# Patient Record
Sex: Female | Born: 1955 | Race: Black or African American | Hispanic: No | Marital: Married | Smoking: Never smoker
Health system: Southern US, Community
[De-identification: ages and names within clinical notes are randomized; demographics above are authoritative.]

## PROBLEM LIST (undated history)

## (undated) DIAGNOSIS — E079 Disorder of thyroid, unspecified: Secondary | ICD-10-CM

## (undated) DIAGNOSIS — E785 Hyperlipidemia, unspecified: Secondary | ICD-10-CM

## (undated) DIAGNOSIS — M329 Systemic lupus erythematosus, unspecified: Secondary | ICD-10-CM

## (undated) DIAGNOSIS — I10 Essential (primary) hypertension: Secondary | ICD-10-CM

## (undated) DIAGNOSIS — I4891 Unspecified atrial fibrillation: Secondary | ICD-10-CM

## (undated) DIAGNOSIS — I272 Pulmonary hypertension, unspecified: Secondary | ICD-10-CM

## (undated) DIAGNOSIS — G4730 Sleep apnea, unspecified: Secondary | ICD-10-CM

## (undated) DIAGNOSIS — IMO0002 Reserved for concepts with insufficient information to code with codable children: Secondary | ICD-10-CM

## (undated) DIAGNOSIS — N289 Disorder of kidney and ureter, unspecified: Secondary | ICD-10-CM

## (undated) HISTORY — PX: OTHER SURGICAL HISTORY: SHX169

## (undated) HISTORY — PX: CARDIAC CATHETERIZATION: SHX172

## (undated) HISTORY — PX: ABDOMINAL HYSTERECTOMY: SHX81

## (undated) HISTORY — DX: Hyperlipidemia, unspecified: E78.5

## (undated) HISTORY — DX: Pulmonary hypertension, unspecified: I27.20

---

## 2000-07-04 ENCOUNTER — Encounter: Payer: Self-pay | Admitting: Family Medicine

## 2000-07-04 ENCOUNTER — Ambulatory Visit (HOSPITAL_COMMUNITY): Admission: RE | Admit: 2000-07-04 | Discharge: 2000-07-04 | Payer: Self-pay | Admitting: Family Medicine

## 2002-08-10 ENCOUNTER — Ambulatory Visit (HOSPITAL_COMMUNITY): Admission: RE | Admit: 2002-08-10 | Discharge: 2002-08-10 | Payer: Self-pay | Admitting: Family Medicine

## 2002-08-10 ENCOUNTER — Encounter: Payer: Self-pay | Admitting: Family Medicine

## 2006-04-23 DIAGNOSIS — G4730 Sleep apnea, unspecified: Secondary | ICD-10-CM

## 2006-04-23 HISTORY — DX: Sleep apnea, unspecified: G47.30

## 2006-09-02 ENCOUNTER — Ambulatory Visit: Payer: Self-pay | Admitting: Family Medicine

## 2006-09-05 ENCOUNTER — Encounter: Admission: RE | Admit: 2006-09-05 | Discharge: 2006-09-05 | Payer: Self-pay | Admitting: Family Medicine

## 2006-09-10 ENCOUNTER — Ambulatory Visit: Payer: Self-pay | Admitting: Family Medicine

## 2006-09-29 ENCOUNTER — Inpatient Hospital Stay (HOSPITAL_COMMUNITY): Admission: EM | Admit: 2006-09-29 | Discharge: 2006-10-03 | Payer: Self-pay | Admitting: Emergency Medicine

## 2006-10-01 ENCOUNTER — Ambulatory Visit: Payer: Self-pay | Admitting: Vascular Surgery

## 2006-10-01 ENCOUNTER — Encounter (INDEPENDENT_AMBULATORY_CARE_PROVIDER_SITE_OTHER): Payer: Self-pay | Admitting: Internal Medicine

## 2006-10-02 HISTORY — PX: CARDIAC CATHETERIZATION: SHX172

## 2006-10-11 ENCOUNTER — Emergency Department (HOSPITAL_COMMUNITY): Admission: EM | Admit: 2006-10-11 | Discharge: 2006-10-11 | Payer: Self-pay | Admitting: Emergency Medicine

## 2007-01-09 ENCOUNTER — Encounter: Admission: RE | Admit: 2007-01-09 | Discharge: 2007-01-09 | Payer: Self-pay | Admitting: Internal Medicine

## 2007-01-11 ENCOUNTER — Encounter: Admission: RE | Admit: 2007-01-11 | Discharge: 2007-01-11 | Payer: Self-pay | Admitting: Internal Medicine

## 2007-01-24 ENCOUNTER — Encounter: Admission: RE | Admit: 2007-01-24 | Discharge: 2007-01-24 | Payer: Self-pay | Admitting: Internal Medicine

## 2007-02-24 ENCOUNTER — Emergency Department (HOSPITAL_COMMUNITY): Admission: EM | Admit: 2007-02-24 | Discharge: 2007-02-24 | Payer: Self-pay | Admitting: Emergency Medicine

## 2007-04-03 ENCOUNTER — Encounter (HOSPITAL_COMMUNITY): Admission: RE | Admit: 2007-04-03 | Discharge: 2007-04-23 | Payer: Self-pay | Admitting: Cardiovascular Disease

## 2007-04-24 ENCOUNTER — Encounter (HOSPITAL_COMMUNITY): Admission: RE | Admit: 2007-04-24 | Discharge: 2007-07-23 | Payer: Self-pay | Admitting: Cardiovascular Disease

## 2007-06-02 ENCOUNTER — Observation Stay (HOSPITAL_COMMUNITY): Admission: EM | Admit: 2007-06-02 | Discharge: 2007-06-04 | Payer: Self-pay | Admitting: Emergency Medicine

## 2007-06-03 ENCOUNTER — Encounter (INDEPENDENT_AMBULATORY_CARE_PROVIDER_SITE_OTHER): Payer: Self-pay | Admitting: *Deleted

## 2007-11-05 ENCOUNTER — Emergency Department (HOSPITAL_COMMUNITY): Admission: EM | Admit: 2007-11-05 | Discharge: 2007-11-06 | Payer: Self-pay | Admitting: Emergency Medicine

## 2008-02-26 ENCOUNTER — Encounter (INDEPENDENT_AMBULATORY_CARE_PROVIDER_SITE_OTHER): Payer: Self-pay | Admitting: Obstetrics & Gynecology

## 2008-02-26 ENCOUNTER — Ambulatory Visit (HOSPITAL_COMMUNITY): Admission: RE | Admit: 2008-02-26 | Discharge: 2008-02-27 | Payer: Self-pay | Admitting: Obstetrics & Gynecology

## 2008-03-13 ENCOUNTER — Inpatient Hospital Stay (HOSPITAL_COMMUNITY): Admission: AD | Admit: 2008-03-13 | Discharge: 2008-03-13 | Payer: Self-pay | Admitting: Obstetrics and Gynecology

## 2008-03-17 ENCOUNTER — Inpatient Hospital Stay (HOSPITAL_COMMUNITY): Admission: AD | Admit: 2008-03-17 | Discharge: 2008-03-17 | Payer: Self-pay | Admitting: Obstetrics and Gynecology

## 2008-05-17 ENCOUNTER — Emergency Department (HOSPITAL_COMMUNITY): Admission: EM | Admit: 2008-05-17 | Discharge: 2008-05-17 | Payer: Self-pay | Admitting: Emergency Medicine

## 2008-10-08 ENCOUNTER — Ambulatory Visit: Payer: Self-pay | Admitting: Diagnostic Radiology

## 2008-10-08 ENCOUNTER — Emergency Department (HOSPITAL_BASED_OUTPATIENT_CLINIC_OR_DEPARTMENT_OTHER): Admission: EM | Admit: 2008-10-08 | Discharge: 2008-10-09 | Payer: Self-pay | Admitting: Emergency Medicine

## 2009-09-12 ENCOUNTER — Encounter: Payer: Self-pay | Admitting: Emergency Medicine

## 2009-09-12 ENCOUNTER — Ambulatory Visit: Payer: Self-pay | Admitting: Radiology

## 2009-09-12 ENCOUNTER — Inpatient Hospital Stay (HOSPITAL_COMMUNITY): Admission: AD | Admit: 2009-09-12 | Discharge: 2009-09-15 | Payer: Self-pay | Admitting: Cardiovascular Disease

## 2009-09-13 ENCOUNTER — Encounter (INDEPENDENT_AMBULATORY_CARE_PROVIDER_SITE_OTHER): Payer: Self-pay | Admitting: Cardiovascular Disease

## 2009-09-13 ENCOUNTER — Ambulatory Visit: Payer: Self-pay | Admitting: Vascular Surgery

## 2010-07-10 LAB — URINALYSIS, ROUTINE W REFLEX MICROSCOPIC
Glucose, UA: NEGATIVE mg/dL
Hgb urine dipstick: NEGATIVE
Nitrite: NEGATIVE
Urobilinogen, UA: 0.2 mg/dL (ref 0.0–1.0)

## 2010-07-10 LAB — BASIC METABOLIC PANEL
BUN: 13 mg/dL (ref 6–23)
BUN: 17 mg/dL (ref 6–23)
CO2: 33 mEq/L — ABNORMAL HIGH (ref 19–32)
Calcium: 8.8 mg/dL (ref 8.4–10.5)
Chloride: 110 mEq/L (ref 96–112)
Chloride: 104 mEq/L (ref 96–112)
Creatinine, Ser: 1.2 mg/dL (ref 0.4–1.2)
Creatinine, Ser: 1.3 mg/dL — ABNORMAL HIGH (ref 0.4–1.2)
GFR calc Af Amer: 57 mL/min — ABNORMAL LOW (ref >60)
GFR calc non Af Amer: 52 mL/min — ABNORMAL LOW (ref >60)
Glucose, Bld: 96 mg/dL (ref 70–99)
Potassium: 4.5 mEq/L (ref 3.5–5.1)
Potassium: 4.8 mEq/L (ref 3.5–5.1)
Sodium: 140 mEq/L (ref 135–145)
Sodium: 143 mEq/L (ref 135–145)

## 2010-07-10 LAB — POCT I-STAT 3, VENOUS BLOOD GAS (G3P V)
Bicarbonate: 23.4 mEq/L (ref 20.0–24.0)
pCO2, Ven: 41.5 mmHg — ABNORMAL LOW (ref 45.0–50.0)
pH, Ven: 7.359 — ABNORMAL HIGH (ref 7.250–7.300)
pO2, Ven: 34 mmHg (ref 30.0–45.0)

## 2010-07-10 LAB — LIPID PANEL
HDL: 39 mg/dL — ABNORMAL LOW (ref >39)
LDL Cholesterol: 56 mg/dL (ref 0–99)
Total CHOL/HDL Ratio: 2.9 RATIO
Triglycerides: 86 mg/dL (ref <150)
VLDL: 17 mg/dL (ref 0–40)

## 2010-07-10 LAB — CBC
Hemoglobin: 9.8 g/dL — ABNORMAL LOW (ref 12.0–15.0)
MCHC: 34.4 g/dL (ref 30.0–36.0)
MCV: 94.5 fL (ref 78.0–100.0)
MCV: 93.1 fL (ref 78.0–100.0)
Platelets: 263 10*3/uL (ref 150–400)
Platelets: 265 10*3/uL (ref 150–400)
Platelets: 312 10*3/uL (ref 150–400)
RBC: 3.03 MIL/uL — ABNORMAL LOW (ref 3.87–5.11)
RDW: 13.3 % (ref 11.5–15.5)
RDW: 13.4 % (ref 11.5–15.5)
WBC: 3.7 10*3/uL — ABNORMAL LOW (ref 4.0–10.5)
WBC: 3.9 10*3/uL — ABNORMAL LOW (ref 4.0–10.5)
WBC: 4 10*3/uL (ref 4.0–10.5)
WBC: 4 10*3/uL (ref 4.0–10.5)

## 2010-07-10 LAB — COMPREHENSIVE METABOLIC PANEL
ALT: 17 U/L (ref 0–35)
AST: 16 U/L (ref 0–37)
Albumin: 3.3 g/dL — ABNORMAL LOW (ref 3.5–5.2)
Alkaline Phosphatase: 46 U/L (ref 39–117)
GFR calc Af Amer: 56 mL/min — ABNORMAL LOW (ref >60)
Potassium: 3.5 mEq/L (ref 3.5–5.1)
Sodium: 141 mEq/L (ref 135–145)
Total Protein: 6.2 g/dL (ref 6.0–8.3)

## 2010-07-10 LAB — PROTIME-INR
INR: 1.21 (ref 0.00–1.49)
INR: 1.61 — ABNORMAL HIGH (ref 0.00–1.49)
INR: 1.82 — ABNORMAL HIGH (ref 0.00–1.49)
INR: 1.6 — ABNORMAL HIGH (ref 0.00–1.49)
Prothrombin Time: 19 seconds — ABNORMAL HIGH (ref 11.6–15.2)
Prothrombin Time: 20.9 seconds — ABNORMAL HIGH (ref 11.6–15.2)
Prothrombin Time: 18.9 seconds — ABNORMAL HIGH (ref 11.6–15.2)

## 2010-07-10 LAB — HEMOGLOBIN A1C: Mean Plasma Glucose: 97 mg/dL (ref <117)

## 2010-07-10 LAB — HEPARIN LEVEL (UNFRACTIONATED)
Heparin Unfractionated: 0.18 IU/mL — ABNORMAL LOW (ref 0.30–0.70)
Heparin Unfractionated: 0.37 IU/mL (ref 0.30–0.70)
Heparin Unfractionated: 0.5 IU/mL (ref 0.30–0.70)

## 2010-07-10 LAB — CARDIAC PANEL(CRET KIN+CKTOT+MB+TROPI)
CK, MB: 0.4 ng/mL (ref 0.3–4.0)
Total CK: 105 U/L (ref 7–177)
Total CK: 93 U/L (ref 7–177)
Troponin I: 0.01 ng/mL (ref 0.00–0.06)

## 2010-07-10 LAB — URINE MICROSCOPIC-ADD ON

## 2010-07-10 LAB — C-REACTIVE PROTEIN: CRP: 0.3 mg/dL — ABNORMAL LOW (ref <0.6)

## 2010-07-10 LAB — POCT I-STAT 3, ART BLOOD GAS (G3+)
Acid-base deficit: 1 mmol/L (ref 0.0–2.0)
pH, Arterial: 7.39 (ref 7.350–7.400)

## 2010-07-10 LAB — DIFFERENTIAL
Basophils Relative: 1 % (ref 0–1)
Eosinophils Absolute: 0.1 10*3/uL (ref 0.0–0.7)
Neutrophils Relative %: 53 % (ref 43–77)

## 2010-07-10 LAB — ANA: Anti Nuclear Antibody(ANA): NEGATIVE

## 2010-07-10 LAB — SEDIMENTATION RATE: Sed Rate: 19 mm/hr (ref 0–22)

## 2010-07-10 LAB — CREATININE, URINE, RANDOM: Creatinine, Urine: 193.1 mg/dL

## 2010-07-10 LAB — D-DIMER, QUANTITATIVE: D-Dimer, Quant: 0.37 ug/mL-FEU (ref 0.00–0.48)

## 2010-07-10 LAB — TSH: TSH: 1.769 u[IU]/mL (ref 0.350–4.500)

## 2010-07-31 LAB — PROTIME-INR
INR: 2.7 — ABNORMAL HIGH (ref 0.00–1.49)
Prothrombin Time: 30.3 s — ABNORMAL HIGH (ref 11.6–15.2)

## 2010-07-31 LAB — DIFFERENTIAL
Basophils Absolute: 0 K/uL (ref 0.0–0.1)
Basophils Relative: 1 % (ref 0–1)
Eosinophils Absolute: 0.1 10*3/uL (ref 0.0–0.7)
Eosinophils Relative: 1 % (ref 0–5)
Lymphocytes Relative: 44 % (ref 12–46)
Lymphs Abs: 1.8 10*3/uL (ref 0.7–4.0)
Monocytes Absolute: 0.4 10*3/uL (ref 0.1–1.0)
Monocytes Relative: 9 % (ref 3–12)
Neutro Abs: 1.8 K/uL (ref 1.7–7.7)
Neutrophils Relative %: 45 % (ref 43–77)

## 2010-07-31 LAB — URINALYSIS, ROUTINE W REFLEX MICROSCOPIC
Bilirubin Urine: NEGATIVE
Glucose, UA: NEGATIVE mg/dL
Hgb urine dipstick: NEGATIVE
Ketones, ur: NEGATIVE mg/dL
Nitrite: NEGATIVE
Protein, ur: NEGATIVE mg/dL
Specific Gravity, Urine: 1.021 (ref 1.005–1.030)
Urobilinogen, UA: 0.2 mg/dL (ref 0.0–1.0)
pH: 5.5 (ref 5.0–8.0)

## 2010-07-31 LAB — COMPREHENSIVE METABOLIC PANEL WITH GFR
ALT: 20 U/L (ref 0–35)
Albumin: 4.3 g/dL (ref 3.5–5.2)
Alkaline Phosphatase: 90 U/L (ref 39–117)
Calcium: 9.1 mg/dL (ref 8.4–10.5)
GFR calc Af Amer: 57 mL/min — ABNORMAL LOW (ref >60)
Glucose, Bld: 94 mg/dL (ref 70–99)
Potassium: 3.5 meq/L (ref 3.5–5.1)
Sodium: 141 meq/L (ref 135–145)
Total Protein: 7.9 g/dL (ref 6.0–8.3)

## 2010-07-31 LAB — CBC
HCT: 34.1 % — ABNORMAL LOW (ref 36.0–46.0)
Hemoglobin: 12.1 g/dL (ref 12.0–15.0)
MCHC: 35.3 g/dL (ref 30.0–36.0)
MCV: 90.9 fL (ref 78.0–100.0)
Platelets: 348 K/uL (ref 150–400)
RBC: 3.75 MIL/uL — ABNORMAL LOW (ref 3.87–5.11)
RDW: 13.1 % (ref 11.5–15.5)
WBC: 4.1 10*3/uL (ref 4.0–10.5)

## 2010-07-31 LAB — POCT B-TYPE NATRIURETIC PEPTIDE (BNP): B Natriuretic Peptide, POC: 7.6 pg/mL (ref 0–100)

## 2010-07-31 LAB — COMPREHENSIVE METABOLIC PANEL
AST: 27 U/L (ref 0–37)
BUN: 23 mg/dL (ref 6–23)
CO2: 32 mEq/L (ref 19–32)
Chloride: 98 mEq/L (ref 96–112)
Creatinine, Ser: 1.2 mg/dL (ref 0.4–1.2)
GFR calc non Af Amer: 47 mL/min — ABNORMAL LOW (ref >60)
Total Bilirubin: 0.4 mg/dL (ref 0.3–1.2)

## 2010-07-31 LAB — POCT CARDIAC MARKERS
CKMB, poc: 1.1 ng/mL (ref 1.0–8.0)
Myoglobin, poc: 40.7 ng/mL (ref 12–200)
Troponin i, poc: <0.05 ng/mL (ref 0.00–0.09)

## 2010-07-31 LAB — APTT: aPTT: 53 seconds — ABNORMAL HIGH (ref 24–37)

## 2010-08-07 LAB — POCT I-STAT, CHEM 8
Chloride: 103 mEq/L (ref 96–112)
HCT: 37 % (ref 36.0–46.0)
Hemoglobin: 12.6 g/dL (ref 12.0–15.0)
Potassium: 3.6 mEq/L (ref 3.5–5.1)
Sodium: 141 mEq/L (ref 135–145)

## 2010-09-05 NOTE — H&P (Signed)
NAME:  Caitlyn Fuentes, Caitlyn Fuentes NO.:  [id number]   MEDICAL RECORD NO.:  [id number]          PATIENT TYPE:  AMB   LOCATION:  [place]                           FACILITY:  [facility]   PHYSICIAN:  Maisie Fus, M.D.   DATE OF BIRTH:  Apr 21, 1956   DATE OF ADMISSION:  02/26/2008  DATE OF DISCHARGE:                              HISTORY & PHYSICAL   ADMITTING DIAGNOSES:  1. Dysfunctional uterine bleeding.  2. Chronic pelvic pain.  3. Ultrasound findings of uterine leiomyoma and adenomyosis.   The patient is a 55 year old black married female, gravida 4, para 4,  two by cesarean, two vaginally, who presented to my office in May 2009,  complaining of pelvic pain.  She also further complained of irregular  painful urination.  Additional evaluation in the office included a  pelvic ultrasound which showed a uterine leiomyoma and showed cystic  areas within the myometrium consistent with adenomyosis.  There was a  simple appearing right ovarian cyst noted at that time also.  By clinic  exam, there was no palpable enlargement of the uterus.  There was mild  left adnexal tenderness.  There was good vaginal support with a first  degree rectocele, but good anterior support.  There were no palpable  adnexal masses or rectal masses palpable and the vagina and cervix were  normal, as well as were the external genitalia.  Based on her symptoms  and the options of therapy which have been discussed are endometrial  ablation, not considered ideal for her condition, intrauterine device  and laparoscopically assisted hysterectomy.  She has chosen the latter,  wishing to have definitive intervention.  She is admitted at this time  for laparoscopically assisted vaginal hysterectomy and bilateral  salpingo-oophorectomy.   REVIEW OF SYSTEMS:  Her current review of systems is essentially  unremarkable.  There are no significant cardiopulmonary symptoms or  complaints.  She does admit to occasional  palpitations.  There are mild  GU symptoms of occasional stress urinary incontinence and urge  incontinence.  No other notable complaints.   PAST MEDICAL HISTORY:  The patient is known to have had atrial  fibrillation.  She is followed for this problem by Dr. Ellouise Newer.  Consultation with Dr. Claiborne Billings approximately 2 weeks prior to admission  revealed that in September 2009, and in February 2009, when she was seen  in the office, she was at that time in normal sinus rhythm.  Her last  echocardiogram was in July 2009, and she was found to have 50-55% left  ventricular ejection fraction.  By his consultation, he felt it was safe  to discontinue her Coumadin, which was discontinued approximately 10  days prior to this admission.  In addition, she was to discontinue her  aspirin.  Her other known medical condition is hypertension.  Her  cardiac and antihypertensive meds include aldactone/Diovan 80 mg a day,  Coreg 40 mg a day, torsemide 20 mg a day, metoclopramide 25 mg as needed  for rapid heart rate.  Additional medications include Ambien 10 mg at  bedtime as needed, Vesicare 10 mg a day for irritable bladder  and  meclizine 25 mg p.r.n.  She also has alprazolam or Xanax 0.5 mg which  she takes on a p.r.n. basis.  She is not a cigarette smoker.  NO KNOWN  ALLERGIES TO MEDICATIONS.  She is a one-to-two drinks per day alcohol  user.   FAMILY HISTORY:  Noncontributory.   PHYSICAL EXAMINATION:  HEENT:  Grossly within normal limits.  VITAL SIGNS:  Blood pressure in the office was 112/78.  NECK:  Thyroid gland is not palpably enlarged to my examination.  CHEST:  Clear to auscultation throughout.  HEART:  At the time of my exam was normal sinus rhythm, no audible  murmurs, rubs or gallops.  BREASTS:  Considered normal and remarkable for augmentation mammoplasty.  ABDOMEN:  Soft, moderately obese without tenderness, without appreciable  organomegaly or palpable masses.  EXTREMITIES:  Without  clubbing, cyanosis or edema.  PELVIC:  Findings are described above.   ASSESSMENT:  1. Uterine leiomyoma.  2. 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  3. Chronic pelvic pain.  4. Dysfunctional uterine bleeding.   PLAN:  Laparoscopically assisted vaginal hysterectomy, bilateral  salpingo-oophorectomy.  The patient has reviewed an ACOG brochure on the  procedure, including potential risks.  This has been discussed with her  and all questions answered.  Those risks include risk of infection, risk  of intraoperative or postoperative hemorrhage, risk of injury to other  pelvic or abdominal organs.  She also understands that on occasion the  procedure must be converted to an abdominal procedure.  She is now  admitted to proceed with surgery.      Maisie Fus, M.D.  Electronically Signed     WRN/MEDQ  D:  02/25/2008  T:  02/25/2008  Job:  [id number]

## 2010-09-05 NOTE — Discharge Summary (Signed)
NAME:  Caitlyn Fuentes, Caitlyn Fuentes           ACCOUNT NO.:  [id number]   MEDICAL RECORD NO.:  [id number]          PATIENT TYPE:  INP   LOCATION:  3028                         FACILITY:  [facility]   PHYSICIAN:  Pramod P. Leonie Man, MD    DATE OF BIRTH:  Jan 13, 1956   DATE OF ADMISSION:  06/02/2007  DATE OF DISCHARGE:  06/04/2007                               DISCHARGE SUMMARY   DIAGNOSES AT TIME OF DISCHARGE:  1. Transient left-sided weakness and aphasia, now resolved in the      setting of negative MRI.  Etiology unknown, though likely stress/      conversion reaction.  2. History of paroxysmal atrial fibrillation on Coumadin prior to      admission.  3. Hypertension.  4. Congestive heart failure.  5. Fibromyalgia.  6. Nonischemic cardiomyopathy, unchanged since July of 2008.  7. Obstructive sleep apnea with CPAP at h.s.   MEDICINES AT TIME OF DISCHARGE:  1. Aldactone 25 mg a day.  2. Coreg 25 mg b.i.d.  3. Diovan 80 mg q.h.s.  4. Torsemide 20 mg q.a.m.  5. Coumadin 10.5 mg q.p.m.  6. Vesicare 10 mg q.a.m.  7. Ambien 10 mg p.r.n. q.h.s.  8. Meclizine 25 mg t.i.d. p.r.n.  9. Reglan 10 mg q.4h. p.r.n.  10.Lanoxin 0.25 mg a day.  11.Aspirin 81 mg a day.   STUDIES PERFORMED:  1. CT of brain on admission shows no acute abnormality.  2. MRI of the brain shows no acute stroke.  3. MRA of the brain shows no acute abnormality, no stenosis.  4. EKG shows normal sinus rhythm.  5. Carotid Doppler shows proximal ICA with 40-60% stenosis per      velocity but no plaque to support morphology.  No significant      stenosis.  6. Transcranial Doppler performed; results pending.  7. 2-D echocardiogram shows EF of 35-45% with no source of embolus;      unchanged since July of 2008.  8. EEG with no seizure activity.   LABORATORY STUDIES:  CBC with hemoglobin 10.7, hematocrit 31.3;  otherwise normal.  Chemistry with creatinine 1.2.  Coags with INR of 3.0  on admission, 3.4 at discharge.  Cholesterol 170,  triglycerides 212, HDL  24, LDL 104.  Homocystine 19.8.  Cardiac enzymes with CK-MB less than 1;  otherwise normal.  Alcohol level less than 5.  Chemistry not performed  other than the creatinine.   HISTORY OF PRESENT ILLNESS:  Caitlyn Fuentes is a 55 year old  right-handed African-American female with history of multiple medical  problems including fibromyalgia who presents with acute onset of  dysphagia and dysarthria at 1:30 p.m. the day of admission, followed by  general weakness.  The patient states she felt more heavy on her left  than her right.  Her symptoms gradually improved then resolved, except  for some slight left-sided numbness.  The patient also complained of  lightheadedness.  She was within the TPA window, but secondary to  symptom resolution she was not a candidate.  She was admitted to the  hospital for further stroke evaluation.   HOSPITAL COURSE:  MRI was  negative for acute stroke.  The patient does  have a history of paroxysmal atrial fibrillation and was on Coumadin  prior to admission.  She had an INR of 3.1.  Her episodes of aphasia and  dysarthria occurred while at work during a meeting. but were transient  and lasted only a few minutes.  The story was not consistent with a  stroke or TIA-sounding symptoms.  Of note, the patient has a family  history of Huntington's and constantly referred her symptoms back to  that disease process.  Complete stroke work up was done including EEG  that ruled out seizures.  Aspirin was added for stroke prevention.  In  hospital, the patient had right bundle branch block and a run of  bigeminy.  The cardiologist was asked to evaluate her, as she has had  cardiac abnormalities in the past.  [facility] saw her and  felt her 2-D echocardiogram was unchanged since July of 2008.  She had  complete normal sinus rhythm and no other abnormalities.  She was safe  to be discharged home from their standpoint and followed  up as an  outpatient.  They did recommend to decrease her Diovan secondary to  slightly low blood pressure, and that was done.  The patient has no PT,  OT needs and was safe for discharge home.  She may return to work on  Monday.  She has no neuro follow up needed.   CONDITION ON DISCHARGE:  The patient alert and oriented x3.  No diffuse  weakness, no aphasia, no dysarthria.  Gait steady.   DISCHARGE/PLAN:  1. Discharged home with family.  2. Follow up with primary care physician within 1 month.  3. Follow up with outpatient cardiology per their recommendation.  4. Encourage stress relaxation techniques.  5. No neurologic follow up needed.      Burnetta Sabin, N.P.    ______________________________  Kathie Rhodes. Leonie Man, MD    SB/MEDQ  D:  06/04/2007  T:  06/05/2007  Job:  [id number]   cc:   Barbette Merino, M.D.  Shelva Majestic, M.D.

## 2010-09-05 NOTE — Discharge Summary (Signed)
NAMEMarland Fuentes  [name], [name] NO.:  [id number]   MEDICAL RECORD NO.:  [id number]          PATIENT TYPE:  INP   LOCATION:  [id number]                         FACILITY:  [facility]   PHYSICIAN:  Sharlet Salina, M.D.   DATE OF BIRTH:  [birth year]   DATE OF ADMISSION:  09/29/2006  DATE OF DISCHARGE:  10/03/2006                               DISCHARGE SUMMARY   DISCHARGE DIAGNOSIS:  1. Near syncope.  2. New onset atrial fibrillation.  3. Hypertension.  4. Fibromyalgia.  5. Goiter.   DISCHARGE MEDICATIONS:  1. Coumadin 5 mg daily.  2. Cymbalta 60 mg daily.  3. Lisinopril 5 mg daily.  4. Coreg 3.125 mg twice daily.   PROCEDURES:  1. Cardiac catheterization that showed normal coronaries with a      decreased ejection fraction.  2. A  2-D echocardiogram that showed an ejection fraction of 35-45%.  3. Carotid Dopplers negative for ICA stenosis.   CONSULTANTS:  Cardiology. [facility], Dr. Claiborne Billings.  See HPI.   FOLLOWUP APPOINTMENTS:  The patient to follow up on Monday for INR check  and to follow up with Dr. Gwenlyn Found.  Office will call her with an  appointment.   HISTORY OF PRESENT ILLNESS:  The patient is a 55 year old female.  Was  at a bowling alley and after bowling tonight had a  near syncopal  episode.  According to the patient, she was bowling and was very hot and  her clothes soaking wet from sweating.  Went to the bathroom and felt  faint.  She states that she walked back to the outside and spoke to her  husband and according to her husband she had an  episode where she  almost passed out. See H&P for details.   Past medical history, family history, social history, meds, allergies,  review of systems per admission H&P.   PHYSICAL EXAMINATION ON DISCHARGE:  VITAL SIGNS:  Temperature 97.7,  pulse 75, respirations 20, blood pressure 126/88, pulse ox 97 and on  room air.  HEENT:  Head is normocephalic, atraumatic.  Pupils reactive to light  without  erythema.  CARDIOVASCULAR:  Regular rhythm.  LUNGS:  Clear bilaterally.  ABDOMEN:  Positive bowel sounds without edema.   HOSPITAL COURSE:  1. Near syncopal episode:  The patient was admitted to the hospital      and was noted to be in atrial fibrillation.  She was monitored on      the monitor.  Cardiology was consulted.  She had cardiac      catheterization that showed clean coronaries, but decrease in LV      function, most likely secondary to hypertension.  The patient was      discharged home on heart failure therapy with ACE inhibitor, beta      blocker and Coumadin for her atrial fibrillation.  2. Hypertension:  As above, the patient was treated with Coreg and      lisinopril for blood pressure.  3. Fibromyalgia:  She was continued on Cymbalta.   DISCHARGE LABORATORY DATA:  Sodium 138, potassium 3.7, chloride 106, CO2  25, glucose 92, BUN  10, creatinine 0.86.  PT 14.8, INR 1.1, WBC 2.9,  hemoglobin 11.8, platelets 360,000.  Magnesium 2.1, TSH 1.713.  Cardiac  enzymes negative.  CK 159. MB 1.9, relative index 1.2, troponin-I  0.03.  D-dimer 0.59.   CT angiogram of the chest showed no evidence of pulmonary emboli,  suboptimal opacification of the pulmonary arteries. Normal  thoracic  aorta.  No acute pulmonary findings.      Sharlet Salina, M.D.  Electronically Signed     N[place]/MEDQ  D:  10/03/2006  T:  10/04/2006  Job:  [id number]   cc:   Quay Burow, M.D.  Barbette Merino, M.D.

## 2010-09-05 NOTE — Procedures (Signed)
EEG NUMBER:  [id number]   CLINICAL HISTORY:  This is a 55 year old lady with episodes of syncope  with left-sided weakness.   MEDICATION:  Listed as aspirin, Coumadin, Aldactone, Coreg, Avapro,  Demadex, Enablex, Ambien, Reglan and Tylenol.   TECHNICAL DESCRIPTION:  This is a routine 17-channel EEG recorded with  the patient awake and asleep using standard 10/20 electrode placement.   Background awake rhythm consists of 9- to 10-Hz alpha which is of  moderate amplitude, synchronous, reactive to eye-opening and closure.  No paroxysmal epileptiform activity, spikes or sharp waves are seen.  Stages of light sleep are only seen and show no significant  abnormalities.  Hyperventilation and photic stimulation are both  unremarkable.  Length of the recording is 23.4 minutes.  Technical  component is average.  EKG tracing reveals regular sinus rhythm.   IMPRESSION:  This EEG performed during awake and light sleep states is  within normal limits.  No definite epileptiform features were noted.           ______________________________  Kathie Rhodes. Leonie Man, MD     [id number]  D:  06/03/2007 18:48:50  T:  06/05/2007 09:55:21  Job #:  [id number]

## 2010-09-05 NOTE — H&P (Signed)
NAMEMarland Kitchen  JAZZLEEN, SCHINK NO.:  [id number]   MEDICAL RECORD NO.:  [id number]          PATIENT TYPE:  INP   LOCATION:  3028                         FACILITY:  [facility]   PHYSICIAN:  Shaune Pascal. Champey, M.D.DATE OF BIRTH:  [birth year]   DATE OF ADMISSION:  06/02/2007  DATE OF DISCHARGE:                              HISTORY & PHYSICAL   REASON FOR ADMISSION:  Stroke.   REQUESTING PHYSICIANS:  Dr. Zenia Resides   HISTORY OF PRESENT ILLNESS:  Ms. Alamin is a 55 year old African-  American female with multiple medical problems who presents with acute  onset of aphagia and dysarthria starting around 1:30 p.m. today; this  was followed by general weakness, and patient felt more heavy on her  left side than her right.  Her symptoms gradually improved and resolved,  but the patient still has some slight left-sided numbness.  She denies  any headache, vision changes,  swallowing problems, chewing problems, or  loss of consciousness.  Her speech is back to normal.  Patient also  complains of  lightheadedness today as well.   PAST MEDICAL HISTORY:  Positive for hypertension, CHF, atrial  fibrillation, fibromyalgia.   CURRENT MEDICATIONS:  Coumadin, aldactone, carvedilol, Diovan, digoxin,  Vesicare, meclizine, Ambien, and Reglan.   ALLERGIES:  No known drug allergies.   FAMILY HISTORY:  Positive for heart disease, diabetes, hypertension.   SOCIAL HISTORY:  The patient lives with her husband.  Denis any tobacco  or alcohol or drug use.   REVIEW OF SYSTEMS:  Positive as per HPI.  Negative as per HPI  and  greater than 6 other organ systems.   PHYSICAL EXAMINATION:  VITAL SIGNS:  Blood pressure 134/56.  Pulse 66.  Respirations 26.  O2 sats 100%.  HEENT:  Normocephalic, atraumatic.  Extraocular movements intact.  Pupils equal round and react to light.  NECK:  Supple.  HEART:  Regular.  LUNGS:  Clear.  ABDOMEN:  Soft, nontender.  EXTREMITIES:  No edema, good pulses.  NEUROLOGIC:   The patient is awake, alert, and oriented.  Language is  fluent. The patient follows commands appropriately.  Cranial nerves II  through XII are grossly intact.  Motor examination shows 4+ to 5/5  strength throughout except for patient has slight left lower extremity  weakness at 4 to 4+/5 strength.  Patient has a little slight drift in  the left lower extremity.  SENSORY:  The patient has decreased sensation  to pinprick in the left upper extremity and face when compared to the  right.  Reflexes are trace to 1+ throughout and symmetric.  CEREBELLAR FUNCTION:  No ataxia.  Gait was not assessed secondary to  safety.   LABORATORY DATA:  PT is 32.2, INR 3.0, PTT 46.6.   CT of the head showed no acute abnormalities.   ASSESSMENT/PLAN:  The patient is a 55 year old African-American female  with transient aphasia, which has resolved; left-sided weakness/numbness  which greatly improved.  Her NIH stroke scale is 2.  The patient is not  a candidate for IV t-PA.  Her symptoms are greatly improved.  We will  admit the patient to  stroke  MD service.  We will complete a stroke  workup.  We will continue her on Coumadin with goal of INR of 2-3.  We  will add a baby aspirin per day to her Coumadin, as her INR is  therapeutic.  We will get PT and OT consults and continue her on her  other home medications.      Shaune Pascal. Estella Husk, M.D.  Electronically Signed     DRC/MEDQ  D:  06/02/2007  T:  06/03/2007  Job:  [id number]

## 2010-09-05 NOTE — Op Note (Signed)
NAME:  Caitlyn Fuentes, Caitlyn Fuentes NO.:  [id number]   MEDICAL RECORD NO.:  [id number]          PATIENT TYPE:  OIB   LOCATION:  9313                          FACILITY:  [facility]   PHYSICIAN:  Maisie Fus, M.D.   DATE OF BIRTH:  1955-07-27   DATE OF PROCEDURE:  02/26/2008  DATE OF DISCHARGE:                               OPERATIVE REPORT   PREOPERATIVE DIAGNOSES:  Uterine leiomyomata, probable uterine  adenomyosis, clinical symptoms of chronic pelvic pain and dysfunctional  uterine bleeding.   POSTOPERATIVE DIAGNOSES:  Uterine leiomyomata, probable uterine  adenomyosis, clinical symptoms of chronic pelvic pain and dysfunctional  uterine bleeding.   OPERATIVE PROCEDURE:  Laparoscopic-assisted vaginal hysterectomy,  bilateral salpingo-oophorectomy.   SURGEON:  Maisie Fus, M.D.   ASSISTANT:  Erik Obey. Grewal, M.D.   ESTIMATED INTRAOPERATIVE BLOOD LOSS:  150 mL.   ANESTHESIA:  General endotracheal.   INTRAOPERATIVE COMPLICATIONS:  None.   Details of present illness recorded the admission note.  The patient was  admitted on the morning of surgery.   DESCRIPTION OF PROCEDURE:  She was given a gram of Ancef IV.  She was  placed in PAS compression hose.  She was brought to the operating room  and there placed under adequate general endotracheal anesthesia, placed  in dorsal lithotomy position using the Allen stirrup system.  Betadine  prep using scrub followed by a solution was done of the abdomen,  perineum, mons and vagina.  Bladder was evacuated with sterile  technique.  A Hulka tenaculum was attached to the cervix under direct  visualization.  Sterile drapes were applied.  Two small incisions were  made in the abdomen, one at the umbilicus and one just above the  symphysis in the midline.  The upper incision was used for placement of  an 11-mm bladed disposable trocar which was done without injury while  elevating the anterior abdominal wall manually.  Direct  inspection did  reveal adequate placement.  Pneumoperitoneum was allowed to accumulate  using carbon dioxide gas.  A 5 mm trocar was placed through the lower  incision.  A blunt probe was used to carefully explore the pelvis and  the abdomen.  There were no apparent abnormalities in the upper abdomen  including a normal-appearing liver and appendix.  The only abnormality  noted in the pelvis was the enlarged uterus.  There were segments of  fallopian tubes missing consistent with previous tubal ligation.  Using  a spring-loaded grasping forceps, the right adnexa was elevated.  The  Enseal device was then used to progressively develop the  infundibulopelvic ligament, the round ligament, the upper broad ligament  to a level down to the uterine artery.  The left side was treated  identically.  Attention was then turned vaginally.  Posterior weighted  vaginal retractor was placed.  Hulka tenaculum was replaced with a  Jacobs tenaculum.  Deaver retractors were used for exposure anterior and  laterally.  Colpotomy incision was made while tenting the mucosa  posterior to the cervix.  The cervix was then circumscribed with a  scalpel to release the mucosa.  [facility] posterior  weighted retractor  was placed.  The LigaSure system was then used to seal and divide the  uterosacral pedicles and bladder pillars.  Bladder was carefully  advanced off the cervix.  Cardinal ligament pedicles were taken, sealed  and divided with LigaSure.  Anterior peritoneum was entered.  Vessel  pedicles were then taken sealed and divided with LigaSure and pedicle  just above the vessels on either side was then secured, sealed and  divided with LigaSure.  This allowed delivery of the uterus through the  vaginal introitus with tubes and ovaries attached.  Angles of vagina  were then anchored to uterosacrals with mattress suture of 0 Monocryl.  Cuff was closed vertically with figure-of-eights of 0 Monocryl.  Foley   catheter was placed.  Reinspection was then carried out abdominally  through the laparoscope using the Ne[place]hat irrigation system.  Hemostasis  in the pelvis was complete.  All pack, needle and instrument counts were  correct.  Gas was allowed escape from the abdomen.  The irrigating  solution was aspirated.  The instruments removed.  The skin incisions  were closed with interrupted subcuticular sutures of 3-0 Dexon.  Steri-  Strips were also applied to the lower incision.  The incisions were  injected with 0.25% plain Marcaine for postoperative analgesia.  The  patient was awakened, taken to recovery in good condition.      Maisie Fus, M.D.  Electronically Signed     WRN/MEDQ  D:  02/26/2008  T:  02/26/2008  Job:  [id number]

## 2010-09-05 NOTE — H&P (Signed)
NAME:  Caitlyn Fuentes, Caitlyn Fuentes NO.:  [id number]   MEDICAL RECORD NO.:  [id number]          PATIENT TYPE:  EMS   LOCATION:  MAJO                         FACILITY:  [facility]   PHYSICIAN:  Leana Gamer, MDDATE OF BIRTH:  Dec 24, 1955   DATE OF ADMISSION:  09/29/2006  DATE OF DISCHARGE:                              HISTORY & PHYSICAL   CHIEF COMPLAINT:  Near syncope.   HISTORY OF PRESENT ILLNESS:  This is a 55 year old lady who won at the  bowling alley and after bowling tonight had a near syncopal episode.  According to the patient, she was bowling and was very hot with her  clothes soaking wet from sweating, went to the bathroom and felt faint.  She states that she walked back to the outside counter area and spoke to  her husband and according to her husband she had an episode where she  almost passed out, but was speaking throughout the entire episode.  The  patient does not recall some parts of that, but does recall having a  cold compress being placed on her forehead and that she was able to  regain her orientation.  Her husband states that it was rather hot in  the bowling alley tonight and that there was another patron who had an  episode of fainting.  Congruent to that, the patient states that over  about a year and a half she has had episodes of fast heart rates  intermittently.  She was brought to the emergency room and during her  workup in the emergency room was found to have paroxysmal atrial  fibrillation with controlled heart rate.  Please note that the patient  also has recently been started on Cymbalta for her fibromyalgia.   PAST MEDICAL HISTORY:  Significant for:  1. Fibromyalgia.  2. Goiter.   FAMILY HISTORY:  Significant for:  1. Hypertension.  2. Diabetes.   PAST SURGICAL HISTORY:  None.   SOCIAL HISTORY:  The patient smokes about 6 to 8 cigarettes a day for  about 12 years.  She uses alcohol only occasionally and socially and she  denies any  drug use.   CURRENT MEDICATIONS:  Include Cymbalta 60 mg daily.   ALLERGIES:  NO KNOWN DRUG ALLERGIES.   PRIMARY CARE PHYSICIAN:  Jill Alexanders, M.D.   REVIEW OF SYSTEMS:  Fourteen systems were reviewed, all systems are  negative except as noted in the HPI.  Please note that the patient has  chronic myalgia due to her fibromyalgia.   LABORATORY DATA:  In the emergency room the patient had the following  studies done.  A hemogram shows a white blood cell count of 8.4,  hemoglobin was 13.7, hematocrit of 40.9, platelet count 400.  Sodium was  142, potassium 3.7, chloride of 108, bicarb 25, BUN 9, creatinine 1.08.  She has a troponin of less than 0.05.  A D-dimer mildly elevated at  0.59.   A chest CT angiogram was negative for a pulmonary embolus.   PHYSICAL EXAMINATION:  VITAL SIGNS:  The patient has a blood pressure of  122/73, heart rate of 84, respiratory rate is  16, O2 sat of 98% on room  air.  HEENT:  The patient is normocephalic and atraumatic.  Pupils equal,  round, reactive to light and accommodation.  Extraocular movements are  intact.  Tympanic membranes are translucent bilaterally with good  landmarks.  Oropharynx is moist, no exudate, erythema, or lesions are  noted.  NECK:  Trachea is midline.  The patient has a mild fullness of the  thyroid gland.  There are no distinct masses.  No jugular venous  distention.  No carotid bruits.  RESPIRATORY EXAMINATION:  The patient has a normal respiratory effort.  Equal excursion bilaterally.  No wheezes, rales, or rhonchi noted.  CARDIOVASCULAR:  She has a normal S1, S2.  No murmurs, rubs, or gallop  noted.  PMI is nondisplaced.  No heaves or thrills on palpation.  ABDOMEN:  Obese.  She has got normoactive bowel sounds.  Abdomen is  soft, nontender, nondistended.  No masses, no hepatosplenomegaly noted.  LYMPH NODE SURVEY:  The patient has no cervical, axillary, or inguinal  lymphadenopathy noted.  However, please note that the  patient does have  some subcutaneous nodules diffusely over the body.  NEUROLOGICAL:  The patient has no focal neurological deficits.  DTRs are  2+ bilaterally upper and lower extremities.  Sensation is intact to  light touch.  Pin prick and proprioception.  Strength is 5/5  bilaterally.  Upper and lower extremities.  PSYCHIATRIC:  She is alert and oriented x3.  She has normal cognition  and insight.  Good remote and recent recall.   ASSESSMENT AND PLAN:  This is a patient who came with a near syncopal  episode.  I feel that the patient's near syncopal episode would be  largely in part due to heat exhaustion associated with the ambient  temperatures as well as core temperature secondary to patient having  been bowling.  However, I do think it is fortuitous for the patient that  she is being evaluated, because it appears she has paroxysmal atrial  fibrillation which is untreated and less contributory to this episode.  In light of her near syncopal episode and her paroxysmal atrial  fibrillation, the patient is being brought on as an observation basis  and risk factors will be assessed for this patient in terms of her  relationship to risk of stroke.  The patient will have a carotid duplex  done, a 2D echo performed, and she will be started on Coumadin.  At this  point her heart rate is controlled.  I am unsure that there is any  necessity to start her on a rate controlled medication.  The patient  does report that she has had several readings of blood pressures greater  than 140/90 and is concerned that she may have hypertension.  Her  syncopal blood pressure here was 122/73 and a repeat blood pressure was  138/81.  We will continue to monitor her blood pressure and institute  therapy as necessary based upon findings.  In light of her near syncopal  episode, I will also stop her Cymbalta as the patient is also reporting that she is having night time terrors, which may be associated with  the  Cymbalta and Cymbalta is known to have syncopes and adverse effects  associated with it.  Last medical problem is tobacco use disorder.  The  patient has been counseled against further tobacco use and is a pre-  contemplated state at this time.      Leana Gamer, MD  Electronically Signed     MAM/MEDQ  D:  09/29/2006  T:  09/29/2006  Job:  [id number]

## 2010-09-05 NOTE — Cardiovascular Report (Signed)
NAMEMarland Kitchen  BAKER, YAUCH NO.:  [id number]   MEDICAL RECORD NO.:  [id number]          PATIENT TYPE:  INP   LOCATION:  [id number]                         FACILITY:  [facility]   PHYSICIAN:  Octavia Heir, MD  DATE OF BIRTH:  09-10-1955   DATE OF PROCEDURE:  10/02/2006  DATE OF DISCHARGE:                            CARDIAC CATHETERIZATION   PROCEDURE:  1. Left heart catheterization.  2. Coronary artery.  3. Left ventriculogram.   CARDIOLOGIST:  Octavia Heir, MD   COMPLICATIONS:  None.   INDICATIONS:  Ms. Hauschild is a 55 year old female, patient of  [facility] and Dr. Ellouise Newer with a history of diabetes and hypertension  who was admitted on September 29, 2006 with an episode of presyncope where she  became diaphoretic and lightheaded.  She was subsequently found to have  paroxysmal atrial fibrillation in the ER.  She is now brought for  cardiac catheterization to rule out CAD prior to initiation of long-term  anticoagulation.  Of note, she is noted to have an abnormal EKG with LVH  with probable strain though ischemia cannot be excluded.   DESCRIPTION OF OPERATION:  After giving informed consent the patient was  brought to the cardiac cath lab and right groin was shaved, prepped and  draped in the usual sterile fashion.  After anesthesia and monitoring  was established, using modified Seldinger technique a #6 French anterior  sheath into the right femoral artery.  A 6-French diagnostic catheter  was used to perform diagnostic angiography.   FINDINGS:  1. Left main is a large vessel with no significant disease.  2. The LAD is a large vessel coursing back to one diagonal branch.      The LAD has no significant disease.  3. First diagonal is a medium-to-large size vessel which has no      significant disease.  4. The left coronary artery has a large ramus intermedius which      bifurcates in the diagonal with no significant disease.  5. The left circumflex is a large  vessel coursing to the AV groove      and gives one obtuse marginal branch.  6. The AV circumflex has no significant disease.  7. The first OM is a medium-sized vessel which bifurcates in the mid      segment with no significant disease.  8. The right coronary artery is a large dominant and gives rise to the      PDA and posterolateral branch.  There is no significant disease in      the RCA, PDA or posterolateral branch.  9. Left ventriculogram reveals mildly depressed EF of 40% with global      hypokinesis.   HEMODYNAMIC RESULTS:  Arterial pressure 100/56, LV pressure was 102/1  and LVP of 4.   CONCLUSIONS:  1. No significant CAD.  2. Mild LV systolic function.      Octavia Heir, MD  Electronically Signed     RHM/MEDQ  D:  10/02/2006  T:  10/03/2006  Job:  [id number]   cc:   [facility]  Shelva Majestic, M.D.

## 2010-09-08 NOTE — Discharge Summary (Signed)
NAME:  Caitlyn Fuentes, Caitlyn Fuentes NO.:  [id number]   MEDICAL RECORD NO.:  [id number]          PATIENT TYPE:  OIB   LOCATION:  [id number]                          FACILITY:  [facility]   PHYSICIAN:  Maisie Fus, M.D.   DATE OF BIRTH:  1956/02/13   DATE OF ADMISSION:  02/26/2008  DATE OF DISCHARGE:  02/27/2008                               DISCHARGE SUMMARY   DISCHARGE DIAGNOSES:  1. Uterine leiomyomata.  2. Uterine adenomyosis.  3. Benign hemorrhagic corpus luteal cysts of ovaries.  4. Clinical symptoms of dysfunctional uterine bleeding and chronic      pelvic pain.   OPERATIVE PROCEDURE:  Laparoscopic assisted vaginal hysterectomy with  bilateral salpingo-oophorectomy.   INTRAOPERATIVE AND POSTOPERATIVE COMPLICATIONS:  None.   DISPOSITION:  The patient was in satisfactory and improved condition at  the time of her discharge.  Of note, was a rash on her face possibly  consistent with lupus for which she had not been previously treated.  Arrangements have been made for her diagnostic evaluation of this  condition following discharge.  From a GYN standpoint, she is to have  aggressive increasing physical activity, but no vaginal entry.  She is  to call for fever, heavy bleeding, or severe pain.  She is to return to  the office in 2 weeks for postoperative followup.  She has narcotic pain  medications as part of her routine meds which is to continue at home as  needed as well as resuming all of her regular medications taken  preoperatively, and was restarted on her Coumadin.  She was given  Percocet 5/325 to be taken as needed for postoperative pain and Zofran 4  mg oral dissolving tablets to be taken as needed for nausea.   Details of present illness, past history, family history, review systems  and physical exam recorded in the admission note.   PHYSICAL FINDINGS:  On admission was remarkable for the skin rash noted  above, also for the pelvic findings with enlargement of the  uterus and  ultrasound findings were consistent with adenomyosis and leiomyomata.  She was treated perioperatively with an IV antibiotic and with PAS hose,  above-described laparoscopic assisted surgery was completed without any  intraoperative complications.  Postoperatively, her condition was  satisfactory and she was ambulating without assistance, tolerating a  regular diet, having adequate bowel and bladder function on the first  postoperative day.  She had been afebrile throughout at the time of her  admission.  She was discharged home with disposition as noted above.      Maisie Fus, M.D.  Electronically Signed     WRN/MEDQ  D:  04/09/2008  T:  04/10/2008  Job:  [id number]

## 2011-01-12 LAB — CBC
Hemoglobin: 10.7 — ABNORMAL LOW
RBC: 3.38 — ABNORMAL LOW
WBC: 5.8

## 2011-01-12 LAB — I-STAT 8, (EC8 V) (CONVERTED LAB)
Acid-Base Excess: 1
HCT: 34 — ABNORMAL LOW
Operator id: 151321
Potassium: 3.7
Sodium: 138
TCO2: 28
pH, Ven: 7.399 — ABNORMAL HIGH

## 2011-01-12 LAB — DIFFERENTIAL
Basophils Relative: 0
Lymphocytes Relative: 26
Lymphs Abs: 1.5
Monocytes Absolute: 0.6
Monocytes Relative: 10
Neutro Abs: 3.6
Neutrophils Relative %: 62

## 2011-01-12 LAB — APTT: aPTT: 47 — ABNORMAL HIGH

## 2011-01-12 LAB — POCT CARDIAC MARKERS
Operator id: 151321
Troponin i, poc: <0.05

## 2011-01-12 LAB — LIPID PANEL
HDL: 24 — ABNORMAL LOW
VLDL: 42 — ABNORMAL HIGH

## 2011-01-12 LAB — PROTIME-INR
INR: 3 — ABNORMAL HIGH
INR: 3.4 — ABNORMAL HIGH
Prothrombin Time: 32.9 — ABNORMAL HIGH

## 2011-01-12 LAB — HOMOCYSTEINE: Homocysteine: 19.8 — ABNORMAL HIGH

## 2011-01-12 LAB — ETHANOL: Alcohol, Ethyl (B): <5

## 2011-01-12 LAB — POCT I-STAT CREATININE: Creatinine, Ser: 1.2

## 2011-01-19 LAB — URINALYSIS, ROUTINE W REFLEX MICROSCOPIC
Bilirubin Urine: NEGATIVE
Glucose, UA: NEGATIVE
Hgb urine dipstick: NEGATIVE
Ketones, ur: NEGATIVE
Nitrite: NEGATIVE
Specific Gravity, Urine: 1.012
pH: 5

## 2011-01-19 LAB — CBC
HCT: 34.1 — ABNORMAL LOW
Hemoglobin: 11.6 — ABNORMAL LOW
MCHC: 34.1
MCV: 90.4
RBC: 3.77 — ABNORMAL LOW
RDW: 13.9

## 2011-01-19 LAB — POCT CARDIAC MARKERS
CKMB, poc: 1
Myoglobin, poc: 115
Myoglobin, poc: 99.9
Operator id: 133351

## 2011-01-19 LAB — DIFFERENTIAL
Basophils Absolute: 0
Basophils Relative: 1
Eosinophils Absolute: 0.1
Eosinophils Relative: 2
Monocytes Absolute: 0.3
Monocytes Relative: 7

## 2011-01-19 LAB — POCT I-STAT, CHEM 8
BUN: 29 — ABNORMAL HIGH
Creatinine, Ser: 1.5 — ABNORMAL HIGH
Glucose, Bld: 118 — ABNORMAL HIGH
Hemoglobin: 12.2
TCO2: 28

## 2011-01-23 LAB — PROTIME-INR
Prothrombin Time: 14.2
Prothrombin Time: 32.6 — ABNORMAL HIGH

## 2011-01-23 LAB — COMPREHENSIVE METABOLIC PANEL
BUN: 12
CO2: 31
Calcium: 9.1
Creatinine, Ser: 0.99
GFR calc non Af Amer: 59 — ABNORMAL LOW
Glucose, Bld: 103 — ABNORMAL HIGH
Total Bilirubin: 0.8

## 2011-01-23 LAB — CBC
HCT: 25.9 — ABNORMAL LOW
Hemoglobin: 9 — ABNORMAL LOW
Hemoglobin: 9.5 — ABNORMAL LOW
MCHC: 33.4
MCHC: 33.7
MCHC: 34
MCHC: 34.8
MCV: 92.4
MCV: 93.3
Platelets: 375
Platelets: 499 — ABNORMAL HIGH
RBC: 2.8 — ABNORMAL LOW
RBC: 3.08 — ABNORMAL LOW
RDW: 13.5
RDW: 14.5

## 2011-01-23 LAB — BASIC METABOLIC PANEL
CO2: 28
Calcium: 8.7
Glucose, Bld: 107 — ABNORMAL HIGH
Sodium: 139

## 2011-01-23 LAB — TYPE AND SCREEN
ABO/RH(D): AB POS
Antibody Screen: NEGATIVE

## 2011-01-30 LAB — DIFFERENTIAL
Eosinophils Relative: 6 — ABNORMAL HIGH
Lymphocytes Relative: 57 — ABNORMAL HIGH
Lymphs Abs: 2.2
Monocytes Relative: 11
Neutro Abs: 1 — ABNORMAL LOW

## 2011-01-30 LAB — POCT CARDIAC MARKERS
CKMB, poc: <1 — ABNORMAL LOW
CKMB, poc: <1 — ABNORMAL LOW
Myoglobin, poc: 47.8
Myoglobin, poc: 52
Operator id: 284251
Operator id: 284251
Troponin i, poc: <0.05

## 2011-01-30 LAB — CBC
HCT: 31.7 — ABNORMAL LOW
Hemoglobin: 11 — ABNORMAL LOW
WBC: 3.8 — ABNORMAL LOW

## 2011-01-30 LAB — I-STAT 8, (EC8 V) (CONVERTED LAB)
BUN: 16
Bicarbonate: 25.7 — ABNORMAL HIGH
Glucose, Bld: 102 — ABNORMAL HIGH
Hemoglobin: 11.9 — ABNORMAL LOW
Operator id: 284251
Sodium: 138
TCO2: 27
pCO2, Ven: 40.3 — ABNORMAL LOW

## 2011-01-30 LAB — POCT I-STAT CREATININE: Creatinine, Ser: 1

## 2011-01-30 LAB — PROTIME-INR: Prothrombin Time: 24.7 — ABNORMAL HIGH

## 2011-02-07 LAB — POCT CARDIAC MARKERS
Operator id: 288831
Troponin i, poc: <0.05

## 2011-02-07 LAB — POCT I-STAT CREATININE
Creatinine, Ser: 1
Operator id: 288831

## 2011-02-07 LAB — I-STAT 8, (EC8 V) (CONVERTED LAB)
BUN: 21
Chloride: 108
Glucose, Bld: 85
Potassium: 5.3 — ABNORMAL HIGH
pCO2, Ven: 35.3 — ABNORMAL LOW
pH, Ven: 7.417 — ABNORMAL HIGH

## 2011-02-08 LAB — POCT CARDIAC MARKERS
CKMB, poc: 1.1
CKMB, poc: 1.2
Myoglobin, poc: 119
Myoglobin, poc: 97.6
Operator id: 189501
Operator id: 192351
Troponin i, poc: <0.05
Troponin i, poc: <0.05

## 2011-02-08 LAB — CBC
HCT: 35.2 — ABNORMAL LOW
HCT: 35.2 — ABNORMAL LOW
HCT: 37.3
HCT: 40.9
Hemoglobin: 11.8 — ABNORMAL LOW
Hemoglobin: 12.6
Hemoglobin: 12.7
Hemoglobin: 13.7
MCHC: 33.6
MCHC: 33.6
MCHC: 34.1
MCHC: 34.1
MCV: 92.6
MCV: 93.6
MCV: 94
MCV: 94.3
MCV: 94.8
Platelets: 359
Platelets: 374
Platelets: 400
RBC: 3.76 — ABNORMAL LOW
RBC: 4.31
RDW: 12.6
RDW: 12.8
RDW: 12.9
RDW: 12.9
RDW: 13.2
WBC: 8.4

## 2011-02-08 LAB — BASIC METABOLIC PANEL
BUN: 5 — ABNORMAL LOW
CO2: 25
CO2: 26
Calcium: 8.5
Calcium: 9.3
Chloride: 105
Chloride: 106
Chloride: 106
Creatinine, Ser: 0.76
GFR calc Af Amer: >60
Glucose, Bld: 92
Glucose, Bld: 93
Glucose, Bld: 94
Potassium: 3.4 — ABNORMAL LOW
Sodium: 138
Sodium: 139

## 2011-02-08 LAB — TSH: TSH: 1.713

## 2011-02-08 LAB — POCT I-STAT CREATININE
Creatinine, Ser: 1
Operator id: 192351

## 2011-02-08 LAB — LIPID PANEL
Cholesterol: 185
HDL: 41
LDL Cholesterol: 134 — ABNORMAL HIGH
Total CHOL/HDL Ratio: 4.5
Triglycerides: 52
VLDL: 10

## 2011-02-08 LAB — DIFFERENTIAL
Basophils Absolute: 0
Basophils Relative: 0
Eosinophils Absolute: 0
Eosinophils Relative: 0
Lymphocytes Relative: 14
Lymphs Abs: 1.2
Monocytes Absolute: 0.5
Monocytes Relative: 6
Neutro Abs: 6.7
Neutrophils Relative %: 79 — ABNORMAL HIGH

## 2011-02-08 LAB — CARDIAC PANEL(CRET KIN+CKTOT+MB+TROPI)
Relative Index: 1
Troponin I: 0.03

## 2011-02-08 LAB — I-STAT 8, (EC8 V) (CONVERTED LAB)
Acid-base deficit: 1
BUN: 9
Bicarbonate: 25.4 — ABNORMAL HIGH
Chloride: 108
Glucose, Bld: 106 — ABNORMAL HIGH
HCT: 43
Hemoglobin: 14.6
Operator id: 192351
Potassium: 3.7
Sodium: 142
TCO2: 27
pCO2, Ven: 45.8
pH, Ven: 7.352 — ABNORMAL HIGH

## 2011-02-08 LAB — CK TOTAL AND CKMB (NOT AT ARMC)
CK, MB: 1.9
Relative Index: 1.2
Total CK: 159

## 2011-02-08 LAB — TROPONIN I: Troponin I: 0.03

## 2011-02-08 LAB — PROTIME-INR
INR: 1.1
INR: 1.1
Prothrombin Time: 13.9
Prothrombin Time: 14.1

## 2011-02-08 LAB — APTT: aPTT: 30

## 2011-02-08 LAB — D-DIMER, QUANTITATIVE: D-Dimer, Quant: 0.59 — ABNORMAL HIGH

## 2011-03-14 ENCOUNTER — Other Ambulatory Visit: Payer: Self-pay | Admitting: Gastroenterology

## 2011-05-10 DIAGNOSIS — I272 Pulmonary hypertension, unspecified: Secondary | ICD-10-CM

## 2011-05-10 HISTORY — DX: Pulmonary hypertension, unspecified: I27.20

## 2011-05-29 ENCOUNTER — Other Ambulatory Visit: Payer: Self-pay | Admitting: Internal Medicine

## 2011-05-31 ENCOUNTER — Ambulatory Visit
Admission: RE | Admit: 2011-05-31 | Discharge: 2011-05-31 | Disposition: A | Discharge status: Home / Self Care | Payer: Managed Care, Other (non HMO) | Source: Ambulatory Visit | Attending: Internal Medicine | Admitting: Internal Medicine

## 2011-06-15 ENCOUNTER — Other Ambulatory Visit: Payer: Self-pay | Admitting: Internal Medicine

## 2011-06-15 DIAGNOSIS — D1771 Benign lipomatous neoplasm of kidney: Secondary | ICD-10-CM

## 2011-06-21 ENCOUNTER — Other Ambulatory Visit: Payer: Managed Care, Other (non HMO)

## 2011-06-27 ENCOUNTER — Ambulatory Visit
Admission: RE | Admit: 2011-06-27 | Discharge: 2011-06-27 | Disposition: A | Discharge status: Home / Self Care | Payer: Managed Care, Other (non HMO) | Source: Ambulatory Visit | Attending: Internal Medicine | Admitting: Internal Medicine

## 2011-06-27 DIAGNOSIS — D1771 Benign lipomatous neoplasm of kidney: Secondary | ICD-10-CM

## 2011-08-16 ENCOUNTER — Emergency Department (HOSPITAL_COMMUNITY)
Admission: EM | Admit: 2011-08-16 | Discharge: 2011-08-16 | Disposition: A | Discharge status: Home Health | Payer: Managed Care, Other (non HMO) | Attending: Emergency Medicine | Admitting: Emergency Medicine

## 2011-08-16 ENCOUNTER — Emergency Department (HOSPITAL_COMMUNITY): Payer: Managed Care, Other (non HMO)

## 2011-08-16 ENCOUNTER — Encounter (HOSPITAL_COMMUNITY): Payer: Self-pay | Admitting: Emergency Medicine

## 2011-08-16 DIAGNOSIS — I480 Paroxysmal atrial fibrillation: Secondary | ICD-10-CM | POA: Diagnosis not present

## 2011-08-16 DIAGNOSIS — R110 Nausea: Secondary | ICD-10-CM | POA: Insufficient documentation

## 2011-08-16 DIAGNOSIS — M329 Systemic lupus erythematosus, unspecified: Secondary | ICD-10-CM | POA: Insufficient documentation

## 2011-08-16 DIAGNOSIS — I10 Essential (primary) hypertension: Secondary | ICD-10-CM | POA: Insufficient documentation

## 2011-08-16 DIAGNOSIS — Z79899 Other long term (current) drug therapy: Secondary | ICD-10-CM | POA: Insufficient documentation

## 2011-08-16 DIAGNOSIS — G4733 Obstructive sleep apnea (adult) (pediatric): Secondary | ICD-10-CM | POA: Diagnosis present

## 2011-08-16 DIAGNOSIS — E079 Disorder of thyroid, unspecified: Secondary | ICD-10-CM | POA: Insufficient documentation

## 2011-08-16 DIAGNOSIS — Z7901 Long term (current) use of anticoagulants: Secondary | ICD-10-CM | POA: Insufficient documentation

## 2011-08-16 DIAGNOSIS — R0789 Other chest pain: Secondary | ICD-10-CM | POA: Insufficient documentation

## 2011-08-16 DIAGNOSIS — M7989 Other specified soft tissue disorders: Secondary | ICD-10-CM | POA: Insufficient documentation

## 2011-08-16 DIAGNOSIS — R600 Localized edema: Secondary | ICD-10-CM | POA: Diagnosis present

## 2011-08-16 DIAGNOSIS — R209 Unspecified disturbances of skin sensation: Secondary | ICD-10-CM | POA: Insufficient documentation

## 2011-08-16 DIAGNOSIS — R42 Dizziness and giddiness: Secondary | ICD-10-CM | POA: Insufficient documentation

## 2011-08-16 DIAGNOSIS — R0602 Shortness of breath: Secondary | ICD-10-CM | POA: Insufficient documentation

## 2011-08-16 DIAGNOSIS — I4891 Unspecified atrial fibrillation: Secondary | ICD-10-CM | POA: Insufficient documentation

## 2011-08-16 DIAGNOSIS — R609 Edema, unspecified: Secondary | ICD-10-CM | POA: Insufficient documentation

## 2011-08-16 HISTORY — DX: Reserved for concepts with insufficient information to code with codable children: IMO0002

## 2011-08-16 HISTORY — DX: Sleep apnea, unspecified: G47.30

## 2011-08-16 HISTORY — DX: Disorder of kidney and ureter, unspecified: N28.9

## 2011-08-16 HISTORY — DX: Essential (primary) hypertension: I10

## 2011-08-16 HISTORY — DX: Systemic lupus erythematosus, unspecified: M32.9

## 2011-08-16 HISTORY — DX: Unspecified atrial fibrillation: I48.91

## 2011-08-16 HISTORY — DX: Disorder of thyroid, unspecified: E07.9

## 2011-08-16 LAB — BASIC METABOLIC PANEL
BUN: 19 mg/dL (ref 6–23)
CO2: 30 mEq/L (ref 19–32)
Calcium: 9.8 mg/dL (ref 8.4–10.5)
Chloride: 102 mEq/L (ref 96–112)
Creatinine, Ser: 1.29 mg/dL — ABNORMAL HIGH (ref 0.50–1.10)
GFR calc Af Amer: 53 mL/min — ABNORMAL LOW (ref >90)
GFR calc non Af Amer: 46 mL/min — ABNORMAL LOW (ref >90)
Glucose, Bld: 98 mg/dL (ref 70–99)
Potassium: 3.7 mEq/L (ref 3.5–5.1)
Sodium: 143 mEq/L (ref 135–145)

## 2011-08-16 LAB — CBC
HCT: 33.9 % — ABNORMAL LOW (ref 36.0–46.0)
Hemoglobin: 11.6 g/dL — ABNORMAL LOW (ref 12.0–15.0)
MCH: 30.6 pg (ref 26.0–34.0)
MCHC: 34.2 g/dL (ref 30.0–36.0)
MCV: 89.4 fL (ref 78.0–100.0)
Platelets: 305 10*3/uL (ref 150–400)
RBC: 3.79 MIL/uL — ABNORMAL LOW (ref 3.87–5.11)
RDW: 13.4 % (ref 11.5–15.5)
WBC: 4.1 10*3/uL (ref 4.0–10.5)

## 2011-08-16 LAB — PROTIME-INR: INR: 3.23 — ABNORMAL HIGH (ref 0.00–1.49)

## 2011-08-16 LAB — CARDIAC PANEL(CRET KIN+CKTOT+MB+TROPI)
CK, MB: 1.5 ng/mL (ref 0.3–4.0)
Total CK: 118 U/L (ref 7–177)
Troponin I: <0.3 ng/mL (ref <0.30)

## 2011-08-16 LAB — POCT I-STAT TROPONIN I: Troponin i, poc: 0 ng/mL (ref 0.00–0.08)

## 2011-08-16 LAB — PRO B NATRIURETIC PEPTIDE: Pro B Natriuretic peptide (BNP): 289.4 pg/mL — ABNORMAL HIGH (ref 0–125)

## 2011-08-16 MED ORDER — MORPHINE SULFATE 4 MG/ML IJ SOLN
4.0000 mg | Freq: Once | INTRAMUSCULAR | Status: DC
  Filled 2011-08-16: qty 1

## 2011-08-16 MED ORDER — NITROGLYCERIN 0.4 MG SL SUBL
SUBLINGUAL_TABLET | SUBLINGUAL | Status: AC
  Administered 2011-08-16: 16:00:00
  Filled 2011-08-16: qty 25

## 2011-08-16 MED ORDER — FUROSEMIDE 10 MG/ML IJ SOLN
40.0000 mg | Freq: Once | INTRAMUSCULAR | Status: AC
  Administered 2011-08-16: 40 mg via INTRAVENOUS
  Filled 2011-08-16: qty 4

## 2011-08-16 MED ORDER — ASPIRIN 325 MG PO TABS
325.0000 mg | ORAL_TABLET | ORAL | Status: AC
  Administered 2011-08-16: 325 mg via ORAL
  Filled 2011-08-16: qty 1

## 2011-08-16 NOTE — ED Provider Notes (Signed)
History     CSN: [id number]  Arrival date & time 08/16/11  1259   First MD Initiated Contact with Patient 08/16/11 1445      Chief Complaint  Patient presents with  . Chest Pain    (Consider location/radiation/quality/duration/timing/severity/associated sxs/prior treatment) HPI  Past Medical History  Diagnosis Date  . A-fib   . Cardiomyopathy   . Sleep apnea   . Cancer   . Thyroid disease   . Renal disorder   . Lupus   . Hypertension     History reviewed. No pertinent past surgical history.  No family history on file.  History  Substance Use Topics  . Smoking status: Never Smoker   . Smokeless tobacco: Not on file  . Alcohol Use: Yes    OB History    Grav Para Term Preterm Abortions TAB SAB Ect Mult Living                  Review of Systems  Allergies  Diovan and Lisinopril  Home Medications   Current Outpatient Rx  Name Route Sig Dispense Refill  . ATORVASTATIN CALCIUM 20 MG PO TABS Oral Take 20 mg by mouth daily.    Marland Kitchen CARVEDILOL PHOSPHATE ER 80 MG PO CP24 Oral Take 80 mg by mouth daily.    . CELECOXIB 200 MG PO CAPS Oral Take 200 mg by mouth 2 (two) times daily.    Marland Kitchen CICLOPIROX 8 % EX SOLN Topical Apply 1 application topically at bedtime. Apply over nail and surrounding skin. Apply daily over previous coat. After seven (7) days, may remove with alcohol and continue cycle.  To toenails    . DILTIAZEM HCL 120 MG PO TABS Oral Take 120 mg by mouth at bedtime.    Marland Kitchen ESTRADIOL 0.075 MG/24HR TD PTTW Transdermal Place 1 patch onto the skin 2 (two) times a week. Sunday and Thursday    . FERROUS SULFATE 325 (65 FE) MG PO TABS Oral Take 325 mg by mouth daily with breakfast.    . ADULT MULTIVITAMIN W/MINERALS CH Oral Take 1 tablet by mouth daily.    Marland Kitchen PARICALCITOL 1 MCG PO CAPS Oral Take 1 mcg by mouth daily.    Marland Kitchen POTASSIUM CHLORIDE 20 MEQ/15ML (10%) PO LIQD Oral Take 20 mEq by mouth daily.    . TORSEMIDE 20 MG PO TABS Oral Take 20 mg by mouth 2 (two) times  daily. 2 tablets in the morning 1 tablet at night    . WARFARIN SODIUM 5 MG PO TABS Oral Take 5-7.5 mg by mouth daily. 5mg -Monday and Friday 7.5mg - all other days    . ZOLPIDEM TARTRATE 10 MG PO TABS Oral Take 10 mg by mouth at bedtime.      BP 119/61  Pulse 77  Temp(Src) 98.6 F (37 C) (Oral)  Resp 16  Wt 240 lb (108.863 kg)  SpO2 99%  Physical Exam  ED Course  Procedures (including critical care time)  Labs Reviewed  CBC - Abnormal; Notable for the following:    RBC 3.79 (*)    Hemoglobin 11.6 (*)    HCT 33.9 (*)    All other components within normal limits  BASIC METABOLIC PANEL - Abnormal; Notable for the following:    Creatinine, Ser 1.29 (*)    GFR calc non Af Amer 46 (*)    GFR calc Af Amer 53 (*)    All other components within normal limits  PRO B NATRIURETIC PEPTIDE - Abnormal; Notable for the following:  Pro B Natriuretic peptide (BNP) 289.4 (*)    All other components within normal limits  PROTIME-INR - Abnormal; Notable for the following:    Prothrombin Time 33.5 (*)    INR 3.23 (*)    All other components within normal limits  POCT I-STAT TROPONIN I  CARDIAC PANEL(CRET KIN+CKTOT+MB+TROPI)   Dg Chest 2 View  08/16/2011  *RADIOLOGY REPORT*  Clinical Data: Chest pain and shortness of breath  CHEST - 2 VIEW  Comparison: 09/12/2009  Findings: The heart size and mediastinal contours are within normal limits.  Both lungs are clear.  The visualized skeletal structures are unremarkable.  IMPRESSION: Negative exam.  Original Report Authenticated By: Angelita Ingles, M.D.     Atypical Chest pain    MDM  Care of patient taken over from H. VanWingam, PA-C - please see her note for HPI, ROS, and PE - second set of cardiac markers in negative and in accordance with plan per [facility] we will discharge her home with close follow up with them.       Idalia Needle [place], [place] 08/16/11 2115

## 2011-08-16 NOTE — Discharge Instructions (Signed)
Chest Pain (Nonspecific) Chest pain has many causes. Your pain could be caused by something serious, such as a heart attack or a blood clot in the lungs. It could also be caused by something less serious, such as a chest bruise or a virus. Follow up with your doctor. More lab tests or other studies may be needed to find the cause of your pain. Most of the time, nonspecific chest pain will improve within 2 to 3 days of rest and mild pain medicine. HOME CARE  For chest bruises, you may put ice on the sore area for 15 to 20 minutes, 3 to 4 times a day. Do this only if it makes you or your child feel better.   Put ice in a plastic bag.   Place a towel between the skin and the bag.   Rest for the next 2 to 3 days.   Go back to work if the pain improves.   See your doctor if the pain lasts longer than 1 to 2 weeks.   Only take medicine as told by your doctor.   Quit smoking if you smoke.  GET HELP RIGHT AWAY IF:   There is more pain or pain that spreads to the arm, neck, jaw, back, or belly (abdomen).   You or your child has shortness of breath.   You or your child coughs more than usual or coughs up blood.   You or your child has very bad back or belly pain, feels sick to his or her stomach (nauseous), or throws up (vomits).   You or your child has very bad weakness.   You or your child passes out (faints).   You or your child has a temperature by mouth above 102 F (38.9 C), not controlled by medicine.  Any of these problems may be serious and may be an emergency. Do not wait to see if the problems will go away. Get medical help right away. Call your local emergency services 911 in [place] Do not drive yourself to the hospital. MAKE SURE YOU:   Understand these instructions.   Will watch this condition.   Will get help right away if you or your child is not doing well or gets worse.  Document Released: 09/26/2007 Document Revised: 03/29/2011 Document Reviewed:  09/26/2007 [facility] Patient Information 2012 [place].Chest Pain (Nonspecific) It is often hard to give a specific diagnosis for the cause of chest pain. There is always a chance that your pain could be related to something serious, such as a heart attack or a blood clot in the lungs. You need to follow up with your caregiver for further evaluation. CAUSES   Heartburn.   Pneumonia or bronchitis.   Anxiety or stress.   Inflammation around your heart (pericarditis) or lung (pleuritis or pleurisy).   A blood clot in the lung.   A collapsed lung (pneumothorax). It can develop suddenly on its own (spontaneous pneumothorax) or from injury (trauma) to the chest.   Shingles infection (herpes zoster virus).  The chest wall is composed of bones, muscles, and cartilage. Any of these can be the source of the pain.  The bones can be bruised by injury.   The muscles or cartilage can be strained by coughing or overwork.   The cartilage can be affected by inflammation and become sore (costochondritis).  DIAGNOSIS  Lab tests or other studies, such as X-rays, electrocardiography, stress testing, or cardiac imaging, may be needed to find the cause of your pain.  TREATMENT   Treatment depends on what may be causing your chest pain. Treatment may include:   Acid blockers for heartburn.   Anti-inflammatory medicine.   Pain medicine for inflammatory conditions.   Antibiotics if an infection is present.   You may be advised to change lifestyle habits. This includes stopping smoking and avoiding alcohol, caffeine, and chocolate.   You may be advised to keep your head raised (elevated) when sleeping. This reduces the chance of acid going backward from your stomach into your esophagus.   Most of the time, nonspecific chest pain will improve within 2 to 3 days with rest and mild pain medicine.  HOME CARE INSTRUCTIONS   If antibiotics were prescribed, take your antibiotics as directed. Finish  them even if you start to feel better.   For the next few days, avoid physical activities that bring on chest pain. Continue physical activities as directed.   Do not smoke.   Avoid drinking alcohol.   Only take over-the-counter or prescription medicine for pain, discomfort, or fever as directed by your caregiver.   Follow your caregiver's suggestions for further testing if your chest pain does not go away.   Keep any follow-up appointments you made. If you do not go to an appointment, you could develop lasting (chronic) problems with pain. If there is any problem keeping an appointment, you must call to reschedule.  SEEK MEDICAL CARE IF:   You think you are having problems from the medicine you are taking. Read your medicine instructions carefully.   Your chest pain does not go away, even after treatment.   You develop a rash with blisters on your chest.  SEEK IMMEDIATE MEDICAL CARE IF:   You have increased chest pain or pain that spreads to your arm, neck, jaw, back, or abdomen.   You develop shortness of breath, an increasing cough, or you are coughing up blood.   You have severe back or abdominal pain, feel nauseous, or vomit.   You develop severe weakness, fainting, or chills.   You have a fever.  THIS IS AN EMERGENCY. Do not wait to see if the pain will go away. Get medical help at once. Call your local emergency services (911 in [place] Do not drive yourself to the hospital. MAKE SURE YOU:   Understand these instructions.   Will watch your condition.   Will get help right away if you are not doing well or get worse.  Document Released: 01/17/2005 Document Revised: 03/29/2011 Document Reviewed: 11/13/2007 [facility] Patient Information 2012 [place].

## 2011-08-16 NOTE — ED Notes (Signed)
Placed pt on monitor continuous pulse ox and blood pressure cuff; family at bed side

## 2011-08-16 NOTE — H&P (Signed)
Caitlyn Fuentes is an 56 y.o. female.   Chief Complaint:  Chest pain HPI:   Patient is a 56 year old morbidly obese American female with history of nonischemic cardiomyopathy, hypertension, fibromyalgia. She underwent cardiac catheterization in April of 2008 she was found to have  no coronary artery disease, however her ejection fraction was 35%. Her most recent echocardiogram was 05/10/2011 which showed her ejection fraction improved to 45%. Her most recent nuclear stress test was January 2010 this showed a perfusion defect in the anterior myocardium region which was consistent with breast attenuation the rest of the myocardium demonstrated normal myocardial perfusion with no evidence of ischemia.  Her ejection fraction at that time was 56%.  Her history also includes paroxysmal atrial fibrillation for which she takes Coumadin. She also has a history of obstructive sleep apnea and wears a CPAP every day.  The patient presents today with "tightness" in the chest she states it is worse with exertion she also describes it as a "heaviness".  Has been coming and going for approximately one month however today has been very constant.  She has associated shortness of breath, nausea and right arm  numbness/tingling. She also complains of being tired and that she's gained 20 pounds in the last 3 months and has lower extremity edema.  She states she cannot sleep flat in the bed to do increased shortness of breath and must sleep elevated and she occasionally wakes up feeling like she smothering. She did sleep study approximately 2 weeks ago for which she still needs to follow-up.  She has problems with constipation however her last bowel movement was yesterday. 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  .  Past Medical History  Diagnosis Date  . A-fib   . Cardiomyopathy   . Sleep apnea   . Cancer   . Thyroid disease   . Renal disorder   . Lupus   . Hypertension    Medications Celebrex 200  mg twice daily however states it is as needed, Coreg 80 mg, torsemide 40 mg, Coumadin per INR, Cardizem CD 120 mg at bedtime, atorvastatin 20 mg, ferrous sulfate, Zemplar, CPAP  History reviewed. No pertinent past surgical history.  No family history on file. Social History:  reports that she has never smoked. She does not have any smokeless tobacco history on file. She reports that she drinks alcohol. She reports that she does not use illicit drugs.  Allergies:  Allergies  Allergen Reactions  . Diovan (Valsartan) Other (See Comments)    Messes up kidneys  . Lisinopril Swelling    Facial swelling     (Not in a hospital admission)  Results for orders placed during the hospital encounter of 08/16/11 (from the past 48 hour(s))  PRO B NATRIURETIC PEPTIDE     Status: Abnormal   Collection Time   08/16/11  1:20 PM      Component Value Range Comment   Pro B Natriuretic peptide (BNP) 289.4 (*) 0 - 125 (pg/mL)   CBC     Status: Abnormal   Collection Time   08/16/11  1:21 PM      Component Value Range Comment   WBC 4.1  4.0 - 10.5 (K/uL)    RBC 3.79 (*) 3.87 - 5.11 (MIL/uL)    Hemoglobin 11.6 (*) 12.0 - 15.0 (g/dL)    HCT 33.9 (*) 36.0 - 46.0 (%)    MCV 89.4  78.0 - 100.0 (fL)    MCH 30.6  26.0 - 34.0 (pg)  MCHC 34.2  30.0 - 36.0 (g/dL)    RDW 13.4  11.5 - 15.5 (%)    Platelets 305  150 - 400 (K/uL)   BASIC METABOLIC PANEL     Status: Abnormal   Collection Time   08/16/11  1:21 PM      Component Value Range Comment   Sodium 143  135 - 145 (mEq/L)    Potassium 3.7  3.5 - 5.1 (mEq/L)    Chloride 102  96 - 112 (mEq/L)    CO2 30  19 - 32 (mEq/L)    Glucose, Bld 98  70 - 99 (mg/dL)    BUN 19  6 - 23 (mg/dL)    Creatinine, Ser 1.29 (*) 0.50 - 1.10 (mg/dL)    Calcium 9.8  8.4 - 10.5 (mg/dL)    GFR calc non Af Amer 46 (*) >90 (mL/min)    GFR calc Af Amer 53 (*) >90 (mL/min)   PROTIME-INR     Status: Abnormal   Collection Time   08/16/11  2:51 PM      Component Value Range Comment     Prothrombin Time 33.5 (*) 11.6 - 15.2 (seconds)    INR 3.23 (*) 0.00 - 1.49    POCT I-STAT TROPONIN I     Status: Normal   Collection Time   08/16/11  2:57 PM      Component Value Range Comment   Troponin i, poc 0.00  0.00 - 0.08 (ng/mL)    Comment 3             Dg Chest 2 View  08/16/2011  *RADIOLOGY REPORT*  Clinical Data: Chest pain and shortness of breath  CHEST - 2 VIEW  Comparison: 09/12/2009  Findings: The heart size and mediastinal contours are within normal limits.  Both lungs are clear.  The visualized skeletal structures are unremarkable.  IMPRESSION: Negative exam.  Original Report Authenticated By: Angelita Ingles, M.D.    Review of Systems  Constitutional: Positive for diaphoresis. Negative for fever and weight loss.  HENT: Negative for congestion and sore throat.   Eyes: Positive for double vision.  Respiratory: Positive for cough and shortness of breath. Negative for wheezing.   Cardiovascular: Positive for chest pain, palpitations, orthopnea, leg swelling and PND.  Gastrointestinal: Positive for nausea, abdominal pain (For the last 3 months) and constipation (her last bowel movement was yesterday). Negative for vomiting.  Genitourinary: Negative for dysuria and hematuria.  Neurological: Negative for dizziness.       Paresthesia in the right forearm and hand    Blood pressure 126/38, pulse 59, temperature 97.6 F (36.4 C), resp. rate 20, weight 108.863 kg (240 lb), SpO2 95.00%. Physical Exam  Constitutional: She is oriented to person, place, and time. No distress.       Morbidly obese  HENT:  Head: Normocephalic and atraumatic.  Eyes: EOM are normal. Pupils are equal, round, and reactive to light. No scleral icterus.  Neck: Normal range of motion. Neck supple. No JVD present.  Cardiovascular: Normal rate and regular rhythm.   No murmur heard. Pulses:      Radial pulses are 2+ on the right side, and 2+ on the left side.       Dorsalis pedis pulses are 2+ on the  right side, and 2+ on the left side.       No carotid bruits  GI: Soft. Bowel sounds are normal. There is tenderness (epigastric region).  Musculoskeletal: She exhibits edema (1+ lower extremity  edema).  Lymphadenopathy:    She has no cervical adenopathy.  Neurological: She is alert and oriented to person, place, and time. She exhibits normal muscle tone.  Skin: Skin is warm and dry.       Erythema on left forearm is a birthmark.  Psychiatric: She has a normal mood and affect.     Assessment/Plan Patient Active Hospital Problem List: Chest pain, atypical (08/16/2011) Morbid obesity (08/16/2011) Bilateral lower extremity edema (08/16/2011) Obstructive sleep apnea on CPAP (08/16/2011) Paroxysmal atrial fibrillation (08/16/2011)  Plan:  Patient is a 56 year old African American obese female Presenting with intermittent chest pain for one month which became constant this morning.   History of nonischemic cardiomyopathy and hypertension, normal coronaries by cardiac cath in 2008 nonischemic nuclear stress test in 2010.  Ejection fraction shows improvement from 30-35% to 40-45% by a recent 2-D echocardiogram in January 2013. She has nonspecific T wave changes in inferior leads and lateral precordial leads but is in normal sinus rhythm at a rate of 60 beats per minute. Initial troponin are negative she is a mildly positive BNP at 289. INR is mildly supratherapeutic at 3.23.  Chest pain is not currently resolved and may have improved slightly after one nitroglycerin. However it is exacerbated with palpitation and chest wall.  Will give one dose IV Lasix 40mg .  Recheck Cardiac enzymes at 1700hrs. If negative, DC home with PRN Nitro.  Will order outpatient Lexiscan.  Increase home torsemide to 40mg  BID.  BMET on Monday.     Caitlyn Fuentes W 08/16/2011, 4:17 PM

## 2011-08-16 NOTE — ED Provider Notes (Signed)
History     CSN: [id number]  Arrival date & time 08/16/11  1259   None     Chief Complaint  Patient presents with  . Chest Pain    (Consider location/radiation/quality/duration/timing/severity/associated sxs/prior treatment) HPI Comments: Patient comes in today with a chief complaint of chest tightness, lightheadedness, nausea, and tingling of her right arm.  She reports that she has had similar symptoms intermittently over the past month.  Her symptoms typically resolve after 30-60 minutes.  She reports that today her symptoms began approximately 7 hours ago and have not resolved.  She also reports that she has had some shortness of breath with exertion, which is becoming progressively worse over the past month.  Her PMH is significant for afib, nonischemic cardiomyopathy, CHF, HTN, and renal disease.  She reports that she had an Echocardiogram done a few months ago that showed an EF of 41%.  Review of the chart shows an EF of 60-65% on 09/13/09.  Most recent Echocardiogram was not in the system.  Review of the chart shows that she had cardiac catheterization on 09/12/09, which showed nonischemic cardiomyopathy with LEF of 40%.  Her coronary arteries were normal at that time.   She is also complaining of swelling of her lower extremities around her ankles bilaterally.  She is currently on Torsemide 20mg  bid.  She is also currently on Warfarin for afib.   Her cardiologist is Shelva Majestic with [facility].    The history is provided by the patient and the spouse.    Past Medical History  Diagnosis Date  . A-fib   . Cardiomyopathy   . Sleep apnea   . Cancer   . Thyroid disease   . Renal disorder   . Lupus   . Hypertension     History reviewed. No pertinent past surgical history.  No family history on file.  History  Substance Use Topics  . Smoking status: Never Smoker   . Smokeless tobacco: Not on file  . Alcohol Use: Yes    OB History    Grav Para Term Preterm Abortions TAB  SAB Ect Mult Living                  Review of Systems  Constitutional: Negative for fever and chills.  HENT: Negative for neck pain and neck stiffness.   Respiratory: Positive for chest tightness and shortness of breath. Negative for cough and wheezing.        SOB with exertion  Cardiovascular: Positive for chest pain and leg swelling. Negative for palpitations.  Gastrointestinal: Positive for nausea. Negative for vomiting and abdominal pain.  Musculoskeletal: Negative for gait problem.  Skin: Negative for rash.  Neurological: Positive for light-headedness and numbness. Negative for dizziness, syncope and weakness.    Allergies  Diovan and Lisinopril  Home Medications   Current Outpatient Rx  Name Route Sig Dispense Refill  . ATORVASTATIN CALCIUM 20 MG PO TABS Oral Take 20 mg by mouth daily.    Marland Kitchen CARVEDILOL PHOSPHATE ER 80 MG PO CP24 Oral Take 80 mg by mouth daily.    . CELECOXIB 200 MG PO CAPS Oral Take 200 mg by mouth 2 (two) times daily.    Marland Kitchen CICLOPIROX 8 % EX SOLN Topical Apply 1 application topically at bedtime. Apply over nail and surrounding skin. Apply daily over previous coat. After seven (7) days, may remove with alcohol and continue cycle.  To toenails    . DILTIAZEM HCL 120 MG PO TABS Oral  Take 120 mg by mouth at bedtime.    Marland Kitchen ESTRADIOL 0.075 MG/24HR TD PTTW Transdermal Place 1 patch onto the skin 2 (two) times a week. Sunday and Thursday    . FERROUS SULFATE 325 (65 FE) MG PO TABS Oral Take 325 mg by mouth daily with breakfast.    . ADULT MULTIVITAMIN W/MINERALS CH Oral Take 1 tablet by mouth daily.    Marland Kitchen PARICALCITOL 1 MCG PO CAPS Oral Take 1 mcg by mouth daily.    Marland Kitchen POTASSIUM CHLORIDE 20 MEQ/15ML (10%) PO LIQD Oral Take 20 mEq by mouth daily.    . TORSEMIDE 20 MG PO TABS Oral Take 20 mg by mouth 2 (two) times daily. 2 tablets in the morning 1 tablet at night    . WARFARIN SODIUM 5 MG PO TABS Oral Take 5-7.5 mg by mouth daily. 5mg -Monday and Friday 7.5mg - all  other days    . ZOLPIDEM TARTRATE 10 MG PO TABS Oral Take 10 mg by mouth at bedtime.      BP 126/38  Pulse 59  Temp 97.6 F (36.4 C)  Resp 20  Wt 240 lb (108.863 kg)  SpO2 95%  Physical Exam  Nursing note and vitals reviewed. Constitutional: She appears well-developed and well-nourished. No distress.  HENT:  Head: Normocephalic and atraumatic.  Mouth/Throat: Oropharynx is clear and moist.  Neck: Normal range of motion. Neck supple.  Cardiovascular: Normal rate, regular rhythm, normal heart sounds and intact distal pulses.   Pulses:      Dorsalis pedis pulses are 2+ on the right side, and 2+ on the left side.       1+ pitting edema of the ankles bilaterally  Pulmonary/Chest: Effort normal and breath sounds normal. No respiratory distress. She has no wheezes. She has no rales. She exhibits no tenderness.  Abdominal: Soft. Bowel sounds are normal. She exhibits no distension.  Musculoskeletal: Normal range of motion.       1+ pitting edema of the lower extremities bilaterally  Neurological: She is alert.  Skin: Skin is warm and dry. She is not diaphoretic.  Psychiatric: She has a normal mood and affect.    ED Course  Procedures (including critical care time)  Labs Reviewed  CBC - Abnormal; Notable for the following:    RBC 3.79 (*)    Hemoglobin 11.6 (*)    HCT 33.9 (*)    All other components within normal limits  BASIC METABOLIC PANEL - Abnormal; Notable for the following:    Creatinine, Ser 1.29 (*)    GFR calc non Af Amer 46 (*)    GFR calc Af Amer 53 (*)    All other components within normal limits  PRO B NATRIURETIC PEPTIDE - Abnormal; Notable for the following:    Pro B Natriuretic peptide (BNP) 289.4 (*)    All other components within normal limits   Dg Chest 2 View  08/16/2011  *RADIOLOGY REPORT*  Clinical Data: Chest pain and shortness of breath  CHEST - 2 VIEW  Comparison: 09/12/2009  Findings: The heart size and mediastinal contours are within normal limits.   Both lungs are clear.  The visualized skeletal structures are unremarkable.  IMPRESSION: Negative exam.  Original Report Authenticated By: Angelita Ingles, M.D.     No diagnosis found.   Date: 08/16/2011  Rate: 60  Rhythm: normal sinus rhythm  QRS Axis: normal  Intervals: normal  ST/T Wave abnormalities: nonspecific T wave changes  Conduction Disutrbances:none  Narrative Interpretation:  New t  wave flattening and slight depression in V5-V6.  T wave inversion in lead III, which is unchanged from old EKG.  Old EKG Reviewed: changes noted  Patient discussed with Dr. Wilson Singer.  3:28 PM Dr. Wilson Singer discussed with [facility].  They report that they will come see patient in the ED and evaluate.    4:30 PM Patient signed out to Ignacia Felling, PA-C.  Cardiologist recommendations pending. MDM  Patient with chest pain, nausea, right arm numbness, and SOB that began approximately 7 hours ago.  Similar symptoms intermittently over the past month.  PMH of CHF and nonischemic cardiomyopathy.  Small changes on EKG.  Initial troponin negative.  [facility] cardiology consulted to evaluate patient.  Ignacia Felling will follow up on their recommendations.        Sherlyn Lees [place], PA-C 08/16/11 1732

## 2011-08-16 NOTE — H&P (Addendum)
Pt. Seen and examined. Agree with the NP/PA-C note as written.  56 yo AA female patient of Dr. Claiborne Billings with lupus, fibromyalgia, NICM with EF recently at 45%. She reports increasing weight and swelling in her legs and belly despite increased dose torsemide. She comes in complaining of chest pressure which has been on and off for 2 months. Today it was somewhat worse, especially when lying down and associated with some shortness of breath. EKG shows some mild T-wave flattening. She received a sublingual nitroglycerin which did improve her symptoms somewhat. Initial cardiac enzymes are negative. She had a negative NST in 2010 and a "normal" cath in 2008.  She is on chronic coumadin for a-fib, however, she is currently in sinus. Physical exam is difficult to determine if she has excess fluid - I don't appreciate a fluid wave in the abdomen and there is a small amount of pitting edema around the ankles, but her midfoot, knees, and ankles are "puffy".  I don't feel that this is ischemic chest pressure, but more likely heart failure. Her BNP is mildly elevated in the 200's.  I recommend stopping IVF's and dosing 40 mg IV lasix now. I would get another set of cardiac markers and if negative she could be discharged. Would recommend an outpatient NST and follow-up with Dr. Claiborne Billings. Would d/c on torsemide 40 mg BID. Also, add low dose imdur 30 mg daily.  Pixie Casino, MD, [facility] Attending Cardiologist [facility]

## 2011-08-16 NOTE — ED Notes (Signed)
Report given to CDU , pt. Transferred to CDU 2,  Spoke with pt. About plan of care, they both verbalized understanding

## 2011-08-16 NOTE — ED Notes (Signed)
Pt c/o intermitent CP since last night, presents with central chest pressure and SOB, no resp dis, no other complaints, A/O X4, NAD

## 2011-08-21 NOTE — ED Provider Notes (Signed)
Medical screening examination/treatment/procedure(s) were performed by non-physician practitioner and as supervising physician I was immediately available for consultation/collaboration.  Virgel Manifold, MD 08/21/11 1249

## 2012-07-04 ENCOUNTER — Other Ambulatory Visit (HOSPITAL_COMMUNITY): Payer: Self-pay | Admitting: Cardiovascular Disease

## 2012-08-01 ENCOUNTER — Ambulatory Visit (HOSPITAL_COMMUNITY)
Admission: RE | Admit: 2012-08-01 | Discharge: 2012-08-01 | Disposition: A | Discharge status: Home / Self Care | Payer: Managed Care, Other (non HMO) | Source: Ambulatory Visit | Attending: Cardiovascular Disease | Admitting: Cardiovascular Disease

## 2012-08-01 DIAGNOSIS — I872 Venous insufficiency (chronic) (peripheral): Secondary | ICD-10-CM

## 2012-08-01 NOTE — Progress Notes (Signed)
Venous Duplex Lower Ext. Completed. [name]

## 2012-08-29 ENCOUNTER — Encounter: Payer: Self-pay | Admitting: Cardiovascular Disease

## 2012-09-24 ENCOUNTER — Telehealth: Payer: Self-pay | Admitting: *Deleted

## 2012-09-24 NOTE — Telephone Encounter (Signed)
Faxed supply orders back.

## 2012-09-25 ENCOUNTER — Telehealth: Payer: Self-pay | Admitting: Pharmacist Clinician (PhC)/ Clinical Pharmacy Specialist

## 2012-09-25 NOTE — Telephone Encounter (Signed)
Message copied by Rockne Menghini on Thu Sep 25, 2012  3:36 PM ------      Message from: Shelva Majestic A      Created: Thu Sep 18, 2012  3:46 PM       LE Reflux noted; support stockings. Arrange evaluation with Dr. Debara Pickett ------

## 2012-10-02 ENCOUNTER — Telehealth: Payer: Self-pay | Admitting: *Deleted

## 2012-10-02 NOTE — Telephone Encounter (Signed)
Per Trixie Dredge this has already been taken care of. She states that the patient already has stockings.

## 2012-10-21 ENCOUNTER — Other Ambulatory Visit: Payer: Self-pay | Admitting: *Deleted

## 2012-10-21 ENCOUNTER — Other Ambulatory Visit: Payer: Self-pay | Admitting: Pharmacist Clinician (PhC)/ Clinical Pharmacy Specialist

## 2012-10-21 MED ORDER — DILTIAZEM HCL 120 MG PO TABS: 120.0000 mg | ORAL_TABLET | Freq: Every day | ORAL | Status: DC

## 2012-10-21 MED ORDER — WARFARIN SODIUM 5 MG PO TABS: ORAL_TABLET | ORAL | Status: DC

## 2012-11-07 ENCOUNTER — Telehealth: Payer: Self-pay | Admitting: *Deleted

## 2012-11-07 NOTE — Telephone Encounter (Signed)
Faxed supply order back.

## 2012-11-10 ENCOUNTER — Other Ambulatory Visit: Payer: Self-pay | Admitting: Pharmacist Clinician (PhC)/ Clinical Pharmacy Specialist

## 2012-11-10 MED ORDER — WARFARIN SODIUM 5 MG PO TABS: ORAL_TABLET | ORAL | Status: DC

## 2012-11-25 ENCOUNTER — Encounter (HOSPITAL_BASED_OUTPATIENT_CLINIC_OR_DEPARTMENT_OTHER): Payer: Self-pay | Admitting: *Deleted

## 2012-11-25 ENCOUNTER — Emergency Department (HOSPITAL_BASED_OUTPATIENT_CLINIC_OR_DEPARTMENT_OTHER)
Admission: EM | Admit: 2012-11-25 | Discharge: 2012-11-25 | Disposition: A | Discharge status: Home / Self Care | Payer: Managed Care, Other (non HMO) | Attending: Emergency Medicine | Admitting: Emergency Medicine

## 2012-11-25 ENCOUNTER — Emergency Department (HOSPITAL_BASED_OUTPATIENT_CLINIC_OR_DEPARTMENT_OTHER): Payer: Managed Care, Other (non HMO)

## 2012-11-25 DIAGNOSIS — T189XXA Foreign body of alimentary tract, part unspecified, initial encounter: Secondary | ICD-10-CM | POA: Insufficient documentation

## 2012-11-25 DIAGNOSIS — T148XXA Other injury of unspecified body region, initial encounter: Secondary | ICD-10-CM

## 2012-11-25 DIAGNOSIS — X58XXXA Exposure to other specified factors, initial encounter: Secondary | ICD-10-CM | POA: Insufficient documentation

## 2012-11-25 DIAGNOSIS — Z8639 Personal history of other endocrine, nutritional and metabolic disease: Secondary | ICD-10-CM | POA: Insufficient documentation

## 2012-11-25 DIAGNOSIS — G4730 Sleep apnea, unspecified: Secondary | ICD-10-CM | POA: Insufficient documentation

## 2012-11-25 DIAGNOSIS — Z862 Personal history of diseases of the blood and blood-forming organs and certain disorders involving the immune mechanism: Secondary | ICD-10-CM | POA: Insufficient documentation

## 2012-11-25 DIAGNOSIS — Y929 Unspecified place or not applicable: Secondary | ICD-10-CM | POA: Insufficient documentation

## 2012-11-25 DIAGNOSIS — Z8679 Personal history of other diseases of the circulatory system: Secondary | ICD-10-CM | POA: Insufficient documentation

## 2012-11-25 DIAGNOSIS — I4891 Unspecified atrial fibrillation: Secondary | ICD-10-CM | POA: Insufficient documentation

## 2012-11-25 DIAGNOSIS — Z87448 Personal history of other diseases of urinary system: Secondary | ICD-10-CM | POA: Insufficient documentation

## 2012-11-25 DIAGNOSIS — IMO0002 Reserved for concepts with insufficient information to code with codable children: Secondary | ICD-10-CM | POA: Insufficient documentation

## 2012-11-25 DIAGNOSIS — I10 Essential (primary) hypertension: Secondary | ICD-10-CM | POA: Insufficient documentation

## 2012-11-25 DIAGNOSIS — Y939 Activity, unspecified: Secondary | ICD-10-CM | POA: Insufficient documentation

## 2012-11-25 DIAGNOSIS — Z8739 Personal history of other diseases of the musculoskeletal system and connective tissue: Secondary | ICD-10-CM | POA: Insufficient documentation

## 2012-11-25 DIAGNOSIS — Z79899 Other long term (current) drug therapy: Secondary | ICD-10-CM | POA: Insufficient documentation

## 2012-11-25 DIAGNOSIS — S1190XA Unspecified open wound of unspecified part of neck, initial encounter: Secondary | ICD-10-CM | POA: Insufficient documentation

## 2012-11-25 MED ORDER — ONDANSETRON 4 MG PO TBDP
4.0000 mg | ORAL_TABLET | Freq: Once | ORAL | Status: AC
  Administered 2012-11-25: 4 mg via ORAL
  Filled 2012-11-25: qty 1

## 2012-11-25 MED ORDER — BENZOCAINE (TOPICAL) 20 % EX AERO
INHALATION_SPRAY | Freq: Four times a day (QID) | CUTANEOUS | Status: DC | PRN
Start: 2012-11-25 — End: 2012-11-25
  Administered 2012-11-25: 01:00:00 via OROMUCOSAL
  Filled 2012-11-25 (×2): qty 57

## 2012-11-25 MED ORDER — CLINDAMYCIN HCL 300 MG PO CAPS: 300.0000 mg | ORAL_CAPSULE | Freq: Three times a day (TID) | ORAL | Status: DC

## 2012-11-25 MED ORDER — CLINDAMYCIN HCL 150 MG PO CAPS
300.0000 mg | ORAL_CAPSULE | Freq: Once | ORAL | Status: AC
  Administered 2012-11-25: 300 mg via ORAL
  Filled 2012-11-25: qty 2

## 2012-11-25 NOTE — ED Provider Notes (Signed)
CSN: [id number]     Arrival date & time 11/25/12  0038 History     First MD Initiated Contact with Patient 11/25/12 0106     Chief Complaint  Patient presents with  . Swallowed Foreign Body   HPI Caitlyn Fuentes is a 57 y.o. female presents with throat pain. Patient was eating fish, which she felt a fish bone poke her in the back of the throat.  She has the sensation that the bone is still stuck. She tried to eat some bread to the bone down but throughout the breath.  Patient is mildly nauseous now, no abdominal pain, no chest pain, no shortness of breath. Is had no fevers, no chills, no recent illness. Patient's pain is moderate to severe, and the back of her throat, nonradiating, she feels worse when she swallows.   Past Medical History  Diagnosis Date  . A-fib   . Cardiomyopathy   . Sleep apnea 2008    CPAP  . Thyroid disease   . Renal disorder   . Lupus   . Hypertension   . Pulmonary hypertension 05/10/2011    Echo, EF-40-45  . Chest pain 09/21/2011    Nuc,    Past Surgical History  Procedure Laterality Date  . Cardiac catheterization      Nonischemic, EF-40, continued medical therapy  . Cardiac catheterization  10/02/2006    No significant CAD   No family history on file. History  Substance Use Topics  . Smoking status: Never Smoker   . Smokeless tobacco: Not on file  . Alcohol Use: Yes   OB History   Grav Para Term Preterm Abortions TAB SAB Ect Mult Living                 Review of Systems At least 10pt or greater review of systems completed and are negative except where specified in the HPI.  Allergies  Diovan and Lisinopril  Home Medications   Current Outpatient Rx  Name  Route  Sig  Dispense  Refill  . atorvastatin (LIPITOR) 20 MG tablet   Oral   Take 20 mg by mouth daily.         . carvedilol (COREG CR) 80 MG 24 hr capsule   Oral   Take 80 mg by mouth daily.         . celecoxib (CELEBREX) 200 MG capsule   Oral   Take 200 mg by mouth 2  (two) times daily.         . ciclopirox (PENLAC) 8 % solution   Topical   Apply 1 application topically at bedtime. Apply over nail and surrounding skin. Apply daily over previous coat. After seven (7) days, may remove with alcohol and continue cycle.  To toenails         . diltiazem (CARDIZEM) 120 MG tablet   Oral   Take 1 tablet (120 mg total) by mouth at bedtime.   90 tablet   3   . estradiol (VIVELLE-DOT) 0.075 MG/24HR   Transdermal   Place 1 patch onto the skin 2 (two) times a week. Sunday and Thursday         . ferrous sulfate 325 (65 FE) MG tablet   Oral   Take 325 mg by mouth daily with breakfast.         . Multiple Vitamin (MULITIVITAMIN WITH MINERALS) TABS   Oral   Take 1 tablet by mouth daily.         . paricalcitol (  ZEMPLAR) 1 MCG capsule   Oral   Take 1 mcg by mouth daily.         . potassium chloride 20 MEQ/15ML (10%) solution   Oral   Take 20 mEq by mouth daily.         Marland Kitchen torsemide (DEMADEX) 20 MG tablet   Oral   Take 20 mg by mouth 2 (two) times daily. 2 tablets in the morning 1 tablet at night         . warfarin (COUMADIN) 5 MG tablet      Take 1-1.5 tablets by mouth daily as directed.   135 tablet   0   . zolpidem (AMBIEN) 10 MG tablet   Oral   Take 10 mg by mouth at bedtime.          BP 174/100  Pulse 94  Temp(Src) 98.6 F (37 C) (Oral)  Resp 18  Ht 5\' 4"  (1.626 m)  Wt 260 lb (117.935 kg)  BMI 44.61 kg/m2  SpO2 98% Physical Exam  HENT:  Mouth/Throat:      ED Course    Nursing notes reviewed.  Electronic medical record reviewed. VITAL SIGNS:   Filed Vitals:   11/25/12 0047  BP: 174/100  Pulse: 94  Temp: 98.6 F (37 C)  TempSrc: Oral  Resp: 18  Height: 5\' 4"  (1.626 m)  Weight: 260 lb (117.935 kg)  SpO2: 98%   CONSTITUTIONAL: Awake, oriented, appears non-toxic,  NAD, nor respiratory difficulty. HENT: Atraumatic, normocephalic, oral mucosa pink and moist, airway patent. No swelling or erythema.  Pinpoint puncture wound as depicted at proximal aspect of soft palate arch, anterior to tonsil location (s/p tonsillectomy.) Nares patent without drainage. External ears normal. EYES: Conjunctiva clear, EOMI, PERRLA NECK: Trachea midline, non-tender, supple CARDIOVASCULAR: Normal heart rate, Normal rhythm, No murmurs, rubs, gallops PULMONARY/CHEST: Clear to auscultation, no rhonchi, wheezes, or rales. Symmetrical breath sounds. Non-tender. ABDOMINAL: Non-distended, soft, non-tender - no rebound or guarding.  BS normal. NEUROLOGIC: Non-focal, moving all four extremities, no gross sensory or motor deficits. EXTREMITIES: No clubbing, cyanosis, or edema SKIN: Warm, Dry, No erythema, No rash  Procedures (including critical care time)  Labs Reviewed - No data to display Dg Neck Soft Tissue  11/25/2012   *RADIOLOGY REPORT*  Clinical Data: Possible foreign body.  NECK SOFT TISSUES - 1+ VIEW  Comparison: None.  Findings: No foreign body is identified.  The airway is patent. Epiglottis and aryepiglottic folds appear normal.  Degenerative disc disease C5-6 noted.  IMPRESSION: Negative for foreign body.  No acute abnormality.   Original Report Authenticated By: Orlean Patten, M.D.   1. Puncture wound   2. Swallowed foreign body, initial encounter     MDM  When I examined the patient, and placed a Q-tip swab over the area of the pinpoint puncture wound, she said this is where she is having pain. I do not see any foreign bodies at this point. There is no protruding bone, there is no surrounding erythema or swelling. X-ray of the neck is understandably negative. I do not think this was a substantial sized fish bone.  I do not see where the stone is retained, she's having no symptoms of esophageal perforation, or involvement of anything below the pharynx.  Do not think this patient has a foreign body in the lower, I do not think that this bone has caused a perforation anywhere lower in the GI tract either.   Because of the puncture, we'll start clindamycin.  Patient was  observed for a time in numerous department she started to feel much better, she's no longer spinning, she is not nauseous, she's had no more vomiting. She has minimal discomfort when she swallows. I did use Hurricaine spray on the patient's throat on secondary exam however the patient has not had a repeat dosing, out of expected her pain to get worse if the bone was retained.  I think the patient is safe for discharge, I have discussed this patient's case with Dr. Simeon Craft, she can followup as an outpatient with ENT for followup pharyngeal endoscopy.    I explained the diagnosis and have given explicit precautions to return to the ER including difficulty breathing, worsening pain, fever or any other new or worsening symptoms. The patient understands and accepts the medical plan as it's been dictated and I have answered their questions. Discharge instructions concerning home care and prescriptions have been given.  The patient is STABLE and is discharged to home in good condition.   Rhunette Croft, MD 11/25/12 [phone]

## 2012-11-25 NOTE — ED Notes (Signed)
Patient transported to X-ray 

## 2012-11-25 NOTE — ED Notes (Signed)
MD at bedside. 

## 2012-11-25 NOTE — ED Notes (Signed)
Pt. Reports eating fresh caught Brim Fish this night and a bone got lodged in the R side of her throat.  Pt. Spiting  And reports she tried to eat bread but gagged and threw up the bread.

## 2012-11-25 NOTE — ED Notes (Signed)
Pt declines hurricane spray at this time. Pt able to swallow pills and PO fluids with diff but c/o pain.

## 2012-12-04 ENCOUNTER — Other Ambulatory Visit: Payer: Self-pay | Admitting: Cardiovascular Disease

## 2012-12-04 NOTE — Telephone Encounter (Signed)
Rx was sent to pharmacy electronically. 

## 2013-02-03 ENCOUNTER — Other Ambulatory Visit: Payer: Self-pay | Admitting: Cardiovascular Disease

## 2013-02-03 NOTE — Telephone Encounter (Signed)
Rx was sent to pharmacy electronically. 

## 2013-03-16 ENCOUNTER — Other Ambulatory Visit: Payer: Self-pay | Admitting: Gynecology

## 2013-03-16 DIAGNOSIS — N6320 Unspecified lump in the left breast, unspecified quadrant: Secondary | ICD-10-CM

## 2013-04-01 ENCOUNTER — Other Ambulatory Visit: Payer: Managed Care, Other (non HMO)

## 2013-04-09 ENCOUNTER — Ambulatory Visit
Admission: RE | Admit: 2013-04-09 | Discharge: 2013-04-09 | Disposition: A | Discharge status: Home / Self Care | Payer: Private Health Insurance - Indemnity | Source: Ambulatory Visit | Attending: Gynecology | Admitting: Gynecology

## 2013-04-09 DIAGNOSIS — N6320 Unspecified lump in the left breast, unspecified quadrant: Secondary | ICD-10-CM

## 2013-05-28 ENCOUNTER — Encounter: Payer: Self-pay | Admitting: Podiatrist

## 2013-05-28 ENCOUNTER — Ambulatory Visit (INDEPENDENT_AMBULATORY_CARE_PROVIDER_SITE_OTHER): Payer: Managed Care, Other (non HMO) | Admitting: Podiatrist

## 2013-05-28 VITALS — BP 164/64 | HR 86 | Resp 12 | Ht 64.0 in | Wt 254.0 lb

## 2013-05-28 DIAGNOSIS — M109 Gout, unspecified: Secondary | ICD-10-CM

## 2013-05-28 DIAGNOSIS — L0390 Cellulitis, unspecified: Secondary | ICD-10-CM

## 2013-05-28 DIAGNOSIS — L0291 Cutaneous abscess, unspecified: Secondary | ICD-10-CM

## 2013-05-28 MED ORDER — CLINDAMYCIN HCL 300 MG PO CAPS: 300.0000 mg | ORAL_CAPSULE | Freq: Three times a day (TID) | ORAL | Status: DC

## 2013-05-28 MED ORDER — COLCHICINE 0.6 MG PO TABS: 0.6000 mg | ORAL_TABLET | Freq: Every day | ORAL | Status: DC

## 2013-05-28 NOTE — Progress Notes (Signed)
   Subjective:    Patient ID: Caitlyn Fuentes, female    DOB: 06-07-1955, 58 y.o.   MRN: [id number]  HPI '' LT FOOT GREAT TOE IS SWOLLEN AND PAINFUL FOR OVER A WEEK. TREATMENT TRIED CORTISONE.'' Patient relates to the left great toe suddenly became swollen and painful. She states it's) the nail and she denies any trauma or injury to the foot or toe. She saw her primary care physician who gave her a cortisone injection in her hip and related no relief in symptoms. She denies any nausea, vomiting, fevers, chills, night sweats or systemic signs of infection. She denies any open lesions to the foot. She had x-rays performed her primary care physician.    Review of Systems  Constitutional: Positive for fatigue.  Respiratory: Positive for cough.   Cardiovascular: Positive for leg swelling.  Musculoskeletal: Positive for back pain, gait problem, joint swelling and myalgias.  Hematological: Bruises/bleeds easily.  All other systems reviewed and are negative.       Objective:   Physical Exam GENERAL APPEARANCE: Alert, conversant. Appropriately groomed. No acute distress.  VASCULAR: Pedal pulses palpable at 2/4 DP and PT bilateral.  Capillary refill time is immediate to all digits,  Proximal to distal cooling it warm to warm.  Digital hair growth is present bilateral  NEUROLOGIC: sensation is intact epicritically and protectively to 5.07 monofilament at 5/5 sites bilateral.  Light touch is intact bilateral, vibratory sensation intact bilateral, achilles tendon reflex is intact bilateral.  MUSCULOSKELETAL: Swelling is noted on the dorsal aspect of the left hallux just proximal to the nailbed. Discomfort with pressure is noted. Discomfort with range of motion is also present. No calor or rubor is present.  DERMATOLOGIC: Swelling of the left great toe and left foot is noted. No other dermatological concerns are noted.  X-rays are reviewed off of the canopy system. Small area of cortical abnormality on  the dorsal aspect of the distal phalanx only seen a lateral film. Soft tissue swelling is also present on the lateral film as well. No obvious sign of fracture or dislocation is present.    Assessment & Plan:  Gout versus cellulitis versus small fracture left hallux  Plan: Recommended a lab test to rule out gout versus infective process. She have this done today or tomorrow. Started on clindamycin 300 mg 1 tab by mouth 3 times a day as well as colchicine 0.6 mg 1 tab by mouth daily. I will see her back in one week and hopefully will have some clarity as to her definitive diagnosis at that time. She's also given instructions on elevation and warm compresses. Of note I did speak with her nurse Stanton Kidney and discussed my treatment options. She agreed.

## 2013-05-28 NOTE — Patient Instructions (Signed)
Gout Gout is when your joints become red, sore, and swell (inflammed). This is caused by the buildup of uric acid crystals in the joints. Uric acid is a chemical that is normally in the blood. If the level of uric acid gets too high in the blood, these crystals form in your joints and tissues. Over time, these crystals can form into masses near the joints and tissues. These masses can destroy bone and cause the bone to look misshapen (deformed). HOME CARE   Do not take aspirin for pain.  Only take medicine as told by your doctor.  Rest the joint as much as you can. When in bed, keep sheets and blankets off painful areas.  Keep the sore joints raised (elevated).  Put warm or cold packs on painful joints. Use of warm or cold packs depends on which works best for you.  Use crutches if the painful joint is in your leg.  Drink enough fluids to keep your pee (urine) clear or pale yellow. Limit alcohol, sugary drinks, and drinks with fructose in them.  Follow your diet instructions. Pay careful attention to how much protein you eat. Include fruits, vegetables, whole grains, and fat-free or low-fat milk products in your daily diet. Talk to your doctor or dietician about the use of coffee, vitamin C, and cherries. These may help lower uric acid levels.  Keep a healthy body weight. GET HELP RIGHT AWAY IF:   You have watery poop (diarrhea), throw up (vomit), or have any side effects from medicines.  You do not feel better in 24 hours, or you are getting worse.  Your joint becomes suddenly more tender, and you have chills or a fever. MAKE SURE YOU:   Understand these instructions.  Will watch your condition.  Will get help right away if you are not doing well or get worse. Document Released: 01/17/2008 Document Revised: 08/04/2012 Document Reviewed: 07/18/2009 [facility] Patient Information 2014 [place].  Cellulitis Cellulitis is an infection of the skin and the tissue beneath it.  The infected area is usually red and tender. Cellulitis occurs most often in the arms and lower legs.  CAUSES  Cellulitis is caused by bacteria that enter the skin through cracks or cuts in the skin. The most common types of bacteria that cause cellulitis are Staphylococcus and Streptococcus. SYMPTOMS   Redness and warmth.  Swelling.  Tenderness or pain.  Fever. DIAGNOSIS  Your caregiver can usually determine what is wrong based on a physical exam. Blood tests may also be done. TREATMENT  Treatment usually involves taking an antibiotic medicine. HOME CARE INSTRUCTIONS   Take your antibiotics as directed. Finish them even if you start to feel better.  Keep the infected arm or leg elevated to reduce swelling.  Apply a warm cloth to the affected area up to 4 times per day to relieve pain.  Only take over-the-counter or prescription medicines for pain, discomfort, or fever as directed by your caregiver.  Keep all follow-up appointments as directed by your caregiver. SEEK MEDICAL CARE IF:   You notice red streaks coming from the infected area.  Your red area gets larger or turns dark in color.  Your bone or joint underneath the infected area becomes painful after the skin has healed.  Your infection returns in the same area or another area.  You notice a swollen bump in the infected area.  You develop new symptoms. SEEK IMMEDIATE MEDICAL CARE IF:   You have a fever.  You feel very  sleepy.  You develop vomiting or diarrhea.  You have a general ill feeling (malaise) with muscle aches and pains. MAKE SURE YOU:   Understand these instructions.  Will watch your condition.  Will get help right away if you are not doing well or get worse. Document Released: 01/17/2005 Document Revised: 10/09/2011 Document Reviewed: 06/25/2011 [facility] Patient Information 2014 [place].

## 2013-05-29 ENCOUNTER — Other Ambulatory Visit: Payer: Self-pay | Admitting: Pharmacist Clinician (PhC)/ Clinical Pharmacy Specialist

## 2013-05-29 LAB — CBC WITH DIFFERENTIAL/PLATELET
BASOS ABS: 0 10*3/uL (ref 0.0–0.1)
Basophils Relative: 1 % (ref 0–1)
Eosinophils Absolute: 0.1 10*3/uL (ref 0.0–0.7)
Eosinophils Relative: 3 % (ref 0–5)
HEMATOCRIT: 36.7 % (ref 36.0–46.0)
Hemoglobin: 12.3 g/dL (ref 12.0–15.0)
LYMPHS ABS: 1.5 10*3/uL (ref 0.7–4.0)
LYMPHS PCT: 37 % (ref 12–46)
MCH: 30.1 pg (ref 26.0–34.0)
MCHC: 33.5 g/dL (ref 30.0–36.0)
MCV: 89.7 fL (ref 78.0–100.0)
MONO ABS: 0.4 10*3/uL (ref 0.1–1.0)
Monocytes Relative: 11 % (ref 3–12)
NEUTROS ABS: 2 10*3/uL (ref 1.7–7.7)
Neutrophils Relative %: 48 % (ref 43–77)
Platelets: 425 10*3/uL — ABNORMAL HIGH (ref 150–400)
RBC: 4.09 MIL/uL (ref 3.87–5.11)
RDW: 13.9 % (ref 11.5–15.5)
WBC: 4.2 10*3/uL (ref 4.0–10.5)

## 2013-05-30 LAB — URIC ACID: URIC ACID, SERUM: 9.5 mg/dL — AB (ref 2.4–7.0)

## 2013-05-30 LAB — SEDIMENTATION RATE: Sed Rate: 34 mm/hr — ABNORMAL HIGH (ref 0–22)

## 2013-05-30 LAB — C-REACTIVE PROTEIN

## 2013-06-02 ENCOUNTER — Telehealth: Payer: Self-pay | Admitting: *Deleted

## 2013-06-02 NOTE — Telephone Encounter (Signed)
Message copied by Andres Ege on Tue Jun 02, 2013  4:45 PM ------      Message from: Bronson Ing      Created: Tue Jun 02, 2013 12:24 PM       Uric Acid level is high indicating Gout-  WBC's low making diagnosis less likely there is an infection present. Please notify patient of these lab results. ------

## 2013-06-02 NOTE — Progress Notes (Signed)
Thanks Dr. Valentina Lucks. I faxed the patient her results. I tried calling her to let her know about the gout but I had to leave her a message so I just told her to call me back. If I don't hear back from her by 3pm I'll try calling her again. Thanks Dr. Valentina Lucks

## 2013-06-02 NOTE — Telephone Encounter (Signed)
Left a message to for lab results.

## 2013-06-04 ENCOUNTER — Telehealth: Payer: Self-pay | Admitting: *Deleted

## 2013-06-04 NOTE — Telephone Encounter (Signed)
Informed pt that the uric acid was high and indicated gout, continue the Colchicine.  Pt agreed.

## 2013-06-05 ENCOUNTER — Encounter: Payer: Self-pay | Admitting: Podiatrist

## 2013-06-05 ENCOUNTER — Ambulatory Visit (INDEPENDENT_AMBULATORY_CARE_PROVIDER_SITE_OTHER): Payer: Managed Care, Other (non HMO) | Admitting: Podiatrist

## 2013-06-05 VITALS — BP 127/78 | HR 84 | Resp 18

## 2013-06-05 DIAGNOSIS — M109 Gout, unspecified: Secondary | ICD-10-CM

## 2013-06-05 NOTE — Patient Instructions (Signed)
Make an appointment with Dr. Ouida Sills to treat your gout long term

## 2013-06-05 NOTE — Progress Notes (Signed)
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 She states that the swelling subsided and she no longer has pain in the toe itself. She also states she can wiggle the toe up and down without discomfort. She denies any problems with the medication and states that she stopped taking the antibiotic as instructed.  Objective: Neurovascular status is unchanged from her last visit. The swelling and redness is significantly decreased on the left foot. The labs showed positive gout with elevated uric acid level of 9.5. The labs were sent to the patient so she may share them with her primary care physician.  Assessment: Gout  Plan: Gave the patient a gout diet. Recommended she followup with her rheumatologist Dr. Tobie Lords to lower the level of the uric acid as he has a gout based clinic and will be we'll follow her closely and monitor her other issues. Also discussed with her that her white blood cell count was low indicating little concern for infection. She will be seen back as needed for followup or if she has any problems or concerns in the future at this point she will see Dr. Ouida Sills and she would like to make her own appointment as she is already his patient.

## 2013-07-13 ENCOUNTER — Other Ambulatory Visit: Payer: Self-pay | Admitting: Pharmacist Clinician (PhC)/ Clinical Pharmacy Specialist

## 2013-07-20 ENCOUNTER — Ambulatory Visit (INDEPENDENT_AMBULATORY_CARE_PROVIDER_SITE_OTHER): Payer: Private Health Insurance - Indemnity | Admitting: Pharmacist Clinician (PhC)/ Clinical Pharmacy Specialist

## 2013-07-20 DIAGNOSIS — I4891 Unspecified atrial fibrillation: Secondary | ICD-10-CM

## 2013-07-20 DIAGNOSIS — I480 Paroxysmal atrial fibrillation: Secondary | ICD-10-CM

## 2013-07-20 DIAGNOSIS — Z7901 Long term (current) use of anticoagulants: Secondary | ICD-10-CM | POA: Insufficient documentation

## 2013-07-20 LAB — POCT INR: INR: 1.5

## 2013-07-31 ENCOUNTER — Ambulatory Visit: Payer: Managed Care, Other (non HMO) | Admitting: Podiatrist

## 2013-08-31 ENCOUNTER — Other Ambulatory Visit: Payer: Self-pay | Admitting: Internal Medicine

## 2013-08-31 ENCOUNTER — Ambulatory Visit
Admission: RE | Admit: 2013-08-31 | Discharge: 2013-08-31 | Disposition: A | Discharge status: Home / Self Care | Payer: Private Health Insurance - Indemnity | Source: Ambulatory Visit | Attending: Internal Medicine | Admitting: Internal Medicine

## 2013-08-31 DIAGNOSIS — R109 Unspecified abdominal pain: Secondary | ICD-10-CM

## 2013-09-07 ENCOUNTER — Ambulatory Visit (INDEPENDENT_AMBULATORY_CARE_PROVIDER_SITE_OTHER): Payer: Private Health Insurance - Indemnity | Admitting: Cardiovascular Disease

## 2013-09-25 ENCOUNTER — Ambulatory Visit (INDEPENDENT_AMBULATORY_CARE_PROVIDER_SITE_OTHER): Payer: Private Health Insurance - Indemnity | Admitting: Cardiovascular Disease

## 2013-09-25 ENCOUNTER — Encounter: Payer: Self-pay | Admitting: Cardiovascular Disease

## 2013-09-25 VITALS — BP 130/78 | HR 78 | Ht 64.0 in | Wt 263.2 lb

## 2013-09-25 DIAGNOSIS — R600 Localized edema: Secondary | ICD-10-CM

## 2013-09-25 DIAGNOSIS — Z9989 Dependence on other enabling machines and devices: Secondary | ICD-10-CM

## 2013-09-25 DIAGNOSIS — M1A9XX0 Chronic gout, unspecified, without tophus (tophi): Secondary | ICD-10-CM

## 2013-09-25 DIAGNOSIS — E785 Hyperlipidemia, unspecified: Secondary | ICD-10-CM

## 2013-09-25 DIAGNOSIS — I480 Paroxysmal atrial fibrillation: Secondary | ICD-10-CM

## 2013-09-25 DIAGNOSIS — I4891 Unspecified atrial fibrillation: Secondary | ICD-10-CM

## 2013-09-25 DIAGNOSIS — G4733 Obstructive sleep apnea (adult) (pediatric): Secondary | ICD-10-CM

## 2013-09-25 DIAGNOSIS — M1A00X0 Idiopathic chronic gout, unspecified site, without tophus (tophi): Secondary | ICD-10-CM

## 2013-09-25 DIAGNOSIS — Z7901 Long term (current) use of anticoagulants: Secondary | ICD-10-CM

## 2013-09-25 DIAGNOSIS — R609 Edema, unspecified: Secondary | ICD-10-CM

## 2013-09-25 MED ORDER — POTASSIUM CHLORIDE 20 MEQ/15ML (10%) PO LIQD: 20.0000 meq | Freq: Every day | ORAL | Status: DC

## 2013-09-25 NOTE — Patient Instructions (Addendum)
Your physician recommends that you schedule a follow-up appointment in: 6 months. No changes have been made today in your therapy.  A prescription will be sent to [facility] for you to get a new CPAP machine.

## 2013-09-28 ENCOUNTER — Telehealth: Payer: Self-pay | Admitting: *Deleted

## 2013-09-28 NOTE — Telephone Encounter (Signed)
[facility] to request a number to fax order for new CPAP machine. Was transferred to the location where the orders are processed and spoke with Abe People who informs me that he will be sending the necessary paperwork for the patient's new unit. States that they cannot start the process until they speak with the patient first. Will await the fax from [facility] and last week's  office note from the patient's  visit with Dr. Claiborne Billings.

## 2013-10-09 ENCOUNTER — Other Ambulatory Visit: Payer: Self-pay | Admitting: Pharmacist Clinician (PhC)/ Clinical Pharmacy Specialist

## 2013-10-09 ENCOUNTER — Other Ambulatory Visit: Payer: Self-pay | Admitting: *Deleted

## 2013-10-09 MED ORDER — DILTIAZEM HCL ER COATED BEADS 120 MG PO CP24: ORAL_CAPSULE | ORAL | Status: DC

## 2013-10-09 NOTE — Telephone Encounter (Signed)
Rx was sent to pharmacy electronically. 

## 2013-10-16 ENCOUNTER — Encounter: Payer: Self-pay | Admitting: Cardiovascular Disease

## 2013-10-16 DIAGNOSIS — E785 Hyperlipidemia, unspecified: Secondary | ICD-10-CM | POA: Insufficient documentation

## 2013-10-16 DIAGNOSIS — M109 Gout, unspecified: Secondary | ICD-10-CM | POA: Insufficient documentation

## 2013-10-16 NOTE — Progress Notes (Signed)
Patient ID: Caitlyn Fuentes, female   DOB: 1956-04-18, 58 y.o.   MRN: [id number]     HPI: Caitlyn Fuentes is a 58 y.o. female who presents to the office today for a 14 month follow up cardiology evaluation.  Caitlyn Fuentes has a history of previous nonischemic cardiopathy, when she initially presented with atrial fibrillation in 2008.  Cardiac catheterization did not show significant coronary obstructive disease and was found to have mild pulmonary hypertension.  She's been documented as having obstructive sleep apnea since 2008 and has been using CPAP.  Recently, she has had machine malfunction, and is in need for new machine.  Has issues with obesity, hypertension, mild renal insufficiency, as well as lower extremity edema.  There also is a history of lupus.  She has been felt to have venous reflux disease with deep venous insufficiency within the left common femoral vein and right and left greater saphenous veins have demonstrated valvular insufficiency, as well as the left short saphenous vein.  She did see Dr. Debara Pickett for evaluation.  Over the past year, she denies any episodes of chest pain.  He also has a history of gout.  Her last sleep study was in 2013.  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  She is continued have issues with obesity and denies any weight loss.  She does note occasional shortness of breath with activity.  Past Medical History  Diagnosis Date  . A-fib   . Cardiomyopathy   . Sleep apnea 2008    CPAP  . Thyroid disease   . Renal disorder   . Lupus   . Hypertension   . Pulmonary hypertension 05/10/2011    Echo, EF-40-45  . Chest pain 09/21/2011    Nuc,     Past Surgical History  Procedure Laterality Date  . Cardiac catheterization      Nonischemic, EF-40, continued medical therapy  . Cardiac catheterization  10/02/2006    No significant CAD    Allergies  Allergen Reactions  . Diovan [Valsartan] Other (See Comments)    Messes up kidneys  .  Lisinopril Swelling    Facial swelling    Current Outpatient Prescriptions  Medication Sig Dispense Refill  . allopurinol (ZYLOPRIM) 100 MG tablet Take 100 mg by mouth daily.      Marland Kitchen atorvastatin (LIPITOR) 20 MG tablet TAKE 1 TABLET BY MOUTH EVERY DAY  90 tablet  2  . carvedilol (COREG CR) 80 MG 24 hr capsule Take 80 mg by mouth daily.      . celecoxib (CELEBREX) 200 MG capsule Take 200 mg by mouth 2 (two) times daily.      . colchicine 0.6 MG tablet Take 1 tablet (0.6 mg total) by mouth daily.  20 tablet  0  . estradiol (VIVELLE-DOT) 0.075 MG/24HR Place 1 patch onto the skin 2 (two) times a week. Sunday and Thursday      . ferrous sulfate 325 (65 FE) MG tablet Take 325 mg by mouth daily with breakfast.      . Multiple Vitamin (MULITIVITAMIN WITH MINERALS) TABS Take 1 tablet by mouth daily.      . paricalcitol (ZEMPLAR) 1 MCG capsule Take 1 mcg by mouth daily.      . potassium chloride 20 MEQ/15ML (10%) solution Take 15 mLs (20 mEq total) by mouth daily.  500 mL  2  . torsemide (DEMADEX) 20 MG tablet Take 20 mg by mouth 2 (two) times daily. 2 tablets in the morning 1 tablet at  night      . zolpidem (AMBIEN) 10 MG tablet Take 10 mg by mouth at bedtime as needed.       . diltiazem (CARDIZEM CD) 120 MG 24 hr capsule TAKE 1 TABLET BY MOUTH EVERY DAY AT BEDTIME  90 capsule  3  . warfarin (COUMADIN) 5 MG tablet TAKE 1 TO 1 AND 1/2 TABLET BY MOUTH EVERY DAY AS DIRECTED  45 tablet  0   No current facility-administered medications for this visit.    History   Social History  . Marital Status: Married    Spouse Name: N/A    Number of Children: N/A  . Years of Education: N/A   Occupational History  . Not on file.   Social History Main Topics  . Smoking status: Never Smoker   . Smokeless tobacco: Not on file  . Alcohol Use: Yes  . Drug Use: No  . Sexual Activity: Not on file   Other Topics Concern  . Not on file   Social History Narrative  . No narrative on file   Socially, she is  married and has 4 children, one grandchild.  There is no tobacco or alcohol use.  She does not routinely exercise.  Family History  Problem Relation Age of Onset  . Heart disease Mother   . Heart disease Father     ROS General: Negative; No fevers, chills, or night sweats HEENT: Negative; No changes in vision or hearing, sinus congestion, difficulty swallowing Pulmonary: Negative; No cough, wheezing, shortness of breath, hemoptysis Cardiovascular: See HPI: No chest pain, presyncope, syncope, palpatations, intermoitent leg swelling with venous insufficiency GI: Negative; No nausea, vomiting, diarrhea, or abdominal pain GU: Negative; No dysuria, hematuria, or difficulty voiding Musculoskeletal: Negative; no myalgias, joint pain, or weakness Hematologic: Negative; no easy bruising, bleeding Endocrine: Negative; no heat/cold intolerance; no diabetes, Neuro: Negative; no changes in balance, headaches Skin:  Malar rash Psychiatric: Negative; No behavioral problems, depression Sleep:  Positive for sleep apnea;  No snoring, daytime sleepiness, hypersomnolence, bruxism, restless legs, hypnogognic hallucinations. Other comprehensive 14 point system review is negative   Physical Exam BP 130/78  Pulse 78  Ht 5\' 4"  (1.626 m)  Wt 263 lb 3.2 oz (119.387 kg)  BMI 45.16 kg/m2 General: Alert, oriented, no distress.  Morbid obesity Skin: normal turgor, no rashes, warm and dry HEENT: Normocephalic, atraumatic. Pupils equal round and reactive to light; sclera anicteric; extraocular muscles intact, No lid lag; Nose without nasal septal hypertrophy; Mouth/Parynx benign; Mallinpatti scale 3/4 Neck: Thick neck No JVD, no carotid bruits; normal carotid upstroke Lungs: clear to ausculatation and percussion bilaterally; no wheezing or rales, normal inspiratory and expiratory effort Chest wall: without tenderness to palpitation Heart: PMI not displaced, RRR, s1 s2 normal, 2/6systolic murmur, No diastolic  murmur, no rubs, gallops, thrills, or heaves Abdomen: Moderately severe central adiposity soft, nontender; no hepatosplenomehaly, BS+; abdominal aorta nontender and not dilated by palpation. Back: no CVA tenderness Pulses: 2+  Musculoskeletal: full range of motion, normal strength, no joint deformities Extremities: Pulses 2+, venous stasis changes;no clubbing cyanosis; Homan's sign negative  Neurologic: grossly nonfocal; Cranial nerves grossly wnl Psychologic: Normal mood and affect   ECG (independently read by me): Normal sinus rhythm at 78 beats per minute.  LVH by voltage criteria in aVL.  No significant ST changes. QTc interval 437 Caitlyn.  LABS:  BMET    Component Value Date/Time   NA 143 08/16/2011 1321   K 3.7 08/16/2011 1321   CL 102 08/16/2011  1321   CO2 30 08/16/2011 1321   GLUCOSE 98 08/16/2011 1321   BUN 19 08/16/2011 1321   CREATININE 1.29* 08/16/2011 1321   CALCIUM 9.8 08/16/2011 1321   GFRNONAA 46* 08/16/2011 1321   GFRAA 53* 08/16/2011 1321     Hepatic Function Panel     Component Value Date/Time   PROT 6.2 09/13/2009 0355   ALBUMIN 3.3* 09/13/2009 0355   AST 16 09/13/2009 0355   ALT 17 09/13/2009 0355   ALKPHOS 46 09/13/2009 0355   BILITOT 0.7 09/13/2009 0355     CBC    Component Value Date/Time   WBC 4.2 05/28/2013 1617   RBC 4.09 05/28/2013 1617   HGB 12.3 05/28/2013 1617   HCT 36.7 05/28/2013 1617   PLT 425* 05/28/2013 1617   MCV 89.7 05/28/2013 1617   MCH 30.1 05/28/2013 1617   MCHC 33.5 05/28/2013 1617   RDW 13.9 05/28/2013 1617   LYMPHSABS 1.5 05/28/2013 1617   MONOABS 0.4 05/28/2013 1617   EOSABS 0.1 05/28/2013 1617   BASOSABS 0.0 05/28/2013 1617     BNP    Component Value Date/Time   PROBNP 289.4* 08/16/2011 1320    Lipid Panel     Component Value Date/Time   CHOL  Value: 112        ATP III CLASSIFICATION:  <200     mg/dL   Desirable  200-239  mg/dL   Borderline High  >=240    mg/dL   High        09/13/2009 0355   TRIG 86 09/13/2009 0355   HDL 39* 09/13/2009 0355    CHOLHDL 2.9 09/13/2009 0355   VLDL 17 09/13/2009 0355   LDLCALC  Value: 56        Total Cholesterol/HDL:CHD Risk Coronary Heart Disease Risk Table                     Men   Women  1/2 Average Risk   3.4   3.3  Average Risk       5.0   4.4  2 X Average Risk   9.6   7.1  3 X Average Risk  23.4   11.0        Use the calculated Patient Ratio above and the CHD Risk Table to determine the patient's CHD Risk.        ATP III CLASSIFICATION (LDL):  <100     mg/dL   Optimal  100-129  mg/dL   Near or Above                    Optimal  130-159  mg/dL   Borderline  160-189  mg/dL   High  >190     mg/dL   Very High 09/13/2009 0355     RADIOLOGY: No results found.    ASSESSMENT AND PLAN: Caitlyn. Fuentes has a history of previous non-ischemic heart myopathy with an ejection fraction of approximately 35% initially.  Subsequent echo Doppler study in 2013 showed her ejection fraction at 40-45%.  She has been maintaining normal sinus rhythm and has not had recurrent atrial fibrillation.  She does have venous insufficiency and has support stockings.  She has a history of hypertension.  Her blood pressure today is well controlled on carvedilol, CR 80 mg in addition to her Cardizem CD 120 mg and torsemide.  She is on Coumadin anticoagulation.  She does have obstructive sleep apnea and has had recent machine malfunction of the machine  only working intermittently.  She needs a new CPAP machine.  We will write a prescription for this to her M.D. company, which is [facility].  We did discuss the importance of weight loss with her morbid obesity, and sodium restriction.  Also, history of gout or uric at 4 months ago, was 9.5, and her sedimentation rate was 34.  I will see her in 6 months for reevaluation or sooner if problems arise     Troy Sine, MD, [facility]  10/16/2013 2:21 PM

## 2013-10-29 ENCOUNTER — Telehealth: Payer: Self-pay | Admitting: *Deleted

## 2013-10-29 NOTE — Telephone Encounter (Signed)
Returned CPAP supply order to [facility].

## 2013-11-23 ENCOUNTER — Other Ambulatory Visit: Payer: Self-pay | Admitting: *Deleted

## 2013-11-23 ENCOUNTER — Telehealth: Payer: Self-pay | Admitting: Pharmacist Clinician (PhC)/ Clinical Pharmacy Specialist

## 2013-11-23 MED ORDER — WARFARIN SODIUM 5 MG PO TABS: ORAL_TABLET | ORAL | Status: DC

## 2013-11-23 MED ORDER — CARVEDILOL PHOSPHATE ER 80 MG PO CP24: 80.0000 mg | ORAL_CAPSULE | Freq: Every day | ORAL | Status: DC

## 2013-11-23 NOTE — Telephone Encounter (Signed)
Pt called, LMOM, wants refill of warfarin, having trouble getting from pharmacy.    Returned call, cell # not taking messages, home number answered by female.  Advised that we call back in am.    Will call tomorrow, refill warfarin x 30 days, pt due for INR.

## 2013-11-24 NOTE — Telephone Encounter (Signed)
LMOM for pt, past due for INR, informed her will not refill rx without INR check.

## 2013-11-25 ENCOUNTER — Telehealth: Payer: Self-pay | Admitting: *Deleted

## 2013-11-25 NOTE — Telephone Encounter (Signed)
Returned CPAP supply order to [facility]. 

## 2013-12-04 ENCOUNTER — Telehealth: Payer: Self-pay | Admitting: *Deleted

## 2013-12-04 NOTE — Telephone Encounter (Signed)
Returned CPAP supply order to [facility].

## 2013-12-18 ENCOUNTER — Telehealth: Payer: Self-pay | Admitting: Pharmacist Clinician (PhC)/ Clinical Pharmacy Specialist

## 2013-12-18 NOTE — Telephone Encounter (Signed)
Pt LMOM, returing previous call to schedule INR.  Stated that she was seen by Dr. Claiborne Billings in July, had INR done then, she should be good until next year.    Returned call, LMOM, explaining that INR checks should be monthly.  Asked her to call back and schedule one ASAP.

## 2013-12-25 ENCOUNTER — Other Ambulatory Visit: Payer: Self-pay | Admitting: Pharmacist Clinician (PhC)/ Clinical Pharmacy Specialist

## 2013-12-25 ENCOUNTER — Ambulatory Visit (INDEPENDENT_AMBULATORY_CARE_PROVIDER_SITE_OTHER): Payer: Private Health Insurance - Indemnity | Admitting: Pharmacist Clinician (PhC)/ Clinical Pharmacy Specialist

## 2013-12-25 DIAGNOSIS — I480 Paroxysmal atrial fibrillation: Secondary | ICD-10-CM

## 2013-12-25 DIAGNOSIS — I4891 Unspecified atrial fibrillation: Secondary | ICD-10-CM

## 2013-12-25 DIAGNOSIS — Z7901 Long term (current) use of anticoagulants: Secondary | ICD-10-CM

## 2013-12-25 LAB — POCT INR: INR: 5.9

## 2013-12-25 MED ORDER — WARFARIN SODIUM 5 MG PO TABS: ORAL_TABLET | ORAL | Status: DC

## 2014-01-04 ENCOUNTER — Ambulatory Visit (INDEPENDENT_AMBULATORY_CARE_PROVIDER_SITE_OTHER): Payer: Private Health Insurance - Indemnity | Admitting: Pharmacist Clinician (PhC)/ Clinical Pharmacy Specialist

## 2014-01-04 DIAGNOSIS — Z7901 Long term (current) use of anticoagulants: Secondary | ICD-10-CM

## 2014-01-04 DIAGNOSIS — I4891 Unspecified atrial fibrillation: Secondary | ICD-10-CM

## 2014-01-04 DIAGNOSIS — I480 Paroxysmal atrial fibrillation: Secondary | ICD-10-CM

## 2014-01-04 LAB — POCT INR: INR: 3.3

## 2014-01-12 ENCOUNTER — Other Ambulatory Visit: Payer: Self-pay | Admitting: Cardiovascular Disease

## 2014-01-12 NOTE — Telephone Encounter (Signed)
Rx was sent to pharmacy electronically. 

## 2014-01-15 ENCOUNTER — Other Ambulatory Visit: Payer: Self-pay | Admitting: Pharmacist Clinician (PhC)/ Clinical Pharmacy Specialist

## 2014-01-15 MED ORDER — WARFARIN SODIUM 5 MG PO TABS: ORAL_TABLET | ORAL | Status: DC

## 2014-01-25 ENCOUNTER — Ambulatory Visit (INDEPENDENT_AMBULATORY_CARE_PROVIDER_SITE_OTHER): Payer: Private Health Insurance - Indemnity | Admitting: Pharmacist Clinician (PhC)/ Clinical Pharmacy Specialist

## 2014-01-25 DIAGNOSIS — Z7901 Long term (current) use of anticoagulants: Secondary | ICD-10-CM

## 2014-01-25 DIAGNOSIS — I480 Paroxysmal atrial fibrillation: Secondary | ICD-10-CM

## 2014-01-25 LAB — POCT INR: INR: 2.9

## 2014-02-18 ENCOUNTER — Emergency Department (HOSPITAL_COMMUNITY)
Admission: EM | Admit: 2014-02-18 | Discharge: 2014-02-18 | Disposition: A | Discharge status: Home / Self Care | Payer: Private Health Insurance - Indemnity | Attending: Emergency Medicine | Admitting: Emergency Medicine

## 2014-02-18 ENCOUNTER — Emergency Department (HOSPITAL_COMMUNITY): Payer: Private Health Insurance - Indemnity

## 2014-02-18 ENCOUNTER — Encounter (HOSPITAL_COMMUNITY): Payer: Self-pay | Admitting: Emergency Medicine

## 2014-02-18 DIAGNOSIS — Z791 Long term (current) use of non-steroidal anti-inflammatories (NSAID): Secondary | ICD-10-CM | POA: Diagnosis not present

## 2014-02-18 DIAGNOSIS — M549 Dorsalgia, unspecified: Secondary | ICD-10-CM | POA: Insufficient documentation

## 2014-02-18 DIAGNOSIS — R1031 Right lower quadrant pain: Secondary | ICD-10-CM | POA: Diagnosis not present

## 2014-02-18 DIAGNOSIS — R109 Unspecified abdominal pain: Secondary | ICD-10-CM

## 2014-02-18 DIAGNOSIS — Z79899 Other long term (current) drug therapy: Secondary | ICD-10-CM | POA: Insufficient documentation

## 2014-02-18 DIAGNOSIS — I10 Essential (primary) hypertension: Secondary | ICD-10-CM | POA: Insufficient documentation

## 2014-02-18 DIAGNOSIS — Z9889 Other specified postprocedural states: Secondary | ICD-10-CM | POA: Diagnosis not present

## 2014-02-18 DIAGNOSIS — D3001 Benign neoplasm of right kidney: Secondary | ICD-10-CM | POA: Diagnosis not present

## 2014-02-18 DIAGNOSIS — Z9981 Dependence on supplemental oxygen: Secondary | ICD-10-CM | POA: Insufficient documentation

## 2014-02-18 DIAGNOSIS — R110 Nausea: Secondary | ICD-10-CM | POA: Insufficient documentation

## 2014-02-18 DIAGNOSIS — G4730 Sleep apnea, unspecified: Secondary | ICD-10-CM | POA: Insufficient documentation

## 2014-02-18 DIAGNOSIS — Z7901 Long term (current) use of anticoagulants: Secondary | ICD-10-CM | POA: Insufficient documentation

## 2014-02-18 DIAGNOSIS — R197 Diarrhea, unspecified: Secondary | ICD-10-CM | POA: Insufficient documentation

## 2014-02-18 DIAGNOSIS — I4891 Unspecified atrial fibrillation: Secondary | ICD-10-CM | POA: Diagnosis not present

## 2014-02-18 DIAGNOSIS — Z8739 Personal history of other diseases of the musculoskeletal system and connective tissue: Secondary | ICD-10-CM | POA: Diagnosis not present

## 2014-02-18 LAB — PROTIME-INR
INR: 1.88 — ABNORMAL HIGH (ref 0.00–1.49)
PROTHROMBIN TIME: 21.7 s — AB (ref 11.6–15.2)

## 2014-02-18 MED ORDER — IOHEXOL 300 MG/ML  SOLN
100.0000 mL | Freq: Once | INTRAMUSCULAR | Status: AC | PRN
  Administered 2014-02-18: 100 mL via INTRAVENOUS

## 2014-02-18 MED ORDER — OXYCODONE-ACETAMINOPHEN 5-325 MG PO TABS
1.0000 | ORAL_TABLET | Freq: Once | ORAL | Status: AC
  Administered 2014-02-18: 1 via ORAL
  Filled 2014-02-18: qty 1

## 2014-02-18 MED ORDER — HYDROMORPHONE HCL 1 MG/ML IJ SOLN
1.0000 mg | Freq: Once | INTRAMUSCULAR | Status: AC
  Administered 2014-02-18: 1 mg via INTRAVENOUS
  Filled 2014-02-18: qty 1

## 2014-02-18 MED ORDER — ONDANSETRON HCL 4 MG/2ML IJ SOLN
4.0000 mg | Freq: Once | INTRAMUSCULAR | Status: AC
  Administered 2014-02-18: 4 mg via INTRAVENOUS
  Filled 2014-02-18: qty 2

## 2014-02-18 MED ORDER — SODIUM CHLORIDE 0.9 % IV SOLN
INTRAVENOUS | Status: DC
  Administered 2014-02-18: 22:00:00 via INTRAVENOUS

## 2014-02-18 MED ORDER — IOHEXOL 300 MG/ML  SOLN
25.0000 mL | INTRAMUSCULAR | Status: DC | PRN
  Administered 2014-02-18: 25 mL via ORAL

## 2014-02-18 MED ORDER — SODIUM CHLORIDE 0.9 % IV BOLUS (SEPSIS)
250.0000 mL | Freq: Once | INTRAVENOUS | Status: AC
  Administered 2014-02-18: 250 mL via INTRAVENOUS

## 2014-02-18 NOTE — ED Notes (Signed)
Pt had labs drawn at her PCP prior, results given to EDP

## 2014-02-18 NOTE — ED Provider Notes (Signed)
CSN: [id number]     Arrival date & time 02/18/14  1704 History   First MD Initiated Contact with Patient 02/18/14 2056     Chief Complaint  Patient presents with  . Flank Pain     (Consider location/radiation/quality/duration/timing/severity/associated sxs/prior Treatment) Patient is a 58 y.o. female presenting with flank pain. The history is provided by the patient.  Flank Pain Associated symptoms include abdominal pain. Pertinent negatives include no chest pain, no headaches and no shortness of breath.   Patient referred in from Dr. Julianne Rice office. Patient with three-week history of right flank pain starts in the right CVA area radiates to right flank and then to the right lower quadrant. His been getting progressively worse. Patient seen at primary care office this morning had extensive lab work completed no significant abnormalities on that. Patient came with the lab results. Patient referred here for further evaluation. Patients on Coumadin for atrial fibrillation. Patient has a history of lupus and diabetes.  Patient states that the pain is 10 out of 10. Patient is currently taking Percocet to control pain.   Past Medical History  Diagnosis Date  . A-fib   . Cardiomyopathy   . Sleep apnea 2008    CPAP  . Thyroid disease   . Renal disorder   . Lupus   . Hypertension   . Pulmonary hypertension 05/10/2011    Echo, EF-40-45  . Chest pain 09/21/2011    Nuc,    Past Surgical History  Procedure Laterality Date  . Cardiac catheterization      Nonischemic, EF-40, continued medical therapy  . Cardiac catheterization  10/02/2006    No significant CAD   Family History  Problem Relation Age of Onset  . Heart disease Mother   . Heart disease Father    History  Substance Use Topics  . Smoking status: Never Smoker   . Smokeless tobacco: Not on file  . Alcohol Use: Yes   OB History   Grav Para Term Preterm Abortions TAB SAB Ect Mult Living                 Review of  Systems  Constitutional: Negative for fever.  HENT: Negative for congestion.   Eyes: Negative for visual disturbance.  Respiratory: Negative for shortness of breath.   Cardiovascular: Negative for chest pain.  Gastrointestinal: Positive for nausea, abdominal pain and diarrhea. Negative for vomiting.  Genitourinary: Positive for flank pain. Negative for dysuria and hematuria.  Musculoskeletal: Positive for back pain.  Skin: Negative for rash.  Neurological: Negative for headaches.  Hematological: Bruises/bleeds easily.  Psychiatric/Behavioral: Negative for confusion.      Allergies  Diovan and Lisinopril  Home Medications   Prior to Admission medications   Medication Sig Start Date End Date Taking? Authorizing Provider  allopurinol (ZYLOPRIM) 100 MG tablet Take 200 mg by mouth daily.  07/09/13  Yes Historical Provider, MD  atorvastatin (LIPITOR) 20 MG tablet Take 20 mg by mouth daily.   Yes Historical Provider, MD  carvedilol (COREG CR) 80 MG 24 hr capsule Take 80 mg by mouth daily.   Yes Historical Provider, MD  celecoxib (CELEBREX) 200 MG capsule Take 200 mg by mouth 2 (two) times daily.   Yes Historical Provider, MD  colchicine 0.6 MG tablet Take 0.6 mg by mouth daily.   Yes Historical Provider, MD  diltiazem (CARDIZEM CD) 120 MG 24 hr capsule Take 120 mg by mouth at bedtime.   Yes Historical Provider, MD  ferrous sulfate 325 (65  FE) MG tablet Take 325 mg by mouth daily with breakfast.   Yes Historical Provider, MD  Multiple Vitamin (MULITIVITAMIN WITH MINERALS) TABS Take 1 tablet by mouth daily.   Yes Historical Provider, MD  paricalcitol (ZEMPLAR) 1 MCG capsule Take 1 mcg by mouth daily.   Yes Historical Provider, MD  torsemide (DEMADEX) 20 MG tablet Take 20 mg by mouth 2 (two) times daily. 2 tablets in the morning 1 tablet at night   Yes Historical Provider, MD  warfarin (COUMADIN) 7.5 MG tablet Take 5-7.5 mg by mouth daily. Take 5mg  on Mon & Fri ONLY Take 7.5mg  every other  day including Sun   Yes Historical Provider, MD  zolpidem (AMBIEN) 10 MG tablet Take 10 mg by mouth at bedtime as needed for sleep.    Yes Historical Provider, MD   BP 128/54  Pulse 72  Temp(Src) 97.9 F (36.6 C) (Oral)  Resp 13  Ht 5\' 2"  (1.575 m)  Wt 262 lb (118.842 kg)  BMI 47.91 kg/m2  SpO2 95% Physical Exam  Nursing note and vitals reviewed. Constitutional: She is oriented to person, place, and time. She appears well-developed and well-nourished. No distress.  HENT:  Head: Normocephalic and atraumatic.  Mouth/Throat: Oropharynx is clear and moist.  Eyes: Conjunctivae and EOM are normal. Pupils are equal, round, and reactive to light.  Neck: Normal range of motion. Neck supple.  Cardiovascular: Normal rate, regular rhythm and normal heart sounds.   No murmur heard. Pulmonary/Chest: Effort normal and breath sounds normal. No respiratory distress.  Abdominal: Soft. Bowel sounds are normal. There is no tenderness.  Musculoskeletal: Normal range of motion.  Neurological: She is alert and oriented to person, place, and time. No cranial nerve deficit. She exhibits normal muscle tone. Coordination normal.  Skin: Skin is warm. No erythema.    ED Course  Procedures (including critical care time) Labs Review Labs Reviewed  PROTIME-INR - Abnormal; Notable for the following:    Prothrombin Time 21.7 (*)    INR 1.88 (*)    All other components within normal limits    Imaging Review Ct Abdomen Pelvis W Contrast  02/18/2014   CLINICAL DATA:  Right flank pain  EXAM: CT ABDOMEN AND PELVIS WITH CONTRAST  TECHNIQUE: Multidetector CT imaging of the abdomen and pelvis was performed using the standard protocol following bolus administration of intravenous contrast.  CONTRAST:  133mL OMNIPAQUE IOHEXOL 300 MG/ML  SOLN  COMPARISON:  08/31/2013  FINDINGS: The lung bases are clear.  Cardiac enlargement.  Hepatic steatosis. Too small to further characterize hypodensity within the right hepatic lobe  on image 16.  No biliary ductal dilatation of or radiodense gallstones.  No appreciable abnormality of the spleen, pancreas, adrenal glands.  2.2 cm interpolar right renal angiomyolipoma. A tiny nonobstructing renal stone is suspected on the right. No hydroureteronephrosis.  No overt colitis. Mild colonic diverticulosis. The appendix is decompressed. No right lower quadrant inflammation. Small bowel loops for of normal course and caliber. No free intraperitoneal air or fluid. Small fat containing umbilical hernia. No lymphadenopathy.  Normal caliber aorta and branch vessels with scattered atherosclerotic disease.  Mild bladder wall thickening is nonspecific given incomplete distention. Uterus is not seen. No adnexal mass.  Multilevel degenerative changes.  No acute osseous finding.  IMPRESSION: There may be a tiny nonobstructing right renal stone. No hydroureteronephrosis.  Appendix within normal limits.  Mild bladder wall thickening is nonspecific given incomplete distention. Correlate with urinalysis to exclude cystitis.  Colonic diverticulosis. No CT findings  of acute colitis or diverticulitis.  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   Electronically Signed   By: Carlos Levering M.D.   On: 02/18/2014 23:26     EKG Interpretation None      MDM   Final diagnoses:  Right flank pain  Angiomyolipoma of kidney, right    Patient's workup without specific cause for the right flank pain incidental finding of an angiomyolipoma it is in the right kidney doubt that this is causing the pain but I guess it's a possibility. She had extensive labs done at her primary care office earlier today which included a urinalysis which is negative for urinary tract infection. CBC electrolytes and liver function test were all negative. INR was done here because that was not done earlier and some little subtherapeutic at 1.88. Follow-up with Dr. Aggie Moats primary care doctor would be important. Patient does have Percocet to take  for the pain. No evidence of obstructing kidney stone or ureteral stone no other significant intra-abdominal findings.    Fredia Sorrow, MD 02/18/14 2352

## 2014-02-18 NOTE — Discharge Instructions (Signed)
Continue Percocet as needed for pain. CT scan without explanation for the pain. There was an incidental finding of a benign fatty-like tumor in the right kidney. Doubt that this is causing the pain. Follow-up with Dr. Davis Gourd would be appropriate. Labs were completely normal as we discussed from your office visit earlier today. Your INR was a little subtherapeutic at 1.88. Follow-up with Dr. Maudie Mercury about that as well.

## 2014-02-18 NOTE — ED Notes (Signed)
CT staff at bedside to administer oral contrast.

## 2014-02-18 NOTE — ED Notes (Signed)
Pt in c/o right flank pain that started about three weeks ago and is getting progressively worse, seen at PCP office and had lab work completed, sent here for further evaluation

## 2014-02-22 ENCOUNTER — Ambulatory Visit: Payer: Private Health Insurance - Indemnity | Admitting: Pharmacist Clinician (PhC)/ Clinical Pharmacy Specialist

## 2014-02-25 ENCOUNTER — Other Ambulatory Visit: Payer: Self-pay

## 2014-02-25 MED ORDER — POTASSIUM CHLORIDE 20 MEQ/15ML (10%) PO SOLN: 20.0000 meq | Freq: Every day | ORAL | Status: DC

## 2014-02-25 NOTE — Telephone Encounter (Signed)
Rx sent to pharmacy   

## 2014-02-26 ENCOUNTER — Ambulatory Visit (INDEPENDENT_AMBULATORY_CARE_PROVIDER_SITE_OTHER): Payer: Private Health Insurance - Indemnity | Admitting: Pharmacist Clinician (PhC)/ Clinical Pharmacy Specialist

## 2014-02-26 DIAGNOSIS — Z7901 Long term (current) use of anticoagulants: Secondary | ICD-10-CM

## 2014-02-26 DIAGNOSIS — I480 Paroxysmal atrial fibrillation: Secondary | ICD-10-CM

## 2014-02-26 LAB — POCT INR: INR: 2.4

## 2014-03-22 ENCOUNTER — Other Ambulatory Visit: Payer: Self-pay | Admitting: Obstetrics & Gynecology

## 2014-03-23 LAB — CYTOLOGY - PAP

## 2014-03-25 ENCOUNTER — Ambulatory Visit (INDEPENDENT_AMBULATORY_CARE_PROVIDER_SITE_OTHER): Payer: Private Health Insurance - Indemnity

## 2014-03-25 ENCOUNTER — Ambulatory Visit (INDEPENDENT_AMBULATORY_CARE_PROVIDER_SITE_OTHER): Payer: Private Health Insurance - Indemnity | Admitting: Cardiovascular Disease

## 2014-03-25 ENCOUNTER — Ambulatory Visit (INDEPENDENT_AMBULATORY_CARE_PROVIDER_SITE_OTHER): Payer: Private Health Insurance - Indemnity | Admitting: Pharmacist Clinician (PhC)/ Clinical Pharmacy Specialist

## 2014-03-25 ENCOUNTER — Encounter: Payer: Self-pay | Admitting: Cardiovascular Disease

## 2014-03-25 ENCOUNTER — Telehealth: Payer: Self-pay | Admitting: *Deleted

## 2014-03-25 VITALS — BP 134/90 | HR 67 | Ht 64.0 in | Wt 264.8 lb

## 2014-03-25 DIAGNOSIS — I480 Paroxysmal atrial fibrillation: Secondary | ICD-10-CM

## 2014-03-25 DIAGNOSIS — Z79899 Other long term (current) drug therapy: Secondary | ICD-10-CM

## 2014-03-25 DIAGNOSIS — E785 Hyperlipidemia, unspecified: Secondary | ICD-10-CM

## 2014-03-25 DIAGNOSIS — Z7901 Long term (current) use of anticoagulants: Secondary | ICD-10-CM

## 2014-03-25 DIAGNOSIS — G4733 Obstructive sleep apnea (adult) (pediatric): Secondary | ICD-10-CM

## 2014-03-25 DIAGNOSIS — E782 Mixed hyperlipidemia: Secondary | ICD-10-CM

## 2014-03-25 DIAGNOSIS — Z9989 Dependence on other enabling machines and devices: Secondary | ICD-10-CM

## 2014-03-25 DIAGNOSIS — R600 Localized edema: Secondary | ICD-10-CM

## 2014-03-25 LAB — POCT INR: INR: 4.2

## 2014-03-25 MED ORDER — HYDRALAZINE HCL 25 MG PO TABS: 25.0000 mg | ORAL_TABLET | Freq: Two times a day (BID) | ORAL | Status: DC

## 2014-03-25 NOTE — Patient Instructions (Signed)
Your physician has recommended you make the following change in your medication:take the torsemide to 40 mg in the morning and 20 mg in the early evening ONLY IF NEEDED. Start new prescription for hydralazine 25 mg. This has already been sent to your pharmacy. STOP THE DILTIAZEM!  Your physician recommends that you return for lab work fasting.  Your physician recommends that you schedule a follow-up appointment in: 2-3 months.

## 2014-03-25 NOTE — Progress Notes (Signed)
Patient ID: Caitlyn Fuentes, female   DOB: 09-19-1955, 58 y.o.   MRN: [id number]     HPI: Caitlyn Fuentes is a 58 y.o. female who presents to the office today for an 23 month follow up cardiology evaluation.  Ms Johansen  initially presented with atrial fibrillation in 2008 was found to have a nonischemic cardiomyopathy.  Cardiac catheterization did not show significant coronary obstructive disease and was found to have mild pulmonary hypertension.   She's been documented as having obstructive sleep apnea since 2008 and has been using CPAP.  She had undergone a follow-up sleep study in 2013, but never follow-up with getting a new CPAP machine.  She uses [employer] for her [employer] and has not had a download in some time.  At times she does not feel that she is getting adequate pressure.    Additional problems include with obesity, hypertension, mild renal insufficiency, as well as lower extremity edema.  There also is a history of lupus as well as gout.  She has been felt to have venous reflux disease with deep venous insufficiency within the left common femoral vein and right and left greater saphenous veins have demonstrated valvular insufficiency, as well as the left short saphenous vein.    Over the past year, she denies any episodes of chest pain.  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  She tells me she did see her urologist this week.  She has a history of obesity.  She notes shortness of breath with talking and walking.  She also admits to lower extremity edema.  She has been taking torsemide 40 mg at bedtime rather than in the morning and oftentimes wakes up several times per night for urination.  She also has been taking diltiazem 120 mg as well as long-acting carvedilol 80 mg daily.  She will is maintained on Coumadin anticoagulation.  She denies recurrent episodes of atrial  fibrillation.  She continues to be on atorvastatin 20 mg for hyperlipidemia, as well as allupurinol for gout.  She presents for follow-up evaluation.  Past Medical History  Diagnosis Date  . A-fib   . Cardiomyopathy   . Sleep apnea 2008    CPAP  . Thyroid disease   . Renal disorder   . Lupus   . Hypertension   . Pulmonary hypertension 05/10/2011    Echo, EF-40-45  . Chest pain 09/21/2011    Nuc,     Past Surgical History  Procedure Laterality Date  . Cardiac catheterization      Nonischemic, EF-40, continued medical therapy  . Cardiac catheterization  10/02/2006    No significant CAD    Allergies  Allergen Reactions  . Diovan [Valsartan] Other (See Comments)    Messes up kidneys  . Lisinopril Swelling    Facial swelling    Current Outpatient Prescriptions  Medication Sig Dispense Refill  . allopurinol (ZYLOPRIM) 100 MG tablet Take 200 mg by mouth daily.     Marland Kitchen atorvastatin (LIPITOR) 20 MG tablet Take 20 mg by mouth daily.    . carvedilol (COREG CR) 80 MG 24 hr capsule Take 80 mg by mouth daily.    . celecoxib (CELEBREX) 200 MG capsule Take 200 mg by mouth 2 (two) times daily.    . colchicine 0.6 MG tablet Take 0.6 mg by mouth daily.    Marland Kitchen diltiazem (CARDIZEM CD) 120 MG 24 hr capsule Take 120  mg by mouth at bedtime.    . ferrous sulfate 325 (65 FE) MG tablet Take 325 mg by mouth daily with breakfast.    . Multiple Vitamin (MULITIVITAMIN WITH MINERALS) TABS Take 1 tablet by mouth daily.    . paricalcitol (ZEMPLAR) 1 MCG capsule Take 1 mcg by mouth daily.    . potassium chloride 20 MEQ/15ML (10%) SOLN Take 15 mLs (20 mEq total) by mouth daily. 900 mL 3  . torsemide (DEMADEX) 20 MG tablet Take 20 mg by mouth 2 (two) times daily. 2 tablets in the morning 1 tablet at night    . warfarin (COUMADIN) 7.5 MG tablet Take 5-7.5 mg by mouth daily. Take 5mg  on Mon & Fri ONLY Take 7.5mg  every other day including Sun    . zolpidem (AMBIEN) 10 MG tablet Take 10 mg by mouth at bedtime  as needed for sleep.      No current facility-administered medications for this visit.    History   Social History  . Marital Status: Married    Spouse Name: N/A    Number of Children: N/A  . Years of Education: N/A   Occupational History  . Not on file.   Social History Main Topics  . Smoking status: Never Smoker   . Smokeless tobacco: Not on file  . Alcohol Use: Yes  . Drug Use: No  . Sexual Activity: Not on file   Other Topics Concern  . Not on file   Social History Narrative   Socially, she is married and has 4 children, one grandchild.  There is no tobacco or alcohol use.  She does not routinely exercise.  Family History  Problem Relation Age of Onset  . Heart disease Mother   . Heart disease Father     ROS General: Negative; No fevers, chills, or night sweats HEENT: Negative; No changes in vision or hearing, sinus congestion, difficulty swallowing Pulmonary: Negative; No cough, wheezing, shortness of breath, hemoptysis Cardiovascular: See HPI: No chest pain, presyncope, syncope, palpatations, intermitent leg swelling with venous insufficiency GI: Negative; No nausea, vomiting, diarrhea, or abdominal pain GU: Negative; No dysuria, hematuria, or difficulty voiding Musculoskeletal: Negative; no myalgias, joint pain, or weakness Hematologic: Negative; no easy bruising, bleeding Endocrine: Negative; no heat/cold intolerance; no diabetes, Rheumatologic: Positive for history of gout Neuro: Negative; no changes in balance, headaches Skin:  Malar rash Psychiatric: Negative; No behavioral problems, depression Sleep:  Positive for sleep apnea;  No snoring, daytime sleepiness, hypersomnolence, bruxism, restless legs, hypnogognic hallucinations. Other comprehensive 14 point system review is negative   Physical Exam BP 134/90 mmHg  Pulse 67  Ht 5\' 4"  (1.626 m)  Wt 264 lb 12.8 oz (120.112 kg)  BMI 45.43 kg/m2 General: Alert, oriented, no distress.  Morbid  obesity Skin: normal turgor, no rashes, warm and dry HEENT: Normocephalic, atraumatic. Pupils equal round and reactive to light; sclera anicteric; extraocular muscles intact, No lid lag; Nose without nasal septal hypertrophy; Mouth/Parynx benign; Mallinpatti scale 3/4 Neck: Thick neck No JVD, no carotid bruits; normal carotid upstroke Lungs: clear to ausculatation and percussion bilaterally; no wheezing or rales, normal inspiratory and expiratory effort Chest wall: without tenderness to palpitation Heart: PMI not displaced, RRR, s1 s2 normal, 2/6systolic murmur, No diastolic murmur, no rubs, gallops, thrills, or heaves Abdomen: Moderately severe central adiposity soft, nontender; no hepatosplenomehaly, BS+; abdominal aorta nontender and not dilated by palpation. Back: no CVA tenderness Pulses: 2+  Musculoskeletal: full range of motion, normal strength, no joint deformities Extremities: Pulses  2+, venous stasis changes; with 1-2+ lower extremity edema bilaterally;no clubbing cyanosis; Homan's sign negative  Neurologic: grossly nonfocal; Cranial nerves grossly wnl Psychologic: Normal mood and affect   ECG (independently read by me): Normal sinus rhythm at 78 beats per minute.  LVH by voltage criteria in aVL.  No significant ST changes. QTc interval 437 ms.  LABS:  BMET    Component Value Date/Time   NA 143 08/16/2011 1321   K 3.7 08/16/2011 1321   CL 102 08/16/2011 1321   CO2 30 08/16/2011 1321   GLUCOSE 98 08/16/2011 1321   BUN 19 08/16/2011 1321   CREATININE 1.29* 08/16/2011 1321   CALCIUM 9.8 08/16/2011 1321   GFRNONAA 46* 08/16/2011 1321   GFRAA 53* 08/16/2011 1321     Hepatic Function Panel     Component Value Date/Time   PROT 6.2 09/13/2009 0355   ALBUMIN 3.3* 09/13/2009 0355   AST 16 09/13/2009 0355   ALT 17 09/13/2009 0355   ALKPHOS 46 09/13/2009 0355   BILITOT 0.7 09/13/2009 0355     CBC    Component Value Date/Time   WBC 4.2 05/28/2013 1617   RBC 4.09  05/28/2013 1617   HGB 12.3 05/28/2013 1617   HCT 36.7 05/28/2013 1617   PLT 425* 05/28/2013 1617   MCV 89.7 05/28/2013 1617   MCH 30.1 05/28/2013 1617   MCHC 33.5 05/28/2013 1617   RDW 13.9 05/28/2013 1617   LYMPHSABS 1.5 05/28/2013 1617   MONOABS 0.4 05/28/2013 1617   EOSABS 0.1 05/28/2013 1617   BASOSABS 0.0 05/28/2013 1617     BNP    Component Value Date/Time   PROBNP 289.4* 08/16/2011 1320    Lipid Panel     Component Value Date/Time   CHOL  09/13/2009 0355    112        ATP III CLASSIFICATION:  <200     mg/dL   Desirable  200-239  mg/dL   Borderline High  >=240    mg/dL   High          TRIG 86 09/13/2009 0355   HDL 39* 09/13/2009 0355   CHOLHDL 2.9 09/13/2009 0355   VLDL 17 09/13/2009 0355   LDLCALC  09/13/2009 0355    56        Total Cholesterol/HDL:CHD Risk Coronary Heart Disease Risk Table                     Men   Women  1/2 Average Risk   3.4   3.3  Average Risk       5.0   4.4  2 X Average Risk   9.6   7.1  3 X Average Risk  23.4   11.0        Use the calculated Patient Ratio above and the CHD Risk Table to determine the patient's CHD Risk.        ATP III CLASSIFICATION (LDL):  <100     mg/dL   Optimal  100-129  mg/dL   Near or Above                    Optimal  130-159  mg/dL   Borderline  160-189  mg/dL   High  >190     mg/dL   Very High     RADIOLOGY: No results found.    ASSESSMENT AND PLAN: Ms. Suleman has a history of previous non-ischemic cardiomyopathy with an ejection fraction of approximately 35% initially.  Subsequent echo Doppler study in 2013 showed her ejection fraction at 40-45%.  She has been maintaining normal sinus rhythm and has not had recurrent atrial fibrillation.  She does have venous insufficiency and has bilateral lower extremity edema 1-2+ today.  She's not been using support stockings.  She has a history of hypertension.  She has been taking torsemide 40 mg at bedtime and I've suggested that she change this and  take 40 ohms and a morning and that she can take an extra 20 mg in the late afternoon if she notes recurrent leg edema.  Her blood pressure today was 134/90.  In the past.  She's had in tolerability with facial swelling secondary to lisinopril as well as Diovan.  I am adding hydralazine 25 mg twice a day, Femara optimal blood pressure control.  She is morbidly obese with a body mass index of 45.43, and I have suggested weight loss, strict sodium restriction as well as exercise.  I  again recommended compression stockings.  A complete set of blood work will be obtained in the fasting state.  I have recommended she discontinue Celebrex since this may be contributing to her lower extremity edema.  Adjustments will be made if necessary to her medical regimen following completion of the laboratory.  I will see her in 2 months for follow-up cardiology evaluation.   Troy Sine, MD, [facility]  03/25/2014 11:44 AM

## 2014-03-25 NOTE — Telephone Encounter (Signed)
Spoke with patient and informed her of Kristin's response to her INR that was checked during her OV today with Dr.Kelly. Patient was told to hold warfarin x 2 days then continue with 1 tablet daily except 1.5 tablets each Sunday, Tuesday and Thursday. Repeat INR in 2 weeks. Dose was verified with patient and repeated back. Follow up appointment was made for Thursday Dec 17 at 10am with Erasmo Downer for a recheck INR

## 2014-03-26 LAB — COMPREHENSIVE METABOLIC PANEL
ALT: 32 U/L (ref 0–35)
AST: 26 U/L (ref 0–37)
Albumin: 4 g/dL (ref 3.5–5.2)
Alkaline Phosphatase: 96 U/L (ref 39–117)
BILIRUBIN TOTAL: 0.6 mg/dL (ref 0.2–1.2)
BUN: 20 mg/dL (ref 6–23)
CO2: 31 meq/L (ref 19–32)
CREATININE: 1.2 mg/dL — AB (ref 0.50–1.10)
Calcium: 9 mg/dL (ref 8.4–10.5)
Chloride: 101 mEq/L (ref 96–112)
GLUCOSE: 103 mg/dL — AB (ref 70–99)
Potassium: 3.4 mEq/L — ABNORMAL LOW (ref 3.5–5.3)
Sodium: 142 mEq/L (ref 135–145)
Total Protein: 7.2 g/dL (ref 6.0–8.3)

## 2014-03-26 LAB — CBC
HEMATOCRIT: 35.8 % — AB (ref 36.0–46.0)
Hemoglobin: 11.8 g/dL — ABNORMAL LOW (ref 12.0–15.0)
MCH: 29.5 pg (ref 26.0–34.0)
MCHC: 33 g/dL (ref 30.0–36.0)
MCV: 89.5 fL (ref 78.0–100.0)
MPV: 10.1 fL (ref 9.4–12.4)
Platelets: 385 10*3/uL (ref 150–400)
RBC: 4 MIL/uL (ref 3.87–5.11)
RDW: 13.7 % (ref 11.5–15.5)
WBC: 4.1 10*3/uL (ref 4.0–10.5)

## 2014-03-26 LAB — LIPID PANEL
CHOL/HDL RATIO: 5.3 ratio
CHOLESTEROL: 207 mg/dL — AB (ref 0–200)
HDL: 39 mg/dL — AB (ref >39)
LDL CALC: 117 mg/dL — AB (ref 0–99)
TRIGLYCERIDES: 256 mg/dL — AB (ref <150)
VLDL: 51 mg/dL — ABNORMAL HIGH (ref 0–40)

## 2014-03-26 LAB — TSH: TSH: 2.808 u[IU]/mL (ref 0.350–4.500)

## 2014-03-30 ENCOUNTER — Telehealth: Payer: Self-pay | Admitting: *Deleted

## 2014-03-30 DIAGNOSIS — E876 Hypokalemia: Secondary | ICD-10-CM

## 2014-03-30 NOTE — Telephone Encounter (Signed)
Spoke with pt, she reports she has been out of potassium for some time now but she just got it refilled. Explained to the patient the importance of taking medication properly. Patient voiced understanding  Lab orders placed

## 2014-03-30 NOTE — Telephone Encounter (Signed)
-----   Message from Leonie Man, MD sent at 03/29/2014  6:23 PM EST ----- Would increase Potassium dose to BID every other day x 1 week & recheck BMP in 2 weeks. Otherwise CBC was good. Triglycerides are relatively high in the HDL is much higher than 3 years ago. I will defer this to Dr. Claiborne Billings.  Leonie Man, MD

## 2014-03-30 NOTE — Telephone Encounter (Signed)
Returning call about test her results .Marland Kitchen Please Call  Thanks

## 2014-04-02 ENCOUNTER — Other Ambulatory Visit: Payer: Self-pay | Admitting: Cardiovascular Disease

## 2014-04-05 NOTE — Telephone Encounter (Signed)
Rx refill sent to patient pharmacy   

## 2014-04-08 ENCOUNTER — Encounter: Payer: Private Health Insurance - Indemnity | Attending: Nephrology | Admitting: Dietician

## 2014-04-08 ENCOUNTER — Ambulatory Visit (INDEPENDENT_AMBULATORY_CARE_PROVIDER_SITE_OTHER): Payer: Private Health Insurance - Indemnity | Admitting: *Deleted

## 2014-04-08 ENCOUNTER — Ambulatory Visit: Payer: Private Health Insurance - Indemnity | Admitting: Pharmacist Clinician (PhC)/ Clinical Pharmacy Specialist

## 2014-04-08 ENCOUNTER — Encounter: Payer: Self-pay | Admitting: Dietician

## 2014-04-08 DIAGNOSIS — Z6841 Body Mass Index (BMI) 40.0 and over, adult: Secondary | ICD-10-CM | POA: Insufficient documentation

## 2014-04-08 DIAGNOSIS — Z7901 Long term (current) use of anticoagulants: Secondary | ICD-10-CM

## 2014-04-08 DIAGNOSIS — I480 Paroxysmal atrial fibrillation: Secondary | ICD-10-CM

## 2014-04-08 DIAGNOSIS — Z713 Dietary counseling and surveillance: Secondary | ICD-10-CM | POA: Insufficient documentation

## 2014-04-08 LAB — POCT INR: INR: 2.8

## 2014-04-08 NOTE — Patient Instructions (Signed)
Begin exercising!  Walk 3-5 days a week for 10 minutes.  Walk briskly to get your heart rate up.  Slowly increase your walking time as you build up strength.  Try walking at the [facility] store, at the park, around your neighborhood with your dog, or consider buying a treadmill for your home.

## 2014-04-08 NOTE — Progress Notes (Signed)
Medical Nutrition Therapy:  Appt start time: 1200 end time:  1300.   Assessment:  Primary concerns today: obesity.  [name] lives with her husband in [place] (they recently moved into their "dream house"), her 4 children are grown and live outside the home.  She works second shift, but is currently on medical leave for back pain.  Normally she works 3pm-11pm at [employer] and sits throughout work.  She normally sleeps in and has her first meal after 5pm.    Exercise is limited due to back and knee pain.  Her and her husband eat out about 4 times a week to places like [employer], [employer], and [employer].  She states her husband also keeps a lot of "junk food" around the house like potato chips, cheetos, etc. which is very tempting to her.  Weight history: 110 lb as young adult, 210 lbs 6 years ago, 268 lbs today.  Patient expresses change talk and states she has felt weight bias through statements from friends and feeling judged by others.  She is motivated to lose weight and get healthier.  Her son has been pushing her to see a nutritionist for a while.  Her husband has been supporting her by taking her to the park to walk and plans to be at our next follow-up visit.  Learning Readiness:  Contemplating  Ready  MEDICATIONS: see list   DIETARY INTAKE: time constraints did not allow dietary recall at this time. Patient is eating frequently outside of the home and eating foods she "knows I shouldn't eat."  Some healthy items she likes are boiled eggs and oatmeal.  Did not assess beverage intake at this time.  Will perform detailed dietary recall at next visit.  Usual physical activity: none currently  Progress Towards Goal(s):  Not yet started   Nutritional Diagnosis:  Evan-3.3 Overweight/obesity As related to frequent consumption of processed foods high in refined carbohydrates and limited physical activity.  As evidenced by patient report and BMI of 46.9.    Intervention:  Nutrition  counseling.  Today's focus was building a report with the patient, understanding her goals and concerns, and setting an exercise goal.  Discussed mindful eating, overcoming a dieting mentality and "last supper" binge eating behavior.  Stated that our goals will be to set small, attainable goals that become new healthy habits and sustain weight loss over time.  Counseled patient through fears of being judged when starting exercise in her neighborhood, etc.  Patient provided lots of change talk and I assess she is in the preparation stage of change.  She was very excited by the walking goals we discussed and was able to create an action plan for herself to start walking for exercise.    Goals:  - Begin exercising!  Walk 3-5 days a week for 10 minutes.  Walk briskly to get your heart rate up.  Slowly increase your walking time as you build up strength.  Try walking at the [facility] store, at the park, around your neighborhood with your dog, or consider buying a treadmill for your home. - Discuss the changes you want to make with your husband.  Consider making some changes together as a family, such as setting up the house as a safe zone with healthy food options. - Pay attention to the taste, smells, flavors, and textures of your food.  Practice mindful eating, listening to hunger and fullness.  Teaching Method Utilized: Auditory  Barriers to learning/adherence to lifestyle change: none  Demonstrated degree of understanding via:  Teach Back   Monitoring/Evaluation:  Dietary intake, exercise, and body weight in 3 week(s).  At next visit plan is to establish more dietary goals for weight loss and heart health.

## 2014-04-12 ENCOUNTER — Other Ambulatory Visit: Payer: Self-pay | Admitting: Internal Medicine

## 2014-04-12 DIAGNOSIS — M545 Low back pain, unspecified: Secondary | ICD-10-CM

## 2014-04-13 ENCOUNTER — Encounter: Payer: Self-pay | Admitting: *Deleted

## 2014-04-13 ENCOUNTER — Telehealth: Payer: Self-pay | Admitting: *Deleted

## 2014-04-13 MED ORDER — ATORVASTATIN CALCIUM 40 MG PO TABS: 40.0000 mg | ORAL_TABLET | Freq: Every day | ORAL | Status: DC

## 2014-04-13 NOTE — Telephone Encounter (Signed)
-----   Message from Troy Sine, MD sent at 04/13/2014 11:23 AM EST ----- Inc atorvastatin to 40 mg and add fish oil 2 cap daily

## 2014-04-13 NOTE — Telephone Encounter (Signed)
Patient notified of lab results and recommendations. She voiced verbal understanding of the results. New atorvastatin prescription sent to patient's pharmacy.

## 2014-04-19 ENCOUNTER — Other Ambulatory Visit: Payer: Self-pay

## 2014-04-20 MED ORDER — HYDRALAZINE HCL 25 MG PO TABS: 25.0000 mg | ORAL_TABLET | Freq: Two times a day (BID) | ORAL | Status: DC

## 2014-04-20 NOTE — Telephone Encounter (Signed)
Rx(s) sent to pharmacy electronically.  

## 2014-04-27 ENCOUNTER — Ambulatory Visit
Admission: RE | Admit: 2014-04-27 | Discharge: 2014-04-27 | Disposition: A | Discharge status: Home / Self Care | Payer: Private Health Insurance - Indemnity | Source: Ambulatory Visit | Attending: Internal Medicine | Admitting: Internal Medicine

## 2014-04-27 DIAGNOSIS — M545 Low back pain, unspecified: Secondary | ICD-10-CM

## 2014-05-01 ENCOUNTER — Encounter: Payer: Private Health Insurance - Indemnity | Attending: Nephrology | Admitting: Dietician

## 2014-05-01 DIAGNOSIS — Z6841 Body Mass Index (BMI) 40.0 and over, adult: Secondary | ICD-10-CM | POA: Insufficient documentation

## 2014-05-01 DIAGNOSIS — Z713 Dietary counseling and surveillance: Secondary | ICD-10-CM | POA: Insufficient documentation

## 2014-05-01 NOTE — Progress Notes (Signed)
  Medical Nutrition Therapy:  Appt start time: 1400 end time:  1500.  Follow up for weight management. Patient states she has done "more" over the past few weeks with watching her food and trying to be active then ever before even though she states it is "not much."  She is dusting or moving things around her new home, walking some but not reaching our stated goals from last visit, and is being more aware of what she is eating.  She has started eating breakfast when she used to skip.  Her weight has maintained through the holidays.  Continued back pain now diagnosed as "arthritic spine" per patient.  Was out of work but recently started back working her 3-11pm shift.  Tanita Scale Measurements: Weight 265.5 lbs, BMI 45.6, Fat % 51.3%, Fat Mass 136 lbs, FFM 129.5 lbs, TBW 95 lbs   Dietary Recall:  B - used to skip, oatmeal packet OR honey nut cheerios with banana with whole milk and coffee OR two hard boiled eggs and 3 slices bacon L - out to eat at [facility], etc. S - cashews or cheetos at work D - canned peas, salads, out to eat S - chips, cheetos Bev - coffee with sugar or splenda, water  Progress Towards Goal(s):  In progress   Nutritional Diagnosis:  [place]-3.3 Overweight/obesity continues.    Intervention:  Nutrition counseling.  Encouraged looking at food habits without labeling "good" or "bad" and without judgement but to learn from past food choices/temptations.  Encouraged continued walking and recommend beginning chair exercises.  Utilized MyPlate and the Stop [employer].  Patient likes both of these resources and is excited to use them to increase vegetable intake and eat colorful foods.  Patient asked for sample meal plan.  Provided sample 5-day 1,500 calorie meal plan.  Discussed appropriate weight loss goals.  Patient's goal is to lose 10 lbs by her birthday on [birth year].  Continued Goals:  - Begin exercising!  Walk 3-5 days a week for 10 minutes.  Walk briskly to get your heart  rate up.  Slowly increase your walking time as you build up strength.  Try walking at the [facility] store, at the park, around your neighborhood with your dog, or consider buying a treadmill for your home.  Look up chair exercise routines online. Update: First week walked 2 times, next week 1 time.  Then walked inside of house for less 5-10 min. Back pain from arthritis of spine. - Discuss the changes you want to make with your husband.  Consider making some changes together as a family, such as setting up the house as a safe zone with healthy food options. Update: Husband had symptoms of heart attack which ended up being acute renal failure.  He has since been cutting back on unhealthy foods and buying less "junk foods" for the house.   - Pay attention to the taste, smells, flavors, and textures of your food.  Practice mindful eating, listening to hunger and fullness.  Materials Given: - MyPlate - [employer] Food Guide - 1500 Calorie Sample 5-day menu   Teaching Method Utilized: Auditory Visual  Barriers to learning/adherence to lifestyle change: none  Demonstrated degree of understanding via:  Teach Back   Monitoring/Evaluation:  Dietary intake, exercise, and body weight in 3 month(s).

## 2014-05-07 ENCOUNTER — Ambulatory Visit (INDEPENDENT_AMBULATORY_CARE_PROVIDER_SITE_OTHER): Payer: Private Health Insurance - Indemnity | Admitting: Pharmacist Clinician (PhC)/ Clinical Pharmacy Specialist

## 2014-05-07 DIAGNOSIS — Z7901 Long term (current) use of anticoagulants: Secondary | ICD-10-CM

## 2014-05-07 DIAGNOSIS — I480 Paroxysmal atrial fibrillation: Secondary | ICD-10-CM

## 2014-05-07 LAB — BASIC METABOLIC PANEL
BUN: 19 mg/dL (ref 6–23)
CO2: 22 mEq/L (ref 19–32)
CREATININE: 1.05 mg/dL (ref 0.50–1.10)
Calcium: 8.9 mg/dL (ref 8.4–10.5)
Chloride: 107 mEq/L (ref 96–112)
GLUCOSE: 114 mg/dL — AB (ref 70–99)
POTASSIUM: 4.1 meq/L (ref 3.5–5.3)
SODIUM: 144 meq/L (ref 135–145)

## 2014-05-07 LAB — POCT INR: INR: 3.1

## 2014-05-11 ENCOUNTER — Telehealth: Payer: Self-pay | Admitting: Pharmacist Clinician (PhC)/ Clinical Pharmacy Specialist

## 2014-05-11 NOTE — Telephone Encounter (Signed)
Pt in office Friday for INR check, needed refill on torsemide - states instructions say 2 tabs (40 mg) daily, but TK told her to take 40 mg bid.  Has been taking bid since December visit.  Per office note he recommended 40mg  am and extra dose prn.  Sent pt to lab to verify renal function with current dosing.  SCr fine at 1.05  Pt reports only minor edema at times, feeling better with the twice daily routine.    Will forward to Dr. Claiborne Billings for approval to continue with 40 mg bid

## 2014-05-15 NOTE — Telephone Encounter (Signed)
Ok to renew?  

## 2014-05-16 ENCOUNTER — Other Ambulatory Visit: Payer: Self-pay | Admitting: Pharmacist Clinician (PhC)/ Clinical Pharmacy Specialist

## 2014-05-16 MED ORDER — TORSEMIDE 20 MG PO TABS: ORAL_TABLET | ORAL | Status: DC

## 2014-05-22 ENCOUNTER — Other Ambulatory Visit: Payer: Self-pay | Admitting: Cardiovascular Disease

## 2014-06-11 ENCOUNTER — Other Ambulatory Visit: Payer: Self-pay | Admitting: *Deleted

## 2014-06-11 ENCOUNTER — Ambulatory Visit (INDEPENDENT_AMBULATORY_CARE_PROVIDER_SITE_OTHER): Payer: Private Health Insurance - Indemnity | Admitting: Pharmacist Clinician (PhC)/ Clinical Pharmacy Specialist

## 2014-06-11 DIAGNOSIS — I480 Paroxysmal atrial fibrillation: Secondary | ICD-10-CM

## 2014-06-11 DIAGNOSIS — Z7901 Long term (current) use of anticoagulants: Secondary | ICD-10-CM

## 2014-06-11 LAB — POCT INR: INR: 6.3

## 2014-06-11 MED ORDER — ATORVASTATIN CALCIUM 40 MG PO TABS: 40.0000 mg | ORAL_TABLET | Freq: Every day | ORAL | Status: DC

## 2014-06-11 MED ORDER — TORSEMIDE 20 MG PO TABS: ORAL_TABLET | ORAL | Status: DC

## 2014-06-11 NOTE — Telephone Encounter (Signed)
Rx(s) sent to pharmacy electronically.  

## 2014-06-22 ENCOUNTER — Telehealth: Payer: Self-pay | Admitting: Cardiovascular Disease

## 2014-06-23 NOTE — Telephone Encounter (Signed)
Close encounter 

## 2014-06-28 ENCOUNTER — Ambulatory Visit: Payer: Private Health Insurance - Indemnity | Admitting: Cardiovascular Disease

## 2014-08-24 ENCOUNTER — Encounter: Payer: Self-pay | Admitting: Cardiovascular Disease

## 2014-08-24 ENCOUNTER — Telehealth: Payer: Self-pay

## 2014-08-24 ENCOUNTER — Ambulatory Visit (INDEPENDENT_AMBULATORY_CARE_PROVIDER_SITE_OTHER): Payer: Private Health Insurance - Indemnity

## 2014-08-24 ENCOUNTER — Ambulatory Visit (INDEPENDENT_AMBULATORY_CARE_PROVIDER_SITE_OTHER): Payer: Private Health Insurance - Indemnity | Admitting: Cardiovascular Disease

## 2014-08-24 VITALS — BP 122/74 | HR 70 | Ht 63.6 in | Wt 262.3 lb

## 2014-08-24 DIAGNOSIS — R600 Localized edema: Secondary | ICD-10-CM

## 2014-08-24 DIAGNOSIS — I480 Paroxysmal atrial fibrillation: Secondary | ICD-10-CM

## 2014-08-24 DIAGNOSIS — Z9989 Dependence on other enabling machines and devices: Secondary | ICD-10-CM

## 2014-08-24 DIAGNOSIS — Z7901 Long term (current) use of anticoagulants: Secondary | ICD-10-CM

## 2014-08-24 DIAGNOSIS — G4733 Obstructive sleep apnea (adult) (pediatric): Secondary | ICD-10-CM | POA: Diagnosis not present

## 2014-08-24 LAB — POCT INR: INR: 2.4

## 2014-08-24 MED ORDER — WARFARIN SODIUM 5 MG PO TABS: ORAL_TABLET | ORAL | Status: DC

## 2014-08-24 NOTE — Patient Instructions (Signed)
Your physician wants you to follow-up in: 6 months or sooner if needed. You will receive a reminder letter in the mail two months in advance. If you don't receive a letter, please call our office to schedule the follow-up appointment. 

## 2014-08-25 ENCOUNTER — Encounter: Payer: Self-pay | Admitting: Cardiovascular Disease

## 2014-08-25 NOTE — Progress Notes (Signed)
Patient ID: Caitlyn Fuentes, female   DOB: 17-Jul-1955, 58 y.o.   MRN: [id number]    HPI: Caitlyn Fuentes is a 59 y.o. female who presents to the office today for a 6 month follow up cardiology evaluation.  Ms Caitlyn Fuentes  initially presented with atrial fibrillation in 2008 was found to have a nonischemic cardiomyopathy.  Cardiac catheterization did not show significant coronary obstructive disease and she was found to have mild pulmonary hypertension.   She has obstructive sleep apnea documented since 2008 and has been using CPAP.  She had undergone a follow-up sleep study in 2013, but never follow-up with getting a new CPAP machine.  She uses [employer] for her [employer].   Additional problems include with obesity, hypertension, mild renal insufficiency, as well as lower extremity edema.  There also is a history of lupus as well as gout.  She has been felt to have venous reflux disease with deep venous insufficiency within the left common femoral vein and right and left greater saphenous veins have demonstrated valvular insufficiency, as well as the left short saphenous vein.    Over the past year, she denies any episodes of chest pain.  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  She notes shortness of breath with talking and walking.  She also admits to lower extremity edema.  She works to 10:57 PM shift.  She has been taking her torsemide intermittently and when she does she often takes a dose prior to going to bed as well as in the morning.  She often wakes up several times per night for urination.  She also has been taking diltiazem 120 mg as well as long-acting carvedilol 80 mg daily.  She will is maintained on Coumadin anticoagulation.  She denies recurrent episodes of atrial fibrillation.  She continues to be on atorvastatin 20 mg for hyperlipidemia, as well as allupurinol for gout.  She  presents for follow-up evaluation.  Past Medical History  Diagnosis Date  . A-fib   . Cardiomyopathy   . Sleep apnea 2008    CPAP  . Thyroid disease   . Renal disorder   . Lupus   . Hypertension   . Pulmonary hypertension 05/10/2011    Echo, EF-40-45  . Chest pain 09/21/2011    Nuc,     Past Surgical History  Procedure Laterality Date  . Cardiac catheterization      Nonischemic, EF-40, continued medical therapy  . Cardiac catheterization  10/02/2006    No significant CAD    Allergies  Allergen Reactions  . Diovan [Valsartan] Other (See Comments)    Messes up kidneys  . Lisinopril Swelling    Facial swelling    Current Outpatient Prescriptions  Medication Sig Dispense Refill  . allopurinol (ZYLOPRIM) 100 MG tablet Take 200 mg by mouth daily.     Marland Kitchen atorvastatin (LIPITOR) 40 MG tablet Take 1 tablet (40 mg total) by mouth daily. 90 tablet 2  . carvedilol (COREG CR) 80 MG 24 hr capsule Take 80 mg by mouth daily.    . colchicine 0.6 MG tablet Take 0.6 mg by mouth daily.    Marland Kitchen estradiol (ESTRACE) 2 MG tablet Take 1 tablet by mouth.    . ferrous sulfate 325 (65 FE) MG tablet Take 325 mg by mouth daily with breakfast.    . hydrALAZINE (APRESOLINE) 25 MG tablet Take 1 tablet (25 mg total) by mouth 2 (  two) times daily. 180 tablet 1  . Multiple Vitamin (MULITIVITAMIN WITH MINERALS) TABS Take 1 tablet by mouth daily.    Marland Kitchen nystatin-triamcinolone (MYCOLOG II) cream Apply 1 application topically.    Marland Kitchen oxyCODONE-acetaminophen (PERCOCET/ROXICET) 5-325 MG per tablet Take 1 tablet by mouth as needed.    . paricalcitol (ZEMPLAR) 1 MCG capsule Take 1 mcg by mouth daily.    . potassium chloride 20 MEQ/15ML (10%) SOLN Take 15 mLs (20 mEq total) by mouth daily. 900 mL 3  . torsemide (DEMADEX) 20 MG tablet Take 2 tablets (83m) by mouth twice daily 360 tablet 2  . warfarin (COUMADIN) 5 MG tablet Take 1 to 1.5 tablets by mouth daily as directed by coumadin clinic 45 tablet 6  . warfarin  (COUMADIN) 7.5 MG tablet Take 5-7.5 mg by mouth daily. Take 544mon Mon & Fri ONLY Take 7.73m47mvery other day including Sun    . zolpidem (AMBIEN) 10 MG tablet Take 10 mg by mouth at bedtime as needed for sleep.     . lMarland Kitchenvothyroxine (SYNTHROID, LEVOTHROID) 25 MCG tablet Take 25 mcg by mouth daily.  4   No current facility-administered medications for this visit.    History   Social History  . Marital Status: Married    Spouse Name: N/A  . Number of Children: N/A  . Years of Education: N/A   Occupational History  . Not on file.   Social History Main Topics  . Smoking status: Never Smoker   . Smokeless tobacco: Not on file  . Alcohol Use: Yes  . Drug Use: No  . Sexual Activity: Not on file   Other Topics Concern  . Not on file   Social History Narrative   Socially, she is married and has 4 children, one grandchild.  There is no tobacco or alcohol use.  She does not routinely exercise.  Family History  Problem Relation Age of Onset  . Heart disease Mother   . Heart disease Father     ROS General: Negative; No fevers, chills, or night sweats HEENT: Negative; No changes in vision or hearing, sinus congestion, difficulty swallowing Pulmonary: Negative; No cough, wheezing, shortness of breath, hemoptysis Cardiovascular: See HPI: No chest pain, presyncope, syncope, palpatations, intermitent leg swelling with venous insufficiency GI: Negative; No nausea, vomiting, diarrhea, or abdominal pain GU: Negative; No dysuria, hematuria, or difficulty voiding Musculoskeletal: Negative; no myalgias, joint pain, or weakness Hematologic: Negative; no easy bruising, bleeding Endocrine: Negative; no heat/cold intolerance; no diabetes, Rheumatologic: Positive for history of gout Neuro: Negative; no changes in balance, headaches Skin:  Malar rash Psychiatric: Negative; No behavioral problems, depression Sleep:  Positive for sleep apnea;  No snoring, daytime sleepiness, hypersomnolence,  bruxism, restless legs, hypnogognic hallucinations. Other comprehensive 14 point system review is negative   Physical Exam BP 122/74 mmHg  Pulse 70  Ht 5' 3.6" (1.615 m)  Wt 262 lb 4.8 oz (118.978 kg)  BMI 45.62 kg/m2   Wt Readings from Last 3 Encounters:  08/24/14 262 lb 4.8 oz (118.978 kg)  05/01/14 265 lb 8 oz (120.43 kg)  04/08/14 268 lb 9.6 oz (121.836 kg)   General: Alert, oriented, no distress.  Morbid obesity Skin: normal turgor, no rashes, warm and dry HEENT: Normocephalic, atraumatic. Pupils equal round and reactive to light; sclera anicteric; extraocular muscles intact, No lid lag; Nose without nasal septal hypertrophy; Mouth/Parynx benign; Mallinpatti scale 3/4 Neck: Thick neck No JVD, no carotid bruits; normal carotid upstroke Lungs: clear to ausculatation and percussion  bilaterally; no wheezing or rales, normal inspiratory and expiratory effort Chest wall: without tenderness to palpitation Heart: PMI not displaced, RRR, s1 s2 normal, 2/6systolic murmur, No diastolic murmur, no rubs, gallops, thrills, or heaves Abdomen: Moderately severe central adiposity soft, nontender; no hepatosplenomehaly, BS+; abdominal aorta nontender and not dilated by palpation. Back: no CVA tenderness Pulses: 2+  Musculoskeletal: full range of motion, normal strength, no joint deformities Extremities: Pulses 2+, venous stasis changes; with 1-2+ lower extremity edema bilaterally;no clubbing cyanosis; Homan's sign negative  Neurologic: grossly nonfocal; Cranial nerves grossly wnl Psychologic: Normal mood and affect  ECG (independently read by me): Sinus rhythm with ventricular rate at 70 bpm.  T-wave inversion in leads 3 and aVF.  QTc interval 494 ms.  ECG (independently read by me): Normal sinus rhythm at 78 beats per minute.  LVH by voltage criteria in aVL.  No significant ST changes. QTc interval 437 ms.  LABS:  BMP Latest Ref Rng 05/07/2014 03/26/2014 08/16/2011  Glucose 70 - 99 mg/dL  114(H) 103(H) 98  BUN 6 - 23 mg/dL 19 20 19   Creatinine 0.50 - 1.10 mg/dL 1.05 1.20(H) 1.29(H)  Sodium 135 - 145 mEq/L 144 142 143  Potassium 3.5 - 5.3 mEq/L 4.1 3.4(L) 3.7  Chloride 96 - 112 mEq/L 107 101 102  CO2 19 - 32 mEq/L 22 31 30   Calcium 8.4 - 10.5 mg/dL 8.9 9.0 9.8   Hepatic Function Latest Ref Rng 03/26/2014 09/13/2009 10/08/2008  Total Protein 6.0 - 8.3 g/dL 7.2 6.2 7.9  Albumin 3.5 - 5.2 g/dL 4.0 3.3(L) 4.3  AST 0 - 37 U/L 26 16 27   ALT 0 - 35 U/L 32 17 20  Alk Phosphatase 39 - 117 U/L 96 46 90  Total Bilirubin 0.2 - 1.2 mg/dL 0.6 0.7 0.4   CBC Latest Ref Rng 03/26/2014 05/28/2013 08/16/2011  WBC 4.0 - 10.5 K/uL 4.1 4.2 4.1  Hemoglobin 12.0 - 15.0 g/dL 11.8(L) 12.3 11.6(L)  Hematocrit 36.0 - 46.0 % 35.8(L) 36.7 33.9(L)  Platelets 150 - 400 K/uL 385 425(H) 305   Lab Results  Component Value Date   TSH 2.808 03/26/2014   Lab Results  Component Value Date   HGBA1C  09/13/2009    5.0 (NOTE)                                                                       According to the ADA Clinical Practice Recommendations for 2011, when HbA1c is used as a screening test:   >=6.5%   Diagnostic of Diabetes Mellitus           (if abnormal result  is confirmed)  5.7-6.4%   Increased risk of developing Diabetes Mellitus  References:Diagnosis and Classification of Diabetes Mellitus,Diabetes KCMK,3491,79(XTAVW 1):S62-S69 and Standards of Medical Care in         Diabetes - 2011,Diabetes PVXY,8016,55  (Suppl 1):S11-S61.   Lipid Panel     Component Value Date/Time   CHOL 207* 03/26/2014 1035   TRIG 256* 03/26/2014 1035   HDL 39* 03/26/2014 1035   CHOLHDL 5.3 03/26/2014 1035   VLDL 51* 03/26/2014 1035   LDLCALC 117* 03/26/2014 1035    INR today in office: 2.4  RADIOLOGY: No results found.    ASSESSMENT AND  PLAN: Ms. Iovine is a 59 year old obese African-American female has a history of previous nonischemic cardiomyopathy with an ejection fraction of approximately 35% initially.   Subsequent echo Doppler study in 2013 showed her ejection fraction at 40-45%.  She has been maintaining normal sinus rhythm and has not had recurrent atrial fibrillation.  She has venous insufficiency and has bilateral lower extremity edema 1-2+ today.  She's not been using support stockings.  She has a history of hypertension.  She has been taking torsemide 40 mg at bedtime and I've suggested that she change this and take 40 mg in the  morning and that she can take a extremity edema neck in the afternoon prior to going to work from 3-11.  late afternoon if she notes recurrent leg edema.  Her blood pressure today was 134/90.  In the past.  She's had in tolerability with facial swelling secondary to lisinopril as well as Diovan.  I last saw her, I added hydralazine 25 mg twice a day for improved blood pressure control.  She has tolerated this well.  Her blood pressure today is significantly improved and stable. She is morbidly obese with a body mass index of 45.62 and has gained mild weight since her last visit. I have only recommended weight loss, strict sodium restriction as well as exercise.  I  again recommended compression stockings.  Laboratory was reviewed.  Her INR is therapeutic at 2.4.  She's not having bleeding issues.  I will see her in 6 months for reevaluation.  Troy Sine, MD, [facility]  08/25/2014 7:29 PM

## 2014-09-20 ENCOUNTER — Other Ambulatory Visit: Payer: Self-pay | Admitting: Cardiovascular Disease

## 2014-09-21 ENCOUNTER — Other Ambulatory Visit: Payer: Self-pay | Admitting: *Deleted

## 2014-09-21 MED ORDER — CARVEDILOL PHOSPHATE ER 80 MG PO CP24: 80.0000 mg | ORAL_CAPSULE | Freq: Every day | ORAL | Status: DC

## 2014-09-21 NOTE — Telephone Encounter (Signed)
Rx(s) sent to pharmacy electronically.  

## 2014-09-22 NOTE — Telephone Encounter (Signed)
I have attempted to contact patient on several occasions and have left messages regarding next INR date. Patient has not returned call. Will mail anticoagulation avs to patient.

## 2014-10-05 ENCOUNTER — Other Ambulatory Visit: Payer: Self-pay | Admitting: Pharmacist Clinician (PhC)/ Clinical Pharmacy Specialist

## 2014-10-05 ENCOUNTER — Ambulatory Visit (INDEPENDENT_AMBULATORY_CARE_PROVIDER_SITE_OTHER): Payer: Private Health Insurance - Indemnity | Admitting: Pharmacist Clinician (PhC)/ Clinical Pharmacy Specialist

## 2014-10-05 DIAGNOSIS — I480 Paroxysmal atrial fibrillation: Secondary | ICD-10-CM

## 2014-10-05 DIAGNOSIS — Z7901 Long term (current) use of anticoagulants: Secondary | ICD-10-CM

## 2014-10-05 LAB — POCT INR: INR: 1.7

## 2014-10-05 MED ORDER — WARFARIN SODIUM 5 MG PO TABS: ORAL_TABLET | ORAL | Status: DC

## 2014-10-19 ENCOUNTER — Ambulatory Visit: Payer: Private Health Insurance - Indemnity | Admitting: Pharmacist Clinician (PhC)/ Clinical Pharmacy Specialist

## 2014-11-25 ENCOUNTER — Telehealth: Payer: Self-pay | Admitting: Pharmacist Clinician (PhC)/ Clinical Pharmacy Specialist

## 2014-11-26 NOTE — Telephone Encounter (Signed)
Close encounter 

## 2015-01-22 ENCOUNTER — Other Ambulatory Visit: Payer: Self-pay | Admitting: Cardiovascular Disease

## 2015-01-24 NOTE — Telephone Encounter (Signed)
Rx(s) sent to pharmacy electronically.  

## 2015-02-03 ENCOUNTER — Ambulatory Visit (INDEPENDENT_AMBULATORY_CARE_PROVIDER_SITE_OTHER): Payer: Managed Care, Other (non HMO) | Admitting: Pharmacist

## 2015-02-03 DIAGNOSIS — I480 Paroxysmal atrial fibrillation: Secondary | ICD-10-CM | POA: Diagnosis not present

## 2015-02-03 DIAGNOSIS — Z7901 Long term (current) use of anticoagulants: Secondary | ICD-10-CM

## 2015-02-03 LAB — POCT INR: INR: 3.9

## 2015-02-18 ENCOUNTER — Ambulatory Visit (INDEPENDENT_AMBULATORY_CARE_PROVIDER_SITE_OTHER): Payer: Managed Care, Other (non HMO) | Admitting: Pharmacist Clinician (PhC)/ Clinical Pharmacy Specialist

## 2015-02-18 DIAGNOSIS — I480 Paroxysmal atrial fibrillation: Secondary | ICD-10-CM

## 2015-02-18 DIAGNOSIS — Z7901 Long term (current) use of anticoagulants: Secondary | ICD-10-CM

## 2015-02-18 LAB — POCT INR: INR: 4

## 2015-03-04 ENCOUNTER — Ambulatory Visit: Payer: Managed Care, Other (non HMO) | Admitting: Pharmacist Clinician (PhC)/ Clinical Pharmacy Specialist

## 2015-03-07 ENCOUNTER — Ambulatory Visit: Payer: Managed Care, Other (non HMO) | Admitting: Pharmacist Clinician (PhC)/ Clinical Pharmacy Specialist

## 2015-03-07 ENCOUNTER — Ambulatory Visit: Payer: Private Health Insurance - Indemnity | Admitting: Cardiovascular Disease

## 2015-03-30 ENCOUNTER — Other Ambulatory Visit: Payer: Self-pay | Admitting: Cardiovascular Disease

## 2015-03-30 NOTE — Telephone Encounter (Signed)
Rx request sent to pharmacy.  

## 2015-04-15 ENCOUNTER — Other Ambulatory Visit: Payer: Self-pay | Admitting: Cardiovascular Disease

## 2015-04-15 NOTE — Telephone Encounter (Signed)
Rx request sent to pharmacy.  

## 2015-04-29 ENCOUNTER — Emergency Department (INDEPENDENT_AMBULATORY_CARE_PROVIDER_SITE_OTHER)
Admission: EM | Admit: 2015-04-29 | Discharge: 2015-04-29 | Disposition: A | Discharge status: Home / Self Care | Payer: Managed Care, Other (non HMO) | Source: Home / Self Care | Attending: Family Medicine | Admitting: Family Medicine

## 2015-04-29 ENCOUNTER — Encounter: Payer: Self-pay | Admitting: Emergency Medicine

## 2015-04-29 DIAGNOSIS — L03211 Cellulitis of face: Secondary | ICD-10-CM

## 2015-04-29 DIAGNOSIS — B372 Candidiasis of skin and nail: Secondary | ICD-10-CM | POA: Diagnosis not present

## 2015-04-29 MED ORDER — CLOTRIMAZOLE 1 % EX CREA: TOPICAL_CREAM | CUTANEOUS | Status: DC

## 2015-04-29 MED ORDER — DOXYCYCLINE HYCLATE 100 MG PO CAPS: 100.0000 mg | ORAL_CAPSULE | Freq: Two times a day (BID) | ORAL | Status: DC

## 2015-04-29 NOTE — ED Provider Notes (Signed)
CSN: [id number]     Arrival date & time 04/29/15  1308 History   First MD Initiated Contact with Patient 04/29/15 1327     Chief Complaint  Patient presents with  . Abscess      HPI Comments: Patient states that she developed some "bumps" in her left axilla about two weeks ago that became increasingly irritated.  Her right axilla later became similarly affected.  Later, she developed oozing fluid from both axillae.  About 8 days ago she developed swelling and erythema in her right cheek that gradually migrated to her right chin.  Her chin became painful and drained a significant amount of purulent fluid last night.  She states that her right cheek is no longer painful.  She feels well otherwise, and no fevers, chills, and sweats.  She has a persistent draining area on her right chin.  Patient is a 60 y.o. female presenting with rash. The history is provided by the patient.  Rash Location:  Face (axillae) Facial rash location:  Chin Quality: draining, painful, redness and swelling   Pain details:    Quality:  Aching   Severity:  Mild   Onset quality:  Gradual   Duration:  2 weeks   Timing:  Constant   Progression:  Improving Severity:  Mild Onset quality:  Gradual Duration:  2 weeks Progression:  Spreading Chronicity:  New Context: not animal contact, not chemical exposure, not exposure to similar rash and not food   Relieved by:  Nothing Worsened by:  Moisture Ineffective treatments:  None tried Associated symptoms: no fatigue, no fever, no induration, no joint pain, no myalgias, no nausea, no periorbital edema, no sore throat and no URI     Past Medical History  Diagnosis Date  . A-fib ([place])   . Cardiomyopathy   . Sleep apnea 2008    CPAP  . Thyroid disease   . Renal disorder   . Lupus ([place])   . Hypertension   . Pulmonary hypertension ([place]) 05/10/2011    Echo, EF-40-45  . Chest pain 09/21/2011    Nuc,    Past Surgical History  Procedure Laterality Date  . Cardiac  catheterization      Nonischemic, EF-40, continued medical therapy  . Cardiac catheterization  10/02/2006    No significant CAD   Family History  Problem Relation Age of Onset  . Heart disease Mother   . Heart disease Father    Social History  Substance Use Topics  . Smoking status: Never Smoker   . Smokeless tobacco: None  . Alcohol Use: Yes   OB History    No data available     Review of Systems  Constitutional: Negative for fever and fatigue.  HENT: Negative for sore throat.   Gastrointestinal: Negative for nausea.  Musculoskeletal: Negative for myalgias and arthralgias.  Skin: Positive for rash.  All other systems reviewed and are negative.   Allergies  Diovan and Lisinopril  Home Medications   Prior to Admission medications   Medication Sig Start Date End Date Taking? Authorizing Provider  allopurinol (ZYLOPRIM) 100 MG tablet Take 200 mg by mouth daily.  07/09/13   Historical Provider, MD  atorvastatin (LIPITOR) 40 MG tablet TAKE 1 TABLET (40 MG TOTAL) BY MOUTH DAILY. 03/30/15   Troy Sine, MD  carvedilol (COREG CR) 80 MG 24 hr capsule Take 1 capsule (80 mg total) by mouth daily. 09/21/14   Troy Sine, MD  clotrimazole (LOTRIMIN) 1 % cream Apply to areas  under arms twice daily until healed. 04/29/15   Kandra Nicolas, MD  colchicine 0.6 MG tablet Take 0.6 mg by mouth daily.    Historical Provider, MD  COREG CR 80 MG 24 hr capsule TAKE ONE CAPSULE BY MOUTH EVERY DAY 09/21/14   Troy Sine, MD  doxycycline (VIBRAMYCIN) 100 MG capsule Take 1 capsule (100 mg total) by mouth 2 (two) times daily. Take with food. 04/29/15   Kandra Nicolas, MD  estradiol (ESTRACE) 2 MG tablet Take 1 tablet by mouth. 03/22/14   Historical Provider, MD  ferrous sulfate 325 (65 FE) MG tablet Take 325 mg by mouth daily with breakfast.    Historical Provider, MD  hydrALAZINE (APRESOLINE) 25 MG tablet Take 1 tablet (25 mg total) by mouth 2 (two) times daily. 01/24/15   Troy Sine, MD    levothyroxine (SYNTHROID, LEVOTHROID) 25 MCG tablet Take 25 mcg by mouth daily. 07/14/14   Historical Provider, MD  Multiple Vitamin (MULITIVITAMIN WITH MINERALS) TABS Take 1 tablet by mouth daily.    Historical Provider, MD  nystatin-triamcinolone (MYCOLOG II) cream Apply 1 application topically. 03/22/14   Historical Provider, MD  oxyCODONE-acetaminophen (PERCOCET/ROXICET) 5-325 MG per tablet Take 1 tablet by mouth as needed. 02/19/14   Historical Provider, MD  paricalcitol (ZEMPLAR) 1 MCG capsule Take 1 mcg by mouth daily.    Historical Provider, MD  potassium chloride 20 MEQ/15ML (10%) SOLN Take 15 mLs (20 mEq total) by mouth daily. 02/25/14   Troy Sine, MD  torsemide (DEMADEX) 20 MG tablet TAKE 2 TABLETS (40MG ) BY MOUTH TWICE DAILY 04/15/15   Troy Sine, MD  warfarin (COUMADIN) 5 MG tablet Take 1 to 1.5 tablets by mouth daily as directed by coumadin clinic 10/05/14   Troy Sine, MD  zolpidem (AMBIEN) 10 MG tablet Take 10 mg by mouth at bedtime as needed for sleep.     Historical Provider, MD   Meds Ordered and Administered this Visit  Medications - No data to display  BP 151/81 mmHg  Pulse 75  Temp(Src) 97.4 F (36.3 C) (Oral)  Ht 5\' 4"  (1.626 m)  Wt 264 lb (119.75 kg)  BMI 45.29 kg/m2  SpO2 99% No data found.   Physical Exam  Constitutional: She is oriented to person, place, and time. She appears well-developed and well-nourished. No distress.  Patient is obese (BMI 45.3)  HENT:  Head:    Right Ear: External ear normal.  Left Ear: External ear normal.  Nose: Nose normal.  Mouth/Throat: Oropharynx is clear and moist.  Right aspect of chin is slightly swollen, tender to palpation, and erythematous with a small 86mm diameter central area of purulent drainage.  No swelling or tenderness to palpation in her right cheek.  Eyes: Conjunctivae are normal. Pupils are equal, round, and reactive to light.  Neck: Neck supple.  Cardiovascular: Normal heart sounds.    Pulmonary/Chest: Breath sounds normal.  Abdominal: There is no tenderness.  Lymphadenopathy:    She has no cervical adenopathy.  Neurological: She is alert and oriented to person, place, and time.  Skin: Skin is warm and dry.  Both axillae have well defined nontender erythematous areas approximately 3cm by 8cm.  No swelling or induration.   Nursing note and vitals reviewed.   ED Course  Procedures  None    Labs Reviewed  WOUND CULTURE   Narrative:    Performed at:  [facility]  [address], [id number]                [place], [place] [address]       MDM   1. Cellulitis, face; 2.  Candidiasis of axillae, with secondary cellulitis      Wound culture pending from lesion right chin. Begin doxycycline 100mg  BID.  Begin clotrimazole cream to axillae BID May apply 1% hydrocortisone cream to areas under arms twice daily until healed. Followup with anticoagulation Clinic in 3 days. Followup with dermatologist if not improved one week    Kandra Nicolas, MD 05/03/15 1601

## 2015-04-29 NOTE — ED Notes (Signed)
Multiple boils in both axilla, left breast x 3 weeks. Had her face waxed about 8 days ago then noticed boil on right chin.

## 2015-04-29 NOTE — Discharge Instructions (Signed)
May apply 1% hydrocortisone cream to areas under arms twice daily until healed. Followup with anticoagulation Clinic in 3 days.   Cellulitis Cellulitis is an infection of the skin and the tissue beneath it. The infected area is usually red and tender. Cellulitis occurs most often in the arms and lower legs.  CAUSES  Cellulitis is caused by bacteria that enter the skin through cracks or cuts in the skin. The most common types of bacteria that cause cellulitis are staphylococci and streptococci. SIGNS AND SYMPTOMS   Redness and warmth.  Swelling.  Tenderness or pain.  Fever. DIAGNOSIS  Your health care provider can usually determine what is wrong based on a physical exam. Blood tests may also be done. TREATMENT  Treatment usually involves taking an antibiotic medicine. HOME CARE INSTRUCTIONS   Take your antibiotic medicine as directed by your health care provider. Finish the antibiotic even if you start to feel better.  Keep the infected arm or leg elevated to reduce swelling.  Apply a warm cloth to the affected area up to 4 times per day to relieve pain.  Take medicines only as directed by your health care provider.  Keep all follow-up visits as directed by your health care provider. SEEK MEDICAL CARE IF:   You notice red streaks coming from the infected area.  Your red area gets larger or turns dark in color.  Your bone or joint underneath the infected area becomes painful after the skin has healed.  Your infection returns in the same area or another area.  You notice a swollen bump in the infected area.  You develop new symptoms.  You have a fever. SEEK IMMEDIATE MEDICAL CARE IF:   You feel very sleepy.  You develop vomiting or diarrhea.  You have a general ill feeling (malaise) with muscle aches and pains.   This information is not intended to replace advice given to you by your health care provider. Make sure you discuss any questions you have with your  health care provider.   Document Released: 01/17/2005 Document Revised: 12/29/2014 Document Reviewed: 06/25/2011 Elsevier Interactive Patient Education [employer].

## 2015-05-02 ENCOUNTER — Telehealth: Payer: Self-pay | Admitting: Emergency Medicine

## 2015-05-02 LAB — WOUND CULTURE: Gram Stain: NONE SEEN

## 2015-05-12 ENCOUNTER — Other Ambulatory Visit: Payer: Self-pay | Admitting: Cardiovascular Disease

## 2015-05-12 NOTE — Telephone Encounter (Signed)
Rx request sent to pharmacy.  

## 2015-05-13 ENCOUNTER — Other Ambulatory Visit: Payer: Self-pay | Admitting: Cardiovascular Disease

## 2015-05-25 ENCOUNTER — Ambulatory Visit (INDEPENDENT_AMBULATORY_CARE_PROVIDER_SITE_OTHER): Payer: Managed Care, Other (non HMO) | Admitting: Pharmacist Clinician (PhC)/ Clinical Pharmacy Specialist

## 2015-05-25 ENCOUNTER — Ambulatory Visit (INDEPENDENT_AMBULATORY_CARE_PROVIDER_SITE_OTHER): Payer: Managed Care, Other (non HMO) | Admitting: Physician Assistant

## 2015-05-25 ENCOUNTER — Ambulatory Visit (INDEPENDENT_AMBULATORY_CARE_PROVIDER_SITE_OTHER): Payer: Managed Care, Other (non HMO)

## 2015-05-25 ENCOUNTER — Encounter: Payer: Self-pay | Admitting: Physician Assistant

## 2015-05-25 VITALS — BP 116/80 | HR 66 | Ht 63.5 in | Wt 267.1 lb

## 2015-05-25 DIAGNOSIS — I5042 Chronic combined systolic (congestive) and diastolic (congestive) heart failure: Secondary | ICD-10-CM | POA: Insufficient documentation

## 2015-05-25 DIAGNOSIS — I480 Paroxysmal atrial fibrillation: Secondary | ICD-10-CM | POA: Diagnosis not present

## 2015-05-25 DIAGNOSIS — G4733 Obstructive sleep apnea (adult) (pediatric): Secondary | ICD-10-CM

## 2015-05-25 DIAGNOSIS — R002 Palpitations: Secondary | ICD-10-CM | POA: Insufficient documentation

## 2015-05-25 DIAGNOSIS — R0789 Other chest pain: Secondary | ICD-10-CM

## 2015-05-25 DIAGNOSIS — Z9989 Dependence on other enabling machines and devices: Secondary | ICD-10-CM

## 2015-05-25 DIAGNOSIS — Z7901 Long term (current) use of anticoagulants: Secondary | ICD-10-CM | POA: Diagnosis not present

## 2015-05-25 DIAGNOSIS — I428 Other cardiomyopathies: Secondary | ICD-10-CM | POA: Insufficient documentation

## 2015-05-25 DIAGNOSIS — I429 Cardiomyopathy, unspecified: Secondary | ICD-10-CM

## 2015-05-25 DIAGNOSIS — I5023 Acute on chronic systolic (congestive) heart failure: Secondary | ICD-10-CM

## 2015-05-25 LAB — CBC
HCT: 33.5 % — ABNORMAL LOW (ref 36.0–46.0)
Hemoglobin: 10.8 g/dL — ABNORMAL LOW (ref 12.0–15.0)
MCH: 29.3 pg (ref 26.0–34.0)
MCHC: 32.2 g/dL (ref 30.0–36.0)
MCV: 90.8 fL (ref 78.0–100.0)
MPV: 10.2 fL (ref 8.6–12.4)
PLATELETS: 360 10*3/uL (ref 150–400)
RBC: 3.69 MIL/uL — AB (ref 3.87–5.11)
RDW: 14.3 % (ref 11.5–15.5)
WBC: 3.7 10*3/uL — ABNORMAL LOW (ref 4.0–10.5)

## 2015-05-25 LAB — POCT INR: INR: 4

## 2015-05-25 NOTE — Progress Notes (Signed)
Patient ID: Caitlyn Fuentes, female   DOB: Aug 30, 1955, 60 y.o.   MRN: [id number]    Date:  05/25/2015   ID:  Caitlyn Fuentes, DOB July 10, 1955, MRN [id number]  PCP:  [name], MD  Primary Cardiologist:  Claiborne Billings   Chief Complaint  Patient presents with  . Advice Only    patient complains of having chest discomfort and SOB. increased fluid build up.     History of Present Illness: Caitlyn Fuentes is a 60 y.o. obese female who initially presented with atrial fibrillation in 2008 was found to have a nonischemic cardiomyopathy. Cardiac catheterization did not show significant coronary obstructive disease and she was found to have mild pulmonary hypertension.   She has obstructive sleep apnea documented since 2008 and has been using CPAP. She had undergone a follow-up sleep study in 2013, but never follow-up with getting a new CPAP machine. She uses [facility] for her [employer].   Additional problems include with obesity, hypertension, mild renal insufficiency, as well as lower extremity edema, gout, lupus. She has venous reflux disease with deep venous insufficiency within the left common femoral vein and right and left greater saphenous veins have demonstrated valvular insufficiency, as well as the left short saphenous vein.   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  Her last stress test was May 2013 and was nonischemic with an ejection fraction of 56%.   Her last echocardiogram was January 2013 ejection fraction was 40-45%.  She presents today with complaints of excess fluid and chest pain.  She reports in the last 3 days she's gained 8 pounds. She felt as though she was dehydrated, was dizzy, so she skipped 2 days of torsemide and drank extra water. She reports worsening lower extremity edema, shortness of breath, PND, orthopnea she's had some chest pain which she describes as a ball  in her chest.  She also feels that her heart is beating out of her chest just about every day for short period of time.  The patient currently denies nausea, vomiting, fever,  PND, cough, congestion, abdominal pain, hematochezia, melena, claudication.  Wt Readings from Last 3 Encounters:  05/25/15 267 lb 1.6 oz (121.156 kg)  04/29/15 264 lb (119.75 kg)  08/24/14 262 lb 4.8 oz (118.978 kg)     Past Medical History  Diagnosis Date  . A-fib ([place])   . Cardiomyopathy   . Sleep apnea 2008    CPAP  . Thyroid disease   . Renal disorder   . Lupus ([place])   . Hypertension   . Pulmonary hypertension ([place]) 05/10/2011    Echo, EF-40-45  . Chest pain 09/21/2011    Nuc,     Current Outpatient Prescriptions  Medication Sig Dispense Refill  . allopurinol (ZYLOPRIM) 100 MG tablet Take 200 mg by mouth daily.     Marland Kitchen atorvastatin (LIPITOR) 40 MG tablet TAKE 1 TABLET (40 MG TOTAL) BY MOUTH DAILY. 90 tablet 2  . carvedilol (COREG CR) 80 MG 24 hr capsule Take 1 capsule (80 mg total) by mouth daily. 90 capsule 3  . clotrimazole (LOTRIMIN) 1 % cream Apply to areas under arms twice daily until healed. 45 g 0  . colchicine 0.6 MG tablet Take 0.6 mg by mouth daily.    Marland Kitchen COREG CR 80 MG 24 hr capsule TAKE ONE CAPSULE BY MOUTH EVERY DAY 90 capsule 2  . doxycycline (VIBRAMYCIN) 100 MG capsule Take 1  capsule (100 mg total) by mouth 2 (two) times daily. Take with food. 20 capsule 0  . estradiol (ESTRACE) 2 MG tablet Take 1 tablet by mouth.    . ferrous sulfate 325 (65 FE) MG tablet Take 325 mg by mouth daily with breakfast.    . hydrALAZINE (APRESOLINE) 25 MG tablet Take 1 tablet (25 mg total) by mouth 2 (two) times daily. 180 tablet 2  . levothyroxine (SYNTHROID, LEVOTHROID) 25 MCG tablet Take 25 mcg by mouth daily.  4  . Multiple Vitamin (MULITIVITAMIN WITH MINERALS) TABS Take 1 tablet by mouth daily.    Marland Kitchen nystatin-triamcinolone (MYCOLOG II) cream Apply 1 application topically.    Marland Kitchen oxyCODONE-acetaminophen  (PERCOCET/ROXICET) 5-325 MG per tablet Take 1 tablet by mouth as needed.    . paricalcitol (ZEMPLAR) 1 MCG capsule Take 1 mcg by mouth daily.    . potassium chloride 20 MEQ/15ML (10%) SOLN Take 15 mLs (20 mEq total) by mouth daily. 900 mL 3  . torsemide (DEMADEX) 20 MG tablet Take 2 tablets (40 mg) by mouth twice daily. 120 tablet 0  . torsemide (DEMADEX) 20 MG tablet TAKE 2 TABLETS (40MG ) BY MOUTH TWICE DAILY 120 tablet 1  . warfarin (COUMADIN) 5 MG tablet Take 1 to 1.5 tablets by mouth daily as directed by coumadin clinic 135 tablet 0  . zolpidem (AMBIEN) 10 MG tablet Take 10 mg by mouth at bedtime as needed for sleep.     Marland Kitchen BELVIQ 10 MG TABS Take 1 tablet by mouth 2 (two) times daily.  2  . celecoxib (CELEBREX) 200 MG capsule Take 200 mg by mouth as needed.  0   No current facility-administered medications for this visit.    Allergies:    Allergies  Allergen Reactions  . Diovan [Valsartan] Other (See Comments)    Messes up kidneys  . Lisinopril Swelling    Facial swelling    Social History:  The patient  reports that she has never smoked. She does not have any smokeless tobacco history on file. She reports that she drinks alcohol. She reports that she does not use illicit drugs.   Family history:   Family History  Problem Relation Age of Onset  . Heart disease Mother   . Heart disease Father     ROS:  Please see the history of present illness.  All other systems reviewed and negative.   PHYSICAL EXAM: VS:  BP 116/80 mmHg  Pulse 66  Ht 5' 3.5" (1.613 m)  Wt 267 lb 1.6 oz (121.156 kg)  BMI 46.57 kg/m2 Obese well developed, in no acute distress HEENT: Pupils are equal round react to light accommodation extraocular movements are intact.  Neck: Elevated JVDNo cervical lymphadenopathy. Cardiac: Regular rate and rhythm without murmurs rubs or gallops. Lungs:  clear to auscultation bilaterally, no wheezing, rhonchi or rales Abd: soft, nontender, positive bowel sounds all  quadrants, no hepatosplenomegaly Ext: Her legs are flat however she does not have any pitting lower extremity edema.  2+ radial and dorsalis pedis pulses. Skin: warm and dry Neuro:  Grossly normal  EKG:  Normal sinus rhythm rate 66 bpm inferior T-wave inversions are old    ASSESSMENT AND PLAN:  Problem List Items Addressed This Visit    Paroxysmal atrial fibrillation (HCC)   Palpitations   Obstructive sleep apnea on CPAP   Nonischemic cardiomyopathy (HCC)   Morbid obesity (HCC)   Relevant Medications   BELVIQ 10 MG TABS   Chest pain, atypical - Primary  Acute on chronic systolic heart failure, NYHA class 2 ([place])      Patient does have elevated JVD without any pitting lower extremity edema.  Clear to auscultation.  Will increase her torsemide 60 mg in the morning 40 at night. We'll check a basic metabolic panel in the [facility]. She has not lost any weight today she'll call me on Friday otherwise she'll follow-up in 2 weeks with extender.  SARS palpitations, sounds as though she may be having some more atrial fibrillation. Will check this with the 48 hour Holter monitor.  She currently is on Coreg 80 mg daily.  Patient's last stress test was 4 years ago was nonischemic. I tell her chest pain currently is angina. Sounds as though it's related to heart failure. She has no acute EKG changes. We'll continue to monitor. Pressures well-controlled. She is using CPAP for sleep apnea.  She also requested help for about losing weight and will refer her to a registered dietitian.

## 2015-05-25 NOTE — Patient Instructions (Signed)
Your physician has recommended you make the following change in your medication:   Increase the torsemide to 60 mg in the morning  and 40 mg in the evening for 4 days then resume regular dose.  Your physician recommends that you return for lab work today  Your physician has recommended that you wear a holter monitor. Holter monitors are medical devices that record the heart's electrical activity. Doctors most often use these monitors to diagnose arrhythmias. Arrhythmias are problems with the speed or rhythm of the heartbeat. The monitor is a small, portable device. You can wear one while you do your normal daily activities. This is usually used to diagnose what is causing palpitations/syncope (passing out).  Your physician recommends that you schedule a follow-up appointment in: 2 weeks with [facility] or another extender.

## 2015-05-26 LAB — BRAIN NATRIURETIC PEPTIDE: BRAIN NATRIURETIC PEPTIDE: 165.4 pg/mL — AB (ref 0.0–100.0)

## 2015-06-08 ENCOUNTER — Encounter: Payer: Managed Care, Other (non HMO) | Admitting: Pharmacist Clinician (PhC)/ Clinical Pharmacy Specialist

## 2015-06-09 ENCOUNTER — Encounter: Payer: Managed Care, Other (non HMO) | Admitting: Pharmacist Clinician (PhC)/ Clinical Pharmacy Specialist

## 2015-06-09 ENCOUNTER — Encounter: Payer: Managed Care, Other (non HMO) | Admitting: Physician Assistant

## 2015-06-09 NOTE — Progress Notes (Signed)
    Cardiology Office Note   Date:  06/09/2015   ID:  Demetrias Cucuzza, DOB 04/27/1955, MRN [id number]  PCP:  [name], MD  Cardiologist:  Dr Meredeth Ide, PA-C   No chief complaint on file.   History of Present Illness: Caitlyn Fuentes is a 60 y.o. female with a history of afib, NICM w/ EF 40%>>56% MV 2013, OSA on CPAP, HTN, CKD, gout, lupus, obesity   This encounter was created in error - please disregard.

## 2015-06-10 ENCOUNTER — Encounter: Payer: Self-pay | Admitting: *Deleted

## 2015-07-19 ENCOUNTER — Other Ambulatory Visit: Payer: Self-pay | Admitting: Cardiovascular Disease

## 2015-08-01 ENCOUNTER — Emergency Department
Admission: EM | Admit: 2015-08-01 | Discharge: 2015-08-01 | Disposition: A | Discharge status: Home / Self Care | Payer: Managed Care, Other (non HMO) | Source: Home / Self Care | Attending: Family Medicine | Admitting: Family Medicine

## 2015-08-01 ENCOUNTER — Encounter: Payer: Self-pay | Admitting: *Deleted

## 2015-08-01 DIAGNOSIS — L03112 Cellulitis of left axilla: Secondary | ICD-10-CM | POA: Diagnosis not present

## 2015-08-01 MED ORDER — OXYCODONE HCL 5 MG PO CAPS: 5.0000 mg | ORAL_CAPSULE | Freq: Four times a day (QID) | ORAL | Status: DC | PRN

## 2015-08-01 MED ORDER — DOXYCYCLINE HYCLATE 100 MG PO CAPS: 100.0000 mg | ORAL_CAPSULE | Freq: Two times a day (BID) | ORAL | Status: DC

## 2015-08-01 NOTE — ED Notes (Signed)
Pt c/o multiple boils to left breast into left axilla x 3-4 weeks. Site in left axilla is draining. She was seen here for same issue previously and is using antibiotic ointment that was previously prescribed. Also c/o some nausea and aches.

## 2015-08-01 NOTE — Discharge Instructions (Signed)
Apply warm pack or heating pad 3 to 4 times daily.  Keep wound bandaged until healed, and change daily.   Cellulitis Cellulitis is an infection of the skin and the tissue under the skin. The infected area is usually red and tender. This happens most often in the arms and lower legs. HOME CARE   Take your antibiotic medicine as told. Finish the medicine even if you start to feel better.  Keep the infected arm or leg raised (elevated).  Put a warm cloth on the area up to 4 times per day.  Only take medicines as told by your doctor.  Keep all doctor visits as told. GET HELP IF:  You see red streaks on the skin coming from the infected area.  Your red area gets bigger or turns a dark color.  Your bone or joint under the infected area is painful after the skin heals.  Your infection comes back in the same area or different area.  You have a puffy (swollen) bump in the infected area.  You have new symptoms.  You have a fever. GET HELP RIGHT AWAY IF:   You feel very sleepy.  You throw up (vomit) or have watery poop (diarrhea).  You feel sick and have muscle aches and pains.   This information is not intended to replace advice given to you by your health care provider. Make sure you discuss any questions you have with your health care provider.   Document Released: 09/26/2007 Document Revised: 12/29/2014 Document Reviewed: 06/25/2011 Elsevier Interactive Patient Education [employer].

## 2015-08-01 NOTE — ED Provider Notes (Signed)
CSN: [id number]     Arrival date & time 08/01/15  1339 History   First MD Initiated Contact with Patient 08/01/15 1558     Chief Complaint  Patient presents with  . Recurrent Skin Infections     HPI Comments: Patient complains of recurring "boils."  She has had several lesions appear on her left breast and left axilla during the past 3 to 4 weeks that resolve spontaneously, leaving a hyper-pigmented area.  She presently has a tender lesion in her left axilla that has been weeping small amounts of fluid.  She feels well otherwise.  No fevers, chills, and sweats.  The history is provided by the patient.    Past Medical History  Diagnosis Date  . A-fib ([place])   . Cardiomyopathy   . Sleep apnea 2008    CPAP  . Thyroid disease   . Renal disorder   . Lupus ([facility])   . Hypertension   . Pulmonary hypertension ([place]) 05/10/2011    Echo, EF-40-45  . Chest pain 09/21/2011    Nuc,    Past Surgical History  Procedure Laterality Date  . Cardiac catheterization      Nonischemic, EF-40, continued medical therapy  . Cardiac catheterization  10/02/2006    No significant CAD   Family History  Problem Relation Age of Onset  . Heart disease Mother   . Heart disease Father    Social History  Substance Use Topics  . Smoking status: Never Smoker   . Smokeless tobacco: None  . Alcohol Use: 0.0 oz/week    0 Standard drinks or equivalent per week   OB History    No data available     Review of Systems  Constitutional: Negative for chills, diaphoresis, activity change and fatigue.  HENT: Negative.   Eyes: Negative.   Respiratory: Negative.   Cardiovascular: Negative.   Gastrointestinal: Negative.   Endocrine: Negative.   Genitourinary: Negative.   Musculoskeletal: Negative.   Skin: Positive for color change, rash and wound.  Neurological: Negative for headaches.    Allergies  Diovan and Lisinopril  Home Medications   Prior to Admission medications   Medication Sig Start Date End  Date Taking? Authorizing Provider  allopurinol (ZYLOPRIM) 100 MG tablet Take 200 mg by mouth daily.  07/09/13   Historical Provider, MD  atorvastatin (LIPITOR) 40 MG tablet TAKE 1 TABLET (40 MG TOTAL) BY MOUTH DAILY. 03/30/15   Troy Sine, MD  BELVIQ 10 MG TABS Take 1 tablet by mouth 2 (two) times daily. 03/28/15   Historical Provider, MD  carvedilol (COREG CR) 80 MG 24 hr capsule Take 1 capsule (80 mg total) by mouth daily. 09/21/14   Troy Sine, MD  celecoxib (CELEBREX) 200 MG capsule Take 200 mg by mouth as needed. 03/16/15   Historical Provider, MD  clotrimazole (LOTRIMIN) 1 % cream Apply to areas under arms twice daily until healed. 04/29/15   Kandra Nicolas, MD  colchicine 0.6 MG tablet Take 0.6 mg by mouth daily.    Historical Provider, MD  COREG CR 80 MG 24 hr capsule TAKE ONE CAPSULE BY MOUTH EVERY DAY 09/21/14   Troy Sine, MD  doxycycline (VIBRAMYCIN) 100 MG capsule Take 1 capsule (100 mg total) by mouth 2 (two) times daily. Take with food. 08/01/15   Kandra Nicolas, MD  estradiol (ESTRACE) 2 MG tablet Take 1 tablet by mouth. 03/22/14   Historical Provider, MD  ferrous sulfate 325 (65 FE) MG tablet Take 325 mg  by mouth daily with breakfast.    Historical Provider, MD  hydrALAZINE (APRESOLINE) 25 MG tablet Take 1 tablet (25 mg total) by mouth 2 (two) times daily. 01/24/15   Troy Sine, MD  levothyroxine (SYNTHROID, LEVOTHROID) 25 MCG tablet Take 25 mcg by mouth daily. 07/14/14   Historical Provider, MD  Multiple Vitamin (MULITIVITAMIN WITH MINERALS) TABS Take 1 tablet by mouth daily.    Historical Provider, MD  nystatin-triamcinolone (MYCOLOG II) cream Apply 1 application topically. 03/22/14   Historical Provider, MD  paricalcitol (ZEMPLAR) 1 MCG capsule Take 1 mcg by mouth daily.    Historical Provider, MD  potassium chloride 20 MEQ/15ML (10%) SOLN Take 15 mLs (20 mEq total) by mouth daily. 02/25/14   Troy Sine, MD  torsemide (DEMADEX) 20 MG tablet Take 2 tablets (40 mg) by  mouth twice daily. 05/12/15   Troy Sine, MD  torsemide (DEMADEX) 20 MG tablet TAKE 2 TABLETS (40MG ) BY MOUTH TWICE DAILY 05/13/15   Troy Sine, MD  warfarin (COUMADIN) 5 MG tablet Take 1-1.5 tablets by mouth daily as directed by coumadin clinic.  Need INR appt for further refills 07/19/15   Troy Sine, MD  zolpidem (AMBIEN) 10 MG tablet Take 10 mg by mouth at bedtime as needed for sleep.     Historical Provider, MD   Meds Ordered and Administered this Visit  Medications - No data to display  BP 133/87 mmHg  Pulse 70  Temp(Src) 97.7 F (36.5 C) (Oral)  Wt 263 lb (119.296 kg)  SpO2 97% No data found.   Physical Exam  Constitutional: She is oriented to person, place, and time. She appears well-developed and well-nourished. No distress.  Patient is obese.  HENT:  Head: Normocephalic.  Mouth/Throat: Oropharynx is clear and moist.  Eyes: Conjunctivae and EOM are normal. Pupils are equal, round, and reactive to light.  Neck: Normal range of motion. Neck supple.  Cardiovascular: Normal heart sounds.   Pulmonary/Chest: Breath sounds normal.  Abdominal: There is no tenderness.  Lymphadenopathy:    She has no cervical adenopathy.  Neurological: She is alert and oriented to person, place, and time.  Skin: Skin is warm and dry.  Left upper chest and outer breast have several small 1cm diameter areas of hyperpigmentation.  Left axilla has a shallow moist excoriation about 35mm by 35mm with surrounding erythema/tenderness, but no induration or fluctuance.  Nursing note and vitals reviewed.   ED Course  Procedures None    Labs Reviewed  HEMOGLOBIN A1C      MDM   1. Cellulitis of axilla, left    Concern for Type 2 diabetes decreased control:  Check Hgb A1c (follow-up with PCP if elevated). Wound culture pending.  Begin doxycycline 100mg  BID Apply warm pack or heating pad 3 to 4 times daily.  Keep wound bandaged until healed, and change daily. Followup with Family Doctor if  not improved in one week.     Kandra Nicolas, MD 08/07/15 [phone]

## 2015-08-02 LAB — HEMOGLOBIN A1C
HEMOGLOBIN A1C: 5.3 % (ref <5.7)
MEAN PLASMA GLUCOSE: 105 mg/dL

## 2015-08-04 ENCOUNTER — Telehealth: Payer: Self-pay | Admitting: Emergency Medicine

## 2015-08-04 LAB — WOUND CULTURE
Gram Stain: NONE SEEN
Gram Stain: NONE SEEN

## 2015-08-07 ENCOUNTER — Other Ambulatory Visit: Payer: Self-pay | Admitting: Cardiovascular Disease

## 2015-08-08 NOTE — Telephone Encounter (Signed)
Rx(s) sent to pharmacy electronically.  

## 2015-08-15 ENCOUNTER — Other Ambulatory Visit: Payer: Self-pay | Admitting: Cardiovascular Disease

## 2015-08-16 ENCOUNTER — Telehealth: Payer: Self-pay | Admitting: Cardiovascular Disease

## 2015-08-18 NOTE — Telephone Encounter (Signed)
Closed encounter °

## 2015-08-23 ENCOUNTER — Ambulatory Visit (INDEPENDENT_AMBULATORY_CARE_PROVIDER_SITE_OTHER): Payer: Managed Care, Other (non HMO) | Admitting: Pharmacist Clinician (PhC)/ Clinical Pharmacy Specialist

## 2015-08-23 DIAGNOSIS — I480 Paroxysmal atrial fibrillation: Secondary | ICD-10-CM | POA: Diagnosis not present

## 2015-08-23 DIAGNOSIS — Z7901 Long term (current) use of anticoagulants: Secondary | ICD-10-CM | POA: Diagnosis not present

## 2015-08-23 LAB — POCT INR: INR: 4.1

## 2015-09-02 ENCOUNTER — Other Ambulatory Visit: Payer: Self-pay | Admitting: Cardiovascular Disease

## 2015-09-05 ENCOUNTER — Other Ambulatory Visit: Payer: Self-pay | Admitting: Cardiovascular Disease

## 2015-09-05 NOTE — Telephone Encounter (Signed)
Rx request sent to pharmacy.  

## 2015-09-07 ENCOUNTER — Ambulatory Visit (INDEPENDENT_AMBULATORY_CARE_PROVIDER_SITE_OTHER): Payer: Managed Care, Other (non HMO) | Admitting: Pharmacist Clinician (PhC)/ Clinical Pharmacy Specialist

## 2015-09-07 DIAGNOSIS — I480 Paroxysmal atrial fibrillation: Secondary | ICD-10-CM

## 2015-09-07 DIAGNOSIS — Z7901 Long term (current) use of anticoagulants: Secondary | ICD-10-CM | POA: Diagnosis not present

## 2015-09-07 LAB — POCT INR: INR: 2.4

## 2015-09-08 ENCOUNTER — Other Ambulatory Visit: Payer: Self-pay | Admitting: Pharmacist Clinician (PhC)/ Clinical Pharmacy Specialist

## 2015-09-22 ENCOUNTER — Ambulatory Visit (INDEPENDENT_AMBULATORY_CARE_PROVIDER_SITE_OTHER): Payer: Managed Care, Other (non HMO) | Admitting: Physician Assistant

## 2015-09-22 ENCOUNTER — Encounter: Payer: Self-pay | Admitting: Physician Assistant

## 2015-09-22 ENCOUNTER — Ambulatory Visit (INDEPENDENT_AMBULATORY_CARE_PROVIDER_SITE_OTHER): Payer: Managed Care, Other (non HMO) | Admitting: Pharmacist Clinician (PhC)/ Clinical Pharmacy Specialist

## 2015-09-22 VITALS — BP 120/64 | HR 68 | Ht 63.5 in | Wt 268.0 lb

## 2015-09-22 DIAGNOSIS — I480 Paroxysmal atrial fibrillation: Secondary | ICD-10-CM

## 2015-09-22 DIAGNOSIS — I5022 Chronic systolic (congestive) heart failure: Secondary | ICD-10-CM | POA: Diagnosis not present

## 2015-09-22 DIAGNOSIS — Z7901 Long term (current) use of anticoagulants: Secondary | ICD-10-CM

## 2015-09-22 LAB — POCT INR: INR: 4.2

## 2015-09-22 NOTE — Progress Notes (Signed)
Cardiology Office Note   Date:  09/22/2015   ID:  Caitlyn Fuentes, DOB [birth year], MRN [id number]  PCP:  [name], MD  Cardiologist:  Dr Meredeth Ide, PA-C   No chief complaint on file.   History of Present Illness: Caitlyn Fuentes is a 60 y.o. female with a history of NICM (EF 40-45% 2013), PAF on coumadin, OSA on CPAP, HTN, CKD, LE edema, gout, lupus, and deep venous insufficiency within the L common femoral vein and R/L greater saphenous veins with valvular insufficiency, as well as the L short saphenous vein. Angiomyolipoma of the R kidney.  Seen in office 05/25/2015 with increased DOE, chest pain and weight gain, torsemide increased  Caitlyn Fuentes presents for evaluation of dyspnea on exertion in shortness of breath. She admits that she does not watch closely the sodium in her diet. She is drinking half liter bottles of water, 6-8 per day. She also drinks some green tea. She eats out at times. She likes the rotisserie chicken made by the various grocery stores. She was not aware that this had a great deal of salt in it. Her favorite pieces are the wings and the legs. She eats the skin. She is not weighing herself every day.   Past Medical History  Diagnosis Date  . A-fib ([place])   . Cardiomyopathy   . Sleep apnea 2008    CPAP  . Thyroid disease   . Renal disorder   . Lupus ([place])   . Hypertension   . Pulmonary hypertension ([place]) 05/10/2011    Echo, EF-40-45  . Chest pain 09/21/2011    Nuc,     Past Surgical History  Procedure Laterality Date  . Cardiac catheterization      Nonischemic, EF-40, continued medical therapy  . Cardiac catheterization  10/02/2006    No significant CAD    Current Outpatient Prescriptions  Medication Sig Dispense Refill  . allopurinol (ZYLOPRIM) 100 MG tablet Take 200 mg by mouth daily.     Marland Kitchen atorvastatin (LIPITOR) 40 MG tablet TAKE 1 TABLET (40 MG TOTAL) BY MOUTH DAILY. 90 tablet 2  . BELVIQ 10 MG TABS Take 1 tablet by  mouth 2 (two) times daily.  2  . celecoxib (CELEBREX) 200 MG capsule Take 200 mg by mouth as needed.  0  . colchicine 0.6 MG tablet Take 0.6 mg by mouth daily.    Marland Kitchen COREG CR 80 MG 24 hr capsule TAKE ONE CAPSULE BY MOUTH EVERY DAY 90 capsule 2  . estradiol (ESTRACE) 2 MG tablet Take 1 tablet by mouth.    . ferrous sulfate 325 (65 FE) MG tablet Take 325 mg by mouth daily with breakfast.    . hydrALAZINE (APRESOLINE) 25 MG tablet Take 1 tablet (25 mg total) by mouth 2 (two) times daily. 180 tablet 2  . levothyroxine (SYNTHROID, LEVOTHROID) 25 MCG tablet Take 25 mcg by mouth daily.  4  . Multiple Vitamin (MULITIVITAMIN WITH MINERALS) TABS Take 1 tablet by mouth daily.    Marland Kitchen torsemide (DEMADEX) 20 MG tablet Take 1 tablet (20 mg total) by mouth 2 (two) times daily. 120 tablet 0  . warfarin (COUMADIN) 5 MG tablet Take 1-1.5 tablets by mouth daily as directed by coumadin clinic 40 tablet 1  . zolpidem (AMBIEN) 10 MG tablet Take 10 mg by mouth at bedtime as needed for sleep.      No current facility-administered medications for this visit.    Allergies:   Diovan and Lisinopril  Social History:  The patient  reports that she has never smoked. She does not have any smokeless tobacco history on file. She reports that she drinks alcohol. She reports that she does not use illicit drugs.   Family History:  The patient's family history includes Heart disease in her father and mother.    ROS:  Please see the history of present illness. All other systems are reviewed and negative.    PHYSICAL EXAM: VS:  BP 120/64 mmHg  Pulse 68  Ht 5' 3.5" (1.613 m)  Wt 268 lb (121.564 kg)  BMI 46.72 kg/m2 , BMI Body mass index is 46.72 kg/(m^2). GEN: Well nourished, well developed, female in no acute distress HEENT: normal for age  Neck: JVD 9 cm, positive hepatojugular reflux, no carotid bruit, no masses Cardiac: RRR; no murmur, no rubs, or gallops Respiratory: Decreased breath sounds bases bilaterally, normal  work of breathing GI: soft, nontender, nondistended, + BS MS: no deformity or atrophy;1+  edema; distal pulses are 2+ in all 4 extremities  Skin: warm and dry, no rash Neuro:  Strength and sensation are intact Psych: euthymic mood, full affect   EKG:  EKG is ordered today. The ekg ordered today demonstrates sinus rhythm with first-degree A-V block, possible LVH   Recent Labs: 05/25/2015: Brain Natriuretic Peptide 165.4*; Hemoglobin 10.8*; Platelets 360   Lipid Panel    Component Value Date/Time   CHOL 207* 03/26/2014 1035   TRIG 256* 03/26/2014 1035   HDL 39* 03/26/2014 1035   CHOLHDL 5.3 03/26/2014 1035   VLDL 51* 03/26/2014 1035   LDLCALC 117* 03/26/2014 1035     Wt Readings from Last 3 Encounters:  09/22/15 268 lb (121.564 kg)  08/01/15 263 lb (119.296 kg)  05/25/15 267 lb 1.6 oz (121.156 kg)     Other studies Reviewed: Additional studies/ records that were reviewed today include: Previous office notes and testing   ASSESSMENT AND PLAN:  1.  Chronic systolic CHF: Compliance with sodium limitations was emphasized. She eats quite a bit of sodium and has not been monitoring this carefully. She is encouraged to do so. She is also over drinking. She is encouraged to limit herself to 4 bottles of water daily. She states the torsemide makes her thirsty and she is encouraged to use mints or ice to keep her mouth moist. When she is diet compliant and sodium compliant, her diuretic dose may be enough. If it is not, then he can be changed.   2. Morbid obesity: Her BMI is 46. She is encouraged to limit her portion sizes and increase her activity. A nutritionist will help her to find foods that she likes that are easy to prepare.   Current medicines are reviewed at length with the patient today.  The patient does not have concerns regarding medicines.  The following changes have been made:  no change  Labs/ tests ordered today include:   Orders Placed This Encounter  Procedures    . Ambulatory referral to Nutrition and Diabetic Education  . EKG 12-Lead     Disposition:   FU with Dr. Claiborne Billings  Signed, Rosaria Ferries, PA-C  09/22/2015 4:47 PM    [place] Phone: [phone]; Fax: [phone]  This note was written with the assistance of speech recognition software. Please excuse any transcriptional errors.

## 2015-09-22 NOTE — Patient Instructions (Signed)
Rhonda Barrett, PA-C, recommends that you continue on your current medications as directed. Please refer to the Current Medication list given to you today.  You have been referred to a nutritionist.   Suanne Marker recommends that you schedule a follow-up appointment in 1 month with Dr Claiborne Billings.  If you need a refill on your cardiac medications before your next appointment, please call your pharmacy.  Your physician recommends that you weigh, daily, at the same time every day, and in the same amount of clothing. Please record your daily weights on the handout provided and bring it to your next appointment.  Please limit your fluid intake to 1.5 liters daily.  Low-Sodium Eating Plan Sodium raises blood pressure and causes water to be held in the body. Getting less sodium from food will help lower your blood pressure, reduce any swelling, and protect your heart, liver, and kidneys. We get sodium by adding salt (sodium chloride) to food. Most of our sodium comes from canned, boxed, and frozen foods. Restaurant foods, fast foods, and pizza are also very high in sodium. Even if you take medicine to lower your blood pressure or to reduce fluid in your body, getting less sodium from your food is important. WHAT IS MY PLAN? Most people should limit their sodium intake to 2,300 mg a day. Your health care provider recommends that you limit your sodium intake to 2,000 mg daily.  WHAT DO I NEED TO KNOW ABOUT THIS EATING PLAN? For the low-sodium eating plan, you will follow these general guidelines:  Choose foods with a % Daily Value for sodium of less than 5% (as listed on the food label).   Use salt-free seasonings or herbs instead of table salt or sea salt.   Check with your health care provider or pharmacist before using salt substitutes.   Eat fresh foods.  Eat more vegetables and fruits.  Limit canned vegetables. If you do use them, rinse them well to decrease the sodium.   Limit cheese to 1 oz (28  g) per day.   Eat lower-sodium products, often labeled as "lower sodium" or "no salt added."  Avoid foods that contain monosodium glutamate (MSG). MSG is sometimes added to [place] food and some canned foods.  Check food labels (Nutrition Facts labels) on foods to learn how much sodium is in one serving.  Eat more home-cooked food and less restaurant, buffet, and fast food.  When eating at a restaurant, ask that your food be prepared with less salt, or no salt if possible.  HOW DO I READ FOOD LABELS FOR SODIUM INFORMATION? The Nutrition Facts label lists the amount of sodium in one serving of the food. If you eat more than one serving, you must multiply the listed amount of sodium by the number of servings. Food labels may also identify foods as:  Sodium free--Less than 5 mg in a serving.  Very low sodium--35 mg or less in a serving.  Low sodium--140 mg or less in a serving.  Light in sodium--50% less sodium in a serving. For example, if a food that usually has 300 mg of sodium is changed to become light in sodium, it will have 150 mg of sodium.  Reduced sodium--25% less sodium in a serving. For example, if a food that usually has 400 mg of sodium is changed to reduced sodium, it will have 300 mg of sodium. WHAT FOODS CAN I EAT? Grains Low-sodium cereals, including oats, puffed wheat and rice, and shredded wheat cereals. Low-sodium crackers. Unsalted  rice and pasta. Lower-sodium bread.  Vegetables Frozen or fresh vegetables. Low-sodium or reduced-sodium canned vegetables. Low-sodium or reduced-sodium tomato sauce and paste. Low-sodium or reduced-sodium tomato and vegetable juices.  Fruits Fresh, frozen, and canned fruit. Fruit juice.  Meat and Other Protein Products Low-sodium canned tuna and salmon. Fresh or frozen meat, poultry, seafood, and fish. Lamb. Unsalted nuts. Dried beans, peas, and lentils without added salt. Unsalted canned beans. Homemade soups without salt.  Eggs.  Dairy Milk. Soy milk. Ricotta cheese. Low-sodium or reduced-sodium cheeses. Yogurt.  Condiments Fresh and dried herbs and spices. Salt-free seasonings. Onion and garlic powders. Low-sodium varieties of mustard and ketchup. Fresh or refrigerated horseradish. Lemon juice.  Fats and Oils Reduced-sodium salad dressings. Unsalted butter.  Other Unsalted popcorn and pretzels.  The items listed above may not be a complete list of recommended foods or beverages. Contact your dietitian for more options. WHAT FOODS ARE NOT RECOMMENDED? Grains Instant hot cereals. Bread stuffing, pancake, and biscuit mixes. Croutons. Seasoned rice or pasta mixes. Noodle soup cups. Boxed or frozen macaroni and cheese. Self-rising flour. Regular salted crackers. Vegetables Regular canned vegetables. Regular canned tomato sauce and paste. Regular tomato and vegetable juices. Frozen vegetables in sauces. Salted [place] fries. Olives. Angie Fava. Relishes. Sauerkraut. Salsa. Meat and Other Protein Products Salted, canned, smoked, spiced, or pickled meats, seafood, or fish. Bacon, ham, sausage, hot dogs, corned beef, chipped beef, and packaged luncheon meats. Salt pork. Jerky. Pickled herring. Anchovies, regular canned tuna, and sardines. Salted nuts. Dairy Processed cheese and cheese spreads. Cheese curds. Blue cheese and cottage cheese. Buttermilk.  Condiments Onion and garlic salt, seasoned salt, table salt, and sea salt. Canned and packaged gravies. Worcestershire sauce. Tartar sauce. Barbecue sauce. Teriyaki sauce. Soy sauce, including reduced sodium. Steak sauce. Fish sauce. Oyster sauce. Cocktail sauce. Horseradish that you find on the shelf. Regular ketchup and mustard. Meat flavorings and tenderizers. Bouillon cubes. Hot sauce. Tabasco sauce. Marinades. Taco seasonings. Relishes. Fats and Oils Regular salad dressings. Salted butter. Margarine. Ghee. Bacon fat.  Other Potato and tortilla chips. Corn  chips and puffs. Salted popcorn and pretzels. Canned or dried soups. Pizza. Frozen entrees and pot pies.  The items listed above may not be a complete list of foods and beverages to avoid. Contact your dietitian for more information.   This information is not intended to replace advice given to you by your health care provider. Make sure you discuss any questions you have with your health care provider.   Document Released: 09/29/2001 Document Revised: 04/30/2014 Document Reviewed: 02/11/2013 Elsevier Interactive Patient Education [employer].

## 2015-10-12 ENCOUNTER — Other Ambulatory Visit: Payer: Self-pay | Admitting: Cardiovascular Disease

## 2015-10-13 ENCOUNTER — Other Ambulatory Visit: Payer: Self-pay | Admitting: Cardiovascular Disease

## 2015-10-13 NOTE — Telephone Encounter (Signed)
Rx(s) sent to pharmacy electronically.  

## 2015-10-17 ENCOUNTER — Encounter: Payer: Managed Care, Other (non HMO) | Attending: Cardiovascular Disease | Admitting: Dietician

## 2015-10-17 ENCOUNTER — Encounter: Payer: Self-pay | Admitting: Dietician

## 2015-10-17 DIAGNOSIS — I429 Cardiomyopathy, unspecified: Secondary | ICD-10-CM

## 2015-10-17 DIAGNOSIS — I5022 Chronic systolic (congestive) heart failure: Secondary | ICD-10-CM | POA: Diagnosis not present

## 2015-10-17 NOTE — Patient Instructions (Addendum)
Be as active as possible.  Aim for some form of activity most days.  Walking or other things that you enjoy. Be careful about your sodium intake.  Avoid processed meat.  When eating out get food prepared to order without added salt. Have regularly scheduled meals. Choose your food wisely, eat slowly and stop when you are satisfied. Eat at the table away from the TV and other electronics.   Increase your vegetable intake. Rethink what you drink.

## 2015-10-17 NOTE — Progress Notes (Signed)
Medical Nutrition Therapy:  Appt start time: 0920  end time:  0935.   Assessment:  Primary concerns today: Patient is here alone to try to eat healthier and lose weight.  She states that she has been on Belviq for the past year and noticed a decreased appetite. Hx includes OSA on c-pap, HTN, Pulmonary HTN, Lupus, Cardiomyopathy, CKD.  She is on Coumadin as well.  She states that she is to restrict her intake to 1500 ml daily but she has not been able to do this.  She does not follow a low sodium diet but does not use added salt.  Patient lives with her husband.  Both shop and cook.  Husband states that he is supportive about healthy eating but he gets big bags of chips and eats them in the bed.  He states, "you just have to have the willpower".  He also buys increased amounts of chips and cookies.  When we discussed meal prep, "I'm retired.  She has her jobs and I have mine."  She works second shift at [employer] from 2:30-11:30.  Her son has recently sent [employer] meals.  Weight hx: Lowest adult weight:  107 lbs at 60 yo Highest adult weight 270 lbs 4 months ago ("fluid") Today's weight 262 lbs. Goal:  140 lbs  Preferred Learning Style:   No preference indicated   Learning Readiness:   Ready  Change in progress  MEDICATIONS: see list to include Balviq, synthroid, MVI, Omega-3, iron, and coumadin.   DIETARY INTAKE:  Usual eating pattern includes 3 meals and 1 snacks per day. Everyday foods include fast foods, and vending machine food.  Avoided foods include added salt.    24-hr recall:  B (12 PM): 3-4 strips bacon, 2 boiled eggs, 2 pieces of cheese toast, OJ at times OR OJ and Egg McMuffin from [employer] Snk ( AM): none L (7 PM): something from the vending machine (NABS), cappuccino Milkshake from [employer] Snk ( PM): none D (11 PM): cappuccino Milkshake from [employer] if she has not had earlier and grilled chicken sandwich and/or 2 bags of chips and regular Dr. Malachi Bonds Snk (  PM): chips Beverages: coffee with 2 sugar and coffeemate, regular Dr. Malachi Bonds, flavored Seltzer water but dislikes, sweet tea, water,   Usual physical activity: walked for 5 weeks straight for 22 minutes per day but stopped with increased rain.  Estimated energy needs: 1500 calories 170 g carbohydrates 112 g protein 42 g fat  Progress Towards Goal(s):  In progress.   Nutritional Diagnosis:  NB-1.1 Food and nutrition-related knowledge deficit As related to healthy eating choices for weight managment.  As evidenced by diet hx.    Intervention:  Nutrition counseling/education related to normalizing of eating behavior and mindful eating for weight loss.  Be as active as possible.  Aim for some form of activity most days.  Walking or other things that you enjoy. Be careful about your sodium intake.  Avoid processed meat.  When eating out get food prepared to order without added salt. Have regularly scheduled meals. Choose your food wisely, eat slowly and stop when you are satisfied. Eat at the table away from the TV and other electronics.   Increase your vegetable intake. Rethink what you drink.  Teaching Method Utilized:  Visual Auditory Hands on  Handouts given during visit include:  Meal plan card  My plate  Snack list  Label reading  Low Sodium Nutrition Therapy from AND  Barriers to learning/adherence to lifestyle change:  varying support from husband  Demonstrated degree of understanding via:  Teach Back   Monitoring/Evaluation:  Dietary intake, exercise, label reading, and body weight in 1 month(s).

## 2015-10-27 ENCOUNTER — Other Ambulatory Visit: Payer: Self-pay | Admitting: Gastroenterology

## 2015-10-31 ENCOUNTER — Other Ambulatory Visit: Payer: Self-pay | Admitting: *Deleted

## 2015-10-31 MED ORDER — TORSEMIDE 20 MG PO TABS
20.0000 mg | ORAL_TABLET | Freq: Two times a day (BID) | ORAL | Status: DC
Start: 2015-10-31 — End: 2016-03-31

## 2015-11-01 ENCOUNTER — Other Ambulatory Visit: Payer: Self-pay

## 2015-11-01 MED ORDER — WARFARIN SODIUM 5 MG PO TABS: ORAL_TABLET | ORAL | Status: DC

## 2015-11-04 ENCOUNTER — Encounter: Payer: Self-pay | Admitting: Cardiovascular Disease

## 2015-11-04 ENCOUNTER — Ambulatory Visit (INDEPENDENT_AMBULATORY_CARE_PROVIDER_SITE_OTHER): Payer: Managed Care, Other (non HMO) | Admitting: Pharmacist Clinician (PhC)/ Clinical Pharmacy Specialist

## 2015-11-04 ENCOUNTER — Ambulatory Visit (INDEPENDENT_AMBULATORY_CARE_PROVIDER_SITE_OTHER): Payer: Managed Care, Other (non HMO) | Admitting: Cardiovascular Disease

## 2015-11-04 VITALS — BP 135/75 | HR 67 | Ht 63.5 in | Wt 263.2 lb

## 2015-11-04 DIAGNOSIS — Z7901 Long term (current) use of anticoagulants: Secondary | ICD-10-CM

## 2015-11-04 DIAGNOSIS — I480 Paroxysmal atrial fibrillation: Secondary | ICD-10-CM | POA: Diagnosis not present

## 2015-11-04 DIAGNOSIS — E785 Hyperlipidemia, unspecified: Secondary | ICD-10-CM

## 2015-11-04 DIAGNOSIS — R600 Localized edema: Secondary | ICD-10-CM

## 2015-11-04 DIAGNOSIS — G4733 Obstructive sleep apnea (adult) (pediatric): Secondary | ICD-10-CM | POA: Diagnosis not present

## 2015-11-04 DIAGNOSIS — I429 Cardiomyopathy, unspecified: Secondary | ICD-10-CM | POA: Diagnosis not present

## 2015-11-04 DIAGNOSIS — Z9989 Dependence on other enabling machines and devices: Secondary | ICD-10-CM

## 2015-11-04 DIAGNOSIS — I428 Other cardiomyopathies: Secondary | ICD-10-CM

## 2015-11-04 LAB — POCT INR: INR: 4.9

## 2015-11-04 NOTE — Patient Instructions (Signed)
Your physician wants you to follow-up in: 6 MONTHS OR SOONER IF NEEDED. You will receive a reminder letter in the mail two months in advance. If you don't receive a letter, please call our office to schedule the follow-up appointment.   If you need a refill on your cardiac medications before your next appointment, please call your pharmacy.   

## 2015-11-06 ENCOUNTER — Encounter: Payer: Self-pay | Admitting: Cardiovascular Disease

## 2015-11-06 NOTE — Progress Notes (Signed)
Patient ID: Caitlyn Fuentes, female   DOB: 08/29/1955, 60 y.o.   MRN: [id number]     HPI: Caitlyn Fuentes is a 60 y.o. female who presents to the office today for a one-year follow up cardiology evaluation.  Ms Mcclurkin  initially presented with atrial fibrillation in 2008 was found to have a nonischemic cardiomyopathy. Cardiac catheterization did not show significant coronary obstructive disease and she was found to have mild pulmonary hypertension.   She has obstructive sleep apnea documented since 2008 and has been using CPAP.  She had undergone a follow-up sleep study in 2013, but never follow-up with getting a new CPAP machine.  She uses [employer] for her DME company.   Additional problems include with obesity, hypertension, mild renal insufficiency, as well as lower extremity edema.  There also is a history of lupus as well as gout.  She has lower extremity venous reflux disease with deep venous insufficiency within the left common femoral vein and right and left greater saphenous veins have demonstrated valvular insufficiency, as well as the left short saphenous vein.    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  Since I last saw her, she has been seen by Rosaria Ferries.  She has been maintaining sinus rhythm without recurrent AF.  In used to have lower extremity edema.  She also has had iron deficiency and is followed by Dr.  Collene Mares.  She has been on supplemental iron.  She also has been scheduled to have EGD and colonoscopy on 11/22/2015 and will be needing to hold her Coumadin.  She has a history of hyperlipidemia for which she is on atorvastatin.  She has been taking levothyroxine at low-dose 25 g.  She continues to have leg swelling on torsemide 20 mg twice a day.    Past Medical History  Diagnosis Date  . A-fib ([place])   . Cardiomyopathy   . Sleep apnea 2008    CPAP  . Thyroid  disease   . Renal disorder   . Lupus ([place])   . Hypertension   . Pulmonary hypertension ([place]) 05/10/2011    Echo, EF-[id number]  . Chest pain 09/21/2011    Nuc,     Past Surgical History  Procedure Laterality Date  . Cardiac catheterization      Nonischemic, EF-40, continued medical therapy  . Cardiac catheterization  10/02/2006    No significant CAD    Allergies  Allergen Reactions  . Diovan [Valsartan] Other (See Comments)    Messes up kidneys  . Lisinopril Swelling    Facial swelling    Current Outpatient Prescriptions  Medication Sig Dispense Refill  . allopurinol (ZYLOPRIM) 100 MG tablet Take 200 mg by mouth daily.     Marland Kitchen atorvastatin (LIPITOR) 40 MG tablet TAKE 1 TABLET (40 MG TOTAL) BY MOUTH DAILY. 90 tablet 2  . BELVIQ 10 MG TABS Take 1 tablet by mouth 2 (two) times daily.  2  . celecoxib (CELEBREX) 200 MG capsule Take 200 mg by mouth as needed.  0  . colchicine 0.6 MG tablet Take 0.6 mg by mouth daily.    Marland Kitchen COREG CR 80 MG 24 hr capsule TAKE ONE CAPSULE BY MOUTH EVERY DAY 90 capsule 2  . COREG CR 80 MG 24 hr capsule TAKE 1 CAPSULE (80 MG TOTAL) BY MOUTH DAILY. 90 capsule 3  . estradiol (ESTRACE) 2 MG tablet Take 1 tablet by mouth.    Marland Kitchen  ferrous sulfate 325 (65 FE) MG tablet Take 325 mg by mouth daily with breakfast.    . hydrALAZINE (APRESOLINE) 25 MG tablet TAKE 1 TABLET (25 MG TOTAL) BY MOUTH 2 (TWO) TIMES DAILY. 180 tablet 2  . levothyroxine (SYNTHROID, LEVOTHROID) 25 MCG tablet Take 25 mcg by mouth daily.  4  . Multiple Vitamin (MULITIVITAMIN WITH MINERALS) TABS Take 1 tablet by mouth daily.    Marland Kitchen omega-3 acid ethyl esters (LOVAZA) 1 g capsule Take 1 g by mouth daily.    Marland Kitchen torsemide (DEMADEX) 20 MG tablet Take 1 tablet (20 mg total) by mouth 2 (two) times daily. 180 tablet 1  . warfarin (COUMADIN) 5 MG tablet Take 1-1.5 tablets by mouth daily as directed by coumadin clinic 40 tablet 1  . zolpidem (AMBIEN) 10 MG tablet Take 10 mg by mouth at bedtime as needed for sleep.       No current facility-administered medications for this visit.    Social History   Social History  . Marital Status: Married    Spouse Name: N/A  . Number of Children: N/A  . Years of Education: N/A   Occupational History  . Not on file.   Social History Main Topics  . Smoking status: Never Smoker   . Smokeless tobacco: Not on file  . Alcohol Use: 0.0 oz/week    0 Standard drinks or equivalent per week  . Drug Use: No  . Sexual Activity: Not on file   Other Topics Concern  . Not on file   Social History Narrative   Socially, she is married and has 4 children, one grandchild.  There is no tobacco or alcohol use.  She does not routinely exercise.  Family History  Problem Relation Age of Onset  . Heart disease Mother   . Heart disease Father     ROS General: Negative; No fevers, chills, or night sweats HEENT: Negative; No changes in vision or hearing, sinus congestion, difficulty swallowing Pulmonary: Negative; No cough, wheezing, shortness of breath, hemoptysis Cardiovascular: See HPI: No chest pain, presyncope, syncope, palpatations, intermitent leg swelling with venous insufficiency GI: Negative; No nausea, vomiting, diarrhea, or abdominal pain GU: Negative; No dysuria, hematuria, or difficulty voiding Musculoskeletal: Negative; no myalgias, joint pain, or weakness Hematologic: History of iron deficiency ; no easy bruising, bleeding Endocrine: Negative; no heat/cold intolerance; no diabetes, Rheumatologic: Positive for history of gout Neuro: Negative; no changes in balance, headaches Skin:  Malar rash Psychiatric: Negative; No behavioral problems, depression Sleep:  Positive for sleep apnea;  No snoring, daytime sleepiness, hypersomnolence, bruxism, restless legs, hypnogognic hallucinations. Other comprehensive 14 point system review is negative   Physical Exam BP 135/75 mmHg  Pulse 67  Ht 5' 3.5" (1.613 m)  Wt 263 lb 3.2 oz (119.387 kg)  BMI 45.89 kg/m2     Wt Readings from Last 3 Encounters:  11/04/15 263 lb 3.2 oz (119.387 kg)  10/17/15 262 lb (118.842 kg)  09/22/15 268 lb (121.564 kg)   General: Alert, oriented, no distress.  Morbid obesity Skin: normal turgor, no rashes, warm and dry HEENT: Normocephalic, atraumatic. Pupils equal round and reactive to light; sclera anicteric; extraocular muscles intact, No lid lag; Nose without nasal septal hypertrophy; Mouth/Parynx benign; Mallinpatti scale 3/4 Neck: Thick neck No JVD, no carotid bruits; normal carotid upstroke Lungs: clear to ausculatation and percussion bilaterally; no wheezing or rales, normal inspiratory and expiratory effort Chest wall: without tenderness to palpitation Heart: PMI not displaced, RRR, s1 s2 normal, 2/6systolic murmur, No  diastolic murmur, no rubs, gallops, thrills, or heaves Abdomen: Moderately severe central adiposity soft, nontender; no hepatosplenomehaly, BS+; abdominal aorta nontender and not dilated by palpation. Back: no CVA tenderness Pulses: 2+  Musculoskeletal: full range of motion, normal strength, no joint deformities Extremities: Pulses 2+, venous stasis changes; with 1+, Somewhat tense lower extremity edema bilaterally; no clubbing cyanosis; Homan's sign negative  Neurologic: grossly nonfocal; Cranial nerves grossly wnl Psychologic: Normal mood and affect  ECG (independently read by me): Sinus rhythm at 67 bpm.  Nondiagnostic T-wave changes with T-wave inversion in lead 3 and aVF.  May 2016 ECG (independently read by me): Sinus rhythm with ventricular rate at 70 bpm.  T-wave inversion in leads 3 and aVF.  QTc interval 494 ms.  ECG (independently read by me): Normal sinus rhythm at 78 beats per minute.  LVH by voltage criteria in aVL.  No significant ST changes. QTc interval 437 ms.  LABS:  BMP Latest Ref Rng 05/07/2014 03/26/2014 08/16/2011  Glucose 70 - 99 mg/dL 114(H) 103(H) 98  BUN 6 - 23 mg/dL _0 Creatinine 0.50 - 1.10 mg/dL 1.05 1.20(H)  1.29(H)  Sodium 135 - 145 mEq/L 144 142 143  Potassium 3.5 - 5.3 mEq/L 4.1 3.4(L) 3.7  Chloride 96 - 112 mEq/L 107 101 102  CO2 19 - 32 mEq/L _1 Calcium 8.4 - 10.5 mg/dL 8.9 9.0 9.8   Hepatic Function Latest Ref Rng 03/26/2014 09/13/2009 10/08/2008  Total Protein 6.0 - 8.3 g/dL 7.2 6.2 7.9  Albumin 3.5 - 5.2 g/dL 4.0 3.3(L) 4.3  AST 0 - 37 U/L _2 ALT 0 - 35 U/L 32 17 20  Alk Phosphatase 39 - 117 U/L 96 46 90  Total Bilirubin 0.2 - 1.2 mg/dL 0.6 0.7 0.4   CBC Latest Ref Rng 05/25/2015 03/26/2014 05/28/2013  WBC 4.0 - 10.5 K/uL 3.7(L) 4.1 4.2  Hemoglobin 12.0 - 15.0 g/dL 10.8(L) 11.8(L) 12.3  Hematocrit 36.0 - 46.0 % 33.5(L) 35.8(L) 36.7  Platelets 150 - 400 K/uL 360 385 425(H)    Lab Results  Component Value Date   MCV 90.8 05/25/2015   MCV 89.5 03/26/2014   MCV 89.7 05/28/2013    Lab Results  Component Value Date   TSH 2.808 03/26/2014   Lab Results  Component Value Date   HGBA1C 5.3 08/01/2015   Lipid Panel     Component Value Date/Time   CHOL 207* 03/26/2014 1035   TRIG 256* 03/26/2014 1035   HDL 39* 03/26/2014 1035   CHOLHDL 5.3 03/26/2014 1035   VLDL 51* 03/26/2014 1035   LDLCALC 117* 03/26/2014 1035    INR today in office: 2.4  RADIOLOGY: No results found.    ASSESSMENT AND PLAN: Ms. Hyun is a 60 year old morbidly obese African-American female has a history of previous nonischemic cardiomyopathy with an ejection fraction of approximately 35% initially.  Subsequent echo Doppler study in 2013 showed her ejection fraction at 40-45%.  She has been maintaining normal sinus rhythm and has not had recurrent atrial fibrillation.  She has venous insufficiency and has bilateral lower extremity edema.  Although prescribed, she has not been using support stockings.  She has a history of hypertension.  She is no longer taking her torsemide at bedtime but has been taking 20 mg twice a day.  She is followed by Dr. Collene Mares for iron deficiency.  She had lab work  done recently, which I do not have the results.  The patient thinks that her  iron level was very high.  I will try to obtain this result and if she is now supratherapeutic iron will be discontinued.  She will be undergoing both an EGD and colonoscopy, which is tentatively scheduled for August 1.  She is maintaining sinus rhythm without recurrent atrial fibrillation.  She will need to hold her Coumadin and was advised by Dr. Collene Mares that she should hold this for 5 days prior to the procedure.  I have recommended that Coumadin be restarted no more than 2 days following the procedure, since it will take approximately 3 days for her again to become therapeutic.  Her blood pressure today is controlled on her regimen consisting of hydralazine 25 mg twice a day, long-acting Coreg CR 80 mg and her torsemide.  In the past.  She developed facial swelling secondary to lisinopril as well as valsartan.  I again recommended compression stockings for her venous insufficiency.  We discussed the importance of weight loss and she has lost 5 pounds since I had seen her one year ago, but body mass index is still 45.89.  I have given her clearance to undergo the colonoscopy.  I will see her in 6 months for reevaluation.  Time spent: 25 minutes  Troy Sine, MD, [facility]  11/06/2015 2:12 PM

## 2015-11-08 ENCOUNTER — Telehealth: Payer: Self-pay | Admitting: Cardiovascular Disease

## 2015-11-08 NOTE — Telephone Encounter (Signed)
Pt calling in with concerns regarding her lab results from her GI doctor- [facility] MD Collene Mares.  Pt reports Ferritin (serum) level was 538 and is concerned, would like MD Claiborne Billings to be aware.  Will get records faxed from [facility] and make MD [facility] aware.

## 2015-11-08 NOTE — Telephone Encounter (Signed)
New message      Calling to give lab results to [place] from GI doctor

## 2015-11-10 ENCOUNTER — Telehealth: Payer: Self-pay | Admitting: Cardiovascular Disease

## 2015-11-10 NOTE — Telephone Encounter (Signed)
New message     Calling to see what Dr Claiborne Billings said about her ferritan level.  It was checked by Dr Collene Mares and it was faxed to [place] several days ago.  OK to leave msg on vm

## 2015-11-10 NOTE — Telephone Encounter (Signed)
Returned pt call-pt made aware MD Claiborne Billings is [place] this week and will return on Monday.  Also advised pt to make PCP aware.  Pt verbalized understanding.

## 2015-11-15 ENCOUNTER — Ambulatory Visit (INDEPENDENT_AMBULATORY_CARE_PROVIDER_SITE_OTHER): Payer: Managed Care, Other (non HMO) | Admitting: Pharmacist Clinician (PhC)/ Clinical Pharmacy Specialist

## 2015-11-15 DIAGNOSIS — I480 Paroxysmal atrial fibrillation: Secondary | ICD-10-CM

## 2015-11-15 DIAGNOSIS — Z7901 Long term (current) use of anticoagulants: Secondary | ICD-10-CM | POA: Diagnosis not present

## 2015-11-15 LAB — POCT INR: INR: 3.1

## 2015-11-16 ENCOUNTER — Encounter (HOSPITAL_COMMUNITY): Payer: Self-pay | Admitting: *Deleted

## 2015-11-21 ENCOUNTER — Encounter: Payer: Managed Care, Other (non HMO) | Attending: Cardiovascular Disease | Admitting: Dietician

## 2015-11-21 DIAGNOSIS — I5022 Chronic systolic (congestive) heart failure: Secondary | ICD-10-CM | POA: Diagnosis present

## 2015-11-21 DIAGNOSIS — I5023 Acute on chronic systolic (congestive) heart failure: Secondary | ICD-10-CM

## 2015-11-21 NOTE — Patient Instructions (Addendum)
Great job on the changes that you have made with your eating habits! Resume walking when approved by MD. Decrease your coffee intake. Continue to follow a low sodium diet. Continue the fluid restriction per MD.

## 2015-11-21 NOTE — Progress Notes (Signed)
Medical Nutrition Therapy:  Appt start time: [id number]  end time:  1045.   Assessment: 10/17/15 Primary concerns today: Patient is here alone to try to eat healthier and lose weight.  She states that she has been on Belviq for the past year and noticed a decreased appetite. Hx includes OSA on c-pap, HTN, Pulmonary HTN, Lupus, Cardiomyopathy, CKD.  She is on Coumadin as well.  She states that she is to restrict her intake to 1500 ml daily but she has not been able to do this.  She does not follow a low sodium diet but does not use added salt.  Patient lives with her husband.  Both shop and cook.  Husband states that he is supportive about healthy eating but he gets big bags of chips and eats them in the bed.  He states, "you just have to have the willpower".  He also buys increased amounts of chips and cookies.  When we discussed meal prep, "I'm retired.  She has her jobs and I have mine."  She works second shift at [employer] from 2:30-11:30.  Her son has recently sent [employer] meals.  Weight hx: Lowest adult weight:  107 lbs at 60 yo Highest adult weight 270 lbs 4 months ago ("fluid") Today's weight 262 lbs. Goal:  140 lbs  11/21/15: Patient is here alone.  She had her husband wait in the waiting room.  Her weight today was 263 lbs.  It appears that she is retaining significant amount of fluid.  She has made several changes to her diet including increasing her intake of fruits and vegetables and before ate mostly just meat, she is not eating out of the vending machine, eating chips and is not eating out.   Also, she is now drinking mostly water.   Preferred Learning Style:   No preference indicated   Learning Readiness:   Ready  Change in progress  MEDICATIONS: see list to include Balviq, synthroid, MVI, Omega-3, iron, and coumadin.   DIETARY INTAKE:  Usual eating pattern includes 3 meals and 1 snacks per day. Everyday foods include fast foods, and vending machine food.  Avoided  foods include added salt.    24-hr recall: initial visit B (12 PM): 3-4 strips bacon, 2 boiled eggs, 2 pieces of cheese toast, OJ at times OR OJ and Egg McMuffin from [employer] Snk ( AM): none L (7 PM): something from the vending machine (NABS), cappuccino Milkshake from [employer] Snk ( PM): none D (11 PM): cappuccino Milkshake from [employer] if she has not had earlier and grilled chicken sandwich and/or 2 bags of chips and regular Dr. Malachi Bonds Snk ( PM): chips Beverages: coffee with 2 sugar and coffeemate, regular Dr. Malachi Bonds, flavored Seltzer water but dislikes, sweet tea, water,   Usual physical activity: walked for 5 weeks straight for 22 minutes per day but stopped with increased rain.  Estimated energy needs: 1500 calories 170 g carbohydrates 112 g protein 42 g fat  Progress Towards Goal(s):  In progress.   Nutritional Diagnosis:  NB-1.1 Food and nutrition-related knowledge deficit As related to healthy eating choices for weight managment.  As evidenced by diet hx.    Intervention:  Nutrition counseling/education related to normalizing of eating behavior and mindful eating for weight loss continued.  Discussed getting her fluid retention evaluated with her MD.  Doristine Devoid job on the changes that you have made with your eating habits! Resume walking when approved by MD. Decrease your coffee intake. Continue to follow a low  sodium diet. Continue the fluid restriction per MD  Teaching Method Utilized:  Visual Auditory Hands on  Handouts given during visit include:  Meal plan card  My plate  Snack list  Label reading  Low Sodium Nutrition Therapy from AND  Barriers to learning/adherence to lifestyle change: varying support from husband  Demonstrated degree of understanding via:  Teach Back   Monitoring/Evaluation:  Dietary intake, exercise, label reading, and body weight prn.

## 2015-11-22 ENCOUNTER — Ambulatory Visit (HOSPITAL_COMMUNITY): Payer: Managed Care, Other (non HMO) | Admitting: Certified Registered Nurse Anesthetist

## 2015-11-22 ENCOUNTER — Encounter (HOSPITAL_COMMUNITY)
Admission: RE | Disposition: A | Discharge status: Home / Self Care | Payer: Self-pay | Source: Ambulatory Visit | Attending: Gastroenterology

## 2015-11-22 ENCOUNTER — Encounter (HOSPITAL_COMMUNITY): Payer: Self-pay | Admitting: Certified Registered Nurse Anesthetist

## 2015-11-22 ENCOUNTER — Ambulatory Visit (HOSPITAL_COMMUNITY)
Admission: RE | Admit: 2015-11-22 | Discharge: 2015-11-22 | Disposition: A | Discharge status: Home / Self Care | Payer: Managed Care, Other (non HMO) | Source: Ambulatory Visit | Attending: Gastroenterology | Admitting: Gastroenterology

## 2015-11-22 ENCOUNTER — Telehealth: Payer: Self-pay | Admitting: Cardiovascular Disease

## 2015-11-22 DIAGNOSIS — Z800 Family history of malignant neoplasm of digestive organs: Secondary | ICD-10-CM | POA: Insufficient documentation

## 2015-11-22 DIAGNOSIS — Z79899 Other long term (current) drug therapy: Secondary | ICD-10-CM | POA: Insufficient documentation

## 2015-11-22 DIAGNOSIS — Z87891 Personal history of nicotine dependence: Secondary | ICD-10-CM | POA: Diagnosis not present

## 2015-11-22 DIAGNOSIS — Z9989 Dependence on other enabling machines and devices: Secondary | ICD-10-CM | POA: Insufficient documentation

## 2015-11-22 DIAGNOSIS — K5730 Diverticulosis of large intestine without perforation or abscess without bleeding: Secondary | ICD-10-CM | POA: Insufficient documentation

## 2015-11-22 DIAGNOSIS — I4891 Unspecified atrial fibrillation: Secondary | ICD-10-CM | POA: Insufficient documentation

## 2015-11-22 DIAGNOSIS — I130 Hypertensive heart and chronic kidney disease with heart failure and stage 1 through stage 4 chronic kidney disease, or unspecified chronic kidney disease: Secondary | ICD-10-CM | POA: Diagnosis not present

## 2015-11-22 DIAGNOSIS — N189 Chronic kidney disease, unspecified: Secondary | ICD-10-CM | POA: Insufficient documentation

## 2015-11-22 DIAGNOSIS — Z6841 Body Mass Index (BMI) 40.0 and over, adult: Secondary | ICD-10-CM | POA: Diagnosis not present

## 2015-11-22 DIAGNOSIS — D122 Benign neoplasm of ascending colon: Secondary | ICD-10-CM | POA: Diagnosis not present

## 2015-11-22 DIAGNOSIS — D123 Benign neoplasm of transverse colon: Secondary | ICD-10-CM | POA: Diagnosis not present

## 2015-11-22 DIAGNOSIS — Z7901 Long term (current) use of anticoagulants: Secondary | ICD-10-CM | POA: Diagnosis not present

## 2015-11-22 DIAGNOSIS — E079 Disorder of thyroid, unspecified: Secondary | ICD-10-CM | POA: Insufficient documentation

## 2015-11-22 DIAGNOSIS — Z79890 Hormone replacement therapy (postmenopausal): Secondary | ICD-10-CM | POA: Diagnosis not present

## 2015-11-22 DIAGNOSIS — G4730 Sleep apnea, unspecified: Secondary | ICD-10-CM | POA: Insufficient documentation

## 2015-11-22 DIAGNOSIS — I429 Cardiomyopathy, unspecified: Secondary | ICD-10-CM | POA: Diagnosis not present

## 2015-11-22 DIAGNOSIS — Z1211 Encounter for screening for malignant neoplasm of colon: Secondary | ICD-10-CM | POA: Insufficient documentation

## 2015-11-22 DIAGNOSIS — D509 Iron deficiency anemia, unspecified: Secondary | ICD-10-CM | POA: Insufficient documentation

## 2015-11-22 DIAGNOSIS — M329 Systemic lupus erythematosus, unspecified: Secondary | ICD-10-CM | POA: Diagnosis not present

## 2015-11-22 DIAGNOSIS — K449 Diaphragmatic hernia without obstruction or gangrene: Secondary | ICD-10-CM | POA: Diagnosis not present

## 2015-11-22 DIAGNOSIS — I509 Heart failure, unspecified: Secondary | ICD-10-CM | POA: Diagnosis not present

## 2015-11-22 DIAGNOSIS — R1013 Epigastric pain: Secondary | ICD-10-CM | POA: Insufficient documentation

## 2015-11-22 HISTORY — PX: ESOPHAGOGASTRODUODENOSCOPY (EGD) WITH PROPOFOL: SHX5813

## 2015-11-22 HISTORY — PX: COLONOSCOPY WITH PROPOFOL: SHX5780

## 2015-11-22 SURGERY — ESOPHAGOGASTRODUODENOSCOPY (EGD) WITH PROPOFOL
Anesthesia: Monitor Anesthesia Care

## 2015-11-22 MED ORDER — PROPOFOL 10 MG/ML IV BOLUS
INTRAVENOUS | Status: AC
  Filled 2015-11-22: qty 20

## 2015-11-22 MED ORDER — LACTATED RINGERS IV SOLN
INTRAVENOUS | Status: DC
  Administered 2015-11-22: 07:00:00 via INTRAVENOUS

## 2015-11-22 MED ORDER — PROPOFOL 500 MG/50ML IV EMUL
INTRAVENOUS | Status: DC | PRN
  Administered 2015-11-22: 125 ug/kg/min via INTRAVENOUS

## 2015-11-22 MED ORDER — LIDOCAINE HCL (CARDIAC) 20 MG/ML IV SOLN
INTRAVENOUS | Status: DC | PRN
  Administered 2015-11-22: 50 mg via INTRAVENOUS

## 2015-11-22 MED ORDER — SODIUM CHLORIDE 0.9 % IV SOLN: INTRAVENOUS | Status: DC

## 2015-11-22 MED ORDER — LIDOCAINE HCL (CARDIAC) 20 MG/ML IV SOLN
INTRAVENOUS | Status: AC
  Filled 2015-11-22: qty 5

## 2015-11-22 MED ORDER — PROPOFOL 10 MG/ML IV BOLUS
INTRAVENOUS | Status: AC
  Filled 2015-11-22: qty 40

## 2015-11-22 MED ORDER — PROPOFOL 500 MG/50ML IV EMUL
INTRAVENOUS | Status: DC | PRN
  Administered 2015-11-22 (×2): 30 mg via INTRAVENOUS

## 2015-11-22 SURGICAL SUPPLY — 25 items

## 2015-11-22 NOTE — Telephone Encounter (Signed)
New message       Pt c/o medication issue:  1. Name of Medication: coumadin  2. How are you currently taking this medication (dosage and times per day)? 5 mg then alternate to 7.5 mg on Monday  3. Are you having a reaction (difficulty breathing--STAT)? no  4. What is your medication issue? The pt just had  A colonscopy and needs to know when to restart the medication was instructed to call her heart doctor

## 2015-11-22 NOTE — Op Note (Signed)
[facility] Patient Name: Caitlyn Fuentes Procedure Date: 11/22/2015 MRN: [id number] Attending MD: Juanita Craver , MD Date of Birth: 04/05/1956 CSN: [id number] Age: 60 Admit Type: Outpatient Procedure:                Colonoscopy with cold biopsies x 3 and hot snare                            polypectomy x 1. Indications:              Iron deficiency anemia; CRC screening for                            colorectal malignant neoplasm. Providers:                Juanita Craver, MD, Cleda Daub, RN, William Dalton, Technician, Herbie Drape, CRNA Referring MD:             Arlyss Repress, MD Medicines:                Monitored Anesthesia Care Complications:            No immediate complications. Estimated Blood Loss:     Estimated blood loss was minimal. Procedure:                Pre-anesthesia assessment: - Prior to the                            procedure, a history and physical was performed,                            and patient medications and allergies were                            reviewed. The patient's tolerance of previous                            anesthesia was also reviewed. The risks and                            benefits of the procedure and the sedation options                            and risks were discussed with the patient. All                            questions were answered, and informed consent was                            obtained. Prior anticoagulants: The patient has                            taken Coumadin (warfarin), last dose was 5 days  prior to procedure. ASA Grade assessment: III - A                            patient with severe systemic disease. After                            reviewing the risks and benefits, the patient was                            deemed in satisfactory condition to undergo the                            procedure. After obtaining informed consent, the                             colonoscope was passed under direct vision.                            Throughout the procedure, the patient's blood                            pressure, pulse, and oxygen saturations were                            monitored continuously. The [id number]                            scope was introduced through the anus and advanced                            to the the cecum, identified by appendiceal orifice                            and ileocecal valve. The colonoscopy was performed                            without difficulty. The patient tolerated the                            procedure well. The quality of the bowel                            preparation was adequate. The ileocecal valve, the                            appendiceal orifice and the rectum were                            photographed. The bowel preparation used was                            GoLYTELY. Scope In: 7:41:51 AM Scope Out: 8:06:21 AM Total Procedure Duration: 0 hours 24 minutes 30 seconds  Findings:      A  8 mm sessile polyp was found in the proximal ascending colon; the       polyp was removed with a hot snare x 1-200/20; resection and retrieval       were complete.      A small sessile polyp was found in the distal transverse colon-this was       removed by cold biopsies x 3.      A few medium-mouthed diverticula were found in the sigmoid colon.      No additional abnormalities were found on retroflexion. Impression:               - One 8 mm polyp in the proximal ascending colon,                            removed with a hot snare x 1; resected and                            retrieved.                           - One small sessile polyp in the distal transverse                            colon-removed by cold biopsies.                           - Few scattered diverticula in the sigmoid colon. Moderate Sedation:      MAC used. Recommendation:           - High fiber, low  fat diet with augmented water                            consumption daily.                           - Continue present medications.                           - Await pathology results.                           - No Ibuprofen, Naproxen, Coumadin or other                            non-steroidal anti-inflammatory drugs for 10 days                            after polyp removal.                           - Return to my office in 2 weeks.                           - If the patient has any abnormal GI symptoms in                            the interim, she has been  advised to call the                            office ASAP for further recommendations.                           - Repeat colonoscopy in 5 years for surveillance. Procedure Code(s):        --- Professional ---                           [id number], Colonoscopy, flexible; with removal of                            tumor(s), polyp(s), or other lesion(s) by snare                            technique                           45380, 70, Colonoscopy, flexible; with biopsy,                            single or multiple Diagnosis Code(s):        --- Professional ---                           D50.9, Iron deficiency anemia, unspecified                           D12.3, Benign neoplasm of transverse colon (hepatic                            flexure or splenic flexure)                           Z12.11, Encounter for screening for malignant                            neoplasm of colon                           D12.2, Benign neoplasm of ascending colon                           K57.30, Diverticulosis of large intestine without                            perforation or abscess without bleeding CPT copyright 2016 American Medical Association. All rights reserved. The codes documented in this report are preliminary and upon coder review may  be revised to meet current compliance requirements. Juanita Craver, MD Juanita Craver, MD 11/22/2015 8:24:49  AM This report has been signed electronically. Number of Addenda: 0

## 2015-11-22 NOTE — Anesthesia Preprocedure Evaluation (Addendum)
Anesthesia Evaluation  Patient identified by MRN, date of birth, ID band Patient awake    Reviewed: Allergy & Precautions, NPO status , Patient's Chart, lab work & pertinent test results  Airway Mallampati: II  TM Distance: >3 FB Neck ROM: Full    Dental no notable dental hx.    Pulmonary shortness of breath and with exertion, sleep apnea , former smoker,    Pulmonary exam normal breath sounds clear to auscultation       Cardiovascular Exercise Tolerance: Poor hypertension, +CHF and + Orthopnea  Normal cardiovascular exam+ dysrhythmias Atrial Fibrillation  Rhythm:Regular Rate:Normal  Pulmonary htn   Neuro/Psych negative neurological ROS  negative psych ROS   GI/Hepatic negative GI ROS, Neg liver ROS,   Endo/Other  Morbid obesity  Renal/GU negative Renal ROS  negative genitourinary   Musculoskeletal lupus   Abdominal   Peds negative pediatric ROS (+)  Hematology negative hematology ROS (+)   Anesthesia Other Findings   Reproductive/Obstetrics negative OB ROS                           Anesthesia Physical Anesthesia Plan  ASA: II  Anesthesia Plan: MAC   Post-op Pain Management:    Induction:   Airway Management Planned: Simple Face Mask  Additional Equipment:   Intra-op Plan:   Post-operative Plan:   Informed Consent: I have reviewed the patients History and Physical, chart, labs and discussed the procedure including the risks, benefits and alternatives for the proposed anesthesia with the patient or authorized representative who has indicated his/her understanding and acceptance.   Dental advisory given  Plan Discussed with: CRNA  Anesthesia Plan Comments:         Anesthesia Quick Evaluation

## 2015-11-22 NOTE — Discharge Instructions (Signed)
Colonoscopy, Care After These instructions give you information on caring for yourself after your procedure. Your doctor may also give you more specific instructions. Call your doctor if you have any problems or questions after your procedure. HOME CARE  Do not drive for 24 hours.  Do not sign important papers or use machinery for 24 hours.  You may shower.  You may go back to your usual activities, but go slower for the first 24 hours.  Take rest breaks often during the first 24 hours.  Walk around or use warm packs on your belly (abdomen) if you have belly cramping or gas.  Drink enough fluids to keep your pee (urine) clear or pale yellow.  Resume your normal diet. Avoid heavy or fried foods.  Avoid drinking alcohol for 24 hours or as told by your doctor.  Only take medicines as told by your doctor. If a tissue sample (biopsy) was taken during the procedure:   Do not take aspirin or blood thinners for 7 days, or as told by your doctor.  Do not drink alcohol for 7 days, or as told by your doctor.  Eat soft foods for the first 24 hours. GET HELP IF: You still have a small amount of blood in your poop (stool) 2-3 days after the procedure. GET HELP RIGHT AWAY IF:  You have more than a small amount of blood in your poop.  You see clumps of tissue (blood clots) in your poop.  Your belly is puffy (swollen).  You feel sick to your stomach (nauseous) or throw up (vomit).  You have a fever.  You have belly pain that gets worse and medicine does not help. MAKE SURE YOU:  Understand these instructions.  Will watch your condition.  Will get help right away if you are not doing well or get worse.   This information is not intended to replace advice given to you by your health care provider. Make sure you discuss any questions you have with your health care provider.   Document Released: 05/12/2010 Document Revised: 04/14/2013 Document Reviewed: 12/15/2012 Elsevier  Interactive Patient Education 2016 [place]. Esophagogastroduodenoscopy, Care After Refer to this sheet in the next few weeks. These instructions provide you with information about caring for yourself after your procedure. Your health care provider may also give you more specific instructions. Your treatment has been planned according to current medical practices, but problems sometimes occur. Call your health care provider if you have any problems or questions after your procedure. WHAT TO EXPECT AFTER THE PROCEDURE After your procedure, it is typical to feel:  Soreness in your throat.  Pain with swallowing.  Sick to your stomach (nauseous).  Bloated.  Dizzy.  Fatigued. HOME CARE INSTRUCTIONS  Do not eat or drink anything until the numbing medicine (local anesthetic) has worn off and your gag reflex has returned. You will know that the local anesthetic has worn off when you can swallow comfortably.  Do not drive or operate machinery until directed by your health care provider.  Take medicines only as directed by your health care provider. SEEK MEDICAL CARE IF:   You cannot stop coughing.  You are not urinating at all or less than usual. SEEK IMMEDIATE MEDICAL CARE IF:  You have difficulty swallowing.  You cannot eat or drink.  You have worsening throat or chest pain.  You have dizziness or lightheadedness or you faint.  You have nausea or vomiting.  You have chills.  You have a fever.  You have severe abdominal pain.  You have black, tarry, or bloody stools.   This information is not intended to replace advice given to you by your health care provider. Make sure you discuss any questions you have with your health care provider.   Document Released: 03/26/2012 Document Revised: 04/30/2014 Document Reviewed: 03/26/2012 Elsevier Interactive Patient Education [employer].

## 2015-11-22 NOTE — Telephone Encounter (Signed)
LMOM for patient to re-start warfarin today, keep appt for August 14.

## 2015-11-22 NOTE — Transfer of Care (Signed)
Immediate Anesthesia Transfer of Care Note  Patient: Caitlyn Fuentes  Procedure(s) Performed: Procedure(s): ESOPHAGOGASTRODUODENOSCOPY (EGD) WITH PROPOFOL (N/A) COLONOSCOPY WITH PROPOFOL (N/A)  Patient Location: PACU  Anesthesia Type:MAC  Level of Consciousness: awake, alert  and oriented  Airway & Oxygen Therapy: Patient Spontanous Breathing and Patient connected to nasal cannula oxygen  Post-op Assessment: Report given to RN and Post -op Vital signs reviewed and stable  Post vital signs: Reviewed and stable  Last Vitals:  Vitals:   11/22/15 0701  BP: (!) 149/57  Pulse: 65  Resp: 15  Temp: 36.6 C    Last Pain:  Vitals:   11/22/15 0701  TempSrc: Oral         Complications: No apparent anesthesia complications

## 2015-11-22 NOTE — H&P (Signed)
Caitlyn Fuentes is an 60 y.o. female.   Chief Complaint: Colorectal cancer screening. HPI: 60 year old black female here for an EGD/Colonoscopy. See office notes for details.  Past Medical History:  Diagnosis Date  . A-fib ([place])   . Cardiomyopathy   . Hypertension   . Lupus ([place])   . Pulmonary hypertension ([place]) 05/10/2011   Echo, EF-40-45  . CKD   . Sleep apnea 2008   CPAP, pt does not know settings  . Thyroid disease    Past Surgical History:  Procedure Laterality Date  .  c sections  x 2  . ABDOMINAL HYSTERECTOMY     complete  . CARDIAC CATHETERIZATION     Nonischemic, EF-40, continued medical therapy  . CARDIAC CATHETERIZATION  10/02/2006   No significant CAD   Family History  Problem Relation Age of Onset  . Heart disease Mother   . Heart disease Father    Social History:  reports that she has quit smoking. She has never used smokeless tobacco. She reports that she drinks alcohol. She reports that she does not use drugs.  Allergies:  Allergies  Allergen Reactions  . Diovan [Valsartan] Other (See Comments)    Messes up kidneys  . Lisinopril Swelling    Facial swelling   Medications Prior to Admission  Medication Sig Dispense Refill  . allopurinol (ZYLOPRIM) 100 MG tablet Take 200 mg by mouth daily.     Marland Kitchen atorvastatin (LIPITOR) 40 MG tablet TAKE 1 TABLET (40 MG TOTAL) BY MOUTH DAILY. 90 tablet 2  . BELVIQ 10 MG TABS Take 1 tablet by mouth 2 (two) times daily.  2  . celecoxib (CELEBREX) 200 MG capsule Take 200 mg by mouth as needed for mild pain.   0  . colchicine 0.6 MG tablet Take 0.6 mg by mouth daily.    Marland Kitchen COREG CR 80 MG 24 hr capsule TAKE 1 CAPSULE (80 MG TOTAL) BY MOUTH DAILY. 90 capsule 3  . estradiol (ESTRACE) 2 MG tablet Take 2 mg by mouth daily.     . ferrous sulfate 325 (65 FE) MG tablet Take 325 mg by mouth daily with breakfast.    . hydrALAZINE (APRESOLINE) 25 MG tablet TAKE 1 TABLET (25 MG TOTAL) BY MOUTH 2 (TWO) TIMES DAILY. 180 tablet 2  .  levothyroxine (SYNTHROID, LEVOTHROID) 25 MCG tablet Take 25 mcg by mouth daily.  4  . Multiple Vitamin (MULITIVITAMIN WITH MINERALS) TABS Take 1 tablet by mouth daily.    Marland Kitchen omega-3 acid ethyl esters (LOVAZA) 1 g capsule Take 1 g by mouth daily.    Marland Kitchen torsemide (DEMADEX) 20 MG tablet Take 1 tablet (20 mg total) by mouth 2 (two) times daily. (Patient taking differently: Take 40 mg by mouth 2 (two) times daily. ) 180 tablet 1  . warfarin (COUMADIN) 5 MG tablet Take 1-1.5 tablets by mouth daily as directed by coumadin clinic (Patient taking differently: Take 5-7.5 mg by mouth daily at 6 PM. Take 1 1/2 tablet on Mondays ONLY. Take 1 tablet all other days) 40 tablet 1  . zolpidem (AMBIEN) 10 MG tablet Take 10 mg by mouth at bedtime as needed for sleep.      No results found for this or any previous visit (from the past 48 hour(s)). No results found.  Review of Systems  Constitutional: Negative.   HENT: Negative.   Eyes: Negative.   Respiratory: Negative.   Cardiovascular: Negative.   Gastrointestinal: Positive for blood in stool. Negative for abdominal pain,  diarrhea, heartburn, nausea and vomiting.  Musculoskeletal: Positive for joint pain.  Skin: Negative.   Neurological: Negative.   Endo/Heme/Allergies: Negative.   Psychiatric/Behavioral: The patient has insomnia.    Blood pressure (!) 149/57, pulse 65, temperature 97.9 F (36.6 C), temperature source Oral, resp. rate 15, height 5' 3.5" (1.613 m), weight 119.3 kg (263 lb), SpO2 98 %. Physical Exam  Constitutional: She is oriented to person, place, and time. She appears well-developed and well-nourished.  HENT:  Head: Normocephalic and atraumatic.  Eyes: Conjunctivae and EOM are normal. Pupils are equal, round, and reactive to light.  Neck: Normal range of motion. Neck supple.  Cardiovascular: Normal rate and regular rhythm.   Respiratory: Effort normal and breath sounds normal.  GI: Soft. Bowel sounds are normal. She exhibits no  distension and no mass. There is no tenderness. There is no rebound and no guarding.  Neurological: She is alert and oriented to person, place, and time.  Skin: Skin is warm and dry.  Psychiatric: She has a normal mood and affect. Her behavior is normal. Judgment and thought content normal.    Assessment/Plan Colorectal cancer screening/Family history of colon cancer/IDA: proceed with an EGD/Colonoscopy at this time. Her Coumadin has been held 5 days ago.  Byanca Kasper, MD 11/22/2015, 7:07 AM

## 2015-11-22 NOTE — Op Note (Signed)
[facility] Patient Name: Caitlyn Fuentes Procedure Date: 11/22/2015 MRN: [id number] Attending MD: Juanita Craver , MD Date of Birth: [birth year] CSN: [id number] Age: 60 Admit Type: Outpatient Procedure:                Diagnostic EGD. Indications:              Iron deficiency anemia, Epigastric pain. Providers:                Juanita Craver, MD, Cleda Daub, RN, William Dalton, Technician, Herbie Drape, CRNA. Referring MD:             Arlyss Repress, MD Medicines:                Monitored anesthesia care. Complications:            No immediate complications. Estimated Blood Loss:     Estimated blood loss: none. Procedure:                Pre-anesthesia assessment: - Prior to the                            procedure, a history and physical was performed,                            and patient medications and allergies were                            reviewed. The patient's tolerance of previous                            anesthesia was also reviewed. The risks and                            benefits of the procedure and the sedation options                            and risks were discussed with the patient. All                            questions were answered, and informed consent was                            obtained. Prior anticoagulants: The patient has                            taken Coumadin (warfarin), last dose was 5 days                            prior to procedure. ASA Grade assessment: III - A                            patient with severe systemic disease. After  reviewing the risks and benefits, the patient was                            deemed in satisfactory condition to undergo the                            procedure. After obtaining informed consent, the                            endoscope was passed under direct vision.                            Throughout the procedure, the patient's blood                             pressure, pulse, and oxygen saturations were                            monitored continuously. The [id number]                            scope was introduced through the mouth, and                            advanced to the second part of duodenum. The EGD                            was accomplished without difficulty. The patient                            tolerated the procedure well. Scope In: Scope Out: Findings:      The examined esophagus and GEJ appeared widely patent and normal.      A medium-sized hiatal hernia was noted on retroflexion; the rest of the       stomach appeared normal.      The examined duodenum was normal. Impression:               - Normal appearing, widely patent esophagus and GEJ.                           - Medium-sized hiatal hernia; otherwise normal                            appearing stomach.                           - Normal examined duodenum.                           - No specimens collected. Moderate Sedation:      MAC given. Recommendation:           - High fiber, low fat diet with augmented water                            consumption daily.                           -  Continue present medications.                           - Return to my office in 2 weeks. Procedure Code(s):        --- Professional ---                           [phone], Esophagogastroduodenoscopy, flexible,                            transoral; diagnostic, including collection of                            specimen(s) by brushing or washing, when performed                            (separate procedure) Diagnosis Code(s):        --- Professional ---                           D50.9, Iron deficiency anemia, unspecified                           R10.13, Epigastric pain                           K44.9, Diaphragmatic hernia without obstruction or                            gangrene CPT copyright 2016 American Medical Association. All rights  reserved. The codes documented in this report are preliminary and upon coder review may  be revised to meet current compliance requirements. Juanita Craver, MD Juanita Craver, MD 11/22/2015 8:15:19 AM This report has been signed electronically. Number of Addenda: 0

## 2015-11-22 NOTE — Anesthesia Postprocedure Evaluation (Signed)
Anesthesia Post Note  Patient: Lars Mage  Procedure(s) Performed: Procedure(s) (LRB): ESOPHAGOGASTRODUODENOSCOPY (EGD) WITH PROPOFOL (N/A) COLONOSCOPY WITH PROPOFOL (N/A)  Patient location during evaluation: Endoscopy Anesthesia Type: MAC Level of consciousness: awake and alert Pain management: pain level controlled Vital Signs Assessment: post-procedure vital signs reviewed and stable Respiratory status: spontaneous breathing, nonlabored ventilation, respiratory function stable and patient connected to nasal cannula oxygen Cardiovascular status: stable and blood pressure returned to baseline Anesthetic complications: no    Last Vitals:  Vitals:   11/22/15 0820 11/22/15 0830  BP: (!) 156/71 105/80  Pulse: 69 64  Resp: 13 18  Temp:      Last Pain:  Vitals:   11/22/15 0701  TempSrc: Oral                 Montez Hageman

## 2015-11-23 ENCOUNTER — Encounter (HOSPITAL_COMMUNITY): Payer: Self-pay | Admitting: Gastroenterology

## 2015-12-05 ENCOUNTER — Ambulatory Visit (INDEPENDENT_AMBULATORY_CARE_PROVIDER_SITE_OTHER): Payer: Managed Care, Other (non HMO) | Admitting: Pharmacist

## 2015-12-05 DIAGNOSIS — Z7901 Long term (current) use of anticoagulants: Secondary | ICD-10-CM | POA: Diagnosis not present

## 2015-12-05 DIAGNOSIS — I480 Paroxysmal atrial fibrillation: Secondary | ICD-10-CM

## 2015-12-05 LAB — POCT INR: INR: 2.2

## 2015-12-21 ENCOUNTER — Other Ambulatory Visit: Payer: Self-pay | Admitting: Cardiovascular Disease

## 2015-12-23 ENCOUNTER — Encounter: Payer: Self-pay | Admitting: Neurology

## 2015-12-23 ENCOUNTER — Other Ambulatory Visit (INDEPENDENT_AMBULATORY_CARE_PROVIDER_SITE_OTHER): Payer: Managed Care, Other (non HMO)

## 2015-12-23 ENCOUNTER — Ambulatory Visit: Payer: Self-pay

## 2015-12-23 ENCOUNTER — Ambulatory Visit (INDEPENDENT_AMBULATORY_CARE_PROVIDER_SITE_OTHER): Payer: Managed Care, Other (non HMO) | Admitting: Neurology

## 2015-12-23 VITALS — BP 126/80 | HR 65 | Ht 63.5 in | Wt 261.0 lb

## 2015-12-23 DIAGNOSIS — M79601 Pain in right arm: Secondary | ICD-10-CM

## 2015-12-23 DIAGNOSIS — E785 Hyperlipidemia, unspecified: Secondary | ICD-10-CM | POA: Diagnosis not present

## 2015-12-23 DIAGNOSIS — M5412 Radiculopathy, cervical region: Secondary | ICD-10-CM

## 2015-12-23 DIAGNOSIS — Z5181 Encounter for therapeutic drug level monitoring: Secondary | ICD-10-CM

## 2015-12-23 DIAGNOSIS — I4891 Unspecified atrial fibrillation: Secondary | ICD-10-CM

## 2015-12-23 DIAGNOSIS — G4733 Obstructive sleep apnea (adult) (pediatric): Secondary | ICD-10-CM

## 2015-12-23 DIAGNOSIS — I671 Cerebral aneurysm, nonruptured: Secondary | ICD-10-CM

## 2015-12-23 DIAGNOSIS — M542 Cervicalgia: Secondary | ICD-10-CM

## 2015-12-23 LAB — LIPID PANEL
CHOL/HDL RATIO: 3
Cholesterol: 145 mg/dL (ref 0–200)
HDL: 41.4 mg/dL (ref >39.00)
LDL CALC: 77 mg/dL (ref 0–99)
NONHDL: 103.25
TRIGLYCERIDES: 129 mg/dL (ref 0.0–149.0)
VLDL: 25.8 mg/dL (ref 0.0–40.0)

## 2015-12-23 NOTE — Progress Notes (Signed)
Caitlyn Fuentes was seen today in neurologic consultation at the request of [name], MD.  This patient is accompanied in the office by her spouse who supplements the history.   The consultation is for the evaluation of R sided weakness.   The patient reports that she woke up with R sided weakness and paresthesias.  She initially states that it was "a little over a week ago" but then corrects herself and states it may have been longer than that.  Numbness is in the face, arm and leg on the right, "like novacaine is wearing off."  She states that it feels "cold."  It has changed to just a little since it started..  She is on coumadin for a-fib.  She has been on that for 10 years, but she was off of it for a colonoscopy.  When I brought that up, she states "yes, it did start after that colonscopy."  She had that colonoscopy on 8/1 and states that her sx's started within a week of that but she cannot state exactly when.  No carotid u/s in our system.  Last echo was in 2013.  She is on lipitor, 40 mg for hyperlipidemia for 4 years.  When questioned, she states her biggest complaint is pain in the right arm.  She describes the pain as a coldness in the right arm.  States that she has to have her husband massage to the right arm because of this.  It started at the same time everything else started.  She is having soreness and pain up into the right neck and right trapezius region.  She goes to bed at 1:30-2am because she works 2nd shift.  It takes a long time to fall asleep and she gets up a few times to use the bathroom.  She gets up at 9am for the day and she is not refreshed.  She does wear CPAP but she thinks that it isn't working well and is having air leakage.  She hasn't had it checked in 10 years.  States that her body weight has increased about 40 lbs in 10 years.  Has no idea what pressure is on her machine.  States that she had an episode many years ago that she would describe as a TIA.  She states  that she had trouble with her words for a very long time.  It started after she had injections in the occiput by Dr. Gwenlyn Saran [facility].  She had an MRI done after that.  I see that she had an MRI done in 2009 for " aphasia" and it was normal.  She reports that she was ultimately seen at [facility], but does not know the diagnosis.  She did recently have a repeat MRI on 12/20/2015.  She brings the CD with her as it was done through [facility].  Diffusion-weighted imaging was negative.  There was no infarction.  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  ALLERGIES:   Allergies  Allergen Reactions  . Diovan [Valsartan] Other (See Comments)    Messes up kidneys  . Lisinopril Swelling    Facial swelling    CURRENT MEDICATIONS:  Outpatient Encounter Prescriptions as of 12/23/2015  Medication Sig  . allopurinol (ZYLOPRIM) 100 MG tablet Take 200 mg by mouth daily.   Marland Kitchen atorvastatin (LIPITOR) 40 MG tablet TAKE 1 TABLET (40 MG TOTAL) BY MOUTH DAILY.  . celecoxib (CELEBREX) 200 MG capsule Take 200 mg by mouth as needed  for mild pain.   Marland Kitchen COREG CR 80 MG 24 hr capsule TAKE 1 CAPSULE (80 MG TOTAL) BY MOUTH DAILY.  Marland Kitchen estradiol (ESTRACE) 2 MG tablet Take 2 mg by mouth daily.   . hydrALAZINE (APRESOLINE) 25 MG tablet TAKE 1 TABLET (25 MG TOTAL) BY MOUTH 2 (TWO) TIMES DAILY.  Marland Kitchen levothyroxine (SYNTHROID, LEVOTHROID) 25 MCG tablet Take 25 mcg by mouth daily.  . Multiple Vitamin (MULITIVITAMIN WITH MINERALS) TABS Take 1 tablet by mouth daily.  Marland Kitchen omega-3 acid ethyl esters (LOVAZA) 1 g capsule Take 1 g by mouth daily.  Marland Kitchen torsemide (DEMADEX) 20 MG tablet Take 1 tablet (20 mg total) by mouth 2 (two) times daily. (Patient taking differently: Take 40 mg by mouth 2 (two) times daily. )  . warfarin (COUMADIN) 5 MG tablet TAKE 1-1.5 TABLETS BY MOUTH DAILY AS DIRECTED BY COUMADIN CLINIC  . zolpidem (AMBIEN) 10 MG tablet Take 10 mg by mouth at bedtime as needed for sleep.   . colchicine  0.6 MG tablet Take 0.6 mg by mouth daily.  . [DISCONTINUED] ferrous sulfate 325 (65 FE) MG tablet Take 325 mg by mouth daily with breakfast.   No facility-administered encounter medications on file as of 12/23/2015.     PAST MEDICAL HISTORY:   Past Medical History:  Diagnosis Date  . A-fib ([place])   . Cardiomyopathy   . Hyperlipidemia   . Hypertension   . Lupus ([place])   . Pulmonary hypertension ([place]) 05/10/2011   Echo, EF-40-45  . Renal disorder   . Sleep apnea 2008   CPAP, pt does not know settings  . Thyroid disease     PAST SURGICAL HISTORY:   Past Surgical History:  Procedure Laterality Date  .  c sections  x 2  . ABDOMINAL HYSTERECTOMY     complete  . CARDIAC CATHETERIZATION     Nonischemic, EF-40, continued medical therapy  . CARDIAC CATHETERIZATION  10/02/2006   No significant CAD  . COLONOSCOPY WITH PROPOFOL N/A 11/22/2015   Procedure: COLONOSCOPY WITH PROPOFOL;  Surgeon: Juanita Craver, MD;  Location: [facility];  Service: Endoscopy;  Laterality: N/A;  . ESOPHAGOGASTRODUODENOSCOPY (EGD) WITH PROPOFOL N/A 11/22/2015   Procedure: ESOPHAGOGASTRODUODENOSCOPY (EGD) WITH PROPOFOL;  Surgeon: Juanita Craver, MD;  Location: [facility];  Service: Endoscopy;  Laterality: N/A;    SOCIAL HISTORY:   Social History   Social History  . Marital status: Married    Spouse name: N/A  . Number of children: N/A  . Years of education: N/A   Occupational History  . Not on file.   Social History Main Topics  . Smoking status: Former Smoker    Quit date: 12/25/2008  . Smokeless tobacco: Never Used  . Alcohol use 0.0 oz/week     Comment: once a month  . Drug use: No  . Sexual activity: Not on file   Other Topics Concern  . Not on file   Social History Narrative  . No narrative on file    FAMILY HISTORY:   Family Status  Relation Status  . Mother Deceased  . Father Alive  . Sister Alive  . Brother Deceased  . Sister Alive  . Daughter Alive  . Son Alive    ROS:  A  complete 10 system review of systems was obtained and was unremarkable apart from what is mentioned above.  PHYSICAL EXAMINATION:    VITALS:   Vitals:   12/23/15 0750  BP: 126/80  Pulse: 65  Weight: 261 lb (118.4 kg)  Height: 5' 3.5" (1.613 m)    GEN:  Normal appears female in no acute distress.  Appears stated age. HEENT:  Normocephalic, atraumatic. The mucous membranes are moist. The superficial temporal arteries are without ropiness or tenderness. Cardiovascular: Regular rate and rhythm. Lungs: Clear to auscultation bilaterally. Neck/Heme: There are no carotid bruits noted bilaterally.  NEUROLOGICAL: Orientation:  The patient is alert and oriented x 3.  Fund of knowledge is appropriate.  Recent and remote memory intact.  Attention span and concentration normal.  Repeats and names without difficulty. Cranial nerves: There is good facial symmetry. The pupils are equal round and reactive to light bilaterally. Fundoscopic exam reveals clear disc margins bilaterally. Extraocular muscles are intact and visual fields are full to confrontational testing. Speech is fluent and clear. Soft palate rises symmetrically and there is no tongue deviation. Hearing is intact to conversational tone. Tone: Tone is good throughout. Sensation: Sensation is intact to light touch and pinprick throughout (facial, trunk, extremities).  She is extremely hypersensitive to the touch on the right arm, even the lightest of touch.  She reports pinprick is symmetric on the right arm compared to that of the left.  She reports decreased pinprick on the right face compared to that of the left.  Vibration is intact, but decreased at the bilateral ankle.  There is no extinction with double simultaneous stimulation. Coordination:  The patient has no difficulty with RAM's or FNF bilaterally. Motor: Strength is 5/5 in the left upper and lower extremities.  She has significant giveaway weakness on the right, particularly the right  upper extremity.  With encouragement, strength is at least 4+/5 in the right upper extremity and at least 5 -/5 in the right lower extremity.  There is no true pronator drift, but she reports that there is pain in the arm with holding the arm out on the right. DTR's: Deep tendon reflexes are 2-/4 at the left biceps, triceps, brachioradialis, 1/4 at the bilateral patella and achilles.  Plantar responses are downgoing bilaterally.  She refuses to let me do deep tendon reflexes in the right arm because of pain and hyperesthesia. Gait and Station: The patient is able to ambulate without difficulty. The patient is able to heel toe walk without any difficulty. The patient is able to ambulate in a tandem fashion. The patient is able to stand in the Romberg position.  Lab Results  Component Value Date   LDLCALC 117 (H) 03/26/2014     IMPRESSION/PLAN  1. R sided weakness and paresthesias  -pts history certainly sounds convincing for a potential neurologic event (off of coumadin for a colonscopy and had R sided paresthesias after coumadin just restarted).  However, her examination is non physiologicWith giveaway weakness and full of R arm pain, which in the absence of a thalamic infarct would be very unusual.  Regardless, her coumadin has been restarted and is therapeutic.  Will do cartoid u/s and echo.  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  Neck pain  -will do MRI cervical spine  -if neg, could consider mri right shoulder and will leave that to the discretion of her primary care physician if her cervical spine is negative.  -Told the patient that it is too early to do EMG  3.  OSAS  -needs cpap titration and will order.  4.  We did discuss signs and symptoms of stroke and the importance of going to  the emergency room immediately and calling 911 should she experience any of these.  5.  Greater than 50% of this 60 minute  visit was spent in counseling with the patient and her husband.   Cc:  [name], MD

## 2015-12-23 NOTE — Patient Instructions (Signed)
1. We have you scheduled for your Carotid Doppler on 12/27/15 at 2:00 pm. We have also scheduled you for your Echocardiogram on 01/06/16 at 9:00 am. Please arrive 15 minutes prior and go to [facility], section A. If this is not a good date/time please call [phone] to reschedule.   2. Your provider has requested that you have labwork completed today. Please go to [facility] ([phone]) on the second floor of this building before leaving the office today. You do not need to check in. If you are not called within 15 minutes please check with the front desk.   3. We have scheduled you for your sleep study at the [facility] located at [place] on 02/07/16 at 8:00 pm. They will mail a packet of information to you regarding the testing. If this is not a good date/time you can call [phone] to reschedule.   4. We have sent a referral to [facility] for your MRI cervical spine and CT Angio of your head.  They will call you directly to schedule your appt. They are located at [place]. If you need to reschedule for any reason please call [phone] option [phone].

## 2015-12-27 ENCOUNTER — Encounter (HOSPITAL_COMMUNITY): Payer: Self-pay | Admitting: Neurology

## 2015-12-27 ENCOUNTER — Ambulatory Visit (HOSPITAL_COMMUNITY)
Admission: RE | Admit: 2015-12-27 | Discharge: 2015-12-27 | Disposition: A | Discharge status: Home / Self Care | Payer: Managed Care, Other (non HMO) | Source: Ambulatory Visit | Attending: Neurology | Admitting: Neurology

## 2015-12-27 DIAGNOSIS — I671 Cerebral aneurysm, nonruptured: Secondary | ICD-10-CM | POA: Diagnosis not present

## 2015-12-27 DIAGNOSIS — I4891 Unspecified atrial fibrillation: Secondary | ICD-10-CM | POA: Diagnosis not present

## 2015-12-27 NOTE — Progress Notes (Signed)
**  Preliminary report by tech**  Carotid duplex completed. Findings are consistent with a 1-39 percent stenosis involving the right internal carotid artery and the left internal carotid artery. The vertebral arteries demonstrate antegrade flow.  12/27/15 3:12 PM Caitlyn Fuentes RVT

## 2015-12-28 ENCOUNTER — Telehealth: Payer: Self-pay | Admitting: Neurology

## 2015-12-28 LAB — VAS US CAROTID
LCCADDIAS: -33 cm/s
LEFT ECA DIAS: -8 cm/s
LEFT VERTEBRAL DIAS: -23 cm/s
LICADDIAS: -39 cm/s
LICADSYS: -97 cm/s
LICAPSYS: -56 cm/s
Left CCA dist sys: -102 cm/s
Left CCA prox dias: 29 cm/s
Left CCA prox sys: 102 cm/s
Left ICA prox dias: -17 cm/s
RIGHT ECA DIAS: 9 cm/s
RIGHT VERTEBRAL DIAS: -24 cm/s
Right CCA prox dias: -19 cm/s
Right CCA prox sys: -100 cm/s
Right cca dist sys: -92 cm/s

## 2015-12-28 NOTE — Telephone Encounter (Signed)
Patient made aware.

## 2015-12-28 NOTE — Telephone Encounter (Signed)
VM-PT returned your call/Dawn CB# [phone]

## 2015-12-28 NOTE — Telephone Encounter (Signed)
-----   Message from [place], DO sent at 12/28/2015 10:28 AM EDT ----- Let pt know that carotid u/s was normal

## 2015-12-28 NOTE — Telephone Encounter (Signed)
Left message on machine for patient to call back.

## 2016-01-02 ENCOUNTER — Ambulatory Visit (INDEPENDENT_AMBULATORY_CARE_PROVIDER_SITE_OTHER): Payer: Managed Care, Other (non HMO) | Admitting: Pharmacist

## 2016-01-02 DIAGNOSIS — Z7901 Long term (current) use of anticoagulants: Secondary | ICD-10-CM | POA: Diagnosis not present

## 2016-01-02 DIAGNOSIS — I480 Paroxysmal atrial fibrillation: Secondary | ICD-10-CM

## 2016-01-02 LAB — POCT INR: INR: 2.5

## 2016-01-06 ENCOUNTER — Ambulatory Visit (HOSPITAL_COMMUNITY)
Admission: RE | Admit: 2016-01-06 | Discharge: 2016-01-06 | Disposition: A | Discharge status: Home / Self Care | Payer: Managed Care, Other (non HMO) | Source: Ambulatory Visit | Attending: Neurology | Admitting: Neurology

## 2016-01-06 ENCOUNTER — Encounter (HOSPITAL_COMMUNITY): Payer: Self-pay | Admitting: Neurology

## 2016-01-06 ENCOUNTER — Telehealth: Payer: Self-pay | Admitting: Neurology

## 2016-01-06 DIAGNOSIS — I4891 Unspecified atrial fibrillation: Secondary | ICD-10-CM | POA: Diagnosis not present

## 2016-01-06 MED ORDER — PERFLUTREN LIPID MICROSPHERE
1.0000 mL | INTRAVENOUS | Status: AC | PRN
  Administered 2016-01-06: 2 mL via INTRAVENOUS
  Filled 2016-01-06: qty 10

## 2016-01-06 NOTE — Telephone Encounter (Signed)
Patient made aware. Copy sent to Dr. Maudie Mercury.

## 2016-01-06 NOTE — Telephone Encounter (Signed)
-----   Message from [place], DO sent at 01/06/2016 12:50 PM EDT ----- Let pt know that overall echo looked okay and didn't show [place] blood clots or anything.  Make sure that PCP has copy

## 2016-01-06 NOTE — Progress Notes (Signed)
  Echocardiogram 2D Echocardiogram with Definity has been performed.  Caitlyn Fuentes 01/06/2016, 10:18 AM

## 2016-01-30 ENCOUNTER — Ambulatory Visit (INDEPENDENT_AMBULATORY_CARE_PROVIDER_SITE_OTHER): Payer: Managed Care, Other (non HMO) | Admitting: Pharmacist Clinician (PhC)/ Clinical Pharmacy Specialist

## 2016-01-30 DIAGNOSIS — I480 Paroxysmal atrial fibrillation: Secondary | ICD-10-CM

## 2016-01-30 DIAGNOSIS — Z7901 Long term (current) use of anticoagulants: Secondary | ICD-10-CM | POA: Diagnosis not present

## 2016-01-30 LAB — POCT INR: INR: 1.5

## 2016-02-07 ENCOUNTER — Ambulatory Visit (HOSPITAL_BASED_OUTPATIENT_CLINIC_OR_DEPARTMENT_OTHER): Payer: Managed Care, Other (non HMO) | Attending: Neurology | Admitting: Internal Medicine

## 2016-02-07 VITALS — Ht 64.0 in | Wt 259.0 lb

## 2016-02-07 DIAGNOSIS — G4733 Obstructive sleep apnea (adult) (pediatric): Secondary | ICD-10-CM | POA: Insufficient documentation

## 2016-02-13 ENCOUNTER — Ambulatory Visit (INDEPENDENT_AMBULATORY_CARE_PROVIDER_SITE_OTHER): Payer: Managed Care, Other (non HMO) | Admitting: Pharmacist

## 2016-02-13 DIAGNOSIS — Z7901 Long term (current) use of anticoagulants: Secondary | ICD-10-CM

## 2016-02-13 DIAGNOSIS — I480 Paroxysmal atrial fibrillation: Secondary | ICD-10-CM

## 2016-02-13 LAB — POCT INR: INR: 2.6

## 2016-02-17 DIAGNOSIS — G4733 Obstructive sleep apnea (adult) (pediatric): Secondary | ICD-10-CM | POA: Diagnosis not present

## 2016-02-25 ENCOUNTER — Telehealth: Payer: Self-pay | Admitting: Neurology

## 2016-02-25 DIAGNOSIS — G4733 Obstructive sleep apnea (adult) (pediatric): Secondary | ICD-10-CM | POA: Diagnosis not present

## 2016-02-25 NOTE — Procedures (Signed)
  Patient Name: Caitlyn Fuentes, Caitlyn Fuentes Date: 02/07/2016 Gender: Female D.O.B: 05-03-1955 Age (years): 60 Referring Provider: Wells Guiles Tat Height (inches): 12 Interpreting Physician: Baird Lyons MD, ABSM Weight (lbs): 259 RPSGT: Carolin Coy BMI: 11 MRN: [id number] Neck Size: 15.50 CLINICAL INFORMATION Sleep Study Type: NPSG Indication for sleep study: Congestive Heart Failure, Excessive Daytime Sleepiness, Fatigue, Hypertension, Obesity, OSA, Snoring [place] Sleepiness Score: 16  Most recent polysomnogram dated 09/07/2011 revealed an AHI of 11.2/h and RDI of 18.9/h. Most recent titration study dated 09/07/2011 was optimal at 11cm H2O with an AHI of 0.61/h.  SLEEP STUDY TECHNIQUE As per the AASM Manual for the Scoring of Sleep and Associated Events v2.3 (April 2016) with a hypopnea requiring 4% desaturations. The channels recorded and monitored were frontal, central and occipital EEG, electrooculogram (EOG), submentalis EMG (chin), nasal and oral airflow, thoracic and abdominal wall motion, anterior tibialis EMG, snore microphone, electrocardiogram, and pulse oximetry.  MEDICATIONS Medications self-administered by patient taken the night of the study : ALLOPURINOL, LIPITOR, COREG cr, hydralazine, WARFARIN  SLEEP ARCHITECTURE The study was initiated at 11:04:29 PM and ended at 5:00:19 AM. Sleep onset time was 39.7 minutes and the sleep efficiency was 59.0%. The total sleep time was 210.0 minutes. Stage REM latency was 226.0 minutes. The patient spent 40.24% of the night in stage N1 sleep, 57.38% in stage N2 sleep, 0.00% in stage N3 and 2.38% in REM. Alpha intrusion was absent. Supine sleep was 20.28%. Wake after sleep onset 106 minutes  RESPIRATORY PARAMETERS The overall apnea/hypopnea index (AHI) was 33.7 per hour. There were 0 total apneas, including 0 obstructive, 0 central and 0 mixed apneas. There were 118 hypopneas and 152 RERAs. The AHI during Stage REM sleep was 48.0  per hour. AHI while supine was 49.3 per hour. The mean oxygen saturation was 93.06%. The minimum SpO2 during sleep was 86.00%. Moderate snoring was noted during this study.  CARDIAC DATA The 2 lead EKG demonstrated sinus rhythm. The mean heart rate was 70.89 beats per minute. Other EKG findings include: PVCs.  LEG MOVEMENT DATA The total PLMS were 0 with a resulting PLMS index of 0.00. Associated arousal with leg movement index was 0.0 .  IMPRESSIONS - Severe obstructive sleep apnea occurred during this study (AHI = 33.7/h). - Insufficient early events to meet protocol requirements for split CPAP titration. - No significant central sleep apnea occurred during this study (CAI = 0.0/h). - Mild oxygen desaturation was noted during this study (Min O2 = 86.00%). - The patient snored with Moderate snoring volume. - EKG findings include PVCs. - Clinically significant periodic limb movements did not occur during sleep. No significant associated arousals.  DIAGNOSIS - Obstructive Sleep Apnea (327.23 [G47.33 ICD-10])  RECOMMENDATIONS - Therapeutic CPAP titration to determine optimal pressure required to alleviate sleep disordered breathing. - Positional therapy avoiding supine position during sleep. - Avoid alcohol, sedatives and other CNS depressants that may worsen sleep apnea and disrupt normal sleep architecture. - Sleep hygiene should be reviewed to assess factors that may improve sleep quality. - Weight management and regular exercise should be initiated or continued if appropriate.  [Electronically signed] 02/25/2016 12:36 PM  Baird Lyons MD, [place], American Board of Sleep Medicine   NPI: [id number]  [place], [place] of Sleep [facility]  ELECTRONICALLY SIGNED ON:  02/25/2016, 12:36 PM [place] PH: [phone]   FX: [phone] [place]

## 2016-02-25 NOTE — Telephone Encounter (Signed)
Caitlyn Fuentes, let pt know that even though split night study was order to determine her proper pressure, they couldn't do that b/c she didn't have enough apneic event early in the night and she will need to go back for CPAP titration.  Also, what happened to MRI cervical spine and CTA?  Says "exam ended" but no report.

## 2016-02-27 NOTE — Telephone Encounter (Signed)
Left message on machine for patient to call back.

## 2016-02-27 NOTE — Telephone Encounter (Signed)
MR/CT was cancelled and never rescheduled with [facility]. They will contact the patient to try to reschedule testing.

## 2016-02-28 ENCOUNTER — Emergency Department (HOSPITAL_BASED_OUTPATIENT_CLINIC_OR_DEPARTMENT_OTHER): Payer: Managed Care, Other (non HMO)

## 2016-02-28 ENCOUNTER — Emergency Department (HOSPITAL_BASED_OUTPATIENT_CLINIC_OR_DEPARTMENT_OTHER)
Admission: EM | Admit: 2016-02-28 | Discharge: 2016-02-28 | Disposition: A | Discharge status: Home / Self Care | Payer: Managed Care, Other (non HMO) | Attending: Emergency Medicine | Admitting: Emergency Medicine

## 2016-02-28 ENCOUNTER — Encounter (HOSPITAL_BASED_OUTPATIENT_CLINIC_OR_DEPARTMENT_OTHER): Payer: Self-pay | Admitting: *Deleted

## 2016-02-28 DIAGNOSIS — Z87891 Personal history of nicotine dependence: Secondary | ICD-10-CM | POA: Insufficient documentation

## 2016-02-28 DIAGNOSIS — Z7901 Long term (current) use of anticoagulants: Secondary | ICD-10-CM | POA: Insufficient documentation

## 2016-02-28 DIAGNOSIS — I110 Hypertensive heart disease with heart failure: Secondary | ICD-10-CM | POA: Diagnosis not present

## 2016-02-28 DIAGNOSIS — M7989 Other specified soft tissue disorders: Secondary | ICD-10-CM | POA: Insufficient documentation

## 2016-02-28 DIAGNOSIS — I5023 Acute on chronic systolic (congestive) heart failure: Secondary | ICD-10-CM | POA: Diagnosis not present

## 2016-02-28 DIAGNOSIS — M79671 Pain in right foot: Secondary | ICD-10-CM | POA: Diagnosis present

## 2016-02-28 MED ORDER — PREDNISONE 20 MG PO TABS: ORAL_TABLET | ORAL | 0 refills | Status: DC

## 2016-02-28 MED ORDER — PREDNISONE 50 MG PO TABS
60.0000 mg | ORAL_TABLET | Freq: Once | ORAL | Status: AC
  Administered 2016-02-28: 60 mg via ORAL
  Filled 2016-02-28: qty 1

## 2016-02-28 MED ORDER — OXYCODONE HCL 5 MG PO TABS: 5.0000 mg | ORAL_TABLET | ORAL | 0 refills | Status: DC | PRN

## 2016-02-28 MED FILL — predniSONE 20 MG TABS: 20 | 4 days supply | Qty: 12 | Fill #0

## 2016-02-28 MED FILL — oxyCODONE HCL 5 MG TABS: 5 | 1 days supply | Qty: 4 | Fill #0

## 2016-02-28 NOTE — ED Provider Notes (Signed)
[facility] Provider Note   CSN: [id number] Arrival date & time: 02/28/16  1333     History   Chief Complaint Chief Complaint  Patient presents with  . Foot Pain    HPI Caitlyn Fuentes is a 60 y.o. female.  The history is provided by the patient.  Foot Pain  This is a new problem. Episode onset: 3 days ago. The problem occurs constantly. The problem has not changed since onset.Associated symptoms comments: Swollen, red around ankle with improving features, called PCP today and referred to ED. Nothing aggravates the symptoms. Nothing relieves the symptoms. She has tried nothing for the symptoms.    Past Medical History:  Diagnosis Date  . A-fib ([place])   . Cardiomyopathy   . Hyperlipidemia   . Hypertension   . Lupus   . Pulmonary hypertension 05/10/2011   Echo, EF-40-45  . Renal disorder   . Sleep apnea 2008   CPAP, pt does not know settings  . Thyroid disease     Patient Active Problem List   Diagnosis Date Noted  . Nonischemic cardiomyopathy ([place]) 05/25/2015  . Acute on chronic systolic heart failure, NYHA class 2 ([place]) 05/25/2015  . Palpitations 05/25/2015  . Gout 10/16/2013  . Hyperlipidemia 10/16/2013  . Long term current use of anticoagulant therapy 07/20/2013  . Chest pain, atypical 08/16/2011  . Morbid obesity ([place]) 08/16/2011  . Bilateral lower extremity edema 08/16/2011  . Obstructive sleep apnea on CPAP 08/16/2011  . Paroxysmal atrial fibrillation ([place]) 08/16/2011    Past Surgical History:  Procedure Laterality Date  .  c sections  x 2  . ABDOMINAL HYSTERECTOMY     complete  . CARDIAC CATHETERIZATION     Nonischemic, EF-40, continued medical therapy  . CARDIAC CATHETERIZATION  10/02/2006   No significant CAD  . COLONOSCOPY WITH PROPOFOL N/A 11/22/2015   Procedure: COLONOSCOPY WITH PROPOFOL;  Surgeon: Juanita Craver, MD;  Location: [facility];  Service: Endoscopy;  Laterality: N/A;  . ESOPHAGOGASTRODUODENOSCOPY (EGD) WITH PROPOFOL N/A  11/22/2015   Procedure: ESOPHAGOGASTRODUODENOSCOPY (EGD) WITH PROPOFOL;  Surgeon: Juanita Craver, MD;  Location: [facility];  Service: Endoscopy;  Laterality: N/A;    OB History    No data available       Home Medications    Prior to Admission medications   Medication Sig Start Date End Date Taking? Authorizing Provider  allopurinol (ZYLOPRIM) 100 MG tablet Take 200 mg by mouth daily.  07/09/13   Historical Provider, MD  atorvastatin (LIPITOR) 40 MG tablet TAKE 1 TABLET (40 MG TOTAL) BY MOUTH DAILY. 12/21/15   Troy Sine, MD  celecoxib (CELEBREX) 200 MG capsule Take 200 mg by mouth as needed for mild pain.  03/16/15   Historical Provider, MD  colchicine 0.6 MG tablet Take 0.6 mg by mouth daily.    Historical Provider, MD  COREG CR 80 MG 24 hr capsule TAKE 1 CAPSULE (80 MG TOTAL) BY MOUTH DAILY. 10/13/15   Troy Sine, MD  estradiol (ESTRACE) 2 MG tablet Take 2 mg by mouth daily.  03/22/14   Historical Provider, MD  hydrALAZINE (APRESOLINE) 25 MG tablet TAKE 1 TABLET (25 MG TOTAL) BY MOUTH 2 (TWO) TIMES DAILY. 10/12/15   Troy Sine, MD  levothyroxine (SYNTHROID, LEVOTHROID) 25 MCG tablet Take 25 mcg by mouth daily. 07/14/14   Historical Provider, MD  Multiple Vitamin (MULITIVITAMIN WITH MINERALS) TABS Take 1 tablet by mouth daily.    Historical Provider, MD  omega-3 acid ethyl esters (LOVAZA) 1  g capsule Take 1 g by mouth daily.    Historical Provider, MD  torsemide (DEMADEX) 20 MG tablet Take 1 tablet (20 mg total) by mouth 2 (two) times daily. Patient taking differently: Take 40 mg by mouth 2 (two) times daily.  10/31/15   Troy Sine, MD  warfarin (COUMADIN) 5 MG tablet TAKE 1-1.5 TABLETS BY MOUTH DAILY AS DIRECTED BY COUMADIN CLINIC 11/01/15   Historical Provider, MD  zolpidem (AMBIEN) 10 MG tablet Take 10 mg by mouth at bedtime as needed for sleep.     Historical Provider, MD    Family History Family History  Problem Relation Age of Onset  . Heart disease Mother   . Heart  disease Father   . Lung disease Sister   . Heart disease Brother   . Hypertension Sister     Social History Social History  Substance Use Topics  . Smoking status: Former Smoker    Quit date: 12/25/2008  . Smokeless tobacco: Never Used  . Alcohol use 0.0 oz/week     Comment: once a month     Allergies   Diovan [valsartan] and Lisinopril   Review of Systems Review of Systems  All other systems reviewed and are negative.    Physical Exam Updated Vital Signs BP 139/64 (BP Location: Left Arm)   Pulse 78   Temp 97.9 F (36.6 C) (Oral)   Resp 18   Ht 5' 3.5" (1.613 m)   Wt 264 lb (119.7 kg)   SpO2 98%   BMI 46.03 kg/m   Physical Exam  Constitutional: She is oriented to person, place, and time. She appears well-developed and well-nourished. No distress.  HENT:  Head: Normocephalic.  Nose: Nose normal.  Eyes: Conjunctivae are normal.  Neck: Neck supple. No tracheal deviation present.  Cardiovascular: Normal rate and regular rhythm.   Pulmonary/Chest: Effort normal. No respiratory distress.  Abdominal: Soft. She exhibits no distension.  Musculoskeletal:  Right ankle and foot 3+ non-pitting edema without erythema. Mild right calf tenderness. No pain with joint pain with movement, direct pressure over soft tissue swelling reproduces pain.  Neurological: She is alert and oriented to person, place, and time.  Skin: Skin is warm and dry.  Psychiatric: She has a normal mood and affect.     ED Treatments / Results  Labs (all labs ordered are listed, but only abnormal results are displayed) Labs Reviewed - No data to display  EKG  EKG Interpretation None       Radiology US Venous Img Lower Unilateral Right  Result Date: 02/28/2016 CLINICAL DATA:  60 year old female with a history of leg swelling EXAM: RIGHT LOWER EXTREMITY VENOUS DOPPLER ULTRASOUND TECHNIQUE: Gray-scale sonography with graded compression, as well as color Doppler and duplex ultrasound were  performed to evaluate the lower extremity deep venous systems from the level of the common femoral vein and including the common femoral, femoral, profunda femoral, popliteal and calf veins including the posterior tibial, peroneal and gastrocnemius veins when visible. The superficial great saphenous vein was also interrogated. Spectral Doppler was utilized to evaluate flow at rest and with distal augmentation maneuvers in the common femoral, femoral and popliteal veins. COMPARISON:  None. FINDINGS: Contralateral Common Femoral Vein: Respiratory phasicity is normal and symmetric with the symptomatic side. No evidence of thrombus. Normal compressibility. Common Femoral Vein: No evidence of thrombus. Normal compressibility, respiratory phasicity and response to augmentation. Saphenofemoral Junction: No evidence of thrombus. Normal compressibility and flow on color Doppler imaging. Profunda Femoral Vein: No  evidence of thrombus. Normal compressibility and flow on color Doppler imaging. Femoral Vein: No evidence of thrombus. Normal compressibility, respiratory phasicity and response to augmentation. Popliteal Vein: No evidence of thrombus. Normal compressibility, respiratory phasicity and response to augmentation. Calf Veins: Peroneal vein not well visualized. No DVT of the posterior tibial vein. Superficial Great Saphenous Vein: No evidence of thrombus. Normal compressibility and flow on color Doppler imaging. Other Findings:  None. IMPRESSION: Sonographic survey of the right lower extremity negative for DVT. Signed, Dulcy Fanny. Earleen Newport, DO [facility] Radiology Electronically Signed   By: Corrie Mckusick D.O.   On: 02/28/2016 15:11   Dg Foot Complete Right  Result Date: 02/28/2016 CLINICAL DATA:  Foot pain and swelling, no known injury, history of gout, initial encounter EXAM: RIGHT FOOT COMPLETE - 3+ VIEW COMPARISON:  None. FINDINGS: Mild hallux valgus deformity is noted.  No acute fracture or dislocation is noted. Diffuse soft tissue swelling is noted over the mid metatarsals. IMPRESSION: Soft tissue swelling without acute bony abnormality. Electronically Signed   By: Inez Catalina M.D.   On: 02/28/2016 14:14    Procedures Procedures (including critical care time)  Medications Ordered in ED Medications - No data to display   Initial Impression / Assessment and Plan / ED Course  I have reviewed the triage vital signs and the nursing notes.  Pertinent labs & imaging results that were available during my care of the patient were reviewed by me and considered in my medical decision making (see chart for details).  Clinical Course     60 y.o. female presents with right ankle and foot swelling with pain over right calf starting 2 days ago. Progressed from ankle and spread diffusely. No erythema or warmth of leg. [place] to r/o DVT shows no acute DVT. Plain film with only soft tissue swelling. Do not suspect gout with atypical appearance clinically. Pt with h/o lupus and this is possibly lupus flare. Will treat with steroids and pain control at home. Recommended PCP fu and evaluation by her rheumatologist. May need repeat DVT study is symptoms progress.  Final Clinical Impressions(s) / ED Diagnoses   Final diagnoses:  Leg swelling    New Prescriptions Discharge Medication List as of 02/28/2016  3:16 PM    START taking these medications   Details  oxyCODONE (ROXICODONE) 5 MG immediate release tablet Take 1 tablet (5 mg total) by mouth every 4 (four) hours as needed for severe pain., Starting Tue 02/28/2016, Print    predniSONE (DELTASONE) 20 MG tablet 3 tabs po daily x 4 days, Print         Leo Grosser, MD 02/28/16 [phone]

## 2016-02-28 NOTE — Telephone Encounter (Signed)
Patient made aware of need for CPAP titration.   Called sleep center and they will set this up with patient.  She has r/s her previously cancelled imaging for Monday.

## 2016-02-28 NOTE — Addendum Note (Signed)
Addended byAnnamaria Helling on: 02/28/2016 08:07 AM   Modules accepted: Orders

## 2016-02-28 NOTE — Discharge Instructions (Signed)
Follow up with your rheumatologist.

## 2016-02-28 NOTE — ED Triage Notes (Signed)
Right foot pain x 3 days. She has a hx of gout. This pain started around her ankle and now has localized to the top of her foot and her toes.

## 2016-03-09 ENCOUNTER — Telehealth: Payer: Self-pay | Admitting: Neurology

## 2016-03-09 NOTE — Telephone Encounter (Signed)
Called to check on status of imaging. Patient cancelled her MRI/CTA again with [facility]- she told them on 03/05/16 that she "has had similar scans before and doesn't need it".

## 2016-03-31 ENCOUNTER — Other Ambulatory Visit: Payer: Self-pay | Admitting: Cardiovascular Disease

## 2016-04-12 ENCOUNTER — Ambulatory Visit (HOSPITAL_BASED_OUTPATIENT_CLINIC_OR_DEPARTMENT_OTHER): Payer: Managed Care, Other (non HMO) | Attending: Neurology | Admitting: Internal Medicine

## 2016-04-12 DIAGNOSIS — G4733 Obstructive sleep apnea (adult) (pediatric): Secondary | ICD-10-CM

## 2016-04-22 DIAGNOSIS — G4733 Obstructive sleep apnea (adult) (pediatric): Secondary | ICD-10-CM | POA: Diagnosis not present

## 2016-04-22 NOTE — Procedures (Signed)
  Patient Name: Caitlyn Fuentes, Caitlyn Fuentes Date: 04/12/2016 Gender: Female D.O.B: 04/15/1956 Age (years): 36 Referring Provider: Wells Guiles Tat Height (inches): 64 Interpreting Physician: Baird Lyons MD, ABSM Weight (lbs): 259 RPSGT: Zadie Rhine BMI: 44 MRN: [id number] Neck Size: 15.50 CLINICAL INFORMATION  The patient is referred for a CPAP titration to treat sleep apnea.   Date of NPSG, Split Night or HST:  Diagnostic NPSG 02/07/16  AHI 33.7/ hr, body weight 259 lbs, desaturation to 86 %  SLEEP STUDY TECHNIQUE As per the AASM Manual for the Scoring of Sleep and Associated Events v2.3 (April 2016) with a hypopnea requiring 4% desaturations.  The channels recorded and monitored were frontal, central and occipital EEG, electrooculogram (EOG), submentalis EMG (chin), nasal and oral airflow, thoracic and abdominal wall motion, anterior tibialis EMG, snore microphone, electrocardiogram, and pulse oximetry. Continuous positive airway pressure (CPAP) was initiated at the beginning of the study and titrated to treat sleep-disordered breathing.  MEDICATIONS Medications self-administered by patient taken the night of the study : ALLOPURINOL, LIPITOR, COREG cr, hydralazine, WARFARIN  TECHNICIAN COMMENTS Comments added by technician: NO RESTROOM VISTED. Patient had difficulty initiating sleep.  Comments added by scorer: N/A RESPIRATORY PARAMETERS Optimal PAP Pressure (cm): 9 AHI at Optimal Pressure (/hr): 0.0 Overall Minimal O2 (%): 88.00 Supine % at Optimal Pressure (%): 92 Minimal O2 at Optimal Pressure (%): 88.0   SLEEP ARCHITECTURE The study was initiated at 10:41:14 PM and ended at 4:43:31 AM.  Sleep onset time was 26.7 minutes and the sleep efficiency was 59.5%. The total sleep time was 215.5 minutes.  The patient spent 6.26% of the night in stage N1 sleep, 43.62% in stage N2 sleep, 12.06% in stage N3 and 38.05% in REM.Stage REM latency was 44.0 minutes  Wake after sleep onset  was 120.1. Alpha intrusion was absent. Supine sleep was 66.92%.  CARDIAC DATA The 2 lead EKG demonstrated sinus rhythm. The mean heart rate was 76.39 beats per minute. Other EKG findings include: PVCs.  LEG MOVEMENT DATA The total Periodic Limb Movements of Sleep (PLMS) were 0. The PLMS index was 0.00. A PLMS index of <15 is considered normal in adults.  IMPRESSIONS - The optimal PAP pressure was 9 cm of water. - Central sleep apnea was not noted during this titration (CAI = 0.0/h). - Mild oxygen desaturations were observed during this titration (min O2 = 88.00%). - No snoring was audible during this study. - 2-lead EKG demonstrated: PVCs - Clinically significant periodic limb movements were not noted during this study. Arousals associated with PLMs were rare.  DIAGNOSIS - Obstructive Sleep Apnea (327.23 [G47.33 ICD-10])  RECOMMENDATIONS - Trial of CPAP therapy on 9 cm H2O with a Standard size Fisher&Paykel Nasal Pillow Mask [place] Q mask and heated humidification. - Avoid alcohol, sedatives and other CNS depressants that may worsen sleep apnea and disrupt normal sleep architecture. - Sleep hygiene should be reviewed to assess factors that may improve sleep quality. - Weight management and regular exercise should be initiated or continued.  [Electronically signed] 04/22/2016 12:17 PM  Baird Lyons MD, [place], American Board of Sleep Medicine   NPI: [id number]  [place], American Board of Sleep Medicine  ELECTRONICALLY SIGNED ON:  04/22/2016, 12:15 PM [place] PH: [phone]   FX: [phone] [place]

## 2016-04-24 ENCOUNTER — Telehealth: Payer: Self-pay | Admitting: Neurology

## 2016-04-24 DIAGNOSIS — G4733 Obstructive sleep apnea (adult) (pediatric): Secondary | ICD-10-CM

## 2016-04-24 NOTE — Telephone Encounter (Signed)
CPAP order entered and note sent to [facility].

## 2016-04-24 NOTE — Telephone Encounter (Signed)
-----   Message from [place], DO sent at 04/24/2016  7:59 AM EST ----- Order CPAP at 9 with heated humidifier

## 2016-06-08 ENCOUNTER — Other Ambulatory Visit: Payer: Self-pay | Admitting: *Deleted

## 2016-06-08 MED ORDER — TORSEMIDE 20 MG PO TABS: 20.0000 mg | ORAL_TABLET | Freq: Two times a day (BID) | ORAL | 0 refills | Status: DC

## 2016-06-08 NOTE — Telephone Encounter (Signed)
Patient called and stated that Dr Claiborne Billings increased her torsemide to 40 mg bid. She needs a new [facility] [address]. Patient can be reached at [phone]. Thanks, MI

## 2016-06-08 NOTE — Telephone Encounter (Signed)
Rx(s) sent to pharmacy electronically.  

## 2016-06-12 ENCOUNTER — Other Ambulatory Visit: Payer: Self-pay | Admitting: Cardiovascular Disease

## 2016-06-13 NOTE — Telephone Encounter (Signed)
[facility] for patient to schedule INR, last was Oct 2017

## 2016-06-18 NOTE — Telephone Encounter (Signed)
LMOM ; patient needs f/u INR appointment ASAP.

## 2016-06-25 NOTE — Telephone Encounter (Signed)
LMOM; Need f/u INR before warfarin refill authorized.  Last INR done was Oct/2017.  Rx for #7 warfarin tablets sent to pharmacy to allow few days until able to re-schedule appointment.

## 2016-07-02 ENCOUNTER — Other Ambulatory Visit: Payer: Self-pay | Admitting: Cardiovascular Disease

## 2016-07-23 ENCOUNTER — Telehealth: Payer: Self-pay | Admitting: Neurology

## 2016-07-23 ENCOUNTER — Ambulatory Visit (INDEPENDENT_AMBULATORY_CARE_PROVIDER_SITE_OTHER): Payer: 59 | Admitting: Cardiovascular Disease

## 2016-07-23 ENCOUNTER — Ambulatory Visit (INDEPENDENT_AMBULATORY_CARE_PROVIDER_SITE_OTHER): Payer: 59 | Admitting: Pharmacist Clinician (PhC)/ Clinical Pharmacy Specialist

## 2016-07-23 ENCOUNTER — Encounter: Payer: Self-pay | Admitting: Cardiovascular Disease

## 2016-07-23 VITALS — BP 138/76 | HR 77 | Ht 63.5 in | Wt 268.0 lb

## 2016-07-23 DIAGNOSIS — E7800 Pure hypercholesterolemia, unspecified: Secondary | ICD-10-CM

## 2016-07-23 DIAGNOSIS — Z5181 Encounter for therapeutic drug level monitoring: Secondary | ICD-10-CM | POA: Diagnosis not present

## 2016-07-23 DIAGNOSIS — I480 Paroxysmal atrial fibrillation: Secondary | ICD-10-CM

## 2016-07-23 DIAGNOSIS — I428 Other cardiomyopathies: Secondary | ICD-10-CM | POA: Diagnosis not present

## 2016-07-23 DIAGNOSIS — G4733 Obstructive sleep apnea (adult) (pediatric): Secondary | ICD-10-CM

## 2016-07-23 DIAGNOSIS — Z7901 Long term (current) use of anticoagulants: Secondary | ICD-10-CM | POA: Diagnosis not present

## 2016-07-23 DIAGNOSIS — I5022 Chronic systolic (congestive) heart failure: Secondary | ICD-10-CM

## 2016-07-23 DIAGNOSIS — E039 Hypothyroidism, unspecified: Secondary | ICD-10-CM | POA: Diagnosis not present

## 2016-07-23 DIAGNOSIS — R600 Localized edema: Secondary | ICD-10-CM | POA: Diagnosis not present

## 2016-07-23 MED ORDER — CARVEDILOL 12.5 MG PO TABS: 12.5000 mg | ORAL_TABLET | Freq: Two times a day (BID) | ORAL | 3 refills | Status: DC

## 2016-07-23 MED ORDER — TORSEMIDE 20 MG PO TABS: 20.0000 mg | ORAL_TABLET | Freq: Two times a day (BID) | ORAL | 3 refills | Status: DC

## 2016-07-23 MED ORDER — APIXABAN 5 MG PO TABS: 5.0000 mg | ORAL_TABLET | Freq: Two times a day (BID) | ORAL | 3 refills | Status: DC

## 2016-07-23 NOTE — Telephone Encounter (Signed)
I have no objection if Dr. Claiborne Billings would like to manage that.  As for CTA brain - she was seen on 12/23/15.  We ordered then and it looks like she cx but there is also a section under imaging that says that this was completed on 12/23/15 but no report.  If not please let pt know that I recommend it to make sure that there is no aneurysm

## 2016-07-23 NOTE — Patient Instructions (Addendum)
Medication Instructions:   Start carvedilol at 12.5mg  twice daily Resume torsemide at 40mg  daily Stop coumadin and start Eliquis 5mg  twice daily. A prescription has been sent to your pharmacy.  Labwork: none   Testing/Procedures: none   Follow-Up: 2-3 months   If you need a refill on your cardiac medications before your next appointment, please call your pharmacy.

## 2016-07-23 NOTE — Progress Notes (Signed)
Patient ID: Caitlyn Fuentes, female   DOB: 1955-09-07, 61 y.o.   MRN: [id number]     HPI: Caitlyn Fuentes is a 61 y.o. female who presents to the office today for a 9 month follow up cardiology evaluation.  Caitlyn Fuentes  initially presented with atrial fibrillation in 2008 was found to have a nonischemic cardiomyopathy. Cardiac catheterization did not show significant coronary obstructive disease and she was found to have mild pulmonary hypertension.   She has obstructive sleep apnea documented since 2008 and has been using CPAP.  She had undergone a follow-up sleep study in 2013, but never follow-up with getting a new CPAP machine.  She uses [employer] for her DME company.   Additional problems include with obesity, hypertension, mild renal insufficiency, as well as lower extremity edema.  There also is a history of lupus as well as gout.  She has lower extremity venous reflux disease with deep venous insufficiency within the left common femoral vein and right and left greater saphenous veins have demonstrated valvular insufficiency, as well as the left short saphenous vein.    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  Since I last saw her in July 2017, she is unaware of any recurrent atrial fibrillation.  He states that she had seen a neurologist.  She had not been using CPAP due to her machine malfunction.  He referred her for a another sleep study, which was interpreted in October 2017 by Dr. Annamaria Fuentes which showed severe sleep apnea with an AHI of 33.7.  AHI during rems sleep was 48.  She dropped her oxygen to 86% and there was moderate snoring.  She subsequently underwent a CPAP titration was felt that her optimal CPAP pressure was 9 cm.  She is never seen Dr. Annamaria Fuentes.  It has not yet received her new machine.  Since I taking care.  The patient sleep apnea in the past, the patient requested I  continue to take care of her sleep apnea.  She's run out of several of her medications and particularly ran out of torsemide several weeks ago.  In addition to warfarin.  She admits to some vague episodes of chest discomfort while lying down but denies any exertional precipitation of chest pain.  She presents for evaluation.   Past Medical History:  Diagnosis Date  . A-fib ([place])   . Cardiomyopathy   . Hyperlipidemia   . Hypertension   . Lupus   . Pulmonary hypertension 05/10/2011   Echo, EF-40-45  . Renal disorder   . Sleep apnea 2008   CPAP, pt does not know settings  . Thyroid disease     Past Surgical History:  Procedure Laterality Date  .  c sections  x 2  . ABDOMINAL HYSTERECTOMY     complete  . CARDIAC CATHETERIZATION     Nonischemic, EF-40, continued medical therapy  . CARDIAC CATHETERIZATION  10/02/2006   No significant CAD  . COLONOSCOPY WITH PROPOFOL N/A 11/22/2015   Procedure: COLONOSCOPY WITH PROPOFOL;  Surgeon: Juanita Craver, MD;  Location: [facility];  Service: Endoscopy;  Laterality: N/A;  . ESOPHAGOGASTRODUODENOSCOPY (EGD) WITH PROPOFOL N/A 11/22/2015   Procedure: ESOPHAGOGASTRODUODENOSCOPY (EGD) WITH PROPOFOL;  Surgeon: Juanita Craver, MD;  Location: [facility];  Service: Endoscopy;  Laterality: N/A;    Allergies  Allergen Reactions  . Diovan [Valsartan] Other (See Comments)    Messes up kidneys  . Lisinopril Swelling  Facial swelling    Current Outpatient Prescriptions  Medication Sig Dispense Refill  . allopurinol (ZYLOPRIM) 100 MG tablet Take 200 mg by mouth daily.     Marland Kitchen atorvastatin (LIPITOR) 40 MG tablet TAKE 1 TABLET (40 MG TOTAL) BY MOUTH DAILY. 90 tablet 2  . celecoxib (CELEBREX) 200 MG capsule Take 200 mg by mouth as needed for mild pain.   0  . colchicine 0.6 MG tablet Take 0.6 mg by mouth daily.    Marland Kitchen estradiol (ESTRACE) 2 MG tablet Take 2 mg by mouth daily.     . hydrALAZINE (APRESOLINE) 25 MG tablet TAKE 1 TABLET (25 MG TOTAL) BY MOUTH 2 (TWO)  TIMES DAILY. 180 tablet 2  . levothyroxine (SYNTHROID, LEVOTHROID) 25 MCG tablet Take 25 mcg by mouth daily.  4  . Multiple Vitamin (MULITIVITAMIN WITH MINERALS) TABS Take 1 tablet by mouth daily.    Marland Kitchen omega-3 acid ethyl esters (LOVAZA) 1 g capsule Take 1 g by mouth daily.    Marland Kitchen oxyCODONE (ROXICODONE) 5 MG immediate release tablet Take 1 tablet (5 mg total) by mouth every 4 (four) hours as needed for severe pain. 4 tablet 0  . zolpidem (AMBIEN) 10 MG tablet Take 10 mg by mouth at bedtime as needed for sleep.     Marland Kitchen apixaban (ELIQUIS) 5 MG TABS tablet Take 1 tablet (5 mg total) by mouth 2 (two) times daily. 180 tablet 3  . carvedilol (COREG) 12.5 MG tablet Take 1 tablet (12.5 mg total) by mouth 2 (two) times daily. 180 tablet 3  . torsemide (DEMADEX) 20 MG tablet Take 1 tablet (20 mg total) by mouth 2 (two) times daily. 180 tablet 3   No current facility-administered medications for this visit.     Social History   Social History  . Marital status: Married    Spouse name: N/A  . Number of children: N/A  . Years of education: N/A   Occupational History  . Not on file.   Social History Main Topics  . Smoking status: Former Smoker    Quit date: 12/25/2008  . Smokeless tobacco: Never Used  . Alcohol use 0.0 oz/week     Comment: once a month  . Drug use: No  . Sexual activity: Not on file   Other Topics Concern  . Not on file   Social History Narrative  . No narrative on file   Socially, she is married and has 4 children, one grandchild.  There is no tobacco or alcohol use.  She does not routinely exercise.  Family History  Problem Relation Age of Onset  . Heart disease Mother   . Heart disease Father   . Lung disease Sister   . Heart disease Brother   . Hypertension Sister     ROS General: Negative; No fevers, chills, or night sweats HEENT: Negative; No changes in vision or hearing, sinus congestion, difficulty swallowing Pulmonary: Negative; No cough, wheezing, shortness  of breath, hemoptysis Cardiovascular: See HPI: No chest pain, presyncope, syncope, palpatations, intermitent leg swelling with venous insufficiency GI: Negative; No nausea, vomiting, diarrhea, or abdominal pain GU: Negative; No dysuria, hematuria, or difficulty voiding Musculoskeletal: Negative; no myalgias, joint pain, or weakness Hematologic: History of iron deficiency ; no easy bruising, bleeding Endocrine: Negative; no heat/cold intolerance; no diabetes, Rheumatologic: Positive for history of gout Neuro: Negative; no changes in balance, headaches Skin:  Malar rash Psychiatric: Negative; No behavioral problems, depression Sleep:  Positive for sleep apnea; Previous CPAP machine had malfunctioned and  she had not been on recent therapy. Other comprehensive 14 point system review is negative   Physical Exam BP 138/76   Pulse 77   Ht 5' 3.5" (1.613 m)   Wt 268 lb (121.6 kg)   BMI 46.73 kg/m    Wt Readings from Last 3 Encounters:  07/23/16 268 lb (121.6 kg)  04/12/16 260 lb (117.9 kg)  02/28/16 264 lb (119.7 kg)   General: Alert, oriented, no distress.  Morbid obesity Skin: normal turgor, no rashes, warm and dry HEENT: Normocephalic, atraumatic. Pupils equal round and reactive to light; sclera anicteric; extraocular muscles intact, No lid lag; Nose without nasal septal hypertrophy; Mouth/Parynx benign; Mallinpatti scale 3/4 Neck: Thick neck No JVD, no carotid bruits; normal carotid upstroke Lungs: clear to ausculatation and percussion bilaterally; no wheezing or rales, normal inspiratory and expiratory effort Chest wall: without tenderness to palpitation Heart: PMI not displaced, RRR, s1 s2 normal, 2/6systolic murmur, No diastolic murmur, no rubs, gallops, thrills, or heaves Abdomen: Moderately severe central adiposity soft, nontender; no hepatosplenomehaly, BS+; abdominal aorta nontender and not dilated by palpation. Back: no CVA tenderness Pulses: 2+  Musculoskeletal: full range  of motion, normal strength, no joint deformities Extremities: Pulses 2+, venous stasis changes; with 1+, tense lower extremity edema bilaterally; no clubbing cyanosis; Homan's sign negative  Neurologic: grossly nonfocal; Cranial nerves grossly wnl Psychologic: Normal mood and affect   ECG (independently read by me): Normal sinus rhythm at 77 bpm.  LVH by voltage criteria.  Nonspecific ST-T changes.  QTc interval 441 Caitlyn.  July 2017 ECG (independently read by me): Sinus rhythm at 67 bpm.  Nondiagnostic T-wave changes with T-wave inversion in lead 3 and aVF.  May 2016 ECG (independently read by me): Sinus rhythm with ventricular rate at 70 bpm.  T-wave inversion in leads 3 and aVF.  QTc interval 494 Caitlyn.  ECG (independently read by me): Normal sinus rhythm at 78 beats per minute.  LVH by voltage criteria in aVL.  No significant ST changes. QTc interval 437 Caitlyn.  LABS:  BMP Latest Ref Rng & Units 05/07/2014 03/26/2014 08/16/2011  Glucose 70 - 99 mg/dL 114(H) 103(H) 98  BUN 6 - 23 mg/dL _0 Creatinine 0.50 - 1.10 mg/dL 1.05 1.20(H) 1.29(H)  Sodium 135 - 145 mEq/L 144 142 143  Potassium 3.5 - 5.3 mEq/L 4.1 3.4(L) 3.7  Chloride 96 - 112 mEq/L 107 101 102  CO2 19 - 32 mEq/L _1 Calcium 8.4 - 10.5 mg/dL 8.9 9.0 9.8   Hepatic Function Latest Ref Rng & Units 03/26/2014 09/13/2009 10/08/2008  Total Protein 6.0 - 8.3 g/dL 7.2 6.2 7.9  Albumin 3.5 - 5.2 g/dL 4.0 3.3(L) 4.3  AST 0 - 37 U/L _2 ALT 0 - 35 U/L 32 17 20  Alk Phosphatase 39 - 117 U/L 96 46 90  Total Bilirubin 0.2 - 1.2 mg/dL 0.6 0.7 0.4   CBC Latest Ref Rng & Units 05/25/2015 03/26/2014 05/28/2013  WBC 4.0 - 10.5 K/uL 3.7(L) 4.1 4.2  Hemoglobin 12.0 - 15.0 g/dL 10.8(L) 11.8(L) 12.3  Hematocrit 36.0 - 46.0 % 33.5(L) 35.8(L) 36.7  Platelets 150 - 400 K/uL 360 385 425(H)    Lab Results  Component Value Date   MCV 90.8 05/25/2015   MCV 89.5 03/26/2014   MCV 89.7 05/28/2013    Lab Results  Component Value Date   TSH  2.808 03/26/2014   Lab Results  Component Value Date   HGBA1C 5.3 08/01/2015  Lipid Panel     Component Value Date/Time   CHOL 145 12/23/2015 0946   TRIG 129.0 12/23/2015 0946   HDL 41.40 12/23/2015 0946   CHOLHDL 3 12/23/2015 0946   VLDL 25.8 12/23/2015 0946   LDLCALC 77 12/23/2015 0946      RADIOLOGY: No results found.  IMPRESSION:  1. Paroxysmal atrial fibrillation (H[facility])   2. Morbid obesity due to excess calories ([place])   3. Nonischemic cardiomyopathy ([place])   4. Bilateral lower extremity edema   5. Chronic systolic CHF (congestive heart failure), NYHA class 2 ([place])   6. Alteration in anticoagulation   7. Pure hypercholesterolemia   8. OSA (obstructive sleep apnea)   9. Hypothyroidism, unspecified type     ASSESSMENT AND PLAN: Caitlyn. Cuffie is a 61 year old morbidly obese African-American female has a history of previous nonischemic cardiomyopathy with an ejection fraction of approximately 35% initially.  Subsequent echo Doppler study in 2013 showed her ejection fraction at 40-45%.  She has been maintaining normal sinus rhythm and has not had recurrent atrial fibrillation.  She has venous insufficiency and has bilateral lower extremity edema.  Although prescribed, she has not been using support stockings.  When I last saw her, she has a history of iron deficiency and was to undergo colonoscopy for which her Coumadin was held.  I do not have the specifics of the results.  She recently was referred for a subsequent sleep study since she had not been using CPAP.  She had malfunction.  This reconfirmed severe sleep apnea.  She was titrated up to 97 m water pressure on a separate CPAP titration.  She is never seen Dr. Annamaria Fuentes in follow-up and she prefers that I continue to follow her sleep apnea.  We will contact her DME company to make certain she gets her new equipment.  Since she has not yet received this.  She has significant lower exterminate edema, but apparently ran out of her  torsemide, which she had been taking 40 mg twice a day.  She also has been very unreliable with her warfarin and ran out of this several weeks ago as well.  Her ECG in the office today confirms sinus rhythm.  She was unable to refill her Coreg CR and was given a prescription for carvedilol 12.5 mg twice a day D, but she is not taking this.  I have suggested that she can initiate carvedilol 12.5 twice a day.  I have recommended resumption of torsemide, but will start this at 40 mg daily rather than twice a day.  Because he is unreliable with her warfarin, we have elected to start eliquis 5 mg twice a day and have provided her with samples.  Her BMI today is 46.73 and is consistent with morbid obesity.  Weight loss and exercise was strongly encouraged.  He has a history of hyperlipidemia and is on Crestor 40 mg daily with target LDL less than 70.  He continues to be on levothyroxine 25 g for hypothyroidism.  She has not had any recent gout episodes.  A complete set of fasting laboratory will be obtained.  She will be contacted by her DME company for set up with her new CPAP machine.  I will then need to see her in follow-up of her new set up within 90 days per Medicare guidelines for compliance evaluation and adjustments to her therapy will be made at that time.  Time spent: 40 minutes  Troy Sine, MD, [facility]  07/27/2016 12:25 PM

## 2016-07-23 NOTE — Telephone Encounter (Signed)
Did leave a message per DPR letting her know that she should follow up on this and to call us back.

## 2016-07-23 NOTE — Telephone Encounter (Signed)
-----   Message from Annamaria Helling, [place] sent at 07/23/2016 12:42 PM EDT ----- Thanks so much  I will make Dr. Carles Collet aware.   Jade ----- Message ----- From: Theodore Demark, RN Sent: 07/23/2016  12:00 PM To: Annamaria Helling, CMA  No worries. We'll follow up with [facility]. Dr. Claiborne Billings wants to assume care of patient's sleep d/o  Thank you!  -Ovid Curd  ----- Message ----- From: Annamaria Helling, CMA Sent: 07/23/2016  11:23 AM To: Theodore Demark, RN  Kennyth Lose, we sent this order to [facility]. I guess they never contacted her?   Jade ----- Message ----- From: Theodore Demark, RN Sent: 07/23/2016   9:39 AM To: Annamaria Helling, CMA  Hello, Dr. Claiborne Billings saw this patient in clinic today. She had sleep study in December & assigned to Dr. Annamaria Boots. Appears she was set up for CPAP back in January but she was never contacted. Pt came in today and will want Dr. Claiborne Billings to manage.  Can you verify orders were sent? Thanks.  Ovid Curd

## 2016-08-10 ENCOUNTER — Emergency Department (HOSPITAL_BASED_OUTPATIENT_CLINIC_OR_DEPARTMENT_OTHER)
Admission: EM | Admit: 2016-08-10 | Discharge: 2016-08-11 | Disposition: A | Discharge status: Home / Self Care | Payer: 59 | Attending: Emergency Medicine | Admitting: Emergency Medicine

## 2016-08-10 ENCOUNTER — Encounter (HOSPITAL_BASED_OUTPATIENT_CLINIC_OR_DEPARTMENT_OTHER): Payer: Self-pay | Admitting: *Deleted

## 2016-08-10 ENCOUNTER — Emergency Department (HOSPITAL_BASED_OUTPATIENT_CLINIC_OR_DEPARTMENT_OTHER): Payer: 59

## 2016-08-10 DIAGNOSIS — Z87891 Personal history of nicotine dependence: Secondary | ICD-10-CM | POA: Diagnosis not present

## 2016-08-10 DIAGNOSIS — R0602 Shortness of breath: Secondary | ICD-10-CM | POA: Diagnosis not present

## 2016-08-10 DIAGNOSIS — I10 Essential (primary) hypertension: Secondary | ICD-10-CM | POA: Insufficient documentation

## 2016-08-10 LAB — TROPONIN I: Troponin I: <0.03 ng/mL (ref <0.03)

## 2016-08-10 LAB — COMPREHENSIVE METABOLIC PANEL
ALBUMIN: 3.9 g/dL (ref 3.5–5.0)
ALT: 18 U/L (ref 14–54)
AST: 21 U/L (ref 15–41)
Alkaline Phosphatase: 93 U/L (ref 38–126)
Anion gap: 11 (ref 5–15)
BUN: 13 mg/dL (ref 6–20)
CHLORIDE: 107 mmol/L (ref 101–111)
CO2: 22 mmol/L (ref 22–32)
Calcium: 8.9 mg/dL (ref 8.9–10.3)
Creatinine, Ser: 1.17 mg/dL — ABNORMAL HIGH (ref 0.44–1.00)
GFR calc non Af Amer: 50 mL/min — ABNORMAL LOW (ref >60)
GFR, EST AFRICAN AMERICAN: 57 mL/min — AB (ref >60)
Glucose, Bld: 98 mg/dL (ref 65–99)
Potassium: 3.6 mmol/L (ref 3.5–5.1)
SODIUM: 140 mmol/L (ref 135–145)
Total Bilirubin: 0.7 mg/dL (ref 0.3–1.2)
Total Protein: 7.3 g/dL (ref 6.5–8.1)

## 2016-08-10 LAB — CBC WITH DIFFERENTIAL/PLATELET
BASOS ABS: 0 10*3/uL (ref 0.0–0.1)
BASOS PCT: 1 %
Eosinophils Absolute: 0.1 10*3/uL (ref 0.0–0.7)
Eosinophils Relative: 2 %
HEMATOCRIT: 37.1 % (ref 36.0–46.0)
HEMOGLOBIN: 12.5 g/dL (ref 12.0–15.0)
LYMPHS PCT: 34 %
Lymphs Abs: 1.5 10*3/uL (ref 0.7–4.0)
MCH: 30.8 pg (ref 26.0–34.0)
MCHC: 33.7 g/dL (ref 30.0–36.0)
MCV: 91.4 fL (ref 78.0–100.0)
Monocytes Absolute: 0.4 10*3/uL (ref 0.1–1.0)
Monocytes Relative: 9 %
NEUTROS ABS: 2.3 10*3/uL (ref 1.7–7.7)
NEUTROS PCT: 54 %
PLATELETS: 332 10*3/uL (ref 150–400)
RBC: 4.06 MIL/uL (ref 3.87–5.11)
RDW: 13.6 % (ref 11.5–15.5)
WBC: 4.3 10*3/uL (ref 4.0–10.5)

## 2016-08-10 LAB — BRAIN NATRIURETIC PEPTIDE: B NATRIURETIC PEPTIDE 5: 521.1 pg/mL — AB (ref 0.0–100.0)

## 2016-08-10 LAB — PROTIME-INR
INR: 1.35
Prothrombin Time: 16.8 seconds — ABNORMAL HIGH (ref 11.4–15.2)

## 2016-08-10 MED ORDER — FUROSEMIDE 10 MG/ML IJ SOLN
80.0000 mg | Freq: Once | INTRAMUSCULAR | Status: AC
  Administered 2016-08-10: 80 mg via INTRAVENOUS
  Filled 2016-08-10: qty 8

## 2016-08-10 NOTE — ED Triage Notes (Signed)
Sob worse x 2 days. Stage 3 renal failure per husband. She is ambulatory. Wheezing and coughing.

## 2016-08-10 NOTE — ED Provider Notes (Signed)
[facility] DEPT [facility] Provider Note: Caitlyn Spurling, MD, FACEP  CSN: [id number] MRN: [id number] ARRIVAL: 08/10/16 at 2201 ROOM: [place]  By signing my name below, I, Sonum Patel, attest that this documentation has been prepared under the direction and in the presence of Shanon Rosser, MD. Electronically Signed: Sonum Patel, Education administrator. 08/10/16. 11:18 PM.  CHIEF COMPLAINT  Shortness of Breath   HISTORY OF PRESENT ILLNESS  Caitlyn Fuentes is a 61 y.o. female complaining of a persistent, non-produtive cough and SOB that began 1 week ago. She reports associated wheezing and notes having worsened SOB and difficulty completing sentences that began earlier today. Shortness of breath is worse with ambulation. Symptoms are moderate to severe at their worst. She states the SOB has gradually improved since she has been here in the ED. She believes the oxygen and rest has helped alleviated her symptoms. She notes chest tightness and diarrhea. She denies chest pain, nausea, vomiting, fever. She denies history of asthma. She is followed by [facility]. She reports currently taking Coumadin daily; has not yet switched to Eliquis. She takes torsemide 40 mg BID; states she took her morning dose a little late and has not yet taken the evening dose. She reports voiding less than usual today. She reports having lower extremity swelling at baseline.   Past Medical History:  Diagnosis Date  . A-fib ([place])   . Cardiomyopathy   . Hyperlipidemia   . Hypertension   . Lupus   . Pulmonary hypertension ([place]) 05/10/2011   Echo, EF-40-45  . Renal disorder   . Sleep apnea 2008   CPAP, pt does not know settings  . Thyroid disease     Past Surgical History:  Procedure Laterality Date  .  c sections  x 2  . ABDOMINAL HYSTERECTOMY     complete  . CARDIAC CATHETERIZATION     Nonischemic, EF-40, continued medical therapy  . CARDIAC CATHETERIZATION  10/02/2006   No significant CAD  . COLONOSCOPY WITH  PROPOFOL N/A 11/22/2015   Procedure: COLONOSCOPY WITH PROPOFOL;  Surgeon: Juanita Craver, MD;  Location: [facility];  Service: Endoscopy;  Laterality: N/A;  . ESOPHAGOGASTRODUODENOSCOPY (EGD) WITH PROPOFOL N/A 11/22/2015   Procedure: ESOPHAGOGASTRODUODENOSCOPY (EGD) WITH PROPOFOL;  Surgeon: Juanita Craver, MD;  Location: [facility];  Service: Endoscopy;  Laterality: N/A;    Family History  Problem Relation Age of Onset  . Heart disease Mother   . Heart disease Father   . Lung disease Sister   . Heart disease Brother   . Hypertension Sister     Social History  Substance Use Topics  . Smoking status: Former Smoker    Quit date: 12/25/2008  . Smokeless tobacco: Never Used  . Alcohol use 0.0 oz/week     Comment: once a month    Prior to Admission medications   Medication Sig Start Date End Date Taking? Authorizing Provider  allopurinol (ZYLOPRIM) 100 MG tablet Take 200 mg by mouth daily.  07/09/13   Historical Provider, MD  apixaban (ELIQUIS) 5 MG TABS tablet Take 1 tablet (5 mg total) by mouth 2 (two) times daily. 07/23/16   Troy Sine, MD  atorvastatin (LIPITOR) 40 MG tablet TAKE 1 TABLET (40 MG TOTAL) BY MOUTH DAILY. 12/21/15   Troy Sine, MD  carvedilol (COREG) 12.5 MG tablet Take 1 tablet (12.5 mg total) by mouth 2 (two) times daily. 07/23/16 10/21/16  Troy Sine, MD  celecoxib (CELEBREX) 200 MG capsule Take 200 mg by mouth as needed  for mild pain.  03/16/15   Historical Provider, MD  colchicine 0.6 MG tablet Take 0.6 mg by mouth daily.    Historical Provider, MD  estradiol (ESTRACE) 2 MG tablet Take 2 mg by mouth daily.  03/22/14   Historical Provider, MD  hydrALAZINE (APRESOLINE) 25 MG tablet TAKE 1 TABLET (25 MG TOTAL) BY MOUTH 2 (TWO) TIMES DAILY. 07/02/16   Troy Sine, MD  levothyroxine (SYNTHROID, LEVOTHROID) 25 MCG tablet Take 25 mcg by mouth daily. 07/14/14   Historical Provider, MD  Multiple Vitamin (MULITIVITAMIN WITH MINERALS) TABS Take 1 tablet by mouth daily.     Historical Provider, MD  omega-3 acid ethyl esters (LOVAZA) 1 g capsule Take 1 g by mouth daily.    Historical Provider, MD  oxyCODONE (ROXICODONE) 5 MG immediate release tablet Take 1 tablet (5 mg total) by mouth every 4 (four) hours as needed for severe pain. 02/28/16   Deno Etienne, DO  torsemide (DEMADEX) 20 MG tablet Take 1 tablet (20 mg total) by mouth 2 (two) times daily. 07/23/16 10/21/16  Troy Sine, MD  zolpidem (AMBIEN) 10 MG tablet Take 10 mg by mouth at bedtime as needed for sleep.     Historical Provider, MD    Allergies Diovan [valsartan] and Lisinopril   REVIEW OF SYSTEMS  Negative except as noted here or in the History of Present Illness.   PHYSICAL EXAMINATION  Initial Vital Signs Blood pressure (!) 148/85, pulse (!) 105, temperature 98.3 F (36.8 C), temperature source Oral, resp. rate (!) 22, height 5' 3.5" (1.613 m), weight 268 lb (121.6 kg), SpO2 95 %.  Examination General: Well-developed, well-nourished female in no acute distress; appearance consistent with age of record HENT: normocephalic; atraumatic Eyes: pupils equal, round and reactive to light; extraocular muscles intact Neck: supple Heart: regular rhythm; tachycardia and occasional PACs  Lungs: Rales in the bases with coarse sounds in the upper lobes and wheezy cough Abdomen: soft; nondistended; nontender; no masses or hepatosplenomegaly; bowel sounds present Extremities: No deformity; full range of motion; pulses normal; edema of lower legs bilaterally  Neurologic: Awake, alert and oriented; motor function intact in all extremities and symmetric; no facial droop Skin: Warm and dry Psychiatric: Normal mood and affect   RESULTS  Summary of this visit's results, reviewed by myself:   EKG Interpretation  Date/Time:  Friday August 10 2016 22:16:05 EDT Ventricular Rate:  111 PR Interval:    QRS Duration: 84 QT Interval:  338 QTC Calculation: 460 R Axis:   32 Text Interpretation:  Sinus tachycardia  Atrial premature complexes Borderline T abnormalities, lateral leads Baseline wander in lead(s) II III aVF Rate is faster Confirmed by Djuna Frechette  MD, Jenny Reichmann ([id number]) on 08/10/2016 10:40:08 PM      Laboratory Studies: Results for orders placed or performed during the hospital encounter of 08/10/16 (from the past 24 hour(s))  CBC with Differential     Status: None   Collection Time: 08/10/16 10:19 PM  Result Value Ref Range   WBC 4.3 4.0 - 10.5 K/uL   RBC 4.06 3.87 - 5.11 MIL/uL   Hemoglobin 12.5 12.0 - 15.0 g/dL   HCT 37.1 36.0 - 46.0 %   MCV 91.4 78.0 - 100.0 fL   MCH 30.8 26.0 - 34.0 pg   MCHC 33.7 30.0 - 36.0 g/dL   RDW 13.6 11.5 - 15.5 %   Platelets 332 150 - 400 K/uL   Neutrophils Relative % 54 %   Neutro Abs 2.3 1.7 - 7.7  K/uL   Lymphocytes Relative 34 %   Lymphs Abs 1.5 0.7 - 4.0 K/uL   Monocytes Relative 9 %   Monocytes Absolute 0.4 0.1 - 1.0 K/uL   Eosinophils Relative 2 %   Eosinophils Absolute 0.1 0.0 - 0.7 K/uL   Basophils Relative 1 %   Basophils Absolute 0.0 0.0 - 0.1 K/uL  Comprehensive metabolic panel     Status: Abnormal   Collection Time: 08/10/16 10:19 PM  Result Value Ref Range   Sodium 140 135 - 145 mmol/L   Potassium 3.6 3.5 - 5.1 mmol/L   Chloride 107 101 - 111 mmol/L   CO2 22 22 - 32 mmol/L   Glucose, Bld 98 65 - 99 mg/dL   BUN 13 6 - 20 mg/dL   Creatinine, Ser 1.17 (H) 0.44 - 1.00 mg/dL   Calcium 8.9 8.9 - 10.3 mg/dL   Total Protein 7.3 6.5 - 8.1 g/dL   Albumin 3.9 3.5 - 5.0 g/dL   AST 21 15 - 41 U/L   ALT 18 14 - 54 U/L   Alkaline Phosphatase 93 38 - 126 U/L   Total Bilirubin 0.7 0.3 - 1.2 mg/dL   GFR calc non Af Amer 50 (L) >60 mL/min   GFR calc Af Amer 57 (L) >60 mL/min   Anion gap 11 5 - 15  Troponin I     Status: None   Collection Time: 08/10/16 10:19 PM  Result Value Ref Range   Troponin I <0.03 <0.03 ng/mL  Brain natriuretic peptide     Status: Abnormal   Collection Time: 08/10/16 10:19 PM  Result Value Ref Range   B Natriuretic Peptide  521.1 (H) 0.0 - 100.0 pg/mL  Protime-INR     Status: Abnormal   Collection Time: 08/10/16 10:19 PM  Result Value Ref Range   Prothrombin Time 16.8 (H) 11.4 - 15.2 seconds   INR 1.35    Imaging Studies: Dg Chest 2 View  Result Date: 08/10/2016 CLINICAL DATA:  Shortness of breath over the last 2 days. EXAM: CHEST  2 VIEW COMPARISON:  08/16/2011 FINDINGS: There is cardiomegaly. There is venous hypertension with early interstitial edema. No consolidation or collapse. No visible effusion. No significant bone finding. IMPRESSION: Cardiomegaly, pulmonary venous hypertension and early interstitial edema. Electronically Signed   By: Nelson Chimes M.D.   On: 08/10/2016 22:43    ED COURSE  Nursing notes and initial vitals signs, including pulse oximetry, reviewed.  Vitals:   08/10/16 2245 08/10/16 2300 08/10/16 2330 08/11/16 0000  BP: (!) 148/85 139/81 (!) 146/91 (!) 135/94  Pulse: (!) 105 90 89 93  Resp: (!) 22 (!) 32 (!) 24 (!) 28  Temp:      TempSrc:      SpO2: 95% 97% 97% 97%  Weight:      Height:       11:22 PM Lasix 80mg  IV ordered.   1:14 AM Brisk diuresis after IV Lasix. Patient feeling better. Lungs are now clear. She's been able to ambulate around the department without dropping her oxygen saturation below 90%. She states she wishes to go home and does not wish to be admitted to the hospital. She was advised to return if breathing worsens, to continue taking her torsemide as prescribed and to contact her cardiologist as soon as possible.  PROCEDURES    ED DIAGNOSES     ICD-9-CM ICD-10-CM   1. Shortness of breath 786.05 R06.02     I personally performed the services described in  this documentation, which was scribed in my presence. The recorded information has been reviewed and is accurate.    Shanon Rosser, MD 08/11/16 [phone]

## 2016-08-11 NOTE — ED Notes (Signed)
Pt's O2 ranged from 90-93 while ambulating. Pt's HR ranged from 120-138 while ambulating. RN and MD notified.

## 2016-08-11 NOTE — ED Notes (Signed)
Pt verbalizes understanding of d/c instructions and denies any further needs at this time. 

## 2016-08-11 NOTE — ED Notes (Signed)
Pt has urinated quite a lot, states she feels much better and not as short of breath.

## 2016-08-13 ENCOUNTER — Ambulatory Visit (INDEPENDENT_AMBULATORY_CARE_PROVIDER_SITE_OTHER): Payer: 59 | Admitting: Physician Assistant

## 2016-08-13 ENCOUNTER — Encounter: Payer: Self-pay | Admitting: Physician Assistant

## 2016-08-13 ENCOUNTER — Telehealth: Payer: Self-pay | Admitting: Cardiovascular Disease

## 2016-08-13 VITALS — BP 140/88 | HR 86 | Ht 63.5 in | Wt 264.6 lb

## 2016-08-13 DIAGNOSIS — N183 Chronic kidney disease, stage 3 unspecified: Secondary | ICD-10-CM

## 2016-08-13 DIAGNOSIS — I5043 Acute on chronic combined systolic (congestive) and diastolic (congestive) heart failure: Secondary | ICD-10-CM

## 2016-08-13 DIAGNOSIS — I10 Essential (primary) hypertension: Secondary | ICD-10-CM

## 2016-08-13 DIAGNOSIS — I480 Paroxysmal atrial fibrillation: Secondary | ICD-10-CM | POA: Diagnosis not present

## 2016-08-13 MED ORDER — POTASSIUM CHLORIDE ER 20 MEQ PO TBCR: 40.0000 meq | EXTENDED_RELEASE_TABLET | ORAL | 0 refills | Status: DC

## 2016-08-13 MED ORDER — METOLAZONE 2.5 MG PO TABS: 2.5000 mg | ORAL_TABLET | ORAL | 0 refills | Status: DC

## 2016-08-13 NOTE — Telephone Encounter (Signed)
New message      Pt was seen at [facility] on Friday.  She states that she was told that she has fluid on her lungs and need to be seen today.  Please call

## 2016-08-13 NOTE — Patient Instructions (Addendum)
Medication Instructions:  1. START METOLAZONE 2.5 MG TABLET TODAY 30 MINUTES BEFORE YOUR NEXT TORSEMIDE DOSE; YOU MAY REPEAT METOLAZONE 2.5 MG ON WED 30 MINUTES BEFORE YOUR TORSEMIDE IF YOU ARE STILL NOT FEELING BETTER  2. START POTASSIUM 40 MEQ; YOU WILL ONLY TAKE THE POTASSIUM ON THE DAYS YOU TAKE METOLAZONE   Labwork: BMET TO BE DONE Friday 08/17/16 WHEN YOU SEE [place] WEAVER, PAC  Testing/Procedures: Burman Blacksmith  Follow-Up: Richardson Dopp, [facility] 08/17/16 @ 11:15  Any Other Special Instructions Will Be Listed Below (If Applicable).  If you need a refill on your cardiac medications before your next appointment, please call your pharmacy.

## 2016-08-13 NOTE — Progress Notes (Signed)
Cardiology Office Note:    Date:  08/13/2016   ID:  Caitlyn Fuentes, DOB January 12, 1956, MRN [id number]  PCP:  Caitlyn Gravel, MD  Cardiologist:  Dr. Shelva Fuentes   Electrophysiologist:  n/a Nephrologist: Dr. Justin Fuentes  Referring MD: Caitlyn Gravel, MD   Chief Complaint  Patient presents with  . Hospitalization Follow-up    Follow-up from ED-seen for CHF    History of Present Illness:    Caitlyn Fuentes is a 61 y.o. female with a hx of atrial fibrillation, systolic CHF secondary to nonischemic cardiomyopathy, normal coronary arteries by cardiac catheterization in 2011, sleep apnea on CPAP in the past, obesity, HTN, CKD, lupus, venous insufficiency. Last seen by Dr. Claiborne Fuentes 07/23/16. The patient has had difficulty with nonadherence with warfarin. When last seen, she had run out of her diuretic. Torsemide was resumed at 40 mg daily rather than twice a day. Warfarin was stopped and she was placed on Eliquis 5 mg twice a day.  She went to the emergency room 08/10/16 with shortness of breath. Troponin was negative. BNP was elevated and chest x-ray suggested early interstitial edema. She was given a dose of IV Lasix.  She is here today with her husband. She did feel better initially with IV Lasix. However, over the past 2 days, she has continued to feel like she is overloaded with fluid. She notes orthopnea as well as PND. She has a dry, nonproductive cough. She has chest discomfort that is similar to what she has experienced in the past with volume excess. She denies syncope. She denies fevers or chills. She tries to limit her salt.  Prior CV studies:   The following studies were reviewed today:  Echo 01/06/16 EF 45-50, diffuse HK, grade 1 diastolic dysfunction, mild MR  Carotid US 9/17 Bilateral ICA 1-39  Holter monitor 2/17 Frequent PACs and less frequent PVCs One 4 beat run of wide complex v-tach She may have had 3-4 second run of afib but more likely atrial tach(page 30).  Myoview 5/13 EF 56, no  ischemia, low risk  Echo 1/13 EF 40-45, moderate LAE, RVSP 40  LHC 5/11 LM normal LAD normal LCx normal RCA normal EF 40  Past Medical History:  Diagnosis Date  . A-fib ([place])   . Cardiomyopathy   . Hyperlipidemia   . Hypertension   . Lupus   . Pulmonary hypertension ([place]) 05/10/2011   Echo, EF-40-45  . Renal disorder   . Sleep apnea 2008   CPAP, pt does not know settings  . Thyroid disease     Past Surgical History:  Procedure Laterality Date  .  c sections  x 2  . ABDOMINAL HYSTERECTOMY     complete  . CARDIAC CATHETERIZATION     Nonischemic, EF-40, continued medical therapy  . CARDIAC CATHETERIZATION  10/02/2006   No significant CAD  . COLONOSCOPY WITH PROPOFOL N/A 11/22/2015   Procedure: COLONOSCOPY WITH PROPOFOL;  Surgeon: Juanita Craver, MD;  Location: [facility];  Service: Endoscopy;  Laterality: N/A;  . ESOPHAGOGASTRODUODENOSCOPY (EGD) WITH PROPOFOL N/A 11/22/2015   Procedure: ESOPHAGOGASTRODUODENOSCOPY (EGD) WITH PROPOFOL;  Surgeon: Juanita Craver, MD;  Location: [facility];  Service: Endoscopy;  Laterality: N/A;    Current Medications: Current Meds  Medication Sig  . allopurinol (ZYLOPRIM) 100 MG tablet Take 200 mg by mouth daily.   Marland Kitchen apixaban (ELIQUIS) 5 MG TABS tablet Take 1 tablet (5 mg total) by mouth 2 (two) times daily.  Marland Kitchen atorvastatin (LIPITOR) 40 MG tablet TAKE 1 TABLET (40 MG  TOTAL) BY MOUTH DAILY.  . carvedilol (COREG) 12.5 MG tablet Take 1 tablet (12.5 mg total) by mouth 2 (two) times daily.  . celecoxib (CELEBREX) 200 MG capsule Take 200 mg by mouth as needed for mild pain.   Marland Kitchen colchicine 0.6 MG tablet Take 0.6 mg by mouth daily.  Marland Kitchen estradiol (ESTRACE) 2 MG tablet Take 2 mg by mouth daily.   . hydrALAZINE (APRESOLINE) 25 MG tablet TAKE 1 TABLET (25 MG TOTAL) BY MOUTH 2 (TWO) TIMES DAILY.  Marland Kitchen levothyroxine (SYNTHROID, LEVOTHROID) 25 MCG tablet Take 25 mcg by mouth daily.  . Multiple Vitamin (MULITIVITAMIN WITH MINERALS) TABS Take 1 tablet by mouth  daily.  Marland Kitchen omega-3 acid ethyl esters (LOVAZA) 1 g capsule Take 1 g by mouth daily.  Marland Kitchen oxyCODONE (ROXICODONE) 5 MG immediate release tablet Take 1 tablet (5 mg total) by mouth every 4 (four) hours as needed for severe pain.  Marland Kitchen torsemide (DEMADEX) 20 MG tablet TAKE 2 (TWO) TABLETS BY MOUTH TWICE DAILY  . zolpidem (AMBIEN) 10 MG tablet Take 10 mg by mouth at bedtime as needed for sleep.      Allergies:   Diovan [valsartan] and Lisinopril   Social History   Social History  . Marital status: Married    Spouse name: N/A  . Number of children: N/A  . Years of education: N/A   Social History Main Topics  . Smoking status: Former Smoker    Quit date: 12/25/2008  . Smokeless tobacco: Never Used  . Alcohol use 0.0 oz/week     Comment: once a month  . Drug use: No  . Sexual activity: Not Asked   Other Topics Concern  . None   Social History Narrative  . None     Family History  Problem Relation Age of Onset  . Heart disease Mother   . Heart disease Father   . Lung disease Sister   . Heart disease Brother   . Hypertension Sister      ROS:   Please see the history of present illness.    Review of Systems  Constitution: Positive for malaise/fatigue.  Cardiovascular: Positive for chest pain, dyspnea on exertion, irregular heartbeat and leg swelling.  Respiratory: Positive for cough, shortness of breath and wheezing.   Musculoskeletal: Positive for joint pain.   All other systems reviewed and are negative.   EKGs/Labs/Other Test Reviewed:    EKG:  EKG is  ordered today.  The ekg ordered today demonstrates NSR, HR 86, normal axis, nonspecific ST-T wave changes, septal Q waves, QTc 459 ms, no significant change when compared to prior tracing  Recent Labs: 08/10/2016: ALT 18; B Natriuretic Peptide 521.1; BUN 13; Creatinine, Ser 1.17; Hemoglobin 12.5; Platelets 332; Potassium 3.6; Sodium 140   Recent Lipid Panel    Component Value Date/Time   CHOL 145 12/23/2015 0946   TRIG  129.0 12/23/2015 0946   HDL 41.40 12/23/2015 0946   CHOLHDL 3 12/23/2015 0946   VLDL 25.8 12/23/2015 0946   LDLCALC 77 12/23/2015 0946    Physical Exam:    VS:  BP 140/88   Pulse 86   Ht 5' 3.5" (1.613 m)   Wt 264 lb 9.6 oz (120 kg)   BMI 46.14 kg/m     Wt Readings from Last 3 Encounters:  08/13/16 264 lb 9.6 oz (120 kg)  08/10/16 268 lb (121.6 kg)  07/23/16 268 lb (121.6 kg)     Physical Exam  Constitutional: She is oriented to person, place,  and time. She appears well-developed and well-nourished. No distress.  HENT:  Head: Normocephalic and atraumatic.  Eyes: No scleral icterus.  Neck: JVD (difficult to assess; JVP approximately 7-8 cm) present.  Cardiovascular: Normal rate, regular rhythm and normal heart sounds.   No murmur heard. Pulmonary/Chest: She has decreased breath sounds. She has no wheezes. She has no rales.  Abdominal: There is no tenderness.  Musculoskeletal: She exhibits edema (tight 1-2+ bilateral LE).  Neurological: She is alert and oriented to person, place, and time.  Skin: Skin is warm and dry.  Psychiatric: She has a normal mood and affect.    ASSESSMENT:    1. Acute on chronic combined systolic and diastolic CHF (congestive heart failure) (HCC)   2. Paroxysmal atrial fibrillation ([place])   3. Essential hypertension   4. CKD (chronic kidney disease) stage 3, GFR 30-59 ml/min    PLAN:    In order of problems listed above:  1. Acute on chronic combined systolic and diastolic CHF (congestive heart failure) ([place]) - She remains volume overloaded.  She still has about 6 lbs to lose ("dry weight" at home ~ 258 // she was 263 today).  She is on a large dose of Torsemide already.    -  Continue Torsemide 40 bid  -  Add Metolazone 2.5 x 1; can repeat x 1 in 2 days if needed  -  Take K+ 40 mEq x 1 on Metolazone days  -  If no response to Metolazone, will need to be admitted for IV diuresis  -  Close FU later this week with BMET  -  If chest pain  persists despite volume improvement, consider nuclear stress test  2. Paroxysmal atrial fibrillation (HCC) - Maintaining NSR. Continue Eliquis.    3. Essential hypertension - BP elevated today.  Adjust diuresis as noted.  Continue to monitor.   4. CKD (chronic kidney disease) stage 3, GFR 30-59 ml/min - Recent Creatinine ok.  Will get repeat BMET later this week at follow up.    Dispo:  Return in about 4 days (around 08/17/2016) for Close Follow Up, w/ Richardson Dopp, PA-C.   Medication Adjustments/Labs and Tests Ordered: Current medicines are reviewed at length with the patient today.  Concerns regarding medicines are outlined above.  Orders placed this visit:  Orders Placed This Encounter  Procedures  . Basic Metabolic Panel (BMET)  . EKG 12-Lead   Medication changes this visit: Meds ordered this encounter  Medications  . metolazone (ZAROXOLYN) 2.5 MG tablet    Sig: Take 1 tablet (2.5 mg total) by mouth as directed.    Dispense:  5 tablet    Refill:  0  . Potassium Chloride ER 20 MEQ TBCR    Sig: Take 40 mEq by mouth as directed. Take only when you take Metolazone    Dispense:  5 tablet    Refill:  0    Signed, Richardson Dopp, PA-C  08/13/2016 4:45 PM    [facility] [address], [place], [place]  [address] Phone: [phone]; Fax: [phone]

## 2016-08-13 NOTE — Telephone Encounter (Signed)
Returned call to patient-patient reports she was in the ED on 4/20 for SOB and retaining fluid.  Reports she was given IV lasix and lost 9 lbs.  Was going to be admitted but did not want to therefore was instructed to call cardiology and see someone ASAP.    Patient reports continued SOB, increased with exertion, SOB at night, has to sit up to "catch her breath", reports decrease in LE edema but not resolved (chronic LE edema noted on last OV note), weight this AM 263 lbs (268lbs at last OV 4/20).  Currently taking torsemide 20mg  BID.      Appointment made for today with Richardson Dopp PA at [facility].  Patient aware of location and advised to arrive 15 mins prior and bring medications.  Patient verbalized understanding.

## 2016-08-17 ENCOUNTER — Inpatient Hospital Stay (HOSPITAL_COMMUNITY)
Admission: EM | Admit: 2016-08-17 | Discharge: 2016-08-20 | DRG: 292 | Disposition: A | Discharge status: Home Health | Payer: 59 | Attending: Internal Medicine | Admitting: Internal Medicine

## 2016-08-17 ENCOUNTER — Encounter (HOSPITAL_COMMUNITY): Payer: Self-pay

## 2016-08-17 ENCOUNTER — Emergency Department (HOSPITAL_COMMUNITY): Payer: 59

## 2016-08-17 ENCOUNTER — Ambulatory Visit: Payer: 59 | Admitting: Physician Assistant

## 2016-08-17 ENCOUNTER — Telehealth: Payer: Self-pay | Admitting: Cardiovascular Disease

## 2016-08-17 ENCOUNTER — Observation Stay (HOSPITAL_COMMUNITY): Payer: 59

## 2016-08-17 DIAGNOSIS — R200 Anesthesia of skin: Secondary | ICD-10-CM | POA: Diagnosis present

## 2016-08-17 DIAGNOSIS — R209 Unspecified disturbances of skin sensation: Secondary | ICD-10-CM

## 2016-08-17 DIAGNOSIS — E876 Hypokalemia: Secondary | ICD-10-CM | POA: Diagnosis present

## 2016-08-17 DIAGNOSIS — G459 Transient cerebral ischemic attack, unspecified: Secondary | ICD-10-CM

## 2016-08-17 DIAGNOSIS — R29810 Facial weakness: Secondary | ICD-10-CM | POA: Diagnosis present

## 2016-08-17 DIAGNOSIS — G4733 Obstructive sleep apnea (adult) (pediatric): Secondary | ICD-10-CM | POA: Diagnosis present

## 2016-08-17 DIAGNOSIS — N182 Chronic kidney disease, stage 2 (mild): Secondary | ICD-10-CM | POA: Diagnosis present

## 2016-08-17 DIAGNOSIS — Z79890 Hormone replacement therapy (postmenopausal): Secondary | ICD-10-CM

## 2016-08-17 DIAGNOSIS — R55 Syncope and collapse: Secondary | ICD-10-CM

## 2016-08-17 DIAGNOSIS — E860 Dehydration: Secondary | ICD-10-CM | POA: Diagnosis present

## 2016-08-17 DIAGNOSIS — R202 Paresthesia of skin: Secondary | ICD-10-CM

## 2016-08-17 DIAGNOSIS — N179 Acute kidney failure, unspecified: Secondary | ICD-10-CM | POA: Diagnosis present

## 2016-08-17 DIAGNOSIS — I5042 Chronic combined systolic (congestive) and diastolic (congestive) heart failure: Secondary | ICD-10-CM | POA: Diagnosis present

## 2016-08-17 DIAGNOSIS — Z6841 Body Mass Index (BMI) 40.0 and over, adult: Secondary | ICD-10-CM

## 2016-08-17 DIAGNOSIS — Z9989 Dependence on other enabling machines and devices: Secondary | ICD-10-CM

## 2016-08-17 DIAGNOSIS — Z79899 Other long term (current) drug therapy: Secondary | ICD-10-CM

## 2016-08-17 DIAGNOSIS — I428 Other cardiomyopathies: Secondary | ICD-10-CM

## 2016-08-17 DIAGNOSIS — I130 Hypertensive heart and chronic kidney disease with heart failure and stage 1 through stage 4 chronic kidney disease, or unspecified chronic kidney disease: Secondary | ICD-10-CM | POA: Diagnosis not present

## 2016-08-17 DIAGNOSIS — Z7901 Long term (current) use of anticoagulants: Secondary | ICD-10-CM

## 2016-08-17 DIAGNOSIS — Z888 Allergy status to other drugs, medicaments and biological substances status: Secondary | ICD-10-CM

## 2016-08-17 DIAGNOSIS — Z90710 Acquired absence of both cervix and uterus: Secondary | ICD-10-CM

## 2016-08-17 DIAGNOSIS — E871 Hypo-osmolality and hyponatremia: Secondary | ICD-10-CM | POA: Diagnosis present

## 2016-08-17 DIAGNOSIS — I480 Paroxysmal atrial fibrillation: Secondary | ICD-10-CM | POA: Diagnosis present

## 2016-08-17 DIAGNOSIS — R42 Dizziness and giddiness: Secondary | ICD-10-CM

## 2016-08-17 DIAGNOSIS — R791 Abnormal coagulation profile: Secondary | ICD-10-CM | POA: Diagnosis present

## 2016-08-17 DIAGNOSIS — I639 Cerebral infarction, unspecified: Secondary | ICD-10-CM

## 2016-08-17 DIAGNOSIS — E785 Hyperlipidemia, unspecified: Secondary | ICD-10-CM | POA: Diagnosis present

## 2016-08-17 DIAGNOSIS — Z9119 Patient's noncompliance with other medical treatment and regimen: Secondary | ICD-10-CM

## 2016-08-17 DIAGNOSIS — Z87891 Personal history of nicotine dependence: Secondary | ICD-10-CM

## 2016-08-17 DIAGNOSIS — Z8249 Family history of ischemic heart disease and other diseases of the circulatory system: Secondary | ICD-10-CM

## 2016-08-17 DIAGNOSIS — T502X5A Adverse effect of carbonic-anhydrase inhibitors, benzothiadiazides and other diuretics, initial encounter: Secondary | ICD-10-CM | POA: Diagnosis present

## 2016-08-17 DIAGNOSIS — I429 Cardiomyopathy, unspecified: Secondary | ICD-10-CM | POA: Diagnosis present

## 2016-08-17 DIAGNOSIS — R0789 Other chest pain: Secondary | ICD-10-CM | POA: Diagnosis present

## 2016-08-17 LAB — CBC WITH DIFFERENTIAL/PLATELET
BASOS ABS: 0 10*3/uL (ref 0.0–0.1)
BASOS PCT: 0 %
EOS PCT: 1 %
Eosinophils Absolute: 0 10*3/uL (ref 0.0–0.7)
HCT: 42.3 % (ref 36.0–46.0)
Hemoglobin: 14.4 g/dL (ref 12.0–15.0)
LYMPHS ABS: 0.9 10*3/uL (ref 0.7–4.0)
Lymphocytes Relative: 30 %
MCH: 30.5 pg (ref 26.0–34.0)
MCHC: 34 g/dL (ref 30.0–36.0)
MCV: 89.6 fL (ref 78.0–100.0)
Monocytes Absolute: 0.6 10*3/uL (ref 0.1–1.0)
Monocytes Relative: 18 %
NEUTROS PCT: 51 %
Neutro Abs: 1.6 10*3/uL — ABNORMAL LOW (ref 1.7–7.7)
Platelets: 338 10*3/uL (ref 150–400)
RBC: 4.72 MIL/uL (ref 3.87–5.11)
RDW: 13 % (ref 11.5–15.5)
WBC: 3.1 10*3/uL — AB (ref 4.0–10.5)

## 2016-08-17 LAB — URINALYSIS, ROUTINE W REFLEX MICROSCOPIC
Bilirubin Urine: NEGATIVE
GLUCOSE, UA: NEGATIVE mg/dL
KETONES UR: NEGATIVE mg/dL
LEUKOCYTES UA: NEGATIVE
NITRITE: NEGATIVE
PROTEIN: NEGATIVE mg/dL
Specific Gravity, Urine: 1.01 (ref 1.005–1.030)
pH: 5 (ref 5.0–8.0)

## 2016-08-17 LAB — COMPREHENSIVE METABOLIC PANEL
ALBUMIN: 4.6 g/dL (ref 3.5–5.0)
ALT: 19 U/L (ref 14–54)
AST: 25 U/L (ref 15–41)
Alkaline Phosphatase: 103 U/L (ref 38–126)
Anion gap: 16 — ABNORMAL HIGH (ref 5–15)
BUN: 71 mg/dL — ABNORMAL HIGH (ref 6–20)
CO2: 26 mmol/L (ref 22–32)
CREATININE: 2.6 mg/dL — AB (ref 0.44–1.00)
Calcium: 9.4 mg/dL (ref 8.9–10.3)
Chloride: 89 mmol/L — ABNORMAL LOW (ref 101–111)
GFR calc non Af Amer: 19 mL/min — ABNORMAL LOW (ref >60)
GFR, EST AFRICAN AMERICAN: 22 mL/min — AB (ref >60)
GLUCOSE: 125 mg/dL — AB (ref 65–99)
Potassium: 3.4 mmol/L — ABNORMAL LOW (ref 3.5–5.1)
SODIUM: 131 mmol/L — AB (ref 135–145)
Total Bilirubin: 0.8 mg/dL (ref 0.3–1.2)
Total Protein: 8.3 g/dL — ABNORMAL HIGH (ref 6.5–8.1)

## 2016-08-17 LAB — I-STAT CHEM 8, ED
BUN: 62 mg/dL — ABNORMAL HIGH (ref 6–20)
CHLORIDE: 93 mmol/L — AB (ref 101–111)
Calcium, Ion: 0.92 mmol/L — ABNORMAL LOW (ref 1.15–1.40)
Creatinine, Ser: 2.8 mg/dL — ABNORMAL HIGH (ref 0.44–1.00)
Glucose, Bld: 128 mg/dL — ABNORMAL HIGH (ref 65–99)
HCT: 45 % (ref 36.0–46.0)
Hemoglobin: 15.3 g/dL — ABNORMAL HIGH (ref 12.0–15.0)
Potassium: 3.3 mmol/L — ABNORMAL LOW (ref 3.5–5.1)
SODIUM: 131 mmol/L — AB (ref 135–145)
TCO2: 28 mmol/L (ref 0–100)

## 2016-08-17 LAB — PROTIME-INR
INR: 1.07
PROTHROMBIN TIME: 13.9 s (ref 11.4–15.2)

## 2016-08-17 LAB — I-STAT TROPONIN, ED: Troponin i, poc: 0.01 ng/mL (ref 0.00–0.08)

## 2016-08-17 LAB — TROPONIN I

## 2016-08-17 LAB — BRAIN NATRIURETIC PEPTIDE: B Natriuretic Peptide: 21 pg/mL (ref 0.0–100.0)

## 2016-08-17 LAB — MAGNESIUM: MAGNESIUM: 2.3 mg/dL (ref 1.7–2.4)

## 2016-08-17 MED ORDER — SODIUM CHLORIDE 0.9 % IV BOLUS (SEPSIS)
500.0000 mL | Freq: Once | INTRAVENOUS | Status: AC
  Administered 2016-08-17: 500 mL via INTRAVENOUS

## 2016-08-17 MED ORDER — WARFARIN - PHARMACIST DOSING INPATIENT
Freq: Every day | Status: DC
  Administered 2016-08-19: 18:00:00

## 2016-08-17 MED ORDER — ADULT MULTIVITAMIN W/MINERALS CH: 1.0000 | ORAL_TABLET | Freq: Every day | ORAL | Status: DC

## 2016-08-17 MED ORDER — ONDANSETRON HCL 4 MG PO TABS: 4.0000 mg | ORAL_TABLET | Freq: Four times a day (QID) | ORAL | Status: DC | PRN

## 2016-08-17 MED ORDER — ACETAMINOPHEN 325 MG PO TABS: 650.0000 mg | ORAL_TABLET | Freq: Four times a day (QID) | ORAL | Status: DC | PRN

## 2016-08-17 MED ORDER — ATORVASTATIN CALCIUM 40 MG PO TABS
40.0000 mg | ORAL_TABLET | Freq: Every day | ORAL | Status: DC
  Administered 2016-08-17 – 2016-08-19 (×3): 40 mg via ORAL
  Filled 2016-08-17 (×3): qty 1

## 2016-08-17 MED ORDER — MAGNESIUM SULFATE 2 GM/50ML IV SOLN
2.0000 g | Freq: Once | INTRAVENOUS | Status: AC
  Administered 2016-08-17: 2 g via INTRAVENOUS
  Filled 2016-08-17: qty 50

## 2016-08-17 MED ORDER — ONDANSETRON HCL 4 MG/2ML IJ SOLN: 4.0000 mg | Freq: Four times a day (QID) | INTRAMUSCULAR | Status: DC | PRN

## 2016-08-17 MED ORDER — OXYCODONE HCL 5 MG PO TABS: 5.0000 mg | ORAL_TABLET | ORAL | Status: DC | PRN

## 2016-08-17 MED ORDER — LEVOTHYROXINE SODIUM 25 MCG PO TABS
25.0000 ug | ORAL_TABLET | Freq: Every day | ORAL | Status: DC
  Administered 2016-08-17 – 2016-08-20 (×4): 25 ug via ORAL
  Filled 2016-08-17 (×4): qty 1

## 2016-08-17 MED ORDER — OMEGA-3-ACID ETHYL ESTERS 1 G PO CAPS
1.0000 g | ORAL_CAPSULE | Freq: Every day | ORAL | Status: DC
  Administered 2016-08-17 – 2016-08-20 (×4): 1 g via ORAL
  Filled 2016-08-17 (×4): qty 1

## 2016-08-17 MED ORDER — ONDANSETRON HCL 4 MG/2ML IJ SOLN
4.0000 mg | Freq: Once | INTRAMUSCULAR | Status: AC
  Administered 2016-08-17: 4 mg via INTRAVENOUS
  Filled 2016-08-17: qty 2

## 2016-08-17 MED ORDER — POTASSIUM CHLORIDE CRYS ER 20 MEQ PO TBCR
40.0000 meq | EXTENDED_RELEASE_TABLET | Freq: Once | ORAL | Status: AC
  Administered 2016-08-17: 40 meq via ORAL
  Filled 2016-08-17: qty 2

## 2016-08-17 MED ORDER — ACETAMINOPHEN 650 MG RE SUPP: 650.0000 mg | Freq: Four times a day (QID) | RECTAL | Status: DC | PRN

## 2016-08-17 MED ORDER — SODIUM CHLORIDE 0.9% FLUSH
3.0000 mL | Freq: Two times a day (BID) | INTRAVENOUS | Status: DC
  Administered 2016-08-17 – 2016-08-19 (×4): 3 mL via INTRAVENOUS

## 2016-08-17 MED ORDER — MORPHINE SULFATE (PF) 4 MG/ML IV SOLN
4.0000 mg | INTRAVENOUS | Status: DC | PRN
Start: 2016-08-17 — End: 2016-08-18
  Administered 2016-08-17: 4 mg via INTRAVENOUS
  Filled 2016-08-17: qty 1

## 2016-08-17 MED ORDER — SODIUM CHLORIDE 0.9 % IV SOLN
30.0000 meq | Freq: Once | INTRAVENOUS | Status: DC
  Filled 2016-08-17: qty 15

## 2016-08-17 MED ORDER — ESTRADIOL 2 MG PO TABS
2.0000 mg | ORAL_TABLET | Freq: Every day | ORAL | Status: DC
  Administered 2016-08-17 – 2016-08-20 (×4): 2 mg via ORAL
  Filled 2016-08-17 (×5): qty 1

## 2016-08-17 MED ORDER — CELECOXIB 200 MG PO CAPS
200.0000 mg | ORAL_CAPSULE | ORAL | Status: DC | PRN
  Filled 2016-08-17: qty 1

## 2016-08-17 MED ORDER — ZOLPIDEM TARTRATE 5 MG PO TABS: 5.0000 mg | ORAL_TABLET | Freq: Every evening | ORAL | Status: DC | PRN

## 2016-08-17 MED ORDER — ENSURE ENLIVE PO LIQD
237.0000 mL | Freq: Two times a day (BID) | ORAL | Status: DC
  Administered 2016-08-17 – 2016-08-18 (×3): 237 mL via ORAL

## 2016-08-17 MED ORDER — WARFARIN SODIUM 7.5 MG PO TABS
7.5000 mg | ORAL_TABLET | Freq: Once | ORAL | Status: AC
  Administered 2016-08-17: 7.5 mg via ORAL
  Filled 2016-08-17: qty 1

## 2016-08-17 NOTE — Telephone Encounter (Signed)
Pt was in today to see Richardson Dopp, PA-C.  While in lobby pt became unresponsive and triage was contacted to respond.  DOD,Dr. Marlou Porch made aware.  Upon arrival pt was disoriented general weakness, couldn't verbalize needs and was favoring her right side.  BP 140/82, HR 110, GBS 163, O2 RA 92%, and temp 97.0.  Pt given ASA 81mg  and was unable to chew and swallow.  911 called with emergency status.  Dr. Marlou Porch and Richardson Dopp, PA-C at pt's side for assessing.  Pt began to become more alert and answer some questions.  Pt c/o numbess on the right side of her face.  EMS arrived and pt was transported to [facility].  Will route to Dr. Marlou Porch and Dr. Claiborne Billings (Primary Cardiologist).

## 2016-08-17 NOTE — ED Notes (Addendum)
Pt c/o being hot and sweaty. Pt became anxious and c/o "not being able to breathe." RN coached pt through deep breathing and applied 3L of oxygen via [place]. After 1-2 minutes pt seemed to calm down and is now resting on stretcher with call bell within reach. Family at bedside.

## 2016-08-17 NOTE — Progress Notes (Signed)
Pt states she had orthostatic vital signs completed a in ER. Per charting at 1517. Pt states she wants to rest at this time, did not repeat. Pt states she has had zero medications today. Pharmacy tech aware.   Tiran Sauseda Leory Plowman

## 2016-08-17 NOTE — ED Provider Notes (Signed)
[facility] DEPT Provider Note   CSN: [id number] Arrival date & time: 08/17/16  1157     History   Chief Complaint Chief Complaint  Patient presents with  . Near Syncope    HPI Caitlyn Fuentes is a 61 y.o. female. Chief complaint is continued shortness of breath, muscle pain and spasms, weakness, and near-syncopal.  HPI:  Patient presents for evaluation after nearly passing out at her 72 office today.  History of mild chronic renal disease. History of CHF. Follows with Dr. Claiborne Billings, Dr. lab, Dr. Maudie Mercury.  Had exacerbation of her shortness of breath 1 week ago. Seen and evaluated and had treatment at [facility] at [facility] She had pulmonary edema and elevated BNP. She received Lasix and had marked diuresis and improvement. She reiterates to me that she was offered admission at that time but felt well and decided to return home. She felt well for 48 hours. She saw her physician on Monday. She was given Zaroxolyn on Monday and Wednesday prior to her torsemide doses and had marked continue diuresis. For the last 24 hours and lightheaded and weak. She's had abdominal and peripheral cramps. She is at her 59 office today and after climbing up stairs felt lightheaded and was near syncopal. Did not have syncope. Was brought here for evaluation.  She states she has lost 20 pounds in the last 7 days.  Past Medical History:  Diagnosis Date  . A-fib ([place])   . Cardiomyopathy   . Hyperlipidemia   . Hypertension   . Lupus   . Pulmonary hypertension ([place]) 05/10/2011   Echo, EF-40-45  . Renal disorder   . Sleep apnea 2008   CPAP, pt does not know settings  . Thyroid disease     Patient Active Problem List   Diagnosis Date Noted  . Acute kidney injury ([place]) 08/17/2016  . Dizziness 08/17/2016  . Acute hypokalemia 08/17/2016  . Acute hyponatremia 08/17/2016  . Chest tightness 08/17/2016  . Right facial numbness 08/17/2016  . Nonischemic cardiomyopathy  ([place]) 05/25/2015  . Chronic combined systolic and diastolic heart failure ([place]) 05/25/2015  . Palpitations 05/25/2015  . Gout 10/16/2013  . Hyperlipidemia 10/16/2013  . Long term current use of anticoagulant therapy 07/20/2013  . Chest pain, atypical 08/16/2011  . Morbid obesity ([place]) 08/16/2011  . Bilateral lower extremity edema 08/16/2011  . Obstructive sleep apnea on CPAP 08/16/2011  . Paroxysmal atrial fibrillation ([place]) 08/16/2011    Past Surgical History:  Procedure Laterality Date  .  c sections  x 2  . ABDOMINAL HYSTERECTOMY     complete  . CARDIAC CATHETERIZATION     Nonischemic, EF-40, continued medical therapy  . CARDIAC CATHETERIZATION  10/02/2006   No significant CAD  . COLONOSCOPY WITH PROPOFOL N/A 11/22/2015   Procedure: COLONOSCOPY WITH PROPOFOL;  Surgeon: Juanita Craver, MD;  Location: [facility];  Service: Endoscopy;  Laterality: N/A;  . ESOPHAGOGASTRODUODENOSCOPY (EGD) WITH PROPOFOL N/A 11/22/2015   Procedure: ESOPHAGOGASTRODUODENOSCOPY (EGD) WITH PROPOFOL;  Surgeon: Juanita Craver, MD;  [facility];  Service: Endoscopy;  Laterality: N/A;    OB History    No data available       Home Medications    Prior to Admission medications   Medication Sig Start Date End Date Taking? Authorizing Provider  allopurinol (ZYLOPRIM) 100 MG tablet Take 200 mg by mouth daily.  07/09/13   Historical Provider, MD  apixaban (ELIQUIS) 5 MG TABS tablet Take 1 tablet (5 mg total) by mouth 2 (two)  times daily. 07/23/16   Troy Sine, MD  atorvastatin (LIPITOR) 40 MG tablet TAKE 1 TABLET (40 MG TOTAL) BY MOUTH DAILY. 12/21/15   Troy Sine, MD  carvedilol (COREG) 12.5 MG tablet Take 1 tablet (12.5 mg total) by mouth 2 (two) times daily. 07/23/16 10/21/16  Troy Sine, MD  celecoxib (CELEBREX) 200 MG capsule Take 200 mg by mouth as needed for mild pain.  03/16/15   Historical Provider, MD  colchicine 0.6 MG tablet Take 0.6 mg by mouth daily.    Historical Provider, MD    estradiol (ESTRACE) 2 MG tablet Take 2 mg by mouth daily.  03/22/14   Historical Provider, MD  hydrALAZINE (APRESOLINE) 25 MG tablet TAKE 1 TABLET (25 MG TOTAL) BY MOUTH 2 (TWO) TIMES DAILY. 07/02/16   Troy Sine, MD  levothyroxine (SYNTHROID, LEVOTHROID) 25 MCG tablet Take 25 mcg by mouth daily. 07/14/14   Historical Provider, MD  metolazone (ZAROXOLYN) 2.5 MG tablet Take 1 tablet (2.5 mg total) by mouth as directed. 08/13/16 11/11/16  Liliane Shi, PA-C  Multiple Vitamin (MULITIVITAMIN WITH MINERALS) TABS Take 1 tablet by mouth daily.    Historical Provider, MD  omega-3 acid ethyl esters (LOVAZA) 1 g capsule Take 1 g by mouth daily.    Historical Provider, MD  oxyCODONE (ROXICODONE) 5 MG immediate release tablet Take 1 tablet (5 mg total) by mouth every 4 (four) hours as needed for severe pain. 02/28/16   Deno Etienne, DO  Potassium Chloride ER 20 MEQ TBCR Take 40 mEq by mouth as directed. Take only when you take Metolazone 08/13/16   Liliane Shi, PA-C  torsemide (DEMADEX) 20 MG tablet TAKE 2 (TWO) TABLETS BY MOUTH TWICE DAILY    Historical Provider, MD  zolpidem (AMBIEN) 10 MG tablet Take 10 mg by mouth at bedtime as needed for sleep.     Historical Provider, MD    Family History Family History  Problem Relation Age of Onset  . Heart disease Mother   . Heart disease Father   . Lung disease Sister   . Heart disease Brother   . Hypertension Sister     Social History Social History  Substance Use Topics  . Smoking status: Former Smoker    Quit date: 12/25/2008  . Smokeless tobacco: Never Used  . Alcohol use 0.0 oz/week     Comment: once a month     Allergies   Diovan [valsartan] and Lisinopril   Review of Systems Review of Systems  Constitutional: Negative for appetite change, chills, diaphoresis, fatigue and fever.  HENT: Negative for mouth sores, sore throat and trouble swallowing.   Eyes: Negative for visual disturbance.  Respiratory: Positive for shortness of breath.  Negative for cough, chest tightness and wheezing.   Cardiovascular: Negative for chest pain.  Gastrointestinal: Negative for abdominal distention, abdominal pain, diarrhea, nausea and vomiting.  Endocrine: Negative for polydipsia, polyphagia and polyuria.  Genitourinary: Negative for dysuria, frequency and hematuria.  Musculoskeletal: Positive for myalgias. Negative for gait problem.  Skin: Negative for color change, pallor and rash.  Neurological: Positive for syncope and weakness. Negative for dizziness, light-headedness and headaches.  Hematological: Does not bruise/bleed easily.  Psychiatric/Behavioral: Negative for behavioral problems and confusion.     Physical Exam Updated Vital Signs BP 129/77   Pulse 80   Temp 97.8 F (36.6 C) (Oral)   Resp 17   SpO2 97%   Physical Exam  Constitutional: She is oriented to person, place, and time. She  appears well-developed and well-nourished. No distress.  Obese black female. Awake and alert. Not dyspneic with conversation  HENT:  Head: Normocephalic.  Eyes: Conjunctivae are normal. Pupils are equal, round, and reactive to light. No scleral icterus.  Neck: Normal range of motion. Neck supple. No thyromegaly present.  Cardiovascular: Normal rate and regular rhythm.  Exam reveals no gallop and no friction rub.   No murmur heard. Pulmonary/Chest: Effort normal and breath sounds normal. No respiratory distress. She has no wheezes. She has no rales.  Clear bilateral breath sounds.  Abdominal: Soft. Bowel sounds are normal. She exhibits no distension. There is no tenderness. There is no rebound.  Musculoskeletal: Normal range of motion.  Neurological: She is alert and oriented to person, place, and time.  Skin: Skin is warm and dry. No rash noted.  No lower extremity edema. She states she has lost 20 pounds and her edema went away. She has chronic leg tenderness that she attributes to "the venous insufficiency".  Psychiatric: She has a normal  mood and affect. Her behavior is normal.     ED Treatments / Results  Labs (all labs ordered are listed, but only abnormal results are displayed) Labs Reviewed  CBC WITH DIFFERENTIAL/PLATELET - Abnormal; Notable for the following:       Result Value   WBC 3.1 (*)    Neutro Abs 1.6 (*)    All other components within normal limits  COMPREHENSIVE METABOLIC PANEL - Abnormal; Notable for the following:    Sodium 131 (*)    Potassium 3.4 (*)    Chloride 89 (*)    Glucose, Bld 125 (*)    BUN 71 (*)    Creatinine, Ser 2.60 (*)    Total Protein 8.3 (*)    GFR calc non Af Amer 19 (*)    GFR calc Af Amer 22 (*)    Anion gap 16 (*)    All other components within normal limits  URINALYSIS, ROUTINE W REFLEX MICROSCOPIC - Abnormal; Notable for the following:    Hgb urine dipstick SMALL (*)    Bacteria, UA RARE (*)    Squamous Epithelial / LPF 0-5 (*)    All other components within normal limits  I-STAT CHEM 8, ED - Abnormal; Notable for the following:    Sodium 131 (*)    Potassium 3.3 (*)    Chloride 93 (*)    BUN 62 (*)    Creatinine, Ser 2.80 (*)    Glucose, Bld 128 (*)    Calcium, Ion 0.92 (*)    Hemoglobin 15.3 (*)    All other components within normal limits  BRAIN NATRIURETIC PEPTIDE  MAGNESIUM  TROPONIN I  TROPONIN I  TROPONIN I  I-STAT CG4 LACTIC ACID, ED  Randolm Idol, ED    EKG  EKG Interpretation None       Radiology Dg Chest Port 1 View  Result Date: 08/17/2016 CLINICAL DATA:  Sharp chest pain EXAM: PORTABLE CHEST 1 VIEW COMPARISON:  08/10/2016 FINDINGS: Mild cardiomegaly. Lungs are clear. No effusions or edema. No acute bony abnormality. IMPRESSION: Cardiomegaly.  No active disease. Electronically Signed   By: Rolm Baptise M.D.   On: 08/17/2016 12:48    Procedures Procedures (including critical care time)  Medications Ordered in ED Medications  morphine 4 MG/ML injection 4 mg (4 mg Intravenous Given 08/17/16 1302)  magnesium sulfate IVPB 2 g 50 mL  (2 g Intravenous New Bag/Given 08/17/16 1354)  potassium chloride SA (K-DUR,KLOR-CON) CR tablet 40  mEq (not administered)  ondansetron (ZOFRAN) injection 4 mg (4 mg Intravenous Given 08/17/16 1302)     Initial Impression / Assessment and Plan / ED Course  I have reviewed the triage vital signs and the nursing notes.  Pertinent labs & imaging results that were available during my care of the patient were reviewed by me and considered in my medical decision making (see chart for details).     Patient hypokalemia. Awaiting magnesium. Replacing magnesium and potassium. BNP has improved, chest x-ray shows no edema. Has doubled her creatinine is now an acute kidney injury coupled with mild chronic renal insufficiency. Will require admission. Discussed with hospitalist.  Final Clinical Impressions(s) / ED Diagnoses   Final diagnoses:  Near syncope  Acute kidney injury ([place])  Hypokalemia    New Prescriptions New Prescriptions   No medications on file     Tanna Furry, MD 08/17/16 1452

## 2016-08-17 NOTE — Progress Notes (Signed)
Called RT regarding pt requesting CPAP to rest. Respiratory therapy stated they will be up shortly to deliver.   Octavie Westerhold Leory Plowman

## 2016-08-17 NOTE — Progress Notes (Signed)
[place] for warfarin Indication: atrial fibrillation  Allergies  Allergen Reactions  . Diovan [Valsartan] Other (See Comments)    Messes up kidneys  . Lisinopril Swelling    Facial swelling    Patient Measurements: Height: 5' 3.5" (161.3 cm) Weight: 254 lb 1.6 oz (115.3 kg) IBW/kg (Calculated) : 53.55  Vital Signs: Temp: 98 F (36.7 C) (04/27 1636) Temp Source: Oral (04/27 1636) BP: 140/62 (04/27 1636) Pulse Rate: 76 (04/27 1636)  Labs:  Recent Labs  08/17/16 1250 08/17/16 1315 08/17/16 1658 08/17/16 1659  HGB 14.4 15.3*  --   --   HCT 42.3 45.0  --   --   PLT 338  --   --   --   LABPROT  --   --  13.9  --   INR  --   --  1.07  --   CREATININE 2.60* 2.80*  --   --   TROPONINI  --   --   --  <0.03    Estimated Creatinine Clearance: 26.4 mL/min (A) (by C-G formula based on SCr of 2.8 mg/dL (H)).   Assessment: 61 yo female with PMHx of atrial fibrillation on warfarin prior to admission. Historically her compliance with warfarin has been poor and cardiology planned/intended for her to transition to apixaban 5 mg BID on 07/23/16 (gave her samples in office). However, pt reports that she has not yet begun taking apixaban as she wanted to finish her supply of warfarin first. Admit INR 1.07, last dose unknown. CBC stable.   Goal of Therapy:  INR 2-3 Monitor platelets by anticoagulation protocol: Yes   Plan:  1. Warfarin 7.5 mg x 1 this evening  2. Daily INR  3. ? If AKI resolves transition to apixaban while inpatient  Vincenza Hews, PharmD, BCPS 08/17/2016, 7:01 PM

## 2016-08-17 NOTE — ED Triage Notes (Signed)
Went to her MD today for a follow up visit and experienced a near syncopal event as a period of unresponsiveness with a long stare for approx 5 min.  tingling on the R side of her face felt after the event

## 2016-08-17 NOTE — H&P (Signed)
History and Physical    Caitlyn Fuentes CNO:[id number] DOB: [birth year] DOA: 08/17/2016   PCP: [name], MD   Patient coming from/Resides with: Private residence/husband and son  Admission status: Observation/telemetry -it may be medically necessary to stay a minimum 2 midnights to rule out impending and/or unexpected changes in physiologic status that may differ from initial evaluation performed in the ER and/or at time of admission-consider reevaluation of admission status in 24 hours.   Chief Complaint: Near syncope  HPI: Caitlyn Fuentes is a 61 y.o. female with medical history significant for obesity, NICM with EF 45% and associated diastolic dysfunction, PAF on eliquis, sleep apnea, and HLD. Patient went to a physician visit today and had a near syncopal episode described as being unresponsive with a long stay there for approximately 5 minutes and reported prolonged/several hours of right-sided facial tingling and eye watering without any other focal neurological symptoms. Subsequently the symptoms have resolved. In the ER patient was found to have an acute kidney injury. She had been seen on 4/20 in the ER for heart failure symptoms and was given IV Lasix. She subsequent followed up with cardiology on 4/23 and was placed on prn metolazone and has taken 3 doses of this since prescribed. Since onset of symptoms on 4/20 patient's had a dry nonproductive cough she is a tightness in the top of her chest without any other associated symptoms. She's had persistent orthopnea and dyspnea on exertion.  ED Course:  Vital Signs: BP 129/77   Pulse 80   Temp 97.8 F (36.6 C) (Oral)   Resp 17   SpO2 97%  PCXR: Neg Lab data: Sodium 131, potassium 3.4, chloride 89, CO2 26, glucose 125, BUN 71, creatinine 2.6, LFTs normal, BNP 21, poc troponin 0.01, white count 3100 with normal differential, hemoglobin 14.4, platelets 338,000; urinalysis unremarkable Medications and treatments: Morphine 4 mg IV 1,  Zofran 4 mg IV 1, magnesium 2 g IV 1   Review of Systems:  In addition to the HPI above,  No Fever-chills, myalgias or other constitutional symptoms No Headache, changes with Vision or hearing, new weakness, numbness in any extremity, dysarthria or word finding difficulty, gait disturbance or imbalance, tremors or seizure activity No problems swallowing food or Liquids, indigestion/reflux, choking or coughing while eating, abdominal pain with or after eating No palpitations No Abdominal pain, N/V, melena,hematochezia, dark tarry stools, constipation No dysuria, malodorous urine, hematuria or flank pain No new skin rashes, lesions, masses or bruises, No new joint pains, aches, swelling or redness No recent unintentional weight gain or loss No polyuria, polydypsia or polyphagia   Past Medical History:  Diagnosis Date  . A-fib ([place])   . Cardiomyopathy   . Hyperlipidemia   . Hypertension   . Lupus   . Pulmonary hypertension ([place]) 05/10/2011   Echo, EF-40-45  . Renal disorder   . Sleep apnea 2008   CPAP, pt does not know settings  . Thyroid disease     Past Surgical History:  Procedure Laterality Date  .  c sections  x 2  . ABDOMINAL HYSTERECTOMY     complete  . CARDIAC CATHETERIZATION     Nonischemic, EF-40, continued medical therapy  . CARDIAC CATHETERIZATION  10/02/2006   No significant CAD  . COLONOSCOPY WITH PROPOFOL N/A 11/22/2015   Procedure: COLONOSCOPY WITH PROPOFOL;  Surgeon: Juanita Craver, MD;  Location: [facility];  Service: Endoscopy;  Laterality: N/A;  . ESOPHAGOGASTRODUODENOSCOPY (EGD) WITH PROPOFOL N/A 11/22/2015   Procedure: ESOPHAGOGASTRODUODENOSCOPY (EGD) WITH PROPOFOL;  Surgeon: Juanita Craver, MD;  Location: [facility];  Service: Endoscopy;  Laterality: N/A;    Social History   Social History  . Marital status: Married    Spouse name: N/A  . Number of children: N/A  . Years of education: N/A   Occupational History  . Not on file.   Social History  Main Topics  . Smoking status: Former Smoker    Quit date: 12/25/2008  . Smokeless tobacco: Never Used  . Alcohol use 0.0 oz/week     Comment: once a month  . Drug use: No  . Sexual activity: Not on file   Other Topics Concern  . Not on file   Social History Narrative  . No narrative on file    Mobility: Independent Work history: Not obtained   Allergies  Allergen Reactions  . Diovan [Valsartan] Other (See Comments)    Messes up kidneys  . Lisinopril Swelling    Facial swelling    Family History  Problem Relation Age of Onset  . Heart disease Mother   . Heart disease Father   . Lung disease Sister   . Heart disease Brother   . Hypertension Sister      Prior to Admission medications   Medication Sig Start Date End Date Taking? Authorizing Provider  allopurinol (ZYLOPRIM) 100 MG tablet Take 200 mg by mouth daily.  07/09/13   Historical Provider, MD  apixaban (ELIQUIS) 5 MG TABS tablet Take 1 tablet (5 mg total) by mouth 2 (two) times daily. 07/23/16   Troy Sine, MD  atorvastatin (LIPITOR) 40 MG tablet TAKE 1 TABLET (40 MG TOTAL) BY MOUTH DAILY. 12/21/15   Troy Sine, MD  carvedilol (COREG) 12.5 MG tablet Take 1 tablet (12.5 mg total) by mouth 2 (two) times daily. 07/23/16 10/21/16  Troy Sine, MD  celecoxib (CELEBREX) 200 MG capsule Take 200 mg by mouth as needed for mild pain.  03/16/15   Historical Provider, MD  colchicine 0.6 MG tablet Take 0.6 mg by mouth daily.    Historical Provider, MD  estradiol (ESTRACE) 2 MG tablet Take 2 mg by mouth daily.  03/22/14   Historical Provider, MD  hydrALAZINE (APRESOLINE) 25 MG tablet TAKE 1 TABLET (25 MG TOTAL) BY MOUTH 2 (TWO) TIMES DAILY. 07/02/16   Troy Sine, MD  levothyroxine (SYNTHROID, LEVOTHROID) 25 MCG tablet Take 25 mcg by mouth daily. 07/14/14   Historical Provider, MD  metolazone (ZAROXOLYN) 2.5 MG tablet Take 1 tablet (2.5 mg total) by mouth as directed. 08/13/16 11/11/16  Liliane Shi, PA-C  Multiple Vitamin  (MULITIVITAMIN WITH MINERALS) TABS Take 1 tablet by mouth daily.    Historical Provider, MD  omega-3 acid ethyl esters (LOVAZA) 1 g capsule Take 1 g by mouth daily.    Historical Provider, MD  oxyCODONE (ROXICODONE) 5 MG immediate release tablet Take 1 tablet (5 mg total) by mouth every 4 (four) hours as needed for severe pain. 02/28/16   Deno Etienne, DO  Potassium Chloride ER 20 MEQ TBCR Take 40 mEq by mouth as directed. Take only when you take Metolazone 08/13/16   Liliane Shi, PA-C  torsemide (DEMADEX) 20 MG tablet TAKE 2 (TWO) TABLETS BY MOUTH TWICE DAILY    Historical Provider, MD  zolpidem (AMBIEN) 10 MG tablet Take 10 mg by mouth at bedtime as needed for sleep.     Historical Provider, MD    Physical Exam: Vitals:   08/17/16 1219 08/17/16 1230 08/17/16 1330 08/17/16 1430  BP: (!) 127/101 (!) 134/107 122/76 129/77  Pulse: (!) 103 100 74 80  Resp: (!) 29 (!) 25 16 17   Temp: 97.8 F (36.6 C)     TempSrc: Oral     SpO2: 95% 94% 94% 97%      Constitutional: NAD, calm, comfortable Eyes: PERRL, lids and conjunctivae normal ENMT: Mucous membranes are moist. Posterior pharynx clear of any exudate or lesions.Normal dentition.  Neck: normal, supple, no masses, no thyromegaly Respiratory: clear to auscultation bilaterally, no wheezing, no crackles. Normal respiratory effort. No accessory muscle use.  Cardiovascular: Regular rate and rhythm, no murmurs / rubs / gallops. No extremity edema. 2+ pedal pulses. No carotid bruits.  Abdomen: no tenderness, no masses palpated. No hepatosplenomegaly. Bowel sounds positive.  Musculoskeletal: no clubbing / cyanosis. No joint deformity upper and lower extremities. Good ROM, no contractures. Normal muscle tone.  Skin: no rashes, lesions, ulcers. No induration Neurologic: CN 2-12 grossly intact. Sensation intact, DTR normal. Strength 5/5 x all 4 extremities. Has hyperaesthesic pain response in bilateral lower extremities from knees to feet Psychiatric:  Normal judgment and insight. Alert and oriented x 3. Normal mood.    Labs on Admission: I have personally reviewed following labs and imaging studies  CBC:  Recent Labs Lab 08/10/16 2219 08/17/16 1250 08/17/16 1315  WBC 4.3 3.1*  --   NEUTROABS 2.3 1.6*  --   HGB 12.5 14.4 15.3*  HCT 37.1 42.3 45.0  MCV 91.4 89.6  --   PLT 332 338  --    Basic Metabolic Panel:  Recent Labs Lab 08/10/16 2219 08/17/16 1250 08/17/16 1315  NA 140 131* 131*  K 3.6 3.4* 3.3*  CL 107 89* 93*  CO2 22 26  --   GLUCOSE 98 125* 128*  BUN 13 71* 62*  CREATININE 1.17* 2.60* 2.80*  CALCIUM 8.9 9.4  --   MG  --  2.3  --    GFR: Estimated Creatinine Clearance: 27.1 mL/min (A) (by C-G formula based on SCr of 2.8 mg/dL (H)). Liver Function Tests:  Recent Labs Lab 08/10/16 2219 08/17/16 1250  AST 21 25  ALT 18 19  ALKPHOS 93 103  BILITOT 0.7 0.8  PROT 7.3 8.3*  ALBUMIN 3.9 4.6   No results for input(s): LIPASE, AMYLASE in the last 168 hours. No results for input(s): AMMONIA in the last 168 hours. Coagulation Profile:  Recent Labs Lab 08/10/16 2219  INR 1.35   Cardiac Enzymes:  Recent Labs Lab 08/10/16 2219  TROPONINI <0.03   BNP (last 3 results) No results for input(s): PROBNP in the last 8760 hours. HbA1C: No results for input(s): HGBA1C in the last 72 hours. CBG: No results for input(s): GLUCAP in the last 168 hours. Lipid Profile: No results for input(s): CHOL, HDL, LDLCALC, TRIG, CHOLHDL, LDLDIRECT in the last 72 hours. Thyroid Function Tests: No results for input(s): TSH, T4TOTAL, FREET4, T3FREE, THYROIDAB in the last 72 hours. Anemia Panel: No results for input(s): VITAMINB12, FOLATE, FERRITIN, TIBC, IRON, RETICCTPCT in the last 72 hours. Urine analysis:    Component Value Date/Time   COLORURINE YELLOW 08/17/2016 1250   APPEARANCEUR CLEAR 08/17/2016 1250   LABSPEC 1.010 08/17/2016 1250   PHURINE 5.0 08/17/2016 1250   GLUCOSEU NEGATIVE 08/17/2016 1250   HGBUR  SMALL (A) 08/17/2016 1250   BILIRUBINUR NEGATIVE 08/17/2016 1250   KETONESUR NEGATIVE 08/17/2016 1250   PROTEINUR NEGATIVE 08/17/2016 1250   UROBILINOGEN 0.2 09/12/2009 2020   NITRITE NEGATIVE 08/17/2016 1250   LEUKOCYTESUR NEGATIVE  08/17/2016 1250   Sepsis Labs: @LABRCNTIP (procalcitonin:4,lacticidven:4) )No results found for this or any previous visit (from the past 240 hour(s)).   Radiological Exams on Admission: Dg Chest Port 1 View  Result Date: 08/17/2016 CLINICAL DATA:  Sharp chest pain EXAM: PORTABLE CHEST 1 VIEW COMPARISON:  08/10/2016 FINDINGS: Mild cardiomegaly. Lungs are clear. No effusions or edema. No acute bony abnormality. IMPRESSION: Cardiomegaly.  No active disease. Electronically Signed   By: Rolm Baptise M.D.   On: 08/17/2016 12:48     Assessment/Plan Principal Problem:   Near syncope/right facial numbness/dizziness -Patient presents with near syncopal symptoms witnessed at primary care physician office associated with dizziness and right facial tingling and numbness of several hours duration now resolved -Likely secondary to buying depletion and suspected orthostatic hypotension and patient with acute kidney injury after overdiuresis -Neurological exam currently normal -For completeness of evaluation will obtain CT of head without contrast -Echocardiogram -Telemetry -Mobilize with assistance only -Orthostatic vital signs  Active Problems:   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 If  truly edema versus body mass gain -Holding antihypertensive medications as well as diuretics in setting of acute kidney injury (see below) -Follow up on echo as above -Of note patient admits noncompliance both with CPAP as well as with carvedilol-this can contribute to failure of medical management of heart failure symptoms    Acute kidney injury  -Baseline renal function normal: 13 and 1.17 -Current renal function 71 and 2.6 -Not orthostatic so will give NS 500 cc x1 and encourage oral fluids -Currently normotensive-avoid hypotension -Labs in a.m.    Chest tightness -Patient reports ongoing for 1-1/2 weeks primarily located in the upper portion of the chest -Negative stress Myoview May 2013 -Normal LHC May 2011 -Cycle troponins and follow up on echo as above -Home beta blocker on hold -Continue statin and omega-3 fatty acids -Not on aspirin prior to admission likely secondary requirement of full dose anticoagulation    Paroxysmal atrial fibrillation  -Maintaining sinus rhythm -Continue anticoagulation; pharmacy to clarify whether patient has transitioned from Coumadin to eliquis -CHADVASc = 3    Acute hypokalemia/Acute hyponatremia -In setting of acute dehydration as well as acute kidney injury -Oral replacement of potassium -Follow labs    Morbid obesity/Obstructive sleep apnea on CPAP -Nocturnal CPAP    Hyperlipidemia -Continue statin and omega-3 fatty acids      DVT prophylaxis: Coumadin/Eliquis Code Status: Full  Family Communication: Husband and son Disposition Plan: Home Consults called: None    Eviana Sibilia L. ANP-BC [facility] Pager [phone]   If 7PM-7AM, please contact night-coverage [email or web address] Password [facility]  08/17/2016, 2:53 PM

## 2016-08-18 ENCOUNTER — Observation Stay (HOSPITAL_COMMUNITY): Payer: 59

## 2016-08-18 DIAGNOSIS — R55 Syncope and collapse: Secondary | ICD-10-CM

## 2016-08-18 LAB — COMPREHENSIVE METABOLIC PANEL
ALBUMIN: 4.4 g/dL (ref 3.5–5.0)
ALT: 16 U/L (ref 14–54)
AST: 25 U/L (ref 15–41)
Alkaline Phosphatase: 95 U/L (ref 38–126)
Anion gap: 15 (ref 5–15)
BUN: 69 mg/dL — AB (ref 6–20)
CO2: 27 mmol/L (ref 22–32)
Calcium: 9.5 mg/dL (ref 8.9–10.3)
Chloride: 92 mmol/L — ABNORMAL LOW (ref 101–111)
Creatinine, Ser: 2.69 mg/dL — ABNORMAL HIGH (ref 0.44–1.00)
GFR calc Af Amer: 21 mL/min — ABNORMAL LOW (ref >60)
GFR calc non Af Amer: 18 mL/min — ABNORMAL LOW (ref >60)
GLUCOSE: 113 mg/dL — AB (ref 65–99)
POTASSIUM: 3.8 mmol/L (ref 3.5–5.1)
Sodium: 134 mmol/L — ABNORMAL LOW (ref 135–145)
Total Bilirubin: 0.7 mg/dL (ref 0.3–1.2)
Total Protein: 7.9 g/dL (ref 6.5–8.1)

## 2016-08-18 LAB — CBC
HEMATOCRIT: 41.3 % (ref 36.0–46.0)
Hemoglobin: 14.1 g/dL (ref 12.0–15.0)
MCH: 30.9 pg (ref 26.0–34.0)
MCHC: 34.1 g/dL (ref 30.0–36.0)
MCV: 90.4 fL (ref 78.0–100.0)
Platelets: 323 10*3/uL (ref 150–400)
RBC: 4.57 MIL/uL (ref 3.87–5.11)
RDW: 13.2 % (ref 11.5–15.5)
WBC: 3.8 10*3/uL — AB (ref 4.0–10.5)

## 2016-08-18 LAB — LIPID PANEL
CHOL/HDL RATIO: 3.2 ratio
Cholesterol: 126 mg/dL (ref 0–200)
HDL: 39 mg/dL — AB (ref >40)
LDL Cholesterol: 51 mg/dL (ref 0–99)
Triglycerides: 178 mg/dL — ABNORMAL HIGH (ref <150)
VLDL: 36 mg/dL (ref 0–40)

## 2016-08-18 LAB — GLUCOSE, CAPILLARY: Glucose-Capillary: 111 mg/dL — ABNORMAL HIGH (ref 65–99)

## 2016-08-18 LAB — HEPARIN LEVEL (UNFRACTIONATED): Heparin Unfractionated: 0.7 IU/mL (ref 0.30–0.70)

## 2016-08-18 LAB — PROTIME-INR
INR: 1.06
INR: 1.08
PROTHROMBIN TIME: 13.8 s (ref 11.4–15.2)
PROTHROMBIN TIME: 14 s (ref 11.4–15.2)

## 2016-08-18 LAB — TROPONIN I: Troponin I: <0.03 ng/mL (ref <0.03)

## 2016-08-18 MED ORDER — ASPIRIN 81 MG PO CHEW
81.0000 mg | CHEWABLE_TABLET | Freq: Once | ORAL | Status: AC
  Administered 2016-08-18: 81 mg via ORAL
  Filled 2016-08-18: qty 1

## 2016-08-18 MED ORDER — LORAZEPAM 2 MG/ML IJ SOLN
1.0000 mg | Freq: Once | INTRAMUSCULAR | Status: AC
  Administered 2016-08-18: 1 mg via INTRAVENOUS
  Filled 2016-08-18: qty 1

## 2016-08-18 MED ORDER — WARFARIN SODIUM 7.5 MG PO TABS
7.5000 mg | ORAL_TABLET | Freq: Once | ORAL | Status: AC
  Administered 2016-08-18: 7.5 mg via ORAL
  Filled 2016-08-18: qty 1

## 2016-08-18 MED ORDER — SODIUM CHLORIDE 0.9 % IV SOLN
INTRAVENOUS | Status: AC
  Administered 2016-08-18 – 2016-08-19 (×2): via INTRAVENOUS

## 2016-08-18 MED ORDER — CARVEDILOL 3.125 MG PO TABS
3.1250 mg | ORAL_TABLET | Freq: Two times a day (BID) | ORAL | Status: DC
  Administered 2016-08-18 – 2016-08-20 (×5): 3.125 mg via ORAL
  Filled 2016-08-18 (×5): qty 1

## 2016-08-18 MED ORDER — HEPARIN (PORCINE) IN NACL 100-0.45 UNIT/ML-% IJ SOLN
1200.0000 [IU]/h | INTRAMUSCULAR | Status: DC
  Administered 2016-08-18 – 2016-08-20 (×3): 1200 [IU]/h via INTRAVENOUS
  Filled 2016-08-18 (×3): qty 250

## 2016-08-18 MED ORDER — HEPARIN BOLUS VIA INFUSION
4000.0000 [IU] | Freq: Once | INTRAVENOUS | Status: AC
  Administered 2016-08-18: 4000 [IU] via INTRAVENOUS
  Filled 2016-08-18: qty 4000

## 2016-08-18 NOTE — Progress Notes (Signed)
EEG completed, results pending. 

## 2016-08-18 NOTE — Progress Notes (Signed)
Pt is alert and oriented withdrawn on C-Pap, Vitals stable, Plan to have ECHO completed today

## 2016-08-18 NOTE — Procedures (Signed)
History: 61 year old female with recurrent right-sided numbness  Sedation: None  Technique: This is a 21 channel routine scalp EEG performed at the bedside with bipolar and monopolar montages arranged in accordance to the international 10/20 system of electrode placement. One channel was dedicated to EKG recording.    Background: The background consists of intermixed alpha and beta activities. There is a well defined posterior dominant rhythm of 9 Hz that attenuates with eye opening. Sleep is recorded with normal appearing structures.   Photic stimulation: Physiologic driving is not performed  EEG Abnormalities: None  Clinical Interpretation: This normal EEG is recorded in the waking and sleep state. There was no seizure or seizure predisposition recorded on this study. Please note that a normal EEG does not preclude the possibility of epilepsy.   Roland Rack, MD [facility] [phone]  If 7pm- 7am, please page neurology on call as listed in [place].

## 2016-08-18 NOTE — Evaluation (Signed)
Physical Therapy Evaluation Patient Details Name: Caitlyn Fuentes MRN: [id number] DOB: 1955-05-06 Today's Date: 08/18/2016   History of Present Illness  Pt is a 61 y/o female admitted secondary to a near syncopal episode at a physician visit with dizziness and R sided facial numbness and tingling. Pt's symptoms resolved but in ED was found to have an AKI. Further workup is pending. PMH including but not limited to CHF, lupus, a-fib, HTN and HLD.  Clinical Impression  Pt presented sitting EOB, awake and willing to participate in therapy session. Prior to admission, pt reported that she was independent with all functional mobility and ADLs. Pt lives with her husband in a two level house with bedroom and bathroom upstairs. Pt reported that she usually gets very fatigued when ascending and descending the stairs in her home as well as with ADLs. Pt ambulated within her room with min A for stability and two person HHA. Pt limited secondary to fatigue and dizziness. Pt also reporting her R sided facial numbness and tingling symptoms returning at the end of session. Pt's RN was notified. Pt would continue to benefit from skilled physical therapy services at this time while admitted and after d/c to address the below listed limitations in order to improve overall safety and independence with functional mobility.      Follow Up Recommendations Home health PT;Supervision/Assistance - 24 hour    Equipment Recommendations  Rolling walker with 5" wheels    Recommendations for Other Services       Precautions / Restrictions Precautions Precautions: Fall Restrictions Weight Bearing Restrictions: No      Mobility  Bed Mobility               General bed mobility comments: pt sitting EOB whent therapist entered room  Transfers Overall transfer level: Needs assistance Equipment used: 1 person hand held assist Transfers: Sit to/from Stand Sit to Stand: Min guard         General transfer  comment: increased time, min guard for safety  Ambulation/Gait Ambulation/Gait assistance: Min assist Ambulation Distance (Feet): 20 Feet Assistive device: 2 person hand held assist Gait Pattern/deviations: Step-through pattern;Decreased step length - right;Decreased step length - left;Decreased stride length Gait velocity: decreased Gait velocity interpretation: Below normal speed for age/gender General Gait Details: modest instability with ambulation. Pt requesting PT and husband's assist secondary to "feeling weak". No LOB but therapist encouraged pt to use RW for improved safety  Stairs            Wheelchair Mobility    Modified Rankin (Stroke Patients Only)       Balance Overall balance assessment: Needs assistance Sitting-balance support: Feet supported Sitting balance-Leahy Scale: Fair     Standing balance support: During functional activity;No upper extremity supported Standing balance-Leahy Scale: Fair                               Pertinent Vitals/Pain Pain Assessment: No/denies pain    Home Living Family/patient expects to be discharged to:: Private residence Living Arrangements: Spouse/significant other Available Help at Discharge: Family;Available 24 hours/day Type of Home: House Home Access: Stairs to enter Entrance Stairs-Rails: Psychiatric nurse of Steps: 3 Home Layout: Two level Home Equipment: None      Prior Function Level of Independence: Independent         Comments: pt reported that she was able to perform ADLs independently but would get fatigued very quickly  Hand Dominance        Extremity/Trunk Assessment   Upper Extremity Assessment Upper Extremity Assessment: Generalized weakness    Lower Extremity Assessment Lower Extremity Assessment: Generalized weakness       Communication   Communication: No difficulties  Cognition Arousal/Alertness: Awake/alert Behavior During Therapy: WFL  for tasks assessed/performed Overall Cognitive Status: Within Functional Limits for tasks assessed                                        General Comments      Exercises     Assessment/Plan    PT Assessment Patient needs continued PT services  PT Problem List Decreased strength;Decreased activity tolerance;Decreased balance;Decreased mobility;Decreased coordination;Decreased knowledge of use of DME;Decreased safety awareness;Decreased knowledge of precautions;Impaired sensation       PT Treatment Interventions DME instruction;Gait training;Stair training;Functional mobility training;Therapeutic activities;Therapeutic exercise;Balance training;Neuromuscular re-education;Patient/family education    PT Goals (Current goals can be found in the Care Plan section)  Acute Rehab PT Goals Patient Stated Goal: increase strength PT Goal Formulation: With patient/family Time For Goal Achievement: 09/01/16 Potential to Achieve Goals: Good    Frequency Min 3X/week   Barriers to discharge        Co-evaluation               End of Session Equipment Utilized During Treatment: Gait belt Activity Tolerance: Patient limited by fatigue Patient left: with call bell/phone within reach;with family/visitor present;Other (comment) (sitting EOB) Nurse Communication: Mobility status;Other (comment) (pt with R eyelid droop and reports of numb/tingle on R face) PT Visit Diagnosis: Unsteadiness on feet (R26.81);Other abnormalities of gait and mobility (R26.89);Muscle weakness (generalized) (M62.81)    Time: 3159-4585 PT Time Calculation (min) (ACUTE ONLY): 17 min   Charges:   PT Evaluation $PT Eval Moderate Complexity: 1 Procedure     PT G Codes:   PT G-Codes **NOT FOR INPATIENT CLASS** Functional Assessment Tool Used: AM-PAC 6 Clicks Basic Mobility;Clinical judgement Functional Limitation: Mobility: Walking and moving around Mobility: Walking and Moving Around Current  Status (F2924): At least 20 percent but less than 40 percent impaired, limited or restricted Mobility: Walking and Moving Around Goal Status [phone]): 0 percent impaired, limited or restricted    [facility], PT, DPT [place] 08/18/2016, 12:15 PM

## 2016-08-18 NOTE — Progress Notes (Signed)
CPAP set up and patient placed on auto titrate via nasal mask with 2L O2 bleed in. Switched to a small mask and pt more comfortable.

## 2016-08-18 NOTE — Progress Notes (Signed)
ANTICOAGULATION CONSULT NOTE - Follow Up Consult  Pharmacy Consult for Heparin Indication: atrial fibrillation  Allergies  Allergen Reactions  . Diovan [Valsartan] Other (See Comments)    Messes up kidneys  . Lisinopril Swelling    Facial swelling    Patient Measurements: Height: 5' 3.5" (161.3 cm) Weight: 253 lb 8 oz (115 kg) (Scale C) IBW/kg (Calculated) : 53.55   Labs:  Recent Labs  08/17/16 1250 08/17/16 1315 08/17/16 1658 08/17/16 1659 08/17/16 2043 08/18/16 0211 08/18/16 1052 08/18/16 2211  HGB 14.4 15.3*  --   --   --  14.1  --   --   HCT 42.3 45.0  --   --   --  41.3  --   --   PLT 338  --   --   --   --  323  --   --   LABPROT  --   --  13.9  --   --  13.8 14.0  --   INR  --   --  1.07  --   --  1.06 1.08  --   HEPARINUNFRC  --   --   --   --   --   --   --  0.70  CREATININE 2.60* 2.80*  --   --   --  2.69*  --   --   TROPONINI  --   --   --  <0.03 <0.03 <0.03  --   --     Estimated Creatinine Clearance: 27.5 mL/min (A) (by C-G formula based on SCr of 2.69 mg/dL (H)).  Assessment: 61 year old female continues on heparin bridge for Afib Initial heparin level therapeutic  Goal of Therapy:  Heparin level 0.3-0.7 units/ml Monitor platelets by anticoagulation protocol: Yes   Plan:  Continue heparin at 1200 units / hr Follow up AM labs  Thank you Anette Guarneri, PharmD [phone]  08/18/2016,10:46 PM

## 2016-08-18 NOTE — Progress Notes (Signed)
Nutrition Brief Note  Patient identified on the Malnutrition Screening Tool (MST) Report  Wt Readings from Last 15 Encounters:  08/18/16 253 lb 8 oz (115 kg)  08/13/16 264 lb 9.6 oz (120 kg)  08/10/16 268 lb (121.6 kg)  07/23/16 268 lb (121.6 kg)  04/12/16 260 lb (117.9 kg)  02/28/16 264 lb (119.7 kg)  02/07/16 259 lb (117.5 kg)  12/23/15 261 lb (118.4 kg)  11/22/15 263 lb (119.3 kg)  11/04/15 263 lb 3.2 oz (119.4 kg)  10/17/15 262 lb (118.8 kg)  09/22/15 268 lb (121.6 kg)  08/01/15 263 lb (119.3 kg)  05/25/15 267 lb 1.6 oz (121.2 kg)  04/29/15 264 lb (119.7 kg)   Body mass index is 44.2 kg/m. Patient meets criteria for Morbidly obese based on current BMI.   Pt had reported on MST that she had lost weight without trying and had been eating poorly due to decreased appetite.   Per pt, she has recently received multiple diuretics including IV lasix during ED visit 1 week ago and then prn Metolazone a few days after that. She says her fluid loss is why she has lost weight vs poor PO intake.   She does believe her appetite "had decreased with the fluid", but may have been due to her HF exacerbation. She says her appetite now has returned. SHe is seen up in bed eating her lunch. She asks for fruit on her trays. She wants to know when she will be able to leave.  Current diet order is Regular. Will order fruit for her. She has Ensure added that she can drink if shed like.   No nutrition interventions warranted at this time. If nutrition issues arise, please consult RD.   Burtis Junes RD, LDN, CNSC Clinical Nutrition Pager: [phone] 08/18/2016 1:50 PM

## 2016-08-18 NOTE — Progress Notes (Signed)
Patient complaining of right side face numbness.  She states it feels like she had novocain.  No facial droop or asymmetry noted, equal hand grips.  Dr. Candiss Norse paged and made aware.  New orders received.  Will continue to monitor.

## 2016-08-18 NOTE — Progress Notes (Signed)
Patient had loss of IV access this morning and went to MRI.  IV heparin started at this time.  Doroteo Bradford, pharmacist aware to adjust lab draws.

## 2016-08-18 NOTE — Progress Notes (Signed)
[facility] for warfarin Indication: atrial fibrillation  Allergies  Allergen Reactions  . Diovan [Valsartan] Other (See Comments)    Messes up kidneys  . Lisinopril Swelling    Facial swelling    Patient Measurements: Height: 5' 3.5" (161.3 cm) Weight: 253 lb 8 oz (115 kg) (Scale C) IBW/kg (Calculated) : 53.55  Heparin dosing weight: 81.33 kg  Vital Signs: Temp: 97.8 F (36.6 C) (04/28 0829) Temp Source: Oral (04/28 0829) BP: 124/70 (04/28 0829) Pulse Rate: 82 (04/28 0829)  Labs:  Recent Labs  08/17/16 1250 08/17/16 1315 08/17/16 1658 08/17/16 1659 08/17/16 2043 08/18/16 0211  HGB 14.4 15.3*  --   --   --  14.1  HCT 42.3 45.0  --   --   --  41.3  PLT 338  --   --   --   --  323  LABPROT  --   --  13.9  --   --  13.8  INR  --   --  1.07  --   --  1.06  CREATININE 2.60* 2.80*  --   --   --  2.69*  TROPONINI  --   --   --  <0.03 <0.03 <0.03    Estimated Creatinine Clearance: 27.5 mL/min (A) (by C-G formula based on SCr of 2.69 mg/dL (H)).   Assessment: 61 yo female admitted on 08/17/2016 with near syncopal event. Pharmacy consulted to dose heparin bridge to therapeutic warfarin. PMHx of atrial fibrillation on warfarin PTA. Historically her compliance with warfarin has been poor and cardiology planned/intended for her to transition to apixaban 5 mg BID on 07/23/16 (gave her samples in office). However, pt reports that she has not yet begun taking apixaban as she wanted to finish her supply of warfarin first. Admit INR 1.07, last dose unknown.   INR today remains SUBtherapeutic at 1.06. CBC remains wnl and stable with no reported significant s/s bleeding.   PTA Coumadin Dose: 5 mg daily except 7.5 mg on Fri per Coumadin clinic, pt unsure. Last Coumadin clinic note of 4/2 noted that she had not taken any Coumadin x 1 week; note on 02/13/16 had pt taking 5 daily except 7.5 on Fri and INR 2.6  Goal of Therapy:  HL 0.3-0.7 INR  2-3 Monitor platelets by anticoagulation protocol: Yes   Plan:  - Heparin bolus 4000 units x 1 followed by 1200 units/hr - HL in 8 hours - Warfarin 7.5 mg x 1 this evening   - Daily HL, INR, CBC - ? If AKI resolves transition to apixaban while inpatient   Massimo Hartland K. Velva Harman, PharmD, BCPS, CPP Clinical Pharmacist Pager: [phone] Phone: [phone] 08/18/2016 10:40 AM

## 2016-08-18 NOTE — Consult Note (Signed)
Neurology Consultation Reason for Consult: Right-sided numbness and tingling Referring Physician: Ronnie Derby  CC: Right-sided numbness, weakness and tingling  History is obtained from: Patient, husband  HPI: Caitlyn Fuentes is a 61 y.o. female with a history of recurrent episodes of right-sided tingling, numbness, weakness. She saw Dr. Carles Collet 2017 for these episodes. Her husband states that it is often present in the morning, and clears up over the course of the day. Of note, she often has headaches in the morning as well.  Prior to coming into the hospital, she had a near-syncopal episode and had right-sided numbness following this presyncopal episode. She has had 2 more episodes since that time both transient lasting only a few minutes.  She states that she gets these at least multiple times per month.  She has had a workup in the past including CT angiogram of the head, though I do not have these results.   ROS: A 14 point ROS was performed and is negative except as noted in the HPI.   Past Medical History:  Diagnosis Date  . A-fib ([place])   . Cardiomyopathy   . Hyperlipidemia   . Hypertension   . Lupus   . Pulmonary hypertension ([place]) 05/10/2011   Echo, EF-40-45  . Renal disorder   . Sleep apnea 2008   CPAP, pt does not know settings  . Thyroid disease      Family History  Problem Relation Age of Onset  . Heart disease Mother   . Heart disease Father   . Lung disease Sister   . Heart disease Brother   . Hypertension Sister      Social History:  reports that she quit smoking about 7 years ago. She has never used smokeless tobacco. She reports that she drinks alcohol. She reports that she does not use drugs.   Exam: Current vital signs: BP (!) 121/54 (BP Location: Right Arm)   Pulse 79   Temp 97.6 F (36.4 C) (Oral)   Resp 18   Ht 5' 3.5" (1.613 m)   Wt 115 kg (253 lb 8 oz) Comment: Scale C  SpO2 100%   BMI 44.20 kg/m  Vital signs in last 24 hours: Temp:   [97.4 F (36.3 C)-98 F (36.7 C)] 97.6 F (36.4 C) (04/28 1219) Pulse Rate:  [74-89] 79 (04/28 1219) Resp:  [16-23] 18 (04/28 1219) BP: (121-140)/(54-80) 121/54 (04/28 1219) SpO2:  [93 %-100 %] 100 % (04/28 1219) FiO2 (%):  [1 %] 1 % (04/27 1636) Weight:  [115 kg (253 lb 8 oz)-115.3 kg (254 lb 1.6 oz)] 115 kg (253 lb 8 oz) (04/28 0452)   Physical Exam  Constitutional: Appears well-developed and well-nourished.  Psych: Affect appropriate to situation Eyes: No scleral injection HENT: No OP obstrucion Head: Normocephalic.  Cardiovascular: Normal rate and regular rhythm.  Respiratory: Effort normal and breath sounds normal to anterior ascultation GI: Soft.  No distension. There is no tenderness.  Skin: WDI  Neuro: Mental Status: Patient is awake, alert, oriented to person, place, month, year, and situation. Patient is able to give a clear and coherent history. No signs of aphasia or neglect Cranial Nerves: II: Visual Fields are full. Pupils are equal, round, and reactive to light.   III,IV, VI: EOMI without ptosis or diploplia.  V: Facial sensation is symmetric to temperature VII: Facial movement is symmetric.  VIII: hearing is intact to voice X: Uvula elevates symmetrically XI: Shoulder shrug is symmetric. XII: tongue is midline without atrophy or fasciculations.  Motor: Tone is normal. Bulk is normal. 5/5 strength was present in all four extremities.  Sensory: Sensation is symmetric to light touch and temperature in the arms and legs. Deep Tendon Reflexes: 2+ and symmetric in the biceps and patellae.  Plantars: Toes are downgoing bilaterally.  Cerebellar: FNF and HKS are intact bilaterally   I have reviewed labs in epic and the results pertinent to this consultation are: Lipid panel LDL 51  I have reviewed the images obtained: CT head-unremarkable  Impression: 61 year old female with recurrent episodes of right-sided numbness and tingling and facial droop. The  presence of positive symptoms (tingling) does argue against ischemic cause, but I still think this should be investigated. If that was due to cerebrovascular disease, I would expect stenosis and this could be evaluated with MRA head and carotid Dopplers.  With recurrent stereotype spells, I do think that an EEG is indicated but I have low suspicion for seizure.  Finally, with a long history of migraine I think that migraine aura would be a possibility given the recurrent nature without lasting deficits and positive symptoms.  Recommendations: 1) MRI brain, MRA head and carotid Dopplers 2) EEG  Roland Rack, MD [facility] [phone]  If 7pm- 7am, please page neurology on call as listed in [place].

## 2016-08-18 NOTE — Progress Notes (Signed)
PROGRESS NOTE                                                                                                                                                                                                             Patient Demographics:    Caitlyn Fuentes, is a 61 y.o. female, DOB - Aug 20, 1955, WPY:[id number]  Admit date - 08/17/2016   Admitting Physician Elwin Mocha, MD  Outpatient Primary MD for the patient is [name], MD  LOS - 0  Chief Complaint  Patient presents with  . Near Syncope       Brief Narrative   Caitlyn Fuentes is a 61 y.o. female with medical history significant for obesity, NICM with EF 45% and associated diastolic dysfunction, PAF on eliquis, sleep apnea, and HLD. Patient went to a physician visit today and had a near syncopal episode, she was sent to the ER where she also complained of some right-sided pending numbness this happened on the day of admission and then again morning of 08/18/2016, she was also found to have some dehydration with acute renal failure due to recently increased diuretic dose.   Subjective:    Jerald Dory today has, No headache, No chest pain, No abdominal pain - No Nausea, No new weakness tingling or numbness, No Cough - SOB. She had some intermittent right-sided tingling and numbness  this morning.   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 Echo less than one year ago shows EF of 45%. Currently dehydrated from overdiuresis, gentle normal saline and monitor. No shortness of breath or rales. Per patient her edema has gone down from 3+ to trace. Resume lowest dose Coreg. Echo pending.  2. ARF. Due to overdiuresis and being on ACE inhibitor. Hold offending medications, gentle hydration and monitor.  3. Syncope with right-sided paresthesias. Unclear etiology, neuro onboard, CT head  unremarkable, undergoing MRI and EEG. Get echo for syncopal episode. We will also check A1c, LDL < 70 on home dose statin, PT evaluation. No weakness on exam.  4. Dyslipidemia continue statin and omega-3 fatty acids. Stable LDL.  5. Morbid obesity with obstructive sleep apnea. CPAP at night, follow with PCP for weight loss.  6. Paroxysmal atrial fibrillation [place] vasc 2 score of at least 3. Low-dose Coreg started,  pharmacy requested to bridge with heparin in the light of subtherapeutic INR and ongoing neurological symptoms.   Lab Results  Component Value Date   CHOL 126 08/18/2016   HDL 39 (L) 08/18/2016   LDLCALC 51 08/18/2016   TRIG 178 (H) 08/18/2016   CHOLHDL 3.2 08/18/2016    Lab Results  Component Value Date   HGBA1C 5.3 08/01/2015     Diet : Diet Heart Room service appropriate? Yes; Fluid consistency: Thin    Family Communication  :  Son  Code Status :  Full  Disposition Plan  :  TBD  Consults  :  Neuro  Procedures  :    CT Head  - -ve   MRI  TTE  EEG  DVT Prophylaxis  :  Heparin/Coumadin  Lab Results  Component Value Date   INR 1.08 08/18/2016   INR 1.06 08/18/2016   INR 1.07 08/17/2016     Lab Results  Component Value Date   PLT 323 08/18/2016    Inpatient Medications  Scheduled Meds: . atorvastatin  40 mg Oral q1800  . estradiol  2 mg Oral Daily  . feeding supplement (ENSURE ENLIVE)  237 mL Oral BID BM  . heparin  4,000 Units Intravenous Once  . levothyroxine  25 mcg Oral QAC breakfast  . omega-3 acid ethyl esters  1 g Oral Daily  . sodium chloride flush  3 mL Intravenous Q12H  . warfarin  7.5 mg Oral ONCE-1800  . Warfarin - Pharmacist Dosing Inpatient   Does not apply q1800   Continuous Infusions: . sodium chloride 75 mL/hr at 08/18/16 0647  . heparin     PRN Meds:.acetaminophen **OR** [DISCONTINUED] acetaminophen, [DISCONTINUED] ondansetron **OR** ondansetron (ZOFRAN) IV, zolpidem  Antibiotics  :    Anti-infectives    None           Objective:   Vitals:   08/18/16 0123 08/18/16 0452 08/18/16 0829 08/18/16 1219  BP: 133/80 127/74 124/70 (!) 121/54  Pulse: 87 84 82 79  Resp: 17 16 18 18   Temp: 97.4 F (36.3 C) 97.5 F (36.4 C) 97.8 F (36.6 C) 97.6 F (36.4 C)  TempSrc: Oral Oral Oral Oral  SpO2: 100% 100% 100% 100%  Weight:  115 kg (253 lb 8 oz)    Height:        Wt Readings from Last 3 Encounters:  08/18/16 115 kg (253 lb 8 oz)  08/13/16 120 kg (264 lb 9.6 oz)  08/10/16 121.6 kg (268 lb)     Intake/Output Summary (Last 24 hours) at 08/18/16 1346 Last data filed at 08/18/16 0525  Gross per 24 hour  Intake              410 ml  Output             1000 ml  Net             -590 ml     Physical Exam  Awake Alert, Oriented X 3, No new F.N deficits, Normal affect [place].AT,PERRAL Supple Neck,No JVD, No cervical lymphadenopathy appriciated.  Symmetrical Chest wall movement, Good air movement bilaterally, CTAB RRR,No Gallops,Rubs or new Murmurs, No Parasternal Heave +ve B.Sounds, Abd Soft, No tenderness, No organomegaly appriciated, No rebound - guarding or rigidity. No Cyanosis, Clubbing , trace edema, No new Rash or bruise       Data Review:    CBC  Recent Labs Lab 08/17/16 1250 08/17/16 1315 08/18/16 0211  WBC 3.1*  --  3.8*  HGB 14.4 15.3* 14.1  HCT 42.3 45.0 41.3  PLT 338  --  323  MCV 89.6  --  90.4  MCH 30.5  --  30.9  MCHC 34.0  --  34.1  RDW 13.0  --  13.2  LYMPHSABS 0.9  --   --   MONOABS 0.6  --   --   EOSABS 0.0  --   --   BASOSABS 0.0  --   --     Chemistries   Recent Labs Lab 08/17/16 1250 08/17/16 1315 08/18/16 0211  NA 131* 131* 134*  K 3.4* 3.3* 3.8  CL 89* 93* 92*  CO2 26  --  27  GLUCOSE 125* 128* 113*  BUN 71* 62* 69*  CREATININE 2.60* 2.80* 2.69*  CALCIUM 9.4  --  9.5  MG 2.3  --   --   AST 25  --  25  ALT 19  --  16  ALKPHOS 103  --  95  BILITOT 0.8  --  0.7    ------------------------------------------------------------------------------------------------------------------  Recent Labs  08/18/16 0827  CHOL 126  HDL 39*  LDLCALC 51  TRIG 178*  CHOLHDL 3.2    Lab Results  Component Value Date   HGBA1C 5.3 08/01/2015   ------------------------------------------------------------------------------------------------------------------ No results for input(s): TSH, T4TOTAL, T3FREE, THYROIDAB in the last 72 hours.  Invalid input(s): FREET3 ------------------------------------------------------------------------------------------------------------------ No results for input(s): VITAMINB12, FOLATE, FERRITIN, TIBC, IRON, RETICCTPCT in the last 72 hours.  Coagulation profile  Recent Labs Lab 08/17/16 1658 08/18/16 0211 08/18/16 1052  INR 1.07 1.06 1.08    No results for input(s): DDIMER in the last 72 hours.  Cardiac Enzymes  Recent Labs Lab 08/17/16 1659 08/17/16 2043 08/18/16 0211  TROPONINI <0.03 <0.03 <0.03   ------------------------------------------------------------------------------------------------------------------    Component Value Date/Time   BNP 21.0 08/17/2016 1250   BNP 165.4 (H) 05/25/2015 1059    Micro Results No results found for this or any previous visit (from the past 240 hour(s)).  Radiology Reports Dg Chest 2 View  Result Date: 08/10/2016 CLINICAL DATA:  Shortness of breath over the last 2 days. EXAM: CHEST  2 VIEW COMPARISON:  08/16/2011 FINDINGS: There is cardiomegaly. There is venous hypertension with early interstitial edema. No consolidation or collapse. No visible effusion. No significant bone finding. IMPRESSION: Cardiomegaly, pulmonary venous hypertension and early interstitial edema. Electronically Signed   By: Nelson Chimes M.D.   On: 08/10/2016 22:43   Ct Head Wo Contrast  Result Date: 08/17/2016 CLINICAL DATA:  CT head with nausea and syncope EXAM: CT HEAD WITHOUT CONTRAST TECHNIQUE:  Contiguous axial images were obtained from the base of the skull through the vertex without intravenous contrast. COMPARISON:  Brain MRI 06/01/1998 FINDINGS: Brain: No acute intracranial hemorrhage. No focal mass lesion. No CT evidence of acute infarction. No midline shift or mass effect. No hydrocephalus. Basilar cisterns are patent. Vascular: No hyperdense vessel or unexpected calcification. Skull: Normal. Negative for fracture or focal lesion. Sinuses/Orbits: Paranasal sinuses and mastoid air cells are clear. Orbits are clear. Other: None. IMPRESSION: 1. No acute intracranial findings. 2. No change from comparison exam. Electronically Signed   By: Suzy Bouchard M.D.   On: 08/17/2016 16:11   Dg Chest Port 1 View  Result Date: 08/17/2016 CLINICAL DATA:  Sharp chest pain EXAM: PORTABLE CHEST 1 VIEW COMPARISON:  08/10/2016 FINDINGS: Mild cardiomegaly. Lungs are clear. No effusions or edema. No acute bony abnormality. IMPRESSION: Cardiomegaly.  No active disease. Electronically Signed   By: Lennette Bihari  [place] M.D.   On: 08/17/2016 12:48    Time Spent in minutes  30   Lala Lund M.D on 08/18/2016 at 1:46 PM  Between 7am to 7pm - Pager - [phone] ( page via [email or web address], text pages only, please mention full 10 digit call back number). After 7pm go to [email or web address] - password [email or web address]

## 2016-08-19 ENCOUNTER — Observation Stay (HOSPITAL_COMMUNITY): Payer: 59

## 2016-08-19 ENCOUNTER — Observation Stay (HOSPITAL_BASED_OUTPATIENT_CLINIC_OR_DEPARTMENT_OTHER): Payer: 59

## 2016-08-19 DIAGNOSIS — I639 Cerebral infarction, unspecified: Secondary | ICD-10-CM | POA: Diagnosis not present

## 2016-08-19 DIAGNOSIS — G4733 Obstructive sleep apnea (adult) (pediatric): Secondary | ICD-10-CM | POA: Diagnosis present

## 2016-08-19 DIAGNOSIS — R55 Syncope and collapse: Secondary | ICD-10-CM

## 2016-08-19 DIAGNOSIS — Z79890 Hormone replacement therapy (postmenopausal): Secondary | ICD-10-CM | POA: Diagnosis not present

## 2016-08-19 DIAGNOSIS — E860 Dehydration: Secondary | ICD-10-CM | POA: Diagnosis present

## 2016-08-19 DIAGNOSIS — Z6841 Body Mass Index (BMI) 40.0 and over, adult: Secondary | ICD-10-CM | POA: Diagnosis not present

## 2016-08-19 DIAGNOSIS — Z8249 Family history of ischemic heart disease and other diseases of the circulatory system: Secondary | ICD-10-CM | POA: Diagnosis not present

## 2016-08-19 DIAGNOSIS — I480 Paroxysmal atrial fibrillation: Secondary | ICD-10-CM | POA: Diagnosis present

## 2016-08-19 DIAGNOSIS — I429 Cardiomyopathy, unspecified: Secondary | ICD-10-CM | POA: Diagnosis present

## 2016-08-19 DIAGNOSIS — Z7901 Long term (current) use of anticoagulants: Secondary | ICD-10-CM | POA: Diagnosis not present

## 2016-08-19 DIAGNOSIS — T502X5A Adverse effect of carbonic-anhydrase inhibitors, benzothiadiazides and other diuretics, initial encounter: Secondary | ICD-10-CM | POA: Diagnosis present

## 2016-08-19 DIAGNOSIS — Z888 Allergy status to other drugs, medicaments and biological substances status: Secondary | ICD-10-CM | POA: Diagnosis not present

## 2016-08-19 DIAGNOSIS — Z90710 Acquired absence of both cervix and uterus: Secondary | ICD-10-CM | POA: Diagnosis not present

## 2016-08-19 DIAGNOSIS — Z79899 Other long term (current) drug therapy: Secondary | ICD-10-CM | POA: Diagnosis not present

## 2016-08-19 DIAGNOSIS — I5042 Chronic combined systolic (congestive) and diastolic (congestive) heart failure: Secondary | ICD-10-CM | POA: Diagnosis present

## 2016-08-19 DIAGNOSIS — N182 Chronic kidney disease, stage 2 (mild): Secondary | ICD-10-CM | POA: Diagnosis present

## 2016-08-19 DIAGNOSIS — N179 Acute kidney failure, unspecified: Secondary | ICD-10-CM

## 2016-08-19 DIAGNOSIS — E785 Hyperlipidemia, unspecified: Secondary | ICD-10-CM | POA: Diagnosis present

## 2016-08-19 DIAGNOSIS — Z87891 Personal history of nicotine dependence: Secondary | ICD-10-CM | POA: Diagnosis not present

## 2016-08-19 DIAGNOSIS — R791 Abnormal coagulation profile: Secondary | ICD-10-CM | POA: Diagnosis present

## 2016-08-19 DIAGNOSIS — E871 Hypo-osmolality and hyponatremia: Secondary | ICD-10-CM | POA: Diagnosis present

## 2016-08-19 DIAGNOSIS — Z9119 Patient's noncompliance with other medical treatment and regimen: Secondary | ICD-10-CM | POA: Diagnosis not present

## 2016-08-19 DIAGNOSIS — E876 Hypokalemia: Secondary | ICD-10-CM | POA: Diagnosis present

## 2016-08-19 DIAGNOSIS — I130 Hypertensive heart and chronic kidney disease with heart failure and stage 1 through stage 4 chronic kidney disease, or unspecified chronic kidney disease: Secondary | ICD-10-CM | POA: Diagnosis present

## 2016-08-19 DIAGNOSIS — R29810 Facial weakness: Secondary | ICD-10-CM | POA: Diagnosis present

## 2016-08-19 LAB — BASIC METABOLIC PANEL
Anion gap: 10 (ref 5–15)
BUN: 61 mg/dL — AB (ref 6–20)
CALCIUM: 8.9 mg/dL (ref 8.9–10.3)
CO2: 27 mmol/L (ref 22–32)
Chloride: 98 mmol/L — ABNORMAL LOW (ref 101–111)
Creatinine, Ser: 2.01 mg/dL — ABNORMAL HIGH (ref 0.44–1.00)
GFR calc Af Amer: 30 mL/min — ABNORMAL LOW (ref >60)
GFR, EST NON AFRICAN AMERICAN: 26 mL/min — AB (ref >60)
Glucose, Bld: 102 mg/dL — ABNORMAL HIGH (ref 65–99)
Potassium: 3.3 mmol/L — ABNORMAL LOW (ref 3.5–5.1)
Sodium: 135 mmol/L (ref 135–145)

## 2016-08-19 LAB — CBC
HCT: 38.5 % (ref 36.0–46.0)
Hemoglobin: 12.6 g/dL (ref 12.0–15.0)
MCH: 29.9 pg (ref 26.0–34.0)
MCHC: 32.7 g/dL (ref 30.0–36.0)
MCV: 91.2 fL (ref 78.0–100.0)
Platelets: 307 10*3/uL (ref 150–400)
RBC: 4.22 MIL/uL (ref 3.87–5.11)
RDW: 13.3 % (ref 11.5–15.5)
WBC: 3 10*3/uL — ABNORMAL LOW (ref 4.0–10.5)

## 2016-08-19 LAB — GLUCOSE, CAPILLARY: Glucose-Capillary: 117 mg/dL — ABNORMAL HIGH (ref 65–99)

## 2016-08-19 LAB — ECHOCARDIOGRAM COMPLETE
CHL CUP DOP CALC LVOT VTI: 15.6 cm
CHL CUP STROKE VOLUME: 38 mL
E/e' ratio: 11.44
FS: 24 % — AB (ref 28–44)
HEIGHTINCHES: 63.5 in
IV/PV OW: 0.9
LA diam index: 1.81 cm/m2
LA vol A4C: 70.3 ml
LASIZE: 39 mm
LAVOL: 68.1 mL
LAVOLIN: 31.5 mL/m2
LDCA: 4.52 cm2
LEFT ATRIUM END SYS DIAM: 39 mm
LV E/e' medial: 11.44
LV E/e'average: 11.44
LV PW d: 10 mm — AB (ref 0.6–1.1)
LV SIMPSON'S DISK: 38
LV sys vol index: 29 mL/m2
LV sys vol: 62 mL — AB (ref 14–42)
LVDIAVOL: 100 mL (ref 46–106)
LVDIAVOLIN: 46 mL/m2
LVELAT: 6.53 cm/s
LVOT SV: 71 mL
LVOT diameter: 24 mm
LVOT peak grad rest: 3 mmHg
LVOT peak vel: 92.3 cm/s
Lateral S' vel: 13.1 cm/s
MV pk A vel: 86.9 m/s
MV pk E vel: 74.7 m/s
MVPG: 2 mmHg
TAPSE: 22.8 mm
TDI e' lateral: 6.53
TDI e' medial: 8.27
WEIGHTICAEL: 4097.6 [oz_av]

## 2016-08-19 LAB — HEMOGLOBIN A1C
HEMOGLOBIN A1C: 4.8 % (ref 4.8–5.6)
MEAN PLASMA GLUCOSE: 91 mg/dL

## 2016-08-19 LAB — PROTIME-INR
INR: 1.2
Prothrombin Time: 15.2 seconds (ref 11.4–15.2)

## 2016-08-19 LAB — HEPARIN LEVEL (UNFRACTIONATED): Heparin Unfractionated: 0.65 IU/mL (ref 0.30–0.70)

## 2016-08-19 MED ORDER — POTASSIUM CHLORIDE CRYS ER 20 MEQ PO TBCR
40.0000 meq | EXTENDED_RELEASE_TABLET | Freq: Four times a day (QID) | ORAL | Status: AC
  Administered 2016-08-19 (×2): 40 meq via ORAL
  Filled 2016-08-19 (×2): qty 2

## 2016-08-19 MED ORDER — SODIUM CHLORIDE 0.9 % IV SOLN: INTRAVENOUS | Status: AC

## 2016-08-19 MED ORDER — WARFARIN SODIUM 7.5 MG PO TABS
7.5000 mg | ORAL_TABLET | Freq: Once | ORAL | Status: AC
  Administered 2016-08-19: 7.5 mg via ORAL
  Filled 2016-08-19: qty 1

## 2016-08-19 MED ORDER — SODIUM CHLORIDE 0.9 % IV SOLN
INTRAVENOUS | Status: DC
  Administered 2016-08-19: 08:00:00 via INTRAVENOUS

## 2016-08-19 NOTE — Progress Notes (Signed)
PROGRESS NOTE                                                                                                                                                                                                             Patient Demographics:    Caitlyn Fuentes, is a 61 y.o. female, DOB - March 11, 1956, NLZ:[id number]  Admit date - 08/17/2016   Admitting Physician Elwin Mocha, MD  Outpatient Primary MD for the patient is [name], MD  LOS - 0  Chief Complaint  Patient presents with  . Near Syncope       Brief Narrative   Caitlyn Fuentes is a 61 y.o. female with medical history significant for obesity, NICM with EF 45% and associated diastolic dysfunction, PAF on eliquis, sleep apnea, and HLD. Patient went to a physician visit today and had a near syncopal episode, she was sent to the ER where she also complained of some right-sided pending numbness this happened on the day of admission and then again morning of 08/18/2016, she was also found to have some dehydration with acute renal failure due to recently increased diuretic dose.   Subjective:   Patient sitting in chair, denies any headache, no further paresthesias except minimal dealing on the right side of her face, no chest abdominal pain, no shortness of breath or focal weakness.   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 Echo less than one year ago shows EF of 45%. Currently dehydrated from overdiuresis, gentle normal saline and monitor. Symptoms have currently resolved, No shortness of breath or rales. Per patient her edema has gone down from 3+ to trace. Resumed lowest dose Coreg. Echo pending.  2. ARF. Due to over diuresis, improving with IV fluids, continue to avoid nephrotoxins.  3. Syncope with right-sided paresthesias. Symptoms now appear to be intermittent and somewhat chronic for which  she has seen outpatient neurology as well, seen by neurology, Unclear etiology, CT head, MRI brain and EEG unremarkable, pending A1c, echocardiogram and carotid duplex.  LDL < 70 on home dose statin, PT evaluation.   4. Dyslipidemia continue statin and omega-3 fatty acids. Stable LDL.  5. Morbid obesity with obstructive sleep apnea. CPAP at night, follow with PCP for weight loss.  6. Paroxysmal atrial fibrillation [place] vasc 2  score of at least 3. Low-dose Coreg started, pharmacy requested to bridge with heparin in the light of subtherapeutic INR and ongoing neurological symptoms. If renal function better will switch to Lovenox with Coumadin overlap in the morning and discharge.   Lab Results  Component Value Date   CHOL 126 08/18/2016   HDL 39 (L) 08/18/2016   LDLCALC 51 08/18/2016   TRIG 178 (H) 08/18/2016   CHOLHDL 3.2 08/18/2016    Lab Results  Component Value Date   HGBA1C 5.3 08/01/2015     Diet : Diet Heart Room service appropriate? Yes; Fluid consistency: Thin    Family Communication  :  Son  Code Status :  Full  Disposition Plan  :  Home in am  Consults  :  Neuro  Procedures  :    CT Head  - -ve   MRI - No CVA  EEG - non acute  TTE -   DVT Prophylaxis  :  Heparin/Coumadin  Lab Results  Component Value Date   INR 1.20 08/19/2016   INR 1.08 08/18/2016   INR 1.06 08/18/2016     Lab Results  Component Value Date   PLT 307 08/19/2016    Inpatient Medications  Scheduled Meds: . atorvastatin  40 mg Oral q1800  . carvedilol  3.125 mg Oral BID  . estradiol  2 mg Oral Daily  . feeding supplement (ENSURE ENLIVE)  237 mL Oral BID BM  . levothyroxine  25 mcg Oral QAC breakfast  . omega-3 acid ethyl esters  1 g Oral Daily  . potassium chloride  40 mEq Oral Q6H  . sodium chloride flush  3 mL Intravenous Q12H  . warfarin  7.5 mg Oral ONCE-1800  . Warfarin - Pharmacist Dosing Inpatient   Does not apply q1800   Continuous Infusions: . sodium chloride      . heparin 1,200 Units/hr (08/19/16 0649)   PRN Meds:.acetaminophen **OR** [DISCONTINUED] acetaminophen, [DISCONTINUED] ondansetron **OR** ondansetron (ZOFRAN) IV, zolpidem  Antibiotics  :    Anti-infectives    None         Objective:   Vitals:   08/18/16 2057 08/19/16 0022 08/19/16 0510 08/19/16 0900  BP: (!) 148/79 140/76  123/69  Pulse: 96 75    Resp: 18 18 18 18   Temp: 98.6 F (37 C) 98.1 F (36.7 C) 97.8 F (36.6 C)   TempSrc: Oral Oral Oral   SpO2: 98% 96% 100% 96%  Weight:   116.2 kg (256 lb 1.6 oz)   Height:        Wt Readings from Last 3 Encounters:  08/19/16 116.2 kg (256 lb 1.6 oz)  08/13/16 120 kg (264 lb 9.6 oz)  08/10/16 121.6 kg (268 lb)     Intake/Output Summary (Last 24 hours) at 08/19/16 0958 Last data filed at 08/19/16 0836  Gross per 24 hour  Intake          2788.25 ml  Output             2000 ml  Net           788.25 ml     Physical Exam  Awake Alert, Oriented X 3, No new F.N deficits, Normal affect [place].AT,PERRAL Supple Neck,No JVD, No cervical lymphadenopathy appriciated.  Symmetrical Chest wall movement, Good air movement bilaterally, CTAB RRR,No Gallops,Rubs or new Murmurs, No Parasternal Heave +ve B.Sounds, Abd Soft, No tenderness, No organomegaly appriciated, No rebound - guarding or rigidity. No Cyanosis, Clubbing, trace leg edema, No  new Rash or bruise     Data Review:    CBC  Recent Labs Lab 08/17/16 1250 08/17/16 1315 08/18/16 0211 08/19/16 0317  WBC 3.1*  --  3.8* 3.0*  HGB 14.4 15.3* 14.1 12.6  HCT 42.3 45.0 41.3 38.5  PLT 338  --  323 307  MCV 89.6  --  90.4 91.2  MCH 30.5  --  30.9 29.9  MCHC 34.0  --  34.1 32.7  RDW 13.0  --  13.2 13.3  LYMPHSABS 0.9  --   --   --   MONOABS 0.6  --   --   --   EOSABS 0.0  --   --   --   BASOSABS 0.0  --   --   --     Chemistries   Recent Labs Lab 08/17/16 1250 08/17/16 1315 08/18/16 0211 08/19/16 0317  NA 131* 131* 134* 135  K 3.4* 3.3* 3.8 3.3*  CL 89* 93*  92* 98*  CO2 26  --  27 27  GLUCOSE 125* 128* 113* 102*  BUN 71* 62* 69* 61*  CREATININE 2.60* 2.80* 2.69* 2.01*  CALCIUM 9.4  --  9.5 8.9  MG 2.3  --   --   --   AST 25  --  25  --   ALT 19  --  16  --   ALKPHOS 103  --  95  --   BILITOT 0.8  --  0.7  --    ------------------------------------------------------------------------------------------------------------------  Recent Labs  08/18/16 0827  CHOL 126  HDL 39*  LDLCALC 51  TRIG 178*  CHOLHDL 3.2    Lab Results  Component Value Date   HGBA1C 5.3 08/01/2015   ------------------------------------------------------------------------------------------------------------------ No results for input(s): TSH, T4TOTAL, T3FREE, THYROIDAB in the last 72 hours.  Invalid input(s): FREET3 ------------------------------------------------------------------------------------------------------------------ No results for input(s): VITAMINB12, FOLATE, FERRITIN, TIBC, IRON, RETICCTPCT in the last 72 hours.  Coagulation profile  Recent Labs Lab 08/17/16 1658 08/18/16 0211 08/18/16 1052 08/19/16 0317  INR 1.07 1.06 1.08 1.20    No results for input(s): DDIMER in the last 72 hours.  Cardiac Enzymes  Recent Labs Lab 08/17/16 1659 08/17/16 2043 08/18/16 0211  TROPONINI <0.03 <0.03 <0.03   ------------------------------------------------------------------------------------------------------------------    Component Value Date/Time   BNP 21.0 08/17/2016 1250   BNP 165.4 (H) 05/25/2015 1059    Micro Results No results found for this or any previous visit (from the past 240 hour(s)).  Radiology Reports Dg Chest 2 View  Result Date: 08/10/2016 CLINICAL DATA:  Shortness of breath over the last 2 days. EXAM: CHEST  2 VIEW COMPARISON:  08/16/2011 FINDINGS: There is cardiomegaly. There is venous hypertension with early interstitial edema. No consolidation or collapse. No visible effusion. No significant bone finding.  IMPRESSION: Cardiomegaly, pulmonary venous hypertension and early interstitial edema. Electronically Signed   By: Nelson Chimes M.D.   On: 08/10/2016 22:43   Ct Head Wo Contrast  Result Date: 08/17/2016 CLINICAL DATA:  CT head with nausea and syncope EXAM: CT HEAD WITHOUT CONTRAST TECHNIQUE: Contiguous axial images were obtained from the base of the skull through the vertex without intravenous contrast. COMPARISON:  Brain MRI 06/01/1998 FINDINGS: Brain: No acute intracranial hemorrhage. No focal mass lesion. No CT evidence of acute infarction. No midline shift or mass effect. No hydrocephalus. Basilar cisterns are patent. Vascular: No hyperdense vessel or unexpected calcification. Skull: Normal. Negative for fracture or focal lesion. Sinuses/Orbits: Paranasal sinuses and mastoid air cells are clear. Orbits  are clear. Other: None. IMPRESSION: 1. No acute intracranial findings. 2. No change from comparison exam. Electronically Signed   By: Suzy Bouchard M.D.   On: 08/17/2016 16:11   Mr Jodene Nam Head Wo Contrast  Result Date: 08/18/2016 CLINICAL DATA:  Recurrent episodes of right-sided tingling, numbness and weakness. Occasional headaches. Near syncopal event. EXAM: MRI HEAD WITHOUT CONTRAST MRA HEAD WITHOUT CONTRAST TECHNIQUE: Multiplanar, multiecho pulse sequences of the brain and surrounding structures were obtained without intravenous contrast. Angiographic images of the head were obtained using MRA technique without contrast. COMPARISON:  CT 08/17/2016.  MRI 06/02/2007. FINDINGS: MRI HEAD FINDINGS Brain: Diffusion imaging does not show any acute or subacute infarction. The brainstem and cerebellum are normal. Cerebral hemispheres show mild small vessel change of the hemispheric deep white matter. No cortical or large vessel territory infarction. No mass lesion, hemorrhage, hydrocephalus or extra-axial collection. No pituitary mass. Vascular: Major vessels at the base of the brain show flow. Skull and upper  cervical spine: Negative Sinuses/Orbits: Clear/normal Other: None MRA HEAD FINDINGS Both internal carotid arteries are widely patent into the brain. No siphon stenosis. 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 Slightly prominent vessel in a similar location on the right measuring about 3 mm. This could represent an infundibulum or a small aneurysm. The anterior and middle cerebral vessels are patent without proximal stenosis, aneurysm or vascular malformation. Both vertebral arteries are widely patent to the basilar. No basilar stenosis. Posterior circulation branch vessels appear normal. IMPRESSION: No acute brain finding. Mild chronic small-vessel change of the cerebral hemispheric white matter, progressive since 2009. 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 This was present in 2009 and appeared quite similar, but is better seen today because of technical improvements. Slight prominence in a similar location on the right measuring about 3 mm could represent a second aneurysm or infundibulum. Electronically Signed   By: Nelson Chimes M.D.   On: 08/18/2016 14:20   Mr Brain Wo Contrast  Result Date: 08/18/2016 CLINICAL DATA:  Recurrent episodes of right-sided tingling, numbness and weakness. Occasional headaches. Near syncopal event. EXAM: MRI HEAD WITHOUT CONTRAST MRA HEAD WITHOUT CONTRAST TECHNIQUE: Multiplanar, multiecho pulse sequences of the brain and surrounding structures were obtained without intravenous contrast. Angiographic images of the head were obtained using MRA technique without contrast. COMPARISON:  CT 08/17/2016.  MRI 06/02/2007. FINDINGS: MRI HEAD FINDINGS Brain: Diffusion imaging does not show any acute or subacute infarction. The brainstem and cerebellum are normal. Cerebral hemispheres show mild small vessel change of the hemispheric deep white matter. No cortical or large vessel territory infarction. No mass lesion, hemorrhage, hydrocephalus or extra-axial collection. No  pituitary mass. Vascular: Major vessels at the base of the brain show flow. Skull and upper cervical spine: Negative Sinuses/Orbits: Clear/normal Other: None MRA HEAD FINDINGS Both internal carotid arteries are widely patent into the brain. No siphon stenosis. 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 Slightly prominent vessel in a similar location on the right measuring about 3 mm. This could represent an infundibulum or a small aneurysm. The anterior and middle cerebral vessels are patent without proximal stenosis, aneurysm or vascular malformation. Both vertebral arteries are widely patent to the basilar. No basilar stenosis. Posterior circulation branch vessels appear normal. IMPRESSION: No acute brain finding. Mild chronic small-vessel change of the cerebral hemispheric white matter, progressive since 2009. 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 This was present in 2009 and appeared quite similar, but is better seen  today because of technical improvements. Slight prominence in a similar location on the right measuring about 3 mm could represent a second aneurysm or infundibulum. Electronically Signed   By: Nelson Chimes M.D.   On: 08/18/2016 14:20   Dg Chest Port 1 View  Result Date: 08/17/2016 CLINICAL DATA:  Sharp chest pain EXAM: PORTABLE CHEST 1 VIEW COMPARISON:  08/10/2016 FINDINGS: Mild cardiomegaly. Lungs are clear. No effusions or edema. No acute bony abnormality. IMPRESSION: Cardiomegaly.  No active disease. Electronically Signed   By: Rolm Baptise M.D.   On: 08/17/2016 12:48    Time Spent in minutes  30   Lala Lund M.D on 08/19/2016 at 9:58 AM  Between 7am to 7pm - Pager - [phone] ( page via [email or web address], text pages only, please mention full 10 digit call back number). After 7pm go to [email or web address] - password [facility]

## 2016-08-19 NOTE — Progress Notes (Signed)
  Echocardiogram 2D Echocardiogram has been performed.  Caitlyn Fuentes 08/19/2016, 10:47 AM

## 2016-08-19 NOTE — Progress Notes (Addendum)
[name] side is doing well. She still complains of mild perioral numbness, but otherwise is resolved.  She is awake and alert, interactive and appropriate.  MRI/MRA is negative, carotid Doppler is negative, EEG is negative and she was symptomatic during this.  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 This is evidenced by the psychotherapist positive as well as negative symptoms, negative EEG with symptoms, no evidence of MRI change despite persistent symptoms.  With her improvement, I don't think I would favor any further treatment or evaluation at this time.  Neurology will sign off, please call if any further questions or concerns.  I would consider outpatient neurosurgical or neuro IR referral for her aneurysm , unless she is following with somebody for this.  Roland Rack, MD [facility] [phone]  If 7pm- 7am, please page neurology on call as listed in [place].

## 2016-08-19 NOTE — Progress Notes (Signed)
CPAP set up at bedside. Settings changed to 10 cm H2o for pt comfort. Pt states she can place n herself when ready. I told her to call for further assistance.

## 2016-08-19 NOTE — Progress Notes (Signed)
[place] for Heparin + warfarin Indication: atrial fibrillation  Allergies  Allergen Reactions  . Diovan [Valsartan] Other (See Comments)    Messes up kidneys  . Lisinopril Swelling    Facial swelling    Patient Measurements: Height: 5' 3.5" (161.3 cm) Weight: 256 lb 1.6 oz (116.2 kg) IBW/kg (Calculated) : 53.55  Heparin dosing weight: 81.33 kg  Vital Signs: Temp: 97.8 F (36.6 C) (04/29 0510) Temp Source: Oral (04/29 0510) BP: 140/76 (04/29 0022) Pulse Rate: 75 (04/29 0022)  Labs:  Recent Labs  08/17/16 1250 08/17/16 1315  08/17/16 1659 08/17/16 2043 08/18/16 0211 08/18/16 1052 08/18/16 2211 08/19/16 0317  HGB 14.4 15.3*  --   --   --  14.1  --   --  12.6  HCT 42.3 45.0  --   --   --  41.3  --   --  38.5  PLT 338  --   --   --   --  323  --   --  307  LABPROT  --   --   < >  --   --  13.8 14.0  --  15.2  INR  --   --   < >  --   --  1.06 1.08  --  1.20  HEPARINUNFRC  --   --   --   --   --   --   --  0.70 0.65  CREATININE 2.60* 2.80*  --   --   --  2.69*  --   --  2.01*  TROPONINI  --   --   --  <0.03 <0.03 <0.03  --   --   --   < > = values in this interval not displayed.  Estimated Creatinine Clearance: 36.9 mL/min (A) (by C-G formula based on SCr of 2.01 mg/dL (H)).   Assessment: 61 yo female admitted on 08/17/2016 with near syncopal event. Pharmacy consulted to dose heparin bridge to therapeutic warfarin. PMHx of atrial fibrillation on warfarin PTA. Historically her compliance with warfarin has been poor and cardiology planned/intended for her to transition to apixaban 5 mg BID on 07/23/16 (gave her samples in office). However, pt reports that she has not yet begun taking apixaban as she wanted to finish her supply of warfarin first. Admit INR 1.07, last dose unknown.   HL therapeutic x 2 at 0.65. INR today remains SUBtherapeutic but trending up 1.06>1.2. CBC remains wnl and stable with no reported significant s/s  bleeding.   PTA Coumadin Dose: 5 mg daily except 7.5 mg on Fri per Coumadin clinic, pt unsure. Last Coumadin clinic note of 4/2 noted that she had not taken any Coumadin x 1 week; note on 02/13/16 had pt taking 5 daily except 7.5 on Fri and INR 2.6  Goal of Therapy:  HL 0.3-0.7 INR 2-3 Monitor platelets by anticoagulation protocol: Yes   Plan:  - Continue heparin gtt at 1200 units/hr - Warfarin 7.5 mg x 1 again this evening   - Daily HL, INR, CBC - ? If AKI resolves transition to apixaban while inpatient   Susana Duell K. Velva Harman, PharmD, BCPS, CPP Clinical Pharmacist Pager: [phone] Phone: [phone] 08/19/2016 9:03 AM

## 2016-08-19 NOTE — Progress Notes (Signed)
VASCULAR LAB PRELIMINARY  PRELIMINARY  PRELIMINARY  PRELIMINARY  Carotid duplex completed.    Preliminary report:  1-39% ICA plaquing. Vertebral artery flow is antegrade.   Shermaine Rivet, RVT 08/19/2016, 10:05 AM

## 2016-08-20 LAB — BASIC METABOLIC PANEL
Anion gap: 7 (ref 5–15)
BUN: 45 mg/dL — AB (ref 6–20)
CALCIUM: 8.9 mg/dL (ref 8.9–10.3)
CO2: 29 mmol/L (ref 22–32)
Chloride: 101 mmol/L (ref 101–111)
Creatinine, Ser: 1.79 mg/dL — ABNORMAL HIGH (ref 0.44–1.00)
GFR calc Af Amer: 34 mL/min — ABNORMAL LOW (ref >60)
GFR calc non Af Amer: 30 mL/min — ABNORMAL LOW (ref >60)
GLUCOSE: 94 mg/dL (ref 65–99)
Potassium: 4.3 mmol/L (ref 3.5–5.1)
SODIUM: 137 mmol/L (ref 135–145)

## 2016-08-20 LAB — HEPARIN LEVEL (UNFRACTIONATED): HEPARIN UNFRACTIONATED: 0.7 [IU]/mL (ref 0.30–0.70)

## 2016-08-20 LAB — VAS US CAROTID
LEFT ECA DIAS: -13 cm/s
LEFT VERTEBRAL DIAS: -11 cm/s
LICADSYS: -97 cm/s
LICAPDIAS: -15 cm/s
LICAPSYS: -40 cm/s
Left CCA dist dias: -24 cm/s
Left CCA dist sys: -92 cm/s
Left CCA prox dias: 34 cm/s
Left CCA prox sys: 109 cm/s
Left ICA dist dias: -42 cm/s
RIGHT ECA DIAS: -18 cm/s
RIGHT VERTEBRAL DIAS: -15 cm/s
Right CCA prox dias: -20 cm/s
Right CCA prox sys: -97 cm/s
Right cca dist sys: -63 cm/s

## 2016-08-20 LAB — CBC
HCT: 33.2 % — ABNORMAL LOW (ref 36.0–46.0)
HEMOGLOBIN: 11.7 g/dL — AB (ref 12.0–15.0)
MCH: 32.3 pg (ref 26.0–34.0)
MCHC: 35.2 g/dL (ref 30.0–36.0)
MCV: 91.7 fL (ref 78.0–100.0)
PLATELETS: 260 10*3/uL (ref 150–400)
RBC: 3.62 MIL/uL — AB (ref 3.87–5.11)
RDW: 13.5 % (ref 11.5–15.5)
WBC: 3.4 10*3/uL — ABNORMAL LOW (ref 4.0–10.5)

## 2016-08-20 LAB — CG4 I-STAT (LACTIC ACID): Lactic Acid, Venous: 2.05 mmol/L (ref 0.5–1.9)

## 2016-08-20 LAB — GLUCOSE, CAPILLARY: Glucose-Capillary: 91 mg/dL (ref 65–99)

## 2016-08-20 LAB — PROTIME-INR
INR: 1.29
Prothrombin Time: 16.1 seconds — ABNORMAL HIGH (ref 11.4–15.2)

## 2016-08-20 LAB — HIV ANTIBODY (ROUTINE TESTING W REFLEX): HIV Screen 4th Generation wRfx: NONREACTIVE

## 2016-08-20 LAB — MAGNESIUM: Magnesium: 2.8 mg/dL — ABNORMAL HIGH (ref 1.7–2.4)

## 2016-08-20 MED ORDER — TORSEMIDE 20 MG PO TABS: 20.0000 mg | ORAL_TABLET | Freq: Every day | ORAL | 0 refills | Status: DC

## 2016-08-20 MED ORDER — APIXABAN 5 MG PO TABS
5.0000 mg | ORAL_TABLET | Freq: Two times a day (BID) | ORAL | Status: DC
  Administered 2016-08-20: 5 mg via ORAL
  Filled 2016-08-20: qty 1

## 2016-08-20 MED ORDER — POTASSIUM CHLORIDE ER 20 MEQ PO TBCR: 20.0000 meq | EXTENDED_RELEASE_TABLET | Freq: Every day | ORAL | 0 refills | Status: DC

## 2016-08-20 NOTE — Plan of Care (Signed)
Problem: Activity: Goal: Risk for activity intolerance will decrease Outcome: Adequate for Discharge Home with PT/OT orders and walker and shower chair

## 2016-08-20 NOTE — Progress Notes (Signed)
RT called to room to assist pt with CPAP. Pt states she doesn't feel like she is getting enough air. States she was previously on auto settings and would like to be changed back.  Placed patient on auto titrate (16.0 cm max/5.0 cm min) cm H20.  Patient states she is comfortable at this time. Encouraged patient to call for assistance if needed.

## 2016-08-20 NOTE — Progress Notes (Signed)
RE: Benefit check  Received: Today  Message Contents  Delorse Lek CMA        Per rep at [facility] [employer]:   Eliquis: covered, no auth required, co-pay for 30 day retail is $99   Patient is eligible for co-pay assistance card.   Patient can use any pharmacy for 3 fills for 30 day supply, after that she will need [facility] day fills [facility].

## 2016-08-20 NOTE — Discharge Summary (Signed)
Caitlyn Fuentes MYT:[id number] DOB: 1955-08-28 DOA: 08/17/2016  PCP: [name], MD  Admit date: 08/17/2016  Discharge date: 08/20/2016  Admitted From: Home   Disposition:  Home   Recommendations for Outpatient Follow-up:   Follow up with PCP in 1-2 weeks  PCP Please obtain BMP/CBC, 2 view CXR in 1week,  (see Discharge instructions)   PCP Please follow up on the following pending results: Monitor BMP and diuretic dose closely   Home Health: PT, RN   Equipment/Devices: None  Consultations: Neuro Discharge Condition: Stable   CODE STATUS: Full   Diet Recommendation:  Heart Healthy with 1.5 L per day fluid restriction   Chief Complaint  Patient presents with  . Near Syncope     Brief history of present illness from the day of admission and additional interim summary    Caitlyn Whitesideis a 61 y.o.femalewith medical history significant for obesity, NICMwith EF 45% and associated diastolic dysfunction, PAFon eliquis, sleep apnea, and HLD. Patient went to a physician visit today and had a near syncopal episode, she was sent to the ER where she also complained of some right-sided pending numbness this happened on the day of admission and then again morning of 08/18/2016, she was also found to have some dehydration with acute renal failure due to recently increased diuretic dose.                                                                 Hospital Course   1.Chronic combined systolic and diastolic heart failure. Nonischemic cardio myopathy. Echo again shows EF of 45%. Currently Intravascularly dehydrated from overdiuresis, her high-dose diuretics were held and she was given gentle hydration. Compensated from CHF standpoint, no shortness of breath or rales. Per patient her edema has gone down from 3+ to trace.  Echo was nonacute and sensory unchanged from echo 1 year ago, she will be placed on low-dose diuretic at half home dose, counseled on fluid and salt restriction, resume other CHF medications which include Coreg and hydralazine, requested her to follow with PCP and her primary cardiologist within a week request post to monitor her BMP in diuretic dose closely.  2. ARF. Due to over diuresis, improved with hydration and holding of her diuretics, plan as in #1 above.  3. Syncope with right-sided paresthesias. Symptoms now appear to be intermittent and somewhat chronic for which she has seen outpatient neurology as well, seen by neurology here and cleared for discharge, etiology of her chronic symptoms remain not entirely clear, CT head, MRI brain and EEG unremarkable, A1c 4.8, stable echocardiogram and carotid duplex.  LDL < 70 on home dose statin, PT evaluation done and HHPT ordered.   4. Dyslipidemia continue statin and omega-3 fatty acids. Stable LDL.  5. Morbid obesity with obstructive sleep apnea. CPAP at night,  follow with PCP for weight loss.  6. Paroxysmal atrial fibrillation [place] vasc 2 score of at least 3. Resume Coreg, resumed Eliquis, she was started on Eliquis earlier this month by her primary cardiologist. Monitor renal function and liquid stools closely in the outpatient setting.   Lab Results  Component Value Date   CHOL 126 08/18/2016   HDL 39 (L) 08/18/2016   LDLCALC 51 08/18/2016   TRIG 178 (H) 08/18/2016   CHOLHDL 3.2 08/18/2016   Lab Results  Component Value Date   HGBA1C 4.8 08/18/2016        Discharge diagnosis     Principal Problem:   Near syncope Active Problems:   Morbid obesity (HCC)   Obstructive sleep apnea on CPAP   Paroxysmal atrial fibrillation (HCC)   Hyperlipidemia   Nonischemic cardiomyopathy (HCC)   Chronic combined systolic and diastolic heart failure (HCC)   Acute kidney injury (HCC)   Dizziness   Acute hypokalemia   Acute  hyponatremia   Chest tightness    Discharge instructions    Discharge Instructions    Discharge instructions    Complete by:  As directed    Follow with Primary MD [name], MD in 3 days   Get CBC, CMP, 2 view Chest X ray checked  by Primary MD or SNF MD in 3 days ( we routinely change or add medications that can affect your baseline labs and fluid status, therefore we recommend that you get the mentioned basic workup next visit with your PCP, your PCP may decide not to get them or add new tests based on their clinical decision)  Activity: As tolerated with Full fall precautions use walker/cane & assistance as needed  Disposition Home    Diet:  Heart Healthy   Check your Weight same time everyday, if you gain over 2 pounds, or you develop in leg swelling, experience more shortness of breath or chest pain, call your Primary MD immediately. Follow Cardiac Low Salt Diet and 1.5 lit/day fluid restriction.  On your next visit with your primary care physician please Get Medicines reviewed and adjusted.  Please request your Prim.MD to go over all Hospital Tests and Procedure/Radiological results at the follow up, please get all Hospital records sent to your Prim MD by signing hospital release before you go home.  If you experience worsening of your admission symptoms, develop shortness of breath, life threatening emergency, suicidal or homicidal thoughts you must seek medical attention immediately by calling 911 or calling your MD immediately  if symptoms less severe.  You Must read complete instructions/literature along with all the possible adverse reactions/side effects for all the Medicines you take and that have been prescribed to you. Take any new Medicines after you have completely understood and accpet all the possible adverse reactions/side effects.   Do not drive, operate heavy machinery, perform activities at heights, swimming or participation in water activities or provide baby  sitting services if your were admitted for syncope or siezures until you have seen by Primary MD or a Neurologist and advised to do so again.  Do not drive when taking Pain medications.    Do not take more than prescribed Pain, Sleep and Anxiety Medications  Special Instructions: If you have smoked or chewed Tobacco  in the last 2 yrs please stop smoking, stop any regular Alcohol  and or any Recreational drug use.  Wear Seat belts while driving.   Please note  You were cared for by a hospitalist during your  hospital stay. If you have any questions about your discharge medications or the care you received while you were in the hospital after you are discharged, you can call the unit and asked to speak with the hospitalist on call if the hospitalist that took care of you is not available. Once you are discharged, your primary care physician will handle any further medical issues. Please note that NO REFILLS for any discharge medications will be authorized once you are discharged, as it is imperative that you return to your primary care physician (or establish a relationship with a primary care physician if you do not have one) for your aftercare needs so that they can reassess your need for medications and monitor your lab values.   Increase activity slowly    Complete by:  As directed       Discharge Medications   Allergies as of 08/20/2016      Reactions   Diovan [valsartan] Other (See Comments)   Messes up kidneys   Lisinopril Swelling   Facial swelling      Medication List    STOP taking these medications   celecoxib 200 MG capsule Commonly known as:  CELEBREX   metolazone 2.5 MG tablet Commonly known as:  ZAROXOLYN   warfarin 5 MG tablet Commonly known as:  COUMADIN     TAKE these medications   allopurinol 100 MG tablet Commonly known as:  ZYLOPRIM Take 200 mg by mouth daily.   apixaban 5 MG Tabs tablet Commonly known as:  ELIQUIS Take 1 tablet (5 mg total) by  mouth 2 (two) times daily.   atorvastatin 40 MG tablet Commonly known as:  LIPITOR TAKE 1 TABLET (40 MG TOTAL) BY MOUTH DAILY.   carvedilol 12.5 MG tablet Commonly known as:  COREG Take 1 tablet (12.5 mg total) by mouth 2 (two) times daily.   colchicine 0.6 MG tablet Take 0.6 mg by mouth daily.   estradiol 2 MG tablet Commonly known as:  ESTRACE Take 2 mg by mouth daily.   hydrALAZINE 25 MG tablet Commonly known as:  APRESOLINE TAKE 1 TABLET (25 MG TOTAL) BY MOUTH 2 (TWO) TIMES DAILY.   levothyroxine 25 MCG tablet Commonly known as:  SYNTHROID, LEVOTHROID Take 25 mcg by mouth daily.   omega-3 acid ethyl esters 1 g capsule Commonly known as:  LOVAZA Take 1 g by mouth daily.   Potassium Chloride ER 20 MEQ Tbcr Take 20 mEq by mouth daily. What changed:  how much to take  when to take this  additional instructions   torsemide 20 MG tablet Commonly known as:  DEMADEX Take 1 tablet (20 mg total) by mouth daily. TAKE 2 (TWO) TABLETS BY MOUTH TWICE DAILY What changed:  how much to take   zolpidem 10 MG tablet Commonly known as:  AMBIEN Take 10 mg by mouth at bedtime as needed for sleep.       Follow-up Information    [name], MD. Schedule an appointment as soon as possible for a visit in 3 day(s).   Specialty:  Internal Medicine Contact information: [address] [place] [address] [phone]        Shelva Majestic, MD. Schedule an appointment as soon as possible for a visit in 1 week(s).   Specialty:  Cardiology Contact information: [address] [place] [address] [phone]           Major procedures and Radiology Reports - PLEASE review detailed and final reports thoroughly  -  CT Head  - -ve   MRI - No CVA  EEG - non acute  Carotid US - 1-39% ICA plaquing. Vertebral artery flow is antegrade.   TTE - Left ventricle: The cavity size was moderately dilated. Systolic   function was mildly to  moderately reduced. The estimated ejection   fraction was in the range of 40% to 45%. Wall motion was normal;   there were no regional wall motion abnormalities. Doppler   parameters are consistent with abnormal left ventricular   relaxation (grade 1 diastolic dysfunction). There was no evidence   of elevated ventricular filling pressure by Doppler parameters. - Aortic valve: Trileaflet; normal thickness leaflets. There was no   regurgitation. - Aortic root: The aortic root was normal in size. - Mitral valve: Structurally normal valve. There was no   regurgitation. - Left atrium: The atrium was mildly dilated. - Right ventricle: The cavity size was normal. Wall thickness was   normal. Systolic function was normal. - Right atrium: The atrium was normal in size. - Tricuspid valve: There was no regurgitation. - Pulmonary arteries: Systolic pressure could not be accurately   estimated. - Inferior vena cava: The vessel was normal in size. - Pericardium, extracardiac: There was no pericardial effusion.   Dg Chest 2 View  Result Date: 08/10/2016 CLINICAL DATA:  Shortness of breath over the last 2 days. EXAM: CHEST  2 VIEW COMPARISON:  08/16/2011 FINDINGS: There is cardiomegaly. There is venous hypertension with early interstitial edema. No consolidation or collapse. No visible effusion. No significant bone finding. IMPRESSION: Cardiomegaly, pulmonary venous hypertension and early interstitial edema. Electronically Signed   By: Nelson Chimes M.D.   On: 08/10/2016 22:43   Ct Head Wo Contrast  Result Date: 08/17/2016 CLINICAL DATA:  CT head with nausea and syncope EXAM: CT HEAD WITHOUT CONTRAST TECHNIQUE: Contiguous axial images were obtained from the base of the skull through the vertex without intravenous contrast. COMPARISON:  Brain MRI 06/01/1998 FINDINGS: Brain: No acute intracranial hemorrhage. No focal mass lesion. No CT evidence of acute infarction. No midline shift or mass effect. No  hydrocephalus. Basilar cisterns are patent. Vascular: No hyperdense vessel or unexpected calcification. Skull: Normal. Negative for fracture or focal lesion. Sinuses/Orbits: Paranasal sinuses and mastoid air cells are clear. Orbits are clear. Other: None. IMPRESSION: 1. No acute intracranial findings. 2. No change from comparison exam. Electronically Signed   By: Suzy Bouchard M.D.   On: 08/17/2016 16:11   Mr Jodene Nam Head Wo Contrast  Result Date: 08/18/2016 CLINICAL DATA:  Recurrent episodes of right-sided tingling, numbness and weakness. Occasional headaches. Near syncopal event. EXAM: MRI HEAD WITHOUT CONTRAST MRA HEAD WITHOUT CONTRAST TECHNIQUE: Multiplanar, multiecho pulse sequences of the brain and surrounding structures were obtained without intravenous contrast. Angiographic images of the head were obtained using MRA technique without contrast. COMPARISON:  CT 08/17/2016.  MRI 06/02/2007. FINDINGS: MRI HEAD FINDINGS Brain: Diffusion imaging does not show any acute or subacute infarction. The brainstem and cerebellum are normal. Cerebral hemispheres show mild small vessel change of the hemispheric deep white matter. No cortical or large vessel territory infarction. No mass lesion, hemorrhage, hydrocephalus or extra-axial collection. No pituitary mass. Vascular: Major vessels at the base of the brain show flow. Skull and upper cervical spine: Negative Sinuses/Orbits: Clear/normal Other: None MRA HEAD FINDINGS Both internal carotid arteries are widely patent into the brain. No siphon stenosis. 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 Slightly prominent vessel in a similar location on the  right measuring about 3 mm. This could represent an infundibulum or a small aneurysm. The anterior and middle cerebral vessels are patent without proximal stenosis, aneurysm or vascular malformation. Both vertebral arteries are widely patent to the basilar. No basilar stenosis. Posterior circulation branch vessels  appear normal. IMPRESSION: No acute brain finding. Mild chronic small-vessel change of the cerebral hemispheric white matter, progressive since 2009. 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 This was present in 2009 and appeared quite similar, but is better seen today because of technical improvements. Slight prominence in a similar location on the right measuring about 3 mm could represent a second aneurysm or infundibulum. Electronically Signed   By: Nelson Chimes M.D.   On: 08/18/2016 14:20   Mr Brain Wo Contrast  Result Date: 08/18/2016 CLINICAL DATA:  Recurrent episodes of right-sided tingling, numbness and weakness. Occasional headaches. Near syncopal event. EXAM: MRI HEAD WITHOUT CONTRAST MRA HEAD WITHOUT CONTRAST TECHNIQUE: Multiplanar, multiecho pulse sequences of the brain and surrounding structures were obtained without intravenous contrast. Angiographic images of the head were obtained using MRA technique without contrast. COMPARISON:  CT 08/17/2016.  MRI 06/02/2007. FINDINGS: MRI HEAD FINDINGS Brain: Diffusion imaging does not show any acute or subacute infarction. The brainstem and cerebellum are normal. Cerebral hemispheres show mild small vessel change of the hemispheric deep white matter. No cortical or large vessel territory infarction. No mass lesion, hemorrhage, hydrocephalus or extra-axial collection. No pituitary mass. Vascular: Major vessels at the base of the brain show flow. Skull and upper cervical spine: Negative Sinuses/Orbits: Clear/normal Other: None MRA HEAD FINDINGS Both internal carotid arteries are widely patent into the brain. No siphon stenosis. 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 Slightly prominent vessel in a similar location on the right measuring about 3 mm. This could represent an infundibulum or a small aneurysm. The anterior and middle cerebral vessels are patent without proximal stenosis, aneurysm or vascular malformation. Both vertebral arteries are  widely patent to the basilar. No basilar stenosis. Posterior circulation branch vessels appear normal. IMPRESSION: No acute brain finding. Mild chronic small-vessel change of the cerebral hemispheric white matter, progressive since 2009. 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 This was present in 2009 and appeared quite similar, but is better seen today because of technical improvements. Slight prominence in a similar location on the right measuring about 3 mm could represent a second aneurysm or infundibulum. Electronically Signed   By: Nelson Chimes M.D.   On: 08/18/2016 14:20   Dg Chest Port 1 View  Result Date: 08/17/2016 CLINICAL DATA:  Sharp chest pain EXAM: PORTABLE CHEST 1 VIEW COMPARISON:  08/10/2016 FINDINGS: Mild cardiomegaly. Lungs are clear. No effusions or edema. No acute bony abnormality. IMPRESSION: Cardiomegaly.  No active disease. Electronically Signed   By: Rolm Baptise M.D.   On: 08/17/2016 12:48    Micro Results     No results found for this or any previous visit (from the past 240 hour(s)).  Today   Subjective    Caitlyn Fuentes today has no headache,no chest abdominal pain,no new weakness tingling or numbness, feels much better wants to go home today.     Objective   Blood pressure 111/72, pulse 81, temperature 97.7 F (36.5 C), temperature source Oral, resp. rate 18, height 5' 3.5" (1.613 m), weight 117.7 kg (259 lb 6.4 oz), SpO2 98 %.   Intake/Output Summary (Last 24 hours) at 08/20/16 1056 Last data filed at 08/20/16 1032  Gross per 24  hour  Intake              540 ml  Output             1500 ml  Net             -960 ml    Exam Awake Alert, Oriented x 3, No new F.N deficits, Normal affect [place].AT,PERRAL Supple Neck,No JVD, No cervical lymphadenopathy appriciated.  Symmetrical Chest wall movement, Good air movement bilaterally, CTAB RRR,No Gallops,Rubs or new Murmurs, No Parasternal Heave +ve B.Sounds, Abd Soft, Non tender, No organomegaly  appriciated, No rebound -guarding or rigidity. No Cyanosis, Clubbing , 1+ edema, No new Rash or bruise   Data Review   CBC w Diff: Lab Results  Component Value Date   WBC 3.4 (L) 08/20/2016   HGB 11.7 (L) 08/20/2016   HCT 33.2 (L) 08/20/2016   PLT 260 08/20/2016   LYMPHOPCT 30 08/17/2016   MONOPCT 18 08/17/2016   EOSPCT 1 08/17/2016   BASOPCT 0 08/17/2016    CMP: Lab Results  Component Value Date   NA 137 08/20/2016   K 4.3 08/20/2016   CL 101 08/20/2016   CO2 29 08/20/2016   BUN 45 (H) 08/20/2016   CREATININE 1.79 (H) 08/20/2016   CREATININE 1.05 05/07/2014   PROT 7.9 08/18/2016   ALBUMIN 4.4 08/18/2016   BILITOT 0.7 08/18/2016   ALKPHOS 95 08/18/2016   AST 25 08/18/2016   ALT 16 08/18/2016  .   Total Time in preparing paper work, data evaluation and todays exam - 1 minutes  Lala Lund M.D on 08/20/2016 at 10:56 AM  [facility]   Office  [phone]

## 2016-08-20 NOTE — Progress Notes (Signed)
Alpha for Heparin + warfarin Indication: atrial fibrillation  Allergies  Allergen Reactions  . Diovan [Valsartan] Other (See Comments)    Messes up kidneys  . Lisinopril Swelling    Facial swelling    Patient Measurements: Height: 5' 3.5" (161.3 cm) Weight: 259 lb 6.4 oz (117.7 kg) IBW/kg (Calculated) : 53.55  Heparin dosing weight: 81.3 kg  Vital Signs: Temp: 97.7 F (36.5 C) (04/30 0542) Temp Source: Oral (04/30 0542) BP: 138/72 (04/30 0542) Pulse Rate: 70 (04/30 0542)  Labs:  Recent Labs  08/17/16 1659 08/17/16 2043 08/18/16 0211 08/18/16 1052 08/18/16 2211 08/19/16 0317 08/20/16 0341  HGB  --   --  14.1  --   --  12.6 11.7*  HCT  --   --  41.3  --   --  38.5 33.2*  PLT  --   --  323  --   --  307 260  LABPROT  --   --  13.8 14.0  --  15.2 16.1*  INR  --   --  1.06 1.08  --  1.20 1.29  HEPARINUNFRC  --   --   --   --  0.70 0.65 0.70  CREATININE  --   --  2.69*  --   --  2.01* 1.79*  TROPONINI <0.03 <0.03 <0.03  --   --   --   --     Estimated Creatinine Clearance: 41.8 mL/min (A) (by C-G formula based on SCr of 1.79 mg/dL (H)).   Assessment: 61 yo female admitted on 08/17/2016 with near syncopal event. Pharmacy consulted to dose heparin bridge to warfarin. PMHx of atrial fibrillation on warfarin PTA. Historically, her compliance with warfarin has been poor and cardiology planned/intended for her to transition to apixaban 5 mg BID on 07/23/16 (gave her samples in office). However, pt reports that she has not yet begun taking apixaban, as she wanted to finish her supply of warfarin first. Admit INR 1.07, last dose unknown.   Pharmacy consulted to transition to heparin to apixaban with improving renal function - SCr down to 1.79 today, CrCl~42. Ok to transition today with INR remaining subtherapeutic at 1.29. CBC remains wnl. No bleed reported per RN.   Goal of Therapy:  Stroke prevention Monitor platelets by  anticoagulation protocol: Yes   Plan:  - D/c heparin drip at time of 1st dose of apixaban (communicated with RN) - Apixaban 5mg  PO BID - Monitor CBC, s/sx bleeding   Elicia Lamp, PharmD, BCPS Clinical Pharmacist 08/20/2016 10:15 AM

## 2016-08-20 NOTE — Progress Notes (Signed)
     Caitlyn Fuentes was admitted to the Hospital on 08/17/2016 and Discharged  08/20/2016 and should be excused from work/school   for 7  days starting from date -  08/17/2016 , may return to work/school without any restrictions.  Call Lala Lund MD, [facility]  [phone] with questions.  Lala Lund M.D on 08/20/2016,at 2:48 PM  [facility]   Office  [phone]

## 2016-08-20 NOTE — Progress Notes (Signed)
SATURATION QUALIFICATIONS: (This note is used to comply with regulatory documentation for home oxygen)  Patient Saturations on Room Air at Rest = 98%  Patient Saturations on Room Air while Ambulating = 95%  Patient Saturations on N/A Liters of oxygen while Ambulating = N/A%  Please briefly explain why patient needs home oxygen: Oxygen not applied since 02 saturation did not drop with ambulation

## 2016-08-20 NOTE — Discharge Instructions (Addendum)
Follow with Primary MD [name], MD in 3 days   Get CBC, CMP, 2 view Chest X ray checked  by Primary MD or SNF MD in 3 days ( we routinely change or add medications that can affect your baseline labs and fluid status, therefore we recommend that you get the mentioned basic workup next visit with your PCP, your PCP may decide not to get them or add new tests based on their clinical decision)  Activity: As tolerated with Full fall precautions use walker/cane & assistance as needed  Disposition Home    Diet:  Heart Healthy   Check your Weight same time everyday, if you gain over 2 pounds, or you develop in leg swelling, experience more shortness of breath or chest pain, call your Primary MD immediately. Follow Cardiac Low Salt Diet and 1.5 lit/day fluid restriction.  On your next visit with your primary care physician please Get Medicines reviewed and adjusted.  Please request your Prim.MD to go over all Hospital Tests and Procedure/Radiological results at the follow up, please get all Hospital records sent to your Prim MD by signing hospital release before you go home.  If you experience worsening of your admission symptoms, develop shortness of breath, life threatening emergency, suicidal or homicidal thoughts you must seek medical attention immediately by calling 911 or calling your MD immediately  if symptoms less severe.  You Must read complete instructions/literature along with all the possible adverse reactions/side effects for all the Medicines you take and that have been prescribed to you. Take any new Medicines after you have completely understood and accpet all the possible adverse reactions/side effects.   Do not drive, operate heavy machinery, perform activities at heights, swimming or participation in water activities or provide baby sitting services if your were admitted for syncope or siezures until you have seen by Primary MD or a Neurologist and advised to do so again.  Do not  drive when taking Pain medications.    Do not take more than prescribed Pain, Sleep and Anxiety Medications  Special Instructions: If you have smoked or chewed Tobacco  in the last 2 yrs please stop smoking, stop any regular Alcohol  and or any Recreational drug use.  Wear Seat belts while driving.   Please note  You were cared for by a hospitalist during your hospital stay. If you have any questions about your discharge medications or the care you received while you were in the hospital after you are discharged, you can call the unit and asked to speak with the hospitalist on call if the hospitalist that took care of you is not available. Once you are discharged, your primary care physician will handle any further medical issues. Please note that NO REFILLS for any discharge medications will be authorized once you are discharged, as it is imperative that you return to your primary care physician (or establish a relationship with a primary care physician if you do not have one) for your aftercare needs so that they can reassess your need for medications and monitor your lab values.    Information on my medicine - ELIQUIS (apixaban)  This medication education was reviewed with me or my healthcare representative as part of my discharge preparation.  Why was Eliquis prescribed for you? Eliquis was prescribed for you to reduce the risk of a blood clot forming that can cause a stroke if you have a medical condition called atrial fibrillation (a type of irregular heartbeat).  What do You need  to know about Eliquis ? Take your Eliquis TWICE DAILY - one tablet in the morning and one tablet in the evening with or without food. If you have difficulty swallowing the tablet whole please discuss with your pharmacist how to take the medication safely.  Take Eliquis exactly as prescribed by your doctor and DO NOT stop taking Eliquis without talking to the doctor who prescribed the medication.   Stopping may increase your risk of developing a stroke.  Refill your prescription before you run out.  After discharge, you should have regular check-up appointments with your healthcare provider that is prescribing your Eliquis.  In the future your dose may need to be changed if your kidney function or weight changes by a significant amount or as you get older.  What do you do if you miss a dose? If you miss a dose, take it as soon as you remember on the same day and resume taking twice daily.  Do not take more than one dose of ELIQUIS at the same time to make up a missed dose.  Important Safety Information A possible side effect of Eliquis is bleeding. You should call your healthcare provider right away if you experience any of the following: ? Bleeding from an injury or your nose that does not stop. ? Unusual colored urine (red or dark brown) or unusual colored stools (red or black). ? Unusual bruising for unknown reasons. ? A serious fall or if you hit your head (even if there is no bleeding).  Some medicines may interact with Eliquis and might increase your risk of bleeding or clotting while on Eliquis. To help avoid this, consult your healthcare provider or pharmacist prior to using any new prescription or non-prescription medications, including herbals, vitamins, non-steroidal anti-inflammatory drugs (NSAIDs) and supplements.  This website has more information on Eliquis (apixaban): http://www.eliquis.com/eliquis/home

## 2016-08-20 NOTE — Telephone Encounter (Signed)
thx for letting me know 

## 2016-08-20 NOTE — Progress Notes (Signed)
Physical Therapy Treatment Patient Details Name: Caitlyn Fuentes MRN: [id number] DOB: 09/18/1955 Today's Date: 08/20/2016    History of Present Illness Pt is a 61 y/o female admitted secondary to a near syncopal episode at a physician visit with dizziness and R sided facial numbness and tingling. Pt's symptoms resolved but in ED was found to have an AKI. Further workup is pending. PMH including but not limited to CHF, lupus, a-fib, HTN and HLD.    PT Comments    Pt is up to exercises with PT but fatigued from just having walked with nursing to ck sats.  Her plan is to continue therapy with HHPT and will progress her acutely as able depending on her length of stay if DC is delayed.  Continue as ordered for gait and strengthening of LE's.   Follow Up Recommendations  Home health PT;Supervision for mobility/OOB     Equipment Recommendations  Rolling walker with 5" wheels    Recommendations for Other Services       Precautions / Restrictions Precautions Precautions: Fall Restrictions Weight Bearing Restrictions: No    Mobility  Bed Mobility               General bed mobility comments: pt sitting EOB whent therapist entered room  Transfers Overall transfer level: Needs assistance Equipment used: 1 person hand held assist Transfers: Sit to/from Stand Sit to Stand: Supervision         General transfer comment: cued hand placement  Ambulation/Gait                 Stairs            Wheelchair Mobility    Modified Rankin (Stroke Patients Only)       Balance     Sitting balance-Leahy Scale: Good                                      Cognition Arousal/Alertness: Awake/alert Behavior During Therapy: WFL for tasks assessed/performed Overall Cognitive Status: Within Functional Limits for tasks assessed                                        Exercises General Exercises - Lower Extremity Ankle Circles/Pumps:  AROM;Both;10 reps Quad Sets: AROM;Both;10 reps Long Arc Quad: Strengthening;Both;10 reps Heel Slides: Strengthening;Both;10 reps Hip ABduction/ADduction: AROM;Both;10 reps    General Comments        Pertinent Vitals/Pain Pain Assessment: No/denies pain    Home Living                      Prior Function            PT Goals (current goals can now be found in the care plan section) Acute Rehab PT Goals Patient Stated Goal: to get home soon    Frequency    Min 3X/week      PT Plan Current plan remains appropriate    Co-evaluation              AM-PAC PT "6 Clicks" Daily Activity  Outcome Measure  Difficulty turning over in bed (including adjusting bedclothes, sheets and blankets)?: A Little Difficulty moving from lying on back to sitting on the side of the bed? : A Little Difficulty sitting down on and standing up from a chair with  arms (e.g., wheelchair, bedside commode, etc,.)?: A Little Help needed moving to and from a bed to chair (including a wheelchair)?: A Little Help needed walking in hospital room?: A Little Help needed climbing 3-5 steps with a railing? : A Little 6 Click Score: 18    End of Session         PT Visit Diagnosis: Unsteadiness on feet (R26.81);Other abnormalities of gait and mobility (R26.89);Muscle weakness (generalized) (M62.81)     Time: 3968-8648 PT Time Calculation (min) (ACUTE ONLY): 16 min  Charges:  $Therapeutic Exercise: 8-22 mins                    G Codes:  Functional Assessment Tool Used: AM-PAC 6 Clicks Basic Mobility;Clinical judgement     Ramond Dial 08/20/2016, 2:20 PM   Mee Hives, PT MS Acute Rehab Dept. Number: [place]

## 2016-08-20 NOTE — Care Management Note (Signed)
Case Management Note  Patient Details  Name: Caitlyn Fuentes MRN: [id number] Date of Birth: 08/13/1955  Subjective/Objective:  Admitted with Near Syncope , CHF               Action/Plan: Patient lives at home with her spouse; PCP is Dr [name]; has private insurance with [employer] with prescription drug coverage; [facility]; pt reports no problem getting her medication- new Eliquis, coupon card given to patient with explain explanation of usage and Benefit check in progress to determine her co pay; [place] choices offered, pt chose [facility] [place] [facility]); Mary with [facility] called for arrangements; Also ordered DME- walker and 3:1 to be delivered to her room today prior to discharging home.  Expected Discharge Date:  08/20/16               Expected Discharge Plan:  [place]  Discharge planning Services  CM Consult  Choice offered to:  Patient  DME Arranged:  Walker rolling, 3-N-1 DME Agency:  [place]:  RN, PT [facility] Agency:  [facility] ([facility])  Status of Service:  In process, will continue to follow  Royston Bake, RN ,MHA,BSN 08/20/2016, 11:39 AM

## 2016-08-20 NOTE — Progress Notes (Signed)
Caitlyn Fuentes discharged via transport home by spouse.  Paper prescriptions, including packet for Eliquis given to patient.  All discharge instructions reviewed with patient and her spouse and both stated understanding.  Patient took walker and shower chair equipment.  She had no voiced complaints.

## 2016-08-21 NOTE — Telephone Encounter (Signed)
Agree with above note. EMS to ER.  Candee Furbish, MD

## 2016-08-28 ENCOUNTER — Ambulatory Visit (INDEPENDENT_AMBULATORY_CARE_PROVIDER_SITE_OTHER): Payer: 59 | Admitting: Physician Assistant

## 2016-08-28 ENCOUNTER — Encounter: Payer: Self-pay | Admitting: *Deleted

## 2016-08-28 ENCOUNTER — Encounter: Payer: Self-pay | Admitting: Physician Assistant

## 2016-08-28 VITALS — BP 122/88 | HR 72 | Ht 63.5 in | Wt 260.4 lb

## 2016-08-28 DIAGNOSIS — I5042 Chronic combined systolic (congestive) and diastolic (congestive) heart failure: Secondary | ICD-10-CM

## 2016-08-28 DIAGNOSIS — I10 Essential (primary) hypertension: Secondary | ICD-10-CM | POA: Diagnosis not present

## 2016-08-28 DIAGNOSIS — N183 Chronic kidney disease, stage 3 unspecified: Secondary | ICD-10-CM | POA: Insufficient documentation

## 2016-08-28 DIAGNOSIS — I480 Paroxysmal atrial fibrillation: Secondary | ICD-10-CM

## 2016-08-28 DIAGNOSIS — R0789 Other chest pain: Secondary | ICD-10-CM

## 2016-08-28 MED ORDER — FAMOTIDINE 20 MG PO TABS: 20.0000 mg | ORAL_TABLET | ORAL | Status: DC

## 2016-08-28 NOTE — Progress Notes (Signed)
Cardiology Office Note:    Date:  08/28/2016   ID:  Caitlyn Fuentes, DOB [birth year], MRN [id number]  PCP:  [name], MD  Cardiologist:  Dr. Shelva Majestic   Electrophysiologist:  n/a Nephrologist: Dr. Justin Mend  Referring MD: [name], MD   Chief Complaint  Patient presents with  . Hospitalization Follow-up    AKI in setting of over-diuresis    History of Present Illness:    Caitlyn Fuentes is a 61 y.o. female with a hx of atrial fibrillation, systolic CHF secondary to nonischemic cardiomyopathy, normal coronary arteries by cardiac catheterization in 2011, sleep apnea on CPAP in the past, obesity, HTN, CKD, lupus, venous insufficiency. She has had difficulty with adherence to Coumadin and was changed to Eliquis in 4/18.    She was seen 08/13/16 after a visit to the ED for volume excess.  I gave her a dose of Metolazone and had her follow up later that week.  But, when she returned for her appointment, she became unresponsive in the waiting room and was sent to the ED emergently b/c there was concern for acute stroke.  She was admitted 4/27-4/30. Stroke was ruled out. Head CT was neg.  MRI was neg for acute CVA.  Echo showed stable LVF with EF 40-45.  Carotid US was neg for significant ICA stenosis.  She did show evidence of dehydration/over-diuresis.  She did admit to taking metolazone daily instead of just 1. Serum creatinine was 2.6 (SCr was 1.17 on 08/10/16) upon admission and peaked at 2.8. Diuretics were held but resumed at half dose at discharge. Of note, patient has seen neurology in the past for similar presenting symptoms.  She returns for Cardiology follow up.  She is here today with her husband. Her breathing is improved. However, she does note shortness of breath with some activities. She continues to have a nonproductive cough. She has occasional chest tightness with activity. She denies syncope. She sleeps on an incline chronically. She has occasional nausea. She saw her  nephrologist last week. Follow-up labs were obtained. Her hydralazine was stopped secondary to hypotension.  Prior CV studies:   The following studies were reviewed today:  Echo 08/19/16 EF 27-06, grade 1 diastolic dysfunction, mild LAE  Carotid US 08/19/16 Bilateral ICA 1-39  Echo 01/06/16 EF 45-50, diffuse HK, grade 1 diastolic dysfunction, mild MR   Carotid US 9/17 Bilateral ICA 1-39   Holter monitor 2/17 Frequent PACs and less frequent PVCs One 4 beat run of wide complex v-tach She may have had 3-4 second run of afib but more likely atrial tach(page 30).   Myoview 5/13 EF 56, no ischemia, low risk   Echo 1/13 EF 40-45, moderate LAE, RVSP 40   LHC 5/11 LM normal LAD normal LCx normal RCA normal EF 40   Past Medical History:  Diagnosis Date  . A-fib ([place])   . Cardiomyopathy   . Hyperlipidemia   . Hypertension   . Lupus   . Pulmonary hypertension ([place]) 05/10/2011   Echo, EF-40-45  . Renal disorder   . Sleep apnea 2008   CPAP, pt does not know settings  . Thyroid disease     Past Surgical History:  Procedure Laterality Date  .  c sections  x 2  . ABDOMINAL HYSTERECTOMY     complete  . CARDIAC CATHETERIZATION     Nonischemic, EF-40, continued medical therapy  . CARDIAC CATHETERIZATION  10/02/2006   No significant CAD  . COLONOSCOPY WITH PROPOFOL N/A 11/22/2015  Procedure: COLONOSCOPY WITH PROPOFOL;  Surgeon: Juanita Craver, MD;  Location: [facility];  Service: Endoscopy;  Laterality: N/A;  . ESOPHAGOGASTRODUODENOSCOPY (EGD) WITH PROPOFOL N/A 11/22/2015   Procedure: ESOPHAGOGASTRODUODENOSCOPY (EGD) WITH PROPOFOL;  Surgeon: Juanita Craver, MD;  Location: [facility];  Service: Endoscopy;  Laterality: N/A;    Current Medications: Current Meds  Medication Sig  . allopurinol (ZYLOPRIM) 100 MG tablet Take 200 mg by mouth daily.   Marland Kitchen apixaban (ELIQUIS) 5 MG TABS tablet Take 1 tablet (5 mg total) by mouth 2 (two) times daily.  Marland Kitchen atorvastatin (LIPITOR) 40 MG tablet  TAKE 1 TABLET (40 MG TOTAL) BY MOUTH DAILY.  . carvedilol (COREG) 12.5 MG tablet Take 1 tablet (12.5 mg total) by mouth 2 (two) times daily.  . colchicine 0.6 MG tablet Take 0.6 mg by mouth daily.  Marland Kitchen estradiol (ESTRACE) 2 MG tablet Take 2 mg by mouth daily.   Marland Kitchen levothyroxine (SYNTHROID, LEVOTHROID) 25 MCG tablet Take 25 mcg by mouth daily.  Marland Kitchen omega-3 acid ethyl esters (LOVAZA) 1 g capsule Take 1 g by mouth daily.  . Potassium Chloride ER 20 MEQ TBCR Take 20 mEq by mouth daily.  Marland Kitchen torsemide (DEMADEX) 20 MG tablet Take 1 tablet (20 mg total) by mouth daily. TAKE 2 (TWO) TABLETS BY MOUTH TWICE DAILY  . zolpidem (AMBIEN) 10 MG tablet Take 10 mg by mouth at bedtime as needed for sleep.      Allergies:   Diovan [valsartan]; Lisinopril; and Metolazone   Social History   Social History  . Marital status: Married    Spouse name: N/A  . Number of children: N/A  . Years of education: N/A   Social History Main Topics  . Smoking status: Former Smoker    Quit date: 12/25/2008  . Smokeless tobacco: Never Used  . Alcohol use 0.0 oz/week     Comment: once a month  . Drug use: No  . Sexual activity: Not Asked   Other Topics Concern  . None   Social History Narrative  . None     Family History  Problem Relation Age of Onset  . Heart disease Mother   . Heart disease Father   . Lung disease Sister   . Heart disease Brother   . Hypertension Sister      ROS:   Please see the history of present illness.    Review of Systems  Cardiovascular: Positive for chest pain and dyspnea on exertion.   All other systems reviewed and are negative.   EKGs/Labs/Other Test Reviewed:    EKG:  EKG is  ordered today.  The ekg ordered today demonstrates NSR, HR 72, NSSTTW changes, QTc 416 ms, similar to prior tracings  Recent Labs: 08/17/2016: B Natriuretic Peptide 21.0 08/18/2016: ALT 16 08/20/2016: BUN 45; Creatinine, Ser 1.79; Hemoglobin 11.7; Magnesium 2.8; Platelets 260; Potassium 4.3; Sodium 137    Recent Lipid Panel    Component Value Date/Time   CHOL 126 08/18/2016 0827   TRIG 178 (H) 08/18/2016 0827   HDL 39 (L) 08/18/2016 0827   CHOLHDL 3.2 08/18/2016 0827   VLDL 36 08/18/2016 0827   LDLCALC 51 08/18/2016 0827     Physical Exam:    VS:  BP 122/88   Pulse 72   Ht 5' 3.5" (1.613 m)   Wt 260 lb 6.4 oz (118.1 kg)   SpO2 97%   BMI 45.40 kg/m     Wt Readings from Last 3 Encounters:  08/28/16 260 lb 6.4 oz (118.1 kg)  08/20/16 259 lb 6.4 oz (117.7 kg)  08/13/16 264 lb 9.6 oz (120 kg)     Physical Exam  Constitutional: She is oriented to person, place, and time. She appears well-developed and well-nourished. No distress.  HENT:  Head: Normocephalic and atraumatic.  Eyes: No scleral icterus.  Neck: Normal range of motion. No JVD (I cannot assess JVD) present.  Cardiovascular: Normal rate, regular rhythm, S1 normal, S2 normal and normal heart sounds.   No murmur heard. Pulmonary/Chest: Breath sounds normal. She has no wheezes. She has no rhonchi. She has no rales.  Abdominal: Soft. There is no tenderness.  Musculoskeletal: She exhibits no edema (compression stockings in place).  Neurological: She is alert and oriented to person, place, and time.  Skin: Skin is warm and dry.  Psychiatric: She has a normal mood and affect.    ASSESSMENT:    1. Chronic combined systolic and diastolic heart failure (HCC)   2. Paroxysmal atrial fibrillation ([place])   3. CKD (chronic kidney disease) stage 3, GFR 30-59 ml/min   4. Essential hypertension   5. Other chest pain    PLAN:    In order of problems listed above:  1. Chronic combined systolic and diastolic heart failure (HCC) -  Overall her volume appears stable. I would avoid Metolazone in the future as she developed AKI after 2 doses.  She tells me she was not taking it QD as we previously thought.  She had labs done with Dr. Justin Mend last week.  We will request those.  We discussed monitoring her weight and to call for a 3  lb weight gain in 1 day.   2. Paroxysmal atrial fibrillation (HCC) -  Maintaining NSR.  Continue Eliquis. She did have some rectal bleeding after a hard BM.  I have asked her to follow up with her GI (Dr. Collene Mares).    3. CKD (chronic kidney disease) stage 3, GFR 30-59 ml/min - FU with Nephrology as planned.   4. Essential hypertension - BP controlled.  5. Other chest pain -  Atypical and typical.  No ECG changes.  Low risk Myoview in 2013.  Neg heart cath in 2011.  She has a non-productive cough. Will try Pepcid x 2 weeks and FU with GI.  Doubtful she has obstructive CAD.  If H2RA does not provide much relief, consider stress testing.    Dispo:  Return in about 6 weeks (around 10/12/2016) for Scheduled Follow Up with Dr. Claiborne Billings.   Medication Adjustments/Labs and Tests Ordered: Current medicines are reviewed at length with the patient today.  Concerns regarding medicines are outlined above.  Orders/Tests:  Orders Placed This Encounter  Procedures  . EKG 12-Lead   Medication changes: Meds ordered this encounter  Medications  . famotidine (PEPCID) 20 MG tablet    Sig: Take 1 tablet (20 mg total) by mouth as directed. 1 tablet twice daily for 2 weeks then as needed   Signed, Richardson Dopp, PA-C  08/28/2016 4:58 PM    [address], [place], [place]  [address] Phone: [phone]; Fax: [phone]

## 2016-08-28 NOTE — Patient Instructions (Addendum)
Medication Instructions:  1. START OTC PEPCID 20 MG 1 TABLET TWICE DAILY FOR 2 WEEKS THEN TAKE AS NEEDED;   Labwork: NONE ORDERED  Testing/Procedures: NONE ORDERED  Follow-Up: KEEP YOUR APPT WITH DR. Claiborne Billings 10/12/16 AT THE [facility]ATION  Any Other Special Instructions Will Be Listed Below (If Applicable). PER SCOTT WEAVER, PAC YOU WILL NEED TO CALL YOUR GASTROENTEROLOGIST ABOUT RECTAL BLEEDING.  If you need a refill on your cardiac medications before your next appointment, please call your pharmacy.

## 2016-09-03 ENCOUNTER — Telehealth: Payer: Self-pay | Admitting: Physician Assistant

## 2016-09-03 NOTE — Telephone Encounter (Signed)
[facility], to inform them that pt is no longer taking this medication. Pharmacy verbalized understanding.

## 2016-09-03 NOTE — Telephone Encounter (Signed)
Lorane Gell, [facility]ist at [facility] is calling in regards to metolazone 2.5 mg medication for patient. He states that patient is out of refills and is Ilona Sorrel is requesting a prescription for a 90 day supply. Please call to discuss, thanks.

## 2016-09-07 ENCOUNTER — Telehealth: Payer: Self-pay | Admitting: Cardiovascular Disease

## 2016-09-07 NOTE — Telephone Encounter (Signed)
09/07/2016 Received faxed medical records for upcoming with Dr. Claiborne Billings on 10/12/2016.  Records given to [facility].  cbr

## 2016-09-12 ENCOUNTER — Other Ambulatory Visit: Payer: Self-pay | Admitting: Cardiovascular Disease

## 2016-10-09 ENCOUNTER — Telehealth: Payer: Self-pay | Admitting: Cardiovascular Disease

## 2016-10-09 NOTE — Telephone Encounter (Signed)
Spoke with Caitlyn Fuentes and patient has had increase salt intake and swelling but patient denies being shortness of breath. [name] RN discussed with [name] PA and will just continue same dose of medications and decrease salt intake secondary to April hospitalization. Keep appointment as scheduled for 10/12/16 and call back if any changes. Caitlyn Fuentes, verbalized understanding.

## 2016-10-09 NOTE — Telephone Encounter (Signed)
New message    Pam from [facility] is calling because pt has gained 7 lbs in a 2 day window. Pam states that pt takes torsemide 20 mg-twice a day.

## 2016-10-09 NOTE — Telephone Encounter (Signed)
New Message   Caitlyn Fuentes from [facility] requesting call back regarding patient's weight gain, and her taking torsemide. Requesting call back

## 2016-10-09 NOTE — Telephone Encounter (Signed)
Left message to call back with [name] from [facility].

## 2016-10-11 ENCOUNTER — Other Ambulatory Visit: Payer: Self-pay | Admitting: Internal Medicine

## 2016-10-11 DIAGNOSIS — M79602 Pain in left arm: Secondary | ICD-10-CM

## 2016-10-12 ENCOUNTER — Ambulatory Visit (INDEPENDENT_AMBULATORY_CARE_PROVIDER_SITE_OTHER): Payer: 59 | Admitting: Cardiovascular Disease

## 2016-10-12 ENCOUNTER — Encounter: Payer: Self-pay | Admitting: Cardiovascular Disease

## 2016-10-12 VITALS — BP 122/84 | HR 63 | Ht 63.5 in | Wt 261.6 lb

## 2016-10-12 DIAGNOSIS — Z7901 Long term (current) use of anticoagulants: Secondary | ICD-10-CM

## 2016-10-12 DIAGNOSIS — I480 Paroxysmal atrial fibrillation: Secondary | ICD-10-CM | POA: Diagnosis not present

## 2016-10-12 DIAGNOSIS — F32A Depression, unspecified: Secondary | ICD-10-CM

## 2016-10-12 DIAGNOSIS — F329 Major depressive disorder, single episode, unspecified: Secondary | ICD-10-CM

## 2016-10-12 DIAGNOSIS — I5042 Chronic combined systolic (congestive) and diastolic (congestive) heart failure: Secondary | ICD-10-CM

## 2016-10-12 DIAGNOSIS — I10 Essential (primary) hypertension: Secondary | ICD-10-CM | POA: Diagnosis not present

## 2016-10-12 DIAGNOSIS — N183 Chronic kidney disease, stage 3 unspecified: Secondary | ICD-10-CM

## 2016-10-12 NOTE — Patient Instructions (Signed)
Your physician wants you to follow-up in: 3-4 MONTHS WITH DR Claiborne Billings You will receive a reminder letter in the mail two months in advance. If you don't receive a letter, please call our office to schedule the follow-up appointment.

## 2016-10-12 NOTE — Progress Notes (Signed)
Patient ID: Caitlyn Fuentes, female   DOB: [birth year], 60 y.o.   MRN: [id number]     HPI: Caitlyn Fuentes is a 61 y.o. female who presents to the office today for a 9 month follow up cardiology evaluation.  Caitlyn Fuentes  initially presented with atrial fibrillation in 2008 was found to have a nonischemic cardiomyopathy. Cardiac catheterization did not show significant coronary obstructive disease and she was found to have mild pulmonary hypertension.   She has obstructive sleep apnea documented since 2008 and has been using CPAP.  She had undergone a follow-up sleep study in 2013, but never follow-up with getting a new CPAP machine.  She uses [employer] for her DME company.   Additional problems include with obesity, hypertension, mild renal insufficiency, as well as lower extremity edema.  There also is a history of lupus as well as gout.  She has lower extremity venous reflux disease with deep venous insufficiency within the left common femoral vein and right and left greater saphenous veins have demonstrated valvular insufficiency, as well as the left short saphenous vein.    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  When I saw her in July 2017, she was unaware of any recurrent atrial fibrillation.  He states that she had seen a neurologist.  She had not been using CPAP due to her machine malfunction.  He referred her for a another sleep study, which was interpreted in October 2017 by Dr. Annamaria Boots which showed severe sleep apnea with an AHI of 33.7.  AHI during REM sleep was 48.  She dropped her oxygen to 86% and there was moderate snoring.  She subsequently underwent a CPAP titration was felt that her optimal CPAP pressure was 9 cm. she had never seen Dr. Annamaria Boots and when I last saw her in April.  She requested that I follow her sleep apnea and recommended her DME company to contact her regarding a  new machine.  Since I last saw her in April 2018, she was hospitalized with volume excess and had seen Richardson Dopp prior to her admission.  There was some concern for possible stroke but this was ruled out.  A head CT was negative as was an MRI.  Repeat echo showed EF at 40-45%.  Carotid ultrasound was negative for significant ICA stenosis.  He had significant dehydration and over diuresis and her serum creatinine had increased to 2.6 and peaked at 2.8.  Rx were held and ultimately resumed at half dose at discharge.  She saw Richardson Dopp back in follow-up on 08/28/2016.  She presents to the office today and initially was very tearful and obviously depressed.  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  She also has been seen by Dr. Justin Mend for renal follow-up and her creatinine had improved to 1.7.  She denies any chest pain.  She is unaware of recurrent atrial fibrillation.  She presents for evaluation  Past Medical History:  Diagnosis Date  . A-fib ([place])   . Cardiomyopathy   . Hyperlipidemia   . Hypertension   . Lupus   . Pulmonary hypertension ([place]) 05/10/2011   Echo, EF-40-45  . Renal disorder   . Sleep apnea 2008   CPAP, pt does not know settings  . Thyroid disease     Past Surgical History:  Procedure Laterality Date  .  c sections  x 2  . ABDOMINAL HYSTERECTOMY     complete  . CARDIAC CATHETERIZATION     Nonischemic, EF-40, continued medical therapy  . CARDIAC CATHETERIZATION  10/02/2006   No significant CAD  . COLONOSCOPY WITH PROPOFOL N/A 11/22/2015   Procedure: COLONOSCOPY WITH PROPOFOL;  Surgeon: Juanita Craver, MD;  Location: [facility];  Service: Endoscopy;  Laterality: N/A;  . ESOPHAGOGASTRODUODENOSCOPY (EGD) WITH PROPOFOL N/A 11/22/2015   Procedure: ESOPHAGOGASTRODUODENOSCOPY (EGD) WITH PROPOFOL;  Surgeon: Juanita Craver, MD;  Location[facility];  Service: Endoscopy;  Laterality: N/A;    Allergies  Allergen Reactions  .  Diovan [Valsartan] Other (See Comments)    Messes up kidneys  . Lisinopril Swelling    Facial swelling  . Metolazone     Acute kidney injury in 07/2016    Current Outpatient Prescriptions  Medication Sig Dispense Refill  . allopurinol (ZYLOPRIM) 300 MG tablet Take 300 mg by mouth daily.  1  . apixaban (ELIQUIS) 5 MG TABS tablet Take 1 tablet (5 mg total) by mouth 2 (two) times daily. 180 tablet 3  . atorvastatin (LIPITOR) 40 MG tablet TAKE 1 TABLET (40 MG TOTAL) BY MOUTH DAILY. 90 tablet 0  . carvedilol (COREG) 12.5 MG tablet Take 1 tablet (12.5 mg total) by mouth 2 (two) times daily. 180 tablet 3  . colchicine 0.6 MG tablet Take 0.6 mg by mouth daily.    Marland Kitchen estradiol (ESTRACE) 2 MG tablet Take 2 mg by mouth daily.     . famotidine (PEPCID) 20 MG tablet Take 1 tablet (20 mg total) by mouth as directed. 1 tablet twice daily for 2 weeks then as needed    . levothyroxine (SYNTHROID, LEVOTHROID) 25 MCG tablet Take 25 mcg by mouth daily.  4  . omega-3 acid ethyl esters (LOVAZA) 1 g capsule Take 1 g by mouth daily.    Marland Kitchen torsemide (DEMADEX) 20 MG tablet Take 1 tablet (20 mg total) by mouth daily. TAKE 2 (TWO) TABLETS BY MOUTH TWICE DAILY 30 tablet 0  . zolpidem (AMBIEN) 10 MG tablet Take 10 mg by mouth at bedtime as needed for sleep.      No current facility-administered medications for this visit.     Social History   Social History  . Marital status: Married    Spouse name: N/A  . Number of children: N/A  . Years of education: N/A   Occupational History  . Not on file.   Social History Main Topics  . Smoking status: Former Smoker    Quit date: 12/25/2008  . Smokeless tobacco: Never Used  . Alcohol use 0.0 oz/week     Comment: once a month  . Drug use: No  . Sexual activity: Not on file   Other Topics Concern  . Not on file   Social History Narrative  . No narrative on file   Socially, she is married and has 4 children, one grandchild.  There is no tobacco or alcohol use.   She does not routinely exercise.  Family History  Problem Relation Age of Onset  . Heart disease Mother   . Heart disease Father   . Lung disease Sister   . Heart disease Brother   . Hypertension Sister     ROS General: Negative; No fevers, chills, or night sweats HEENT: Negative; No changes in vision or hearing, sinus congestion, difficulty swallowing Pulmonary: Negative; No cough, wheezing, shortness of breath, hemoptysis Cardiovascular: See HPI:  GI: Negative; No nausea, vomiting, diarrhea, or abdominal pain GU:  Negative; No dysuria, hematuria, or difficulty voiding Musculoskeletal: Negative; no myalgias, joint pain, or weakness Hematologic: History of iron deficiency ; no easy bruising, bleeding Endocrine: Negative; no heat/cold intolerance; no diabetes, Rheumatologic: Positive for history of gout Neuro: Negative; no changes in balance, headaches Skin:  Malar rash Psychiatric: Initially very tearful, frustrated, depressed and overwhelmed. Sleep:  Positive for sleep apnea; Previous CPAP machine had malfunctioned and she had not been on recent therapy. Other comprehensive 14 point system review is negative   Physical Exam BP 122/84   Pulse 63   Ht 5' 3.5" (1.613 m)   Wt 261 lb 9.6 oz (118.7 kg)   BMI 45.61 kg/m    Repeat blood pressure by me 130/84  Wt Readings from Last 3 Encounters:  10/12/16 261 lb 9.6 oz (118.7 kg)  08/28/16 260 lb 6.4 oz (118.1 kg)  08/20/16 259 lb 6.4 oz (117.7 kg)   General: Alert, oriented, no distress.  Skin: normal turgor, no rashes, warm and dry HEENT: Normocephalic, atraumatic. Pupils equal round and reactive to light; sclera anicteric; extraocular muscles intact; Nose without nasal septal hypertrophy Mouth/Parynx benign; Mallinpatti scale 3/34 Neck: No JVD, no carotid bruits; normal carotid upstroke Lungs: clear to ausculatation and percussion; no wheezing or rales Chest wall: without tenderness to palpitation Heart: PMI not  displaced, RRR, s1 s2 normal, 1/6 systolic murmur, no diastolic murmur, no rubs, gallops, thrills, or heaves Abdomen: soft, nontender; no hepatosplenomehaly, BS+; abdominal aorta nontender and not dilated by palpation. Back: no CVA tenderness Pulses 2+ Musculoskeletal: full range of motion, normal strength, no joint deformities Extremities: no clubbing cyanosis or edema, Homan's sign negative  Neurologic: grossly nonfocal; Cranial nerves grossly wnl Psychologic: Normal mood and affect  ECG (independently read by me): Normal sinus rhythm at 63 bpm.  LVH by voltage criteria.  Nonspecific ST-T changes.  Normal intervals.  April 2018 ECG (independently read by me): Normal sinus rhythm at 77 bpm.  LVH by voltage criteria.  Nonspecific ST-T changes.  QTc interval 441 Caitlyn.  July 2017 ECG (independently read by me): Sinus rhythm at 67 bpm.  Nondiagnostic T-wave changes with T-wave inversion in lead 3 and aVF.  May 2016 ECG (independently read by me): Sinus rhythm with ventricular rate at 70 bpm.  T-wave inversion in leads 3 and aVF.  QTc interval 494 Caitlyn.  ECG (independently read by me): Normal sinus rhythm at 78 beats per minute.  LVH by voltage criteria in aVL.  No significant ST changes. QTc interval 437 Caitlyn.  LABS:  BMP Latest Ref Rng & Units 08/20/2016 08/19/2016 08/18/2016  Glucose 65 - 99 mg/dL 94 102(H) 113(H)  BUN 6 - 20 mg/dL 45(H) 61(H) 69(H)  Creatinine 0.44 - 1.00 mg/dL 1.79(H) 2.01(H) 2.69(H)  Sodium 135 - 145 mmol/L 137 135 134(L)  Potassium 3.5 - 5.1 mmol/L 4.3 3.3(L) 3.8  Chloride 101 - 111 mmol/L 101 98(L) 92(L)  CO2 22 - 32 mmol/L _0 Calcium 8.9 - 10.3 mg/dL 8.9 8.9 9.5   Hepatic Function Latest Ref Rng & Units 08/18/2016 08/17/2016 08/10/2016  Total Protein 6.5 - 8.1 g/dL 7.9 8.3(H) 7.3  Albumin 3.5 - 5.0 g/dL 4.4 4.6 3.9  AST 15 - 41 U/L _1 ALT 14 - 54 U/L _2 Alk Phosphatase 38 - 126 U/L 95 103 93  Total Bilirubin 0.3 - 1.2 mg/dL 0.7 0.8 0.7   CBC  Latest Ref Rng & Units 08/20/2016 08/19/2016 08/18/2016  WBC 4.0 - 10.5  K/uL 3.4(L) 3.0(L) 3.8(L)  Hemoglobin 12.0 - 15.0 g/dL 11.7(L) 12.6 14.1  Hematocrit 36.0 - 46.0 % 33.2(L) 38.5 41.3  Platelets 150 - 400 K/uL 260 307 323    Lab Results  Component Value Date   MCV 91.7 08/20/2016   MCV 91.2 08/19/2016   MCV 90.4 08/18/2016    Lab Results  Component Value Date   TSH 2.808 03/26/2014   Lab Results  Component Value Date   HGBA1C 4.8 08/18/2016   Lipid Panel     Component Value Date/Time   CHOL 126 08/18/2016 0827   TRIG 178 (H) 08/18/2016 0827   HDL 39 (L) 08/18/2016 0827   CHOLHDL 3.2 08/18/2016 0827   VLDL 36 08/18/2016 0827   LDLCALC 51 08/18/2016 0827      RADIOLOGY: No results found.  IMPRESSION:  1. Paroxysmal atrial fibrillation (HCC)   2. Chronic combined systolic and diastolic heart failure ([place])   3. Essential hypertension   4. Long term current use of anticoagulant therapy   5. CKD (chronic kidney disease) stage 3, GFR 30-59 ml/min   6. Depression, unspecified depression type   7. Morbid obesity due to excess calories [facility])     ASSESSMENT AND PLAN: Caitlyn. Fuentes is a 61 year old morbidly obese African-American female has a history of previous nonischemic cardiomyopathy with an ejection fraction of approximately 35% initially.  Subsequent echo Doppler study in 2013 showed her ejection fraction at 40-45%.  She has been maintaining normal sinus rhythm and has not had recurrent atrial fibrillation.  She has venous insufficiency and has a history of bilateral lower extremity edema.  She did recently develop volume overload and was diuresed aggressively resulting in transient acute kidney injury with peak creatinine at 2.8 improving to 1.7.  She is maintaining sinus rhythm and has not had recurrent AF.  I reviewed her most recent hospitalization.  When she had seen Richardson Dopp at the end of April.  She became somewhat unresponsive in the waiting room and was  sent to the emergency room due to concern for acute stroke.  She did not have a stroke.  She was dehydrated.  She continues to have a cardiomyopathy with an EF of 40-45%, which is stable.  She did not have significant internal carotid artery stenoses.  Her blood pressure today is stable on her current regimen consisting of torsemide 20 mg twice a day, carvedilol 12.5 mg twice a day.  She has a history of hypothyroidism and is on low-dose levothyroxine at 25 g.  She has not had recent bleeding and is tolerating eliquis 5 mg twice a day.  She has a history of gout and continues to take allopurinol and culture seen.  She obviously is having some issues with depression, feeling overwhelmed, frustrated, significantly stressed at work, and was very tearful.  She has been on medical disability. We have contacted her primary provider, Dr. Maudie Mercury, who she had seen yesterday.  She will go back to his office today for reassessment.  I will see her in 3 months for reevaluation. Time spent: 40 minutes  Troy Sine, MD, [facility]  10/13/2016 5:34 PM

## 2016-10-17 ENCOUNTER — Other Ambulatory Visit: Payer: Self-pay | Admitting: Cardiovascular Disease

## 2016-10-30 ENCOUNTER — Ambulatory Visit
Admission: RE | Admit: 2016-10-30 | Discharge: 2016-10-30 | Disposition: A | Discharge status: Home / Self Care | Payer: 59 | Source: Ambulatory Visit | Attending: Internal Medicine | Admitting: Internal Medicine

## 2016-10-30 DIAGNOSIS — M79602 Pain in left arm: Secondary | ICD-10-CM

## 2016-12-16 ENCOUNTER — Other Ambulatory Visit: Payer: Self-pay | Admitting: Cardiovascular Disease

## 2016-12-17 NOTE — Telephone Encounter (Signed)
REFILL 

## 2016-12-19 NOTE — Progress Notes (Signed)
Caitlyn Fuentes was seen today in neurologic consultation at the request of [name], MD.  This patient is accompanied in the office by her spouse who supplements the history.   The consultation is for the evaluation of R sided weakness.   The patient reports that she woke up with R sided weakness and paresthesias.  She initially states that it was "a little over a week ago" but then corrects herself and states it may have been longer than that.  Numbness is in the face, arm and leg on the right, "like novacaine is wearing off."  She states that it feels "cold."  It has changed to just a little since it started..  She is on coumadin for a-fib.  She has been on that for 10 years, but she was off of it for a colonoscopy.  When I brought that up, she states "yes, it did start after that colonscopy."  She had that colonoscopy on 8/1 and states that her sx's started within a week of that but she cannot state exactly when.  No carotid u/s in our system.  Last echo was in 2013.  She is on lipitor, 40 mg for hyperlipidemia for 4 years.  When questioned, she states her biggest complaint is pain in the right arm.  She describes the pain as a coldness in the right arm.  States that she has to have her husband massage to the right arm because of this.  It started at the same time everything else started.  She is having soreness and pain up into the right neck and right trapezius region.  She goes to bed at 1:30-2am because she works 2nd shift.  It takes a long time to fall asleep and she gets up a few times to use the bathroom.  She gets up at 9am for the day and she is not refreshed.  She does wear CPAP but she thinks that it isn't working well and is having air leakage.  She hasn't had it checked in 10 years.  States that her body weight has increased about 40 lbs in 10 years.  Has no idea what pressure is on her machine.  States that she had an episode many years ago that she would describe as a TIA.  She states  that she had trouble with her words for a very long time.  It started after she had injections in the occiput by Dr. Gwenlyn Saran at the headache clinic.  She had an MRI done after that.  I see that she had an MRI done in 2009 for " aphasia" and it was normal.  She reports that she was ultimately seen at [facility], but does not know the diagnosis.  She did recently have a repeat MRI on 12/20/2015.  She brings the CD with her as it was done through [facility].  Diffusion-weighted imaging was negative.  There was no infarction.  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  12/21/16 update:  Pt seen in f/u for Left arm pain.  I haven't seen her for a year.  At that time, she was evaluated for right arm pain.  The records that were made available to me were reviewed.  She was in the hospital in April for R sided paresthesias.  Work up for stroke was negative.  It was felt it could represent migraine without headache (aura without headache).  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  This was reviewed.  There were L sided degenerative changes at C2-4 with L NFS.  Patient states that she has pain in the neck that radiates into the pinky on the L and "it keeps me up unless I take my pain pill every 6 hours."  States that pain pill is robaxin and causes some drowsiness and cognitive dulling.  States that she ran into the corner of the stair a few months ago and hit her proximal arm and she had a searing pain into the L hand that "stayed that way for months."  States that she has not been able to work since hospitalization in April and that has really affected her.  C/o knee pain bilaterally.   ALLERGIES:   Allergies  Allergen Reactions  . Diovan [Valsartan] Other (See Comments)    Messes up kidneys  . Lisinopril Swelling    Facial swelling  . Metolazone     Acute kidney injury in 07/2016    CURRENT MEDICATIONS:   Outpatient Encounter Prescriptions as of 12/21/2016  Medication Sig  . allopurinol (ZYLOPRIM) 300 MG tablet Take 300 mg by mouth daily.  Marland Kitchen apixaban (ELIQUIS) 5 MG TABS tablet Take 1 tablet (5 mg total) by mouth 2 (two) times daily.  Marland Kitchen atorvastatin (LIPITOR) 40 MG tablet TAKE 1 TABLET (40 MG TOTAL) BY MOUTH DAILY.  . carvedilol (COREG CR) 80 MG 24 hr capsule TAKE 1 CAPSULE (80 MG TOTAL) BY MOUTH DAILY.  Marland Kitchen colchicine 0.6 MG tablet Take 0.6 mg by mouth daily.  . Doxepin HCl 5 % CREA apply 2 grams (2 grams=2 inches) to affected area(s) 3 times daily.  . DULoxetine (CYMBALTA) 30 MG capsule Take 30 mg by mouth 2 (two) times daily.  Marland Kitchen estradiol (ESTRACE) 2 MG tablet Take 2 mg by mouth daily.   . famotidine (PEPCID) 20 MG tablet Take 1 tablet (20 mg total) by mouth as directed. 1 tablet twice daily for 2 weeks then as needed  . gabapentin (NEURONTIN) 300 MG capsule Take 300 mg by mouth 3 (three) times daily.  Marland Kitchen levothyroxine (SYNTHROID, LEVOTHROID) 25 MCG tablet Take 25 mcg by mouth daily.  . methocarbamol (ROBAXIN) 500 MG tablet   . NONFORMULARY OR COMPOUNDED ITEM Baclofen 5%, Diclofenac 3%, lidocaine 5% GEL  . omega-3 acid ethyl esters (LOVAZA) 1 g capsule Take 1 g by mouth daily.  Marland Kitchen tiZANidine (ZANAFLEX) 2 MG tablet Take 2 mg by mouth 3 (three) times daily.  Marland Kitchen torsemide (DEMADEX) 20 MG tablet Take 1 tablet (20 mg total) by mouth daily. TAKE 2 (TWO) TABLETS BY MOUTH TWICE DAILY  . zolpidem (AMBIEN) 10 MG tablet Take 10 mg by mouth at bedtime as needed for sleep.   . carvedilol (COREG) 12.5 MG tablet Take 1 tablet (12.5 mg total) by mouth 2 (two) times daily.   No facility-administered encounter medications on file as of 12/21/2016.     PAST MEDICAL HISTORY:   Past Medical History:  Diagnosis Date  . A-fib ([place])   . Cardiomyopathy   . Hyperlipidemia   . Hypertension   . Lupus   . Pulmonary hypertension ([place]) 05/10/2011   Echo, EF-40-45  . Renal disorder   . Sleep apnea 2008   CPAP, pt  does not know settings  . Thyroid disease     PAST SURGICAL HISTORY:   Past Surgical History:  Procedure Laterality Date  .  c sections  x 2  . ABDOMINAL HYSTERECTOMY  complete  . CARDIAC CATHETERIZATION     Nonischemic, EF-40, continued medical therapy  . CARDIAC CATHETERIZATION  10/02/2006   No significant CAD  . COLONOSCOPY WITH PROPOFOL N/A 11/22/2015   Procedure: COLONOSCOPY WITH PROPOFOL;  Surgeon: Juanita Craver, MD;  [facility];  Service: Endoscopy;  Laterality: N/A;  . ESOPHAGOGASTRODUODENOSCOPY (EGD) WITH PROPOFOL N/A 11/22/2015   Procedure: ESOPHAGOGASTRODUODENOSCOPY (EGD) WITH PROPOFOL;  Surgeon: Juanita Craver, MD;  [facility];  Service: Endoscopy;  Laterality: N/A;    SOCIAL HISTORY:   Social History   Social History  . Marital status: Married    Spouse name: N/A  . Number of children: N/A  . Years of education: N/A   Occupational History  . Not on file.   Social History Main Topics  . Smoking status: Former Smoker    Quit date: 12/25/2008  . Smokeless tobacco: Never Used  . Alcohol use 0.0 oz/week     Comment: once a month  . Drug use: No  . Sexual activity: Not on file   Other Topics Concern  . Not on file   Social History Narrative  . No narrative on file    FAMILY HISTORY:   Family Status  Relation Status  . Mother Deceased  . Father Alive  . Sister Alive  . Brother Deceased  . Sister Alive  . Daughter Alive  . Son Alive    ROS:  A complete 10 system review of systems was obtained and was unremarkable apart from what is mentioned above.  PHYSICAL EXAMINATION:    VITALS:   Vitals:   12/21/16 1527  BP: 114/60  Pulse: 72  SpO2: 98%  Weight: 270 lb (122.5 kg)  Height: 5' 3.5" (1.613 m)    GEN:  Normal appears female in no acute distress.  Appears stated age. HEENT:  Normocephalic, atraumatic. The mucous membranes are moist. The superficial temporal arteries are without ropiness or tenderness. Cardiovascular:  Regular rate and rhythm. Lungs: Clear to auscultation bilaterally. Neck/Heme: There are no carotid bruits noted bilaterally.  NEUROLOGICAL: Orientation:  The patient is alert and oriented x 3.   Cranial nerves: There is good facial symmetry.  Extraocular muscles are intact and visual fields are full to confrontational testing. Speech is fluent and clear. Soft palate rises symmetrically and there is no tongue deviation. Hearing is intact to conversational tone. Tone: Tone is good throughout. Sensation: Sensation is intact to light touch throughout.  Pinprick is quite variable and follows no physiologic distribution.  Reports decreased pin on every other finger of the L hand compared to the right (digit 1,3,5) and decreased pin down patches of the arm on the L that don't follow any specific dermatomal pattern.    Coordination:  The patient has no difficulty with RAM's or FNF bilaterally. Motor: Strength is 5/5 in the right upper and lower extremities.  She has diffuse give way weakness on the left, in both the upper and lower extremities.  This does greatly improved with encouragement.  (This is the exact opposite of what I found on her examination a year ago when she had right arm pain) DTR's: Deep tendon reflexes are 2-/4 at the left biceps, triceps, brachioradialis, 1/4 at the bilateral patella and achilles.  Plantar responses are downgoing bilaterally.  She refuses to let me do deep tendon reflexes in the right arm because of pain and hyperesthesia. Gait and Station: The patient is able to ambulate with her walker  Lab Results  Component Value Date  LDLCALC 51 08/18/2016     IMPRESSION/PLAN  1. Left arm pain  -Her examination was somewhat nonphysiologic today.  I reviewed her MRI of the cervical spine with her and showed her films today.  There are some degenerative changes but not sure that explains all sx's or PE findings.  We will go ahead and proceed with an EMG.  2.  Further  recommendations will follow the above testing.  Greater than 50% of the 25 minute visit spent in counseling and coordinating care.   Cc:  [name], MD

## 2016-12-21 ENCOUNTER — Encounter: Payer: Self-pay | Admitting: Neurology

## 2016-12-21 ENCOUNTER — Ambulatory Visit (INDEPENDENT_AMBULATORY_CARE_PROVIDER_SITE_OTHER): Payer: 59 | Admitting: Neurology

## 2016-12-21 VITALS — BP 114/60 | HR 72 | Ht 63.5 in | Wt 270.0 lb

## 2016-12-21 DIAGNOSIS — R202 Paresthesia of skin: Secondary | ICD-10-CM | POA: Diagnosis not present

## 2016-12-21 DIAGNOSIS — M79602 Pain in left arm: Secondary | ICD-10-CM | POA: Diagnosis not present

## 2016-12-21 NOTE — Patient Instructions (Signed)
1. We will schedule EMG

## 2016-12-25 ENCOUNTER — Telehealth: Payer: Self-pay | Admitting: Neurology

## 2016-12-25 ENCOUNTER — Telehealth: Payer: Self-pay | Admitting: Cardiovascular Disease

## 2016-12-25 NOTE — Telephone Encounter (Signed)
Patient returned your call.  Thanks!

## 2016-12-25 NOTE — Telephone Encounter (Signed)
Please have her address with PCP.  I didn't address last visit in notes so don't think that would be paid for.  In addition, since I likely won't be keeping her long term as patient, probably best to let PCP manage this.  This is, however, very important and needs to be addressed

## 2016-12-25 NOTE — Telephone Encounter (Signed)
Looks like we ordered in December 2017 and patient never followed through with CPAP. Please advise if okay to reorder.

## 2016-12-25 NOTE — Telephone Encounter (Signed)
Left message on machine for patient to call back.

## 2016-12-25 NOTE — Telephone Encounter (Signed)
Patient wants [place] to reorder the Cpap machine for her

## 2016-12-25 NOTE — Telephone Encounter (Signed)
New Message     Pt is ready to have CPAP machine ordered

## 2016-12-25 NOTE — Telephone Encounter (Signed)
Patient made aware.

## 2016-12-26 NOTE — Telephone Encounter (Signed)
Spoke to patient, request to continue using [facility].  Advised will fax orders to [facility] for new CPAP machine and supplies.     Patient aware and verbalized understanding.  Advised to contact me if she does not hear from them within 2 weeks.

## 2016-12-26 NOTE — Telephone Encounter (Signed)
Left message to call back  

## 2016-12-26 NOTE — Telephone Encounter (Signed)
Mrs.Montanye is returning a call back to [facility]. Please call . Thanks

## 2017-01-03 ENCOUNTER — Telehealth: Payer: Self-pay | Admitting: Cardiovascular Disease

## 2017-01-03 NOTE — Telephone Encounter (Signed)
Received a call from [employer] from [facility].Stated she saw patient today she has gained 4 lbs in 1 week,sob.Spoke to DOD Dr.Hochrein he advised take a extra 10 mg of torsemide daily for 2 days only.Appointment scheduled with Almyra Deforest PA 01/18/17 at 8:30 am.Advised to call sooner if needed.

## 2017-01-08 ENCOUNTER — Ambulatory Visit (INDEPENDENT_AMBULATORY_CARE_PROVIDER_SITE_OTHER): Payer: 59 | Admitting: Neurology

## 2017-01-08 ENCOUNTER — Telehealth: Payer: Self-pay | Admitting: Neurology

## 2017-01-08 DIAGNOSIS — M79602 Pain in left arm: Secondary | ICD-10-CM | POA: Diagnosis not present

## 2017-01-08 DIAGNOSIS — R202 Paresthesia of skin: Secondary | ICD-10-CM

## 2017-01-08 NOTE — Procedures (Signed)
[facility]  [address]  [place], [place] [address] Tel: [phone] Fax:  [phone] Test Date:  01/08/2017  Patient: Caitlyn Fuentes DOB: 05/23/1955 Physician: Narda Amber, DO  Sex: Female Height: 5\' 3"  Ref Phys: Alonza Bogus, D.O.  ID#: [id number] Temp: 36.9C Technician:    Patient Complaints: This is a 61 year-old female referred for evaluation of left arm pain.  NCV & EMG Findings: Extensive electrodiagnostic testing of the left upper extremity shows:  1. Left median, ulnar, and mixed palmer sensory responses are within normal limits. 2. Left median and ulnar motor responses are within normal limits. 3. There is no evidence of active or chronic motor axon loss changes affecting any of the tested muscles. Motor unit configuration and recruitment pattern is within normal limits.  Impression: This is a normal study of the left upper extremity. In particular, there is no evidence of carpal tunnel syndrome or a cervical radiculopathy.   ___________________________ Narda Amber, DO    Nerve Conduction Studies Anti Sensory Summary Table   Stim Site NR Peak (ms) Norm Peak (ms) P-T Amp (V) Norm P-T Amp  Left Median Anti Sensory (2nd Digit)  Wrist    2.6 <3.8 40.9 >10  Left Ulnar Anti Sensory (5th Digit)  Wrist    2.6 <3.2 14.8 >5   Motor Summary Table   Stim Site NR Onset (ms) Norm Onset (ms) O-P Amp (mV) Norm O-P Amp Site1 Site2 Delta-0 (ms) Dist (cm) Vel (m/s) Norm Vel (m/s)  Left Median Motor (Abd Poll Brev)  Wrist    2.3 <4.0 10.4 >5 Elbow Wrist 4.7 29.0 62 >50  Elbow    7.0  10.3         Left Ulnar Motor (Abd Dig Minimi)  Wrist    2.3 <3.1 8.4 >7 B Elbow Wrist 3.8 25.0 66 >50  B Elbow    6.1  8.2  A Elbow B Elbow 1.7 10.0 59 >50  A Elbow    7.8  7.5          Comparison Summary Table   Stim Site NR Peak (ms) Norm Peak (ms) P-T Amp (V) Site1 Site2 Delta-P (ms) Norm Delta (ms)  Left Median/Ulnar Palm Comparison (Wrist - 8cm)  Median  Palm    1.5 <2.2 20.1 Median Palm Ulnar Palm 0.0   Ulnar Palm    1.5 <2.2 12.1       EMG   Side Muscle Ins Act Fibs Psw Fasc Number Recrt Dur Dur. Amp Amp. Poly Poly. Comment  Left 1stDorInt Nml Nml Nml Nml Nml Nml Nml Nml Nml Nml Nml Nml N/A  Left Ext Indicis Nml Nml Nml Nml Nml Nml Nml Nml Nml Nml Nml Nml N/A  Left PronatorTeres Nml Nml Nml Nml Nml Nml Nml Nml Nml Nml Nml Nml N/A  Left Triceps Nml Nml Nml Nml Nml Nml Nml Nml Nml Nml Nml Nml N/A  Left Biceps Nml Nml Nml Nml Nml Nml Nml Nml Nml Nml Nml Nml N/A  Left Deltoid Nml Nml Nml Nml Nml Nml Nml Nml Nml Nml Nml Nml N/A      Waveforms:

## 2017-01-08 NOTE — Telephone Encounter (Signed)
Patient made aware of results. EMG results sent to Dr. Maudie Mercury.

## 2017-01-08 NOTE — Telephone Encounter (Signed)
-----   Message from [place], DO sent at 01/08/2017 10:34 AM EDT ----- Please let pt know that her EMG was normal.  There was no evidence of pinched nerve from neck or ulnar neuropathy or CTS resulting in arm pain.  Could be orthopedic but does not appear to be neurologic in nature.  Send copy of EMG to patients PCP.  Let patient know that there is nothing else further that I can do since doesn't appear to be nerve issue.  She does have some degenerative changes in her neck that we discussed at our visit, but don't appear to be etiology of this arm pain.

## 2017-01-18 ENCOUNTER — Encounter: Payer: Self-pay | Admitting: Physician Assistant

## 2017-01-18 ENCOUNTER — Ambulatory Visit (INDEPENDENT_AMBULATORY_CARE_PROVIDER_SITE_OTHER): Payer: 59 | Admitting: Physician Assistant

## 2017-01-18 VITALS — BP 128/82 | HR 71 | Ht 64.0 in | Wt 271.8 lb

## 2017-01-18 DIAGNOSIS — I5043 Acute on chronic combined systolic (congestive) and diastolic (congestive) heart failure: Secondary | ICD-10-CM | POA: Diagnosis not present

## 2017-01-18 DIAGNOSIS — E785 Hyperlipidemia, unspecified: Secondary | ICD-10-CM | POA: Diagnosis not present

## 2017-01-18 DIAGNOSIS — I480 Paroxysmal atrial fibrillation: Secondary | ICD-10-CM

## 2017-01-18 DIAGNOSIS — E039 Hypothyroidism, unspecified: Secondary | ICD-10-CM

## 2017-01-18 DIAGNOSIS — Z7901 Long term (current) use of anticoagulants: Secondary | ICD-10-CM | POA: Diagnosis not present

## 2017-01-18 DIAGNOSIS — I10 Essential (primary) hypertension: Secondary | ICD-10-CM

## 2017-01-18 MED ORDER — OMEGA-3-ACID ETHYL ESTERS 1 G PO CAPS: 1.0000 g | ORAL_CAPSULE | Freq: Every day | ORAL | 3 refills | Status: DC

## 2017-01-18 MED ORDER — TORSEMIDE 20 MG PO TABS: ORAL_TABLET | ORAL | 1 refills | Status: DC

## 2017-01-18 NOTE — Patient Instructions (Addendum)
Medication Instructions:  Your physician has recommended you make the following change in your medication:  1.  INCREASE the Torsemide to 2 tablets in the a.m. And 1 tablet in the p.m.   Labwork: TODAY:  BMET, PRO BNP, TSH, & CBC  1 WEEK:  BMET  Testing/Procedures: None ordered  Follow-Up: Your physician recommends that you schedule a follow-up appointment in: 2 WEEKS WITH HAO MENG, PA-C --BRING ALL OF YOUR MEDICATION BOTTLES WITH YOU TO THIS APPOINTMENT--  Any Other Special Instructions Will Be Listed Below (If Applicable). 1.  Bring all of your medication bottles with you to your next appointment 2.  Keep a diary of your weight daily and bring it with you to your next appointment.  If you need a refill on your cardiac medications before your next appointment, please call your pharmacy.

## 2017-01-18 NOTE — Progress Notes (Signed)
Cardiology Office Note    Date:  01/20/2017   ID:  Caitlyn Fuentes, DOB 08/13/1955, MRN [id number]  PCP:  Caitlyn Gravel, MD  Cardiologist:  Dr. Claiborne Billings Primary nephrologist: Dr. Justin Mend  Chief Complaint  Patient presents with  . Follow-up    seen for Dr. Claiborne Billings    History of Present Illness:  Caitlyn Fuentes is a 61 y.o. female with PMH of HTN, HLD, OSA, hypothyroidism, NICM, CKD, lupus and PAF on eliquis. She initially presented with atrial fibrillation in 2008 and found to have nonischemic cardiomyopathy. Cardiac catheterization did not show significant coronary artery disease, although she was found to have mild pulmonary hypertension. He had a low risk Myoview in 2013. She has OSA documented since 2008 and has been using CPAP. She has lower extremity venous insufficiency contributing to edema. Previous workup demonstrated venous insufficiency with in the left common femoral vein and right and left greater saphenous vein. She was seen in April in the ED for volume excess. She was given metolazone and had a follow-up later that week, however when she returned to her appointment, she became unresponsive in the waiting room and was sent to the ED emergently due to concern for acute stroke. She was admitted from 4/27-4/30, stroke was ruled out. CT of the head was negative. MRI was negative for acute CVA. Echocardiogram showed stable EF 40-45%. Carotid ultrasound was negative for significant stenosis. He had evidence of dehydration and over diuresis. It was felt she was taking too much metolazone. Serum creatinine was 2.6 on arrival, it was 1.17 at baseline. Creatinine eventually peaked at 2.8. Diuretic was held but resumed at half dose on discharge. Her hydralazine was stopped secondary to hypotension to time, this has later being restarted.  According to the patient, she has been accumulating fluids since her admission back in April. Compared to June, she has gained roughly 10 pounds. On physical  exam however most of the edema are nonpitting in nature. I will check a TSH to make sure this is not caused by any thyroid issues. She was last seen by her nephrologist Dr. Justin Mend last month. She does have some degree of orthopnea when she lay down at night, however on physical exam, her lungs clear. I will increase her torsemide from 20 mg twice a day to 40 mg a.m. and 20 mg p.m. Apparently at one point she was on 40 mg twice a day earlier this year. However given her episode of severe dehydration in April, I'm hesitant to increase diuretic to aggressively. I discussed with the patient the need for salt restriction. She will bring daily weight diary to her next office visit. I will obtain a CBC to check her hemoglobin as well. She will need a BMP to check volumes status. She will also return in 5-7 days for repeat BMP. I will see her back myself in 2 weeks.    Past Medical History:  Diagnosis Date  . A-fib ([place])   . Cardiomyopathy   . Hyperlipidemia   . Hypertension   . Lupus   . Pulmonary hypertension ([place]) 05/10/2011   Echo, EF-40-45  . Renal disorder   . Sleep apnea 2008   CPAP, pt does not know settings  . Thyroid disease     Past Surgical History:  Procedure Laterality Date  .  c sections  x 2  . ABDOMINAL HYSTERECTOMY     complete  . CARDIAC CATHETERIZATION     Nonischemic, EF-40, continued medical therapy  . CARDIAC  CATHETERIZATION  10/02/2006   No significant CAD  . COLONOSCOPY WITH PROPOFOL N/A 11/22/2015   Procedure: COLONOSCOPY WITH PROPOFOL;  Surgeon: Juanita Craver, MD;  Location: [facility];  Service: Endoscopy;  Laterality: N/A;  . ESOPHAGOGASTRODUODENOSCOPY (EGD) WITH PROPOFOL N/A 11/22/2015   Procedure: ESOPHAGOGASTRODUODENOSCOPY (EGD) WITH PROPOFOL;  Surgeon: Juanita Craver, MD;  Location: [facility] END[facility];  Service: Endoscopy;  Laterality: N/A;    Current Medications: Outpatient Medications Prior to Visit  Medication Sig Dispense Refill  . allopurinol (ZYLOPRIM) 300 MG  tablet Take 300 mg by mouth daily.  1  . apixaban (ELIQUIS) 5 MG TABS tablet Take 1 tablet (5 mg total) by mouth 2 (two) times daily. 180 tablet 3  . atorvastatin (LIPITOR) 40 MG tablet TAKE 1 TABLET (40 MG TOTAL) BY MOUTH DAILY. 90 tablet 1  . colchicine 0.6 MG tablet Take 0.6 mg by mouth daily.    . Doxepin HCl 5 % CREA apply 2 grams (2 grams=2 inches) to affected area(s) 3 times daily.  5  . DULoxetine (CYMBALTA) 30 MG capsule Take 30 mg by mouth 2 (two) times daily.    Marland Kitchen estradiol (ESTRACE) 2 MG tablet Take 2 mg by mouth daily.     . famotidine (PEPCID) 20 MG tablet Take 1 tablet (20 mg total) by mouth as directed. 1 tablet twice daily for 2 weeks then as needed    . gabapentin (NEURONTIN) 300 MG capsule Take 300 mg by mouth 3 (three) times daily.    Marland Kitchen levothyroxine (SYNTHROID, LEVOTHROID) 25 MCG tablet Take 25 mcg by mouth daily.  4  . methocarbamol (ROBAXIN) 500 MG tablet     . NONFORMULARY OR COMPOUNDED ITEM Baclofen 5%, Diclofenac 3%, lidocaine 5% GEL    . tiZANidine (ZANAFLEX) 2 MG tablet Take 2 mg by mouth 3 (three) times daily.    Marland Kitchen zolpidem (AMBIEN) 10 MG tablet Take 10 mg by mouth at bedtime as needed for sleep.     Marland Kitchen omega-3 acid ethyl esters (LOVAZA) 1 g capsule Take 1 g by mouth daily.    . carvedilol (COREG) 12.5 MG tablet Take 1 tablet (12.5 mg total) by mouth 2 (two) times daily. 180 tablet 3  . carvedilol (COREG CR) 80 MG 24 hr capsule TAKE 1 CAPSULE (80 MG TOTAL) BY MOUTH DAILY. 90 capsule 3  . torsemide (DEMADEX) 20 MG tablet Take 1 tablet (20 mg total) by mouth daily. TAKE 2 (TWO) TABLETS BY MOUTH TWICE DAILY 30 tablet 0   No facility-administered medications prior to visit.      Allergies:   Diovan [valsartan]; Lisinopril; and Metolazone   Social History   Social History  . Marital status: Married    Spouse name: N/A  . Number of children: N/A  . Years of education: N/A   Social History Main Topics  . Smoking status: Former Smoker    Quit date: 12/25/2008  .  Smokeless tobacco: Never Used  . Alcohol use 0.0 oz/week     Comment: once a month  . Drug use: No  . Sexual activity: Not Asked   Other Topics Concern  . None   Social History Narrative  . None     Family History:  The patient's family history includes Heart disease in her brother, father, and mother; Hypertension in her sister; Lung disease in her sister.   ROS:   Please see the history of present illness.    ROS All other systems reviewed and are negative.   PHYSICAL EXAM:  VS:  BP 128/82   Pulse 71   Ht 5\' 4"  (1.626 m)   Wt 271 lb 12.8 oz (123.3 kg)   SpO2 98%   BMI 46.65 kg/m    GEN: Well nourished, well developed, in no acute distress  HEENT: normal  Neck: no JVD, carotid bruits, or masses Cardiac: RRR; no murmurs, rubs, or gallops,no edema  Respiratory:  clear to auscultation bilaterally, normal work of breathing GI: soft, nontender, nondistended, + BS MS: no deformity or atrophy  Skin: warm and dry, no rash Neuro:  Alert and Oriented x 3, Strength and sensation are intact Psych: euthymic mood, full affect  Wt Readings from Last 3 Encounters:  01/18/17 271 lb 12.8 oz (123.3 kg)  12/21/16 270 lb (122.5 kg)  10/12/16 261 lb 9.6 oz (118.7 kg)      Studies/Labs Reviewed:   EKG:  EKG is not ordered today.    Recent Labs: 08/17/2016: B Natriuretic Peptide 21.0 08/18/2016: ALT 16 08/20/2016: Magnesium 2.8 01/18/2017: BUN 13; Creatinine, Ser 1.18; Hemoglobin 12.3; NT-Pro BNP 1,561; Platelets 341; Potassium 4.1; Sodium 144; TSH 5.170   Lipid Panel    Component Value Date/Time   CHOL 126 08/18/2016 0827   TRIG 178 (H) 08/18/2016 0827   HDL 39 (L) 08/18/2016 0827   CHOLHDL 3.2 08/18/2016 0827   VLDL 36 08/18/2016 0827   LDLCALC 51 08/18/2016 0827    Additional studies/ records that were reviewed today include:   Echo 08/19/2016 LV EF: 40% -   45%  ------------------------------------------------------------------- Indications:      Syncope  780.2.  ------------------------------------------------------------------- History:   Risk factors:  Non Ischemic Cardiomyopathy. Obstructive sleep apnea. Dyslipidemia.  ------------------------------------------------------------------- Study Conclusions  - Left ventricle: The cavity size was moderately dilated. Systolic   function was mildly to moderately reduced. The estimated ejection   fraction was in the range of 40% to 45%. Wall motion was normal;   there were no regional wall motion abnormalities. Doppler   parameters are consistent with abnormal left ventricular   relaxation (grade 1 diastolic dysfunction). There was no evidence   of elevated ventricular filling pressure by Doppler parameters. - Aortic valve: Trileaflet; normal thickness leaflets. There was no   regurgitation. - Aortic root: The aortic root was normal in size. - Mitral valve: Structurally normal valve. There was no   regurgitation. - Left atrium: The atrium was mildly dilated. - Right ventricle: The cavity size was normal. Wall thickness was   normal. Systolic function was normal. - Right atrium: The atrium was normal in size. - Tricuspid valve: There was no regurgitation. - Pulmonary arteries: Systolic pressure could not be accurately   estimated. - Inferior vena cava: The vessel was normal in size. - Pericardium, extracardiac: There was no pericardial effusion.  Impressions:  - LVEF 40-45%, with basal and mid inferior and inferolateral   hypokinesis.   ASSESSMENT:    1. Acute on chronic combined systolic (congestive) and diastolic (congestive) heart failure (HCC)   2. Paroxysmal atrial fibrillation ([place])   3. Essential hypertension   4. Long term current use of anticoagulant therapy   5. Hyperlipidemia, unspecified hyperlipidemia type   6. Hypothyroidism, unspecified type      PLAN:  In order of problems listed above:  Acute on chronic systolic heart failure: Most of the edema are  nonpitting in nature, will obtain TSH to check thyroid level. Otherwise given her orthopnea, we increase torsemide to 40 mg a.m. and 20 mg in p.m. We'll obtain CBC. basic  metabolic panel and BNP today. Will need one week basic metabolic panel.  PAF: On eliquis, patient is in sinus rhythm by physical exam, heart rate well-controlled  Hypertension: Blood pressure stable  Hyperlipidemia: On Lov[place], last lipid panel 5 month ago showed well-controlled total cholesterol and LDL, low HDL and high triglyceride. Continue diet and exercise.  Hypothyroidism: Check TSH    Medication Adjustments/Labs and Tests Ordered: Current medicines are reviewed at length with the patient today.  Concerns regarding medicines are outlined above.  Medication changes, Labs and Tests ordered today are listed in the Patient Instructions below. Patient Instructions  Medication Instructions:  Your physician has recommended you make the following change in your medication:  1.  INCREASE the Torsemide to 2 tablets in the a.m. And 1 tablet in the p.m.   Labwork: TODAY:  BMET, PRO BNP, TSH, & CBC  1 WEEK:  BMET  Testing/Procedures: None ordered  Follow-Up: Your physician recommends that you schedule a follow-up appointment in: 2 WEEKS WITH Jennifier Smitherman, PA-C --BRING ALL OF YOUR MEDICATION BOTTLES WITH YOU TO THIS APPOINTMENT--  Any Other Special Instructions Will Be Listed Below (If Applicable). 1.  Bring all of your medication bottles with you to your next appointment 2.  Keep a diary of your weight daily and bring it with you to your next appointment.  If you need a refill on your cardiac medications before your next appointment, please call your pharmacy.      Hilbert Corrigan, [place]  01/20/2017 9:06 AM    [address], [place], [place]  [address] Phone: [phone]; Fax: [phone]

## 2017-01-19 LAB — BASIC METABOLIC PANEL
BUN/Creatinine Ratio: 11 — ABNORMAL LOW (ref 12–28)
BUN: 13 mg/dL (ref 8–27)
CALCIUM: 9.4 mg/dL (ref 8.7–10.3)
CHLORIDE: 103 mmol/L (ref 96–106)
CO2: 23 mmol/L (ref 20–29)
Creatinine, Ser: 1.18 mg/dL — ABNORMAL HIGH (ref 0.57–1.00)
GFR calc Af Amer: 58 mL/min/{1.73_m2} — ABNORMAL LOW (ref >59)
GFR, EST NON AFRICAN AMERICAN: 50 mL/min/{1.73_m2} — AB (ref >59)
GLUCOSE: 98 mg/dL (ref 65–99)
POTASSIUM: 4.1 mmol/L (ref 3.5–5.2)
SODIUM: 144 mmol/L (ref 134–144)

## 2017-01-19 LAB — CBC
HEMATOCRIT: 35.9 % (ref 34.0–46.6)
Hemoglobin: 12.3 g/dL (ref 11.1–15.9)
MCH: 31.1 pg (ref 26.6–33.0)
MCHC: 34.3 g/dL (ref 31.5–35.7)
MCV: 91 fL (ref 79–97)
PLATELETS: 341 10*3/uL (ref 150–379)
RBC: 3.96 x10E6/uL (ref 3.77–5.28)
RDW: 13.1 % (ref 12.3–15.4)
WBC: 3.5 10*3/uL (ref 3.4–10.8)

## 2017-01-19 LAB — PRO B NATRIURETIC PEPTIDE: NT-Pro BNP: 1561 pg/mL — ABNORMAL HIGH (ref 0–287)

## 2017-01-19 LAB — TSH: TSH: 5.17 u[IU]/mL — AB (ref 0.450–4.500)

## 2017-01-20 ENCOUNTER — Encounter: Payer: Self-pay | Admitting: Physician Assistant

## 2017-01-25 ENCOUNTER — Telehealth: Payer: Self-pay | Admitting: Physician Assistant

## 2017-01-25 NOTE — Telephone Encounter (Signed)
Patient aware of results. She will have repeat BMET either today or early next week. Results routed to PCP to f/up on TSH

## 2017-01-25 NOTE — Telephone Encounter (Signed)
Notes recorded by Almyra Deforest, PA on 01/21/2017 at 5:19 PM EDT Pro BNP > 1500 consistent volume overload seen in the office. TSH high will defer to PCP for workup. Red blood cell normalized, no longer anemic. Kidney function improved to baseline, continue with previous recommendation of increased diuretic, expect repeat BMET in a few days as recommended during office visit

## 2017-01-25 NOTE — Telephone Encounter (Signed)
New message   Pt is calling about lab results.

## 2017-01-30 ENCOUNTER — Other Ambulatory Visit: Payer: Self-pay | Admitting: *Deleted

## 2017-01-30 DIAGNOSIS — Z7901 Long term (current) use of anticoagulants: Secondary | ICD-10-CM

## 2017-01-30 DIAGNOSIS — I5043 Acute on chronic combined systolic (congestive) and diastolic (congestive) heart failure: Secondary | ICD-10-CM

## 2017-01-30 DIAGNOSIS — I10 Essential (primary) hypertension: Secondary | ICD-10-CM

## 2017-01-30 DIAGNOSIS — I480 Paroxysmal atrial fibrillation: Secondary | ICD-10-CM

## 2017-01-31 LAB — BASIC METABOLIC PANEL
BUN / CREAT RATIO: 18 (ref 12–28)
BUN: 28 mg/dL — ABNORMAL HIGH (ref 8–27)
CO2: 27 mmol/L (ref 20–29)
CREATININE: 1.54 mg/dL — AB (ref 0.57–1.00)
Calcium: 8.9 mg/dL (ref 8.7–10.3)
Chloride: 101 mmol/L (ref 96–106)
GFR calc Af Amer: 42 mL/min/{1.73_m2} — ABNORMAL LOW (ref >59)
GFR, EST NON AFRICAN AMERICAN: 36 mL/min/{1.73_m2} — AB (ref >59)
GLUCOSE: 93 mg/dL (ref 65–99)
Potassium: 3.9 mmol/L (ref 3.5–5.2)
Sodium: 142 mmol/L (ref 134–144)

## 2017-02-01 ENCOUNTER — Ambulatory Visit (INDEPENDENT_AMBULATORY_CARE_PROVIDER_SITE_OTHER): Payer: 59 | Admitting: Physician Assistant

## 2017-02-01 ENCOUNTER — Encounter: Payer: Self-pay | Admitting: Physician Assistant

## 2017-02-01 VITALS — BP 136/72 | HR 72 | Ht 64.0 in | Wt 263.0 lb

## 2017-02-01 DIAGNOSIS — Z79899 Other long term (current) drug therapy: Secondary | ICD-10-CM

## 2017-02-01 DIAGNOSIS — E039 Hypothyroidism, unspecified: Secondary | ICD-10-CM

## 2017-02-01 DIAGNOSIS — I480 Paroxysmal atrial fibrillation: Secondary | ICD-10-CM

## 2017-02-01 DIAGNOSIS — E785 Hyperlipidemia, unspecified: Secondary | ICD-10-CM | POA: Diagnosis not present

## 2017-02-01 DIAGNOSIS — I10 Essential (primary) hypertension: Secondary | ICD-10-CM

## 2017-02-01 DIAGNOSIS — I5042 Chronic combined systolic (congestive) and diastolic (congestive) heart failure: Secondary | ICD-10-CM | POA: Diagnosis not present

## 2017-02-01 DIAGNOSIS — I428 Other cardiomyopathies: Secondary | ICD-10-CM | POA: Diagnosis not present

## 2017-02-01 MED ORDER — TORSEMIDE 20 MG PO TABS: 40.0000 mg | ORAL_TABLET | Freq: Every day | ORAL | 3 refills | Status: DC

## 2017-02-01 NOTE — Patient Instructions (Addendum)
Medication Instructions:   Decrease your regular torsemide dose to 40mg  daily. (See below for further instructions)  Heart Failure Prevention Plan: 1. Avoid salt 2. Limit daily fluid intake between 32 to 64 oz 3. Weigh yourself every morning, take extra 20mg  torsemide if weight increase by more than 3 lbs overnight or 5 lbs in a single week.  Labwork:   Your physician recommends that you return for lab work in: 2 weeks. You may have this drawn in our office, and you do not need an appointment. Our lab is open from 8:00 to 4:30 M-F (closed for lunch from 12:45 to 1:45). You do not need to fast for this test.   Testing/Procedures:  none  Follow-Up:  As scheduled with Dr. Claiborne Billings.   If you need a refill on your cardiac medications before your next appointment, please call your pharmacy.

## 2017-02-01 NOTE — Progress Notes (Signed)
Cardiology Office Note    Date:  02/01/2017   ID:  Caitlyn Fuentes, DOB [birth year], MRN [id number]  PCP:  [name], MD  Cardiologist:  Dr. Claiborne Billings Primary nephrologist: Dr. Justin Mend   Chief Complaint  Patient presents with  . Follow-up    2 weeks. Says that she has been sweating alot lately.     History of Present Illness:  Caitlyn Fuentes is a 61 y.o. female with PMH of HTN, HLD, OSA, hypothyroidism, NICM, CKD, lupus and PAF on eliquis. She initially presented with atrial fibrillation in 2008 and found to have nonischemic cardiomyopathy. Cardiac catheterization did not show significant coronary artery disease, although she was found to have mild pulmonary hypertension. She had a low risk Myoview in 2013. She has OSA documented since 2008 and has been using CPAP. She has lower extremity venous insufficiency contributing to edema. Previous workup demonstrated venous insufficiency within the left common femoral vein and right and left greater saphenous vein. She was seen in April in the ED for volume excess. She was given metolazone and had a follow-up later that week, however when she returned to her appointment, she became unresponsive in the waiting room and was sent to the ED emergently due to concern for acute stroke. She was admitted from 4/27-4/30, stroke was ruled out. CT of the head was negative. MRI was negative for acute CVA. Echocardiogram showed stable EF 40-45%. Carotid ultrasound was negative for significant stenosis. She had evidence of dehydration and over diuresis. It was felt she was taking too much metolazone. (she later told me that she misunderstood and was taking metolazone BID instead of daily) Serum creatinine was 2.6 on arrival, it was 1.17 at baseline. Creatinine eventually peaked at 2.8. Diuretic was held but resumed at half dose on discharge. Her hydralazine was stopped secondary to hypotension to time, this has later being restarted.  I last saw the patient on  01/10/2017, she was complaining of fluid accumulation. I increased her torsemide from 20 mg twice a day to 40 mg in a.m. and 20 mg in p.m. Although some point earlier this year, she was on 40 mg twice a day, however given her episode of severe dehydration in April, I was hesitant to increase diuretic aggressively. Her recent TSH was elevated at 5.17, will defer to primary care provider for further workup. One week repeat basic metabolic panel showed worsening creatinine concerning for dehydration, I have since recommended for her to switch back to 40 mg daily torsemide.  Patient presents today for cardiology office visit. Her weight has decreased from 269 pounds down to 261 pound on home scale. She is likely slightly dehydrated at this point, I have set her home dry weight to 263 pounds. Otherwise she denies significant chest discomfort shortness breath. She has nonpitting edema in lower extremity, but no pitting edema. Her lung is clear. She will need a two-week basic metabolic panel to make sure her renal function improved. Otherwise she is doing well from cardiology perspective. She can follow-up with Dr. Claiborne Billings in about 3 months. She has been having some hot flashes recently with occasional chill, I advised her to discuss this with her primary care provider.   Past Medical History:  Diagnosis Date  . A-fib ([place])   . Cardiomyopathy   . Hyperlipidemia   . Hypertension   . Lupus   . Pulmonary hypertension ([place]) 05/10/2011   Echo, EF-40-45  . Renal disorder   . Sleep apnea 2008   CPAP, pt does  not know settings  . Thyroid disease     Past Surgical History:  Procedure Laterality Date  .  c sections  x 2  . ABDOMINAL HYSTERECTOMY     complete  . CARDIAC CATHETERIZATION     Nonischemic, EF-40, continued medical therapy  . CARDIAC CATHETERIZATION  10/02/2006   No significant CAD  . COLONOSCOPY WITH PROPOFOL N/A 11/22/2015   Procedure: COLONOSCOPY WITH PROPOFOL;  Surgeon: Juanita Craver, MD;   Location: [facility];  Service: Endoscopy;  Laterality: N/A;  . ESOPHAGOGASTRODUODENOSCOPY (EGD) WITH PROPOFOL N/A 11/22/2015   Procedure: ESOPHAGOGASTRODUODENOSCOPY (EGD) WITH PROPOFOL;  Surgeon: Juanita Craver, MD;  Location: [facility];  Service: Endoscopy;  Laterality: N/A;    Current Medications: Outpatient Medications Prior to Visit  Medication Sig Dispense Refill  . allopurinol (ZYLOPRIM) 300 MG tablet Take 300 mg by mouth daily.  1  . apixaban (ELIQUIS) 5 MG TABS tablet Take 1 tablet (5 mg total) by mouth 2 (two) times daily. 180 tablet 3  . atorvastatin (LIPITOR) 40 MG tablet TAKE 1 TABLET (40 MG TOTAL) BY MOUTH DAILY. 90 tablet 1  . carvedilol (COREG CR) 80 MG 24 hr capsule Take 1 capsule by mouth daily.    . colchicine 0.6 MG tablet Take 0.6 mg by mouth daily.    . Doxepin HCl 5 % CREA apply 2 grams (2 grams=2 inches) to affected area(s) 3 times daily.  5  . DULoxetine (CYMBALTA) 30 MG capsule Take 30 mg by mouth 2 (two) times daily.    Marland Kitchen estradiol (ESTRACE) 2 MG tablet Take 2 mg by mouth daily.     . famotidine (PEPCID) 20 MG tablet Take 1 tablet (20 mg total) by mouth as directed. 1 tablet twice daily for 2 weeks then as needed    . gabapentin (NEURONTIN) 300 MG capsule Take 300 mg by mouth 3 (three) times daily.    . hydrALAZINE (APRESOLINE) 25 MG tablet Take 25 mg by mouth 2 (two) times daily.    Marland Kitchen levothyroxine (SYNTHROID, LEVOTHROID) 25 MCG tablet Take 25 mcg by mouth daily.  4  . methocarbamol (ROBAXIN) 500 MG tablet Take 500 mg by mouth every 6 (six) hours as needed.     . NONFORMULARY OR COMPOUNDED ITEM Baclofen 5%, Diclofenac 3%, lidocaine 5% GEL    . omega-3 acid ethyl esters (LOVAZA) 1 g capsule Take 1 capsule (1 g total) by mouth daily. 90 capsule 3  . tiZANidine (ZANAFLEX) 2 MG tablet Take 2 mg by mouth 3 (three) times daily.    Marland Kitchen zolpidem (AMBIEN) 10 MG tablet Take 10 mg by mouth at bedtime as needed for sleep.     Marland Kitchen torsemide (DEMADEX) 20 MG tablet Take 2 tablets by  mouth in the a.m. And 1 tablet by mouth in the p.m. 90 tablet 1  . carvedilol (COREG) 12.5 MG tablet Take 1 tablet (12.5 mg total) by mouth 2 (two) times daily. 180 tablet 3   No facility-administered medications prior to visit.      Allergies:   Diovan [valsartan]; Lisinopril; and Metolazone   Social History   Social History  . Marital status: Married    Spouse name: N/A  . Number of children: N/A  . Years of education: N/A   Social History Main Topics  . Smoking status: Former Smoker    Quit date: 12/25/2008  . Smokeless tobacco: Never Used  . Alcohol use 0.0 oz/week     Comment: once a month  . Drug use: No  .  Sexual activity: Not Asked   Other Topics Concern  . None   Social History Narrative  . None     Family History:  The patient's family history includes Heart disease in her brother, father, and mother; Hypertension in her sister; Lung disease in her sister.   ROS:   Please see the history of present illness.    ROS All other systems reviewed and are negative.   PHYSICAL EXAM:   VS:  BP 136/72   Pulse 72   Ht 5\' 4"  (1.626 m)   Wt 263 lb (119.3 kg)   BMI 45.14 kg/m    GEN: Well nourished, well developed, in no acute distress  HEENT: normal  Neck: no JVD, carotid bruits, or masses Cardiac: RRR; no murmurs, rubs, or gallops,no edema  Respiratory:  clear to auscultation bilaterally, normal work of breathing GI: soft, nontender, nondistended, + BS MS: no deformity or atrophy  Skin: warm and dry, no rash Neuro:  Alert and Oriented x 3, Strength and sensation are intact Psych: euthymic mood, full affect  Wt Readings from Last 3 Encounters:  02/01/17 263 lb (119.3 kg)  01/18/17 271 lb 12.8 oz (123.3 kg)  12/21/16 270 lb (122.5 kg)      Studies/Labs Reviewed:   EKG:  EKG is not ordered today.    Recent Labs: 08/17/2016: B Natriuretic Peptide 21.0 08/18/2016: ALT 16 08/20/2016: Magnesium 2.8 01/18/2017: Hemoglobin 12.3; NT-Pro BNP 1,561; Platelets 341;  TSH 5.170 01/30/2017: BUN 28; Creatinine, Ser 1.54; Potassium 3.9; Sodium 142   Lipid Panel    Component Value Date/Time   CHOL 126 08/18/2016 0827   TRIG 178 (H) 08/18/2016 0827   HDL 39 (L) 08/18/2016 0827   CHOLHDL 3.2 08/18/2016 0827   VLDL 36 08/18/2016 0827   LDLCALC 51 08/18/2016 0827    Additional studies/ records that were reviewed today include:   Echo 08/19/2016 LV EF: 40% - 45%  Study Conclusions  - Left ventricle: The cavity size was moderately dilated. Systolic function was mildly to moderately reduced. The estimated ejection fraction was in the range of 40% to 45%. Wall motion was normal; there were no regional wall motion abnormalities. Doppler parameters are consistent with abnormal left ventricular relaxation (grade 1 diastolic dysfunction). There was no evidence of elevated ventricular filling pressure by Doppler parameters. - Aortic valve: Trileaflet; normal thickness leaflets. There was no regurgitation. - Aortic root: The aortic root was normal in size. - Mitral valve: Structurally normal valve. There was no regurgitation. - Left atrium: The atrium was mildly dilated. - Right ventricle: The cavity size was normal. Wall thickness was normal. Systolic function was normal. - Right atrium: The atrium was normal in size. - Tricuspid valve: There was no regurgitation. - Pulmonary arteries: Systolic pressure could not be accurately estimated. - Inferior vena cava: The vessel was normal in size. - Pericardium, extracardiac: There was no pericardial effusion.  Impressions:  - LVEF 40-45%, with basal and mid inferior and inferolateral hypokinesis.   ASSESSMENT:    1. Chronic combined systolic and diastolic heart failure ([place])   2. Encounter for long-term (current) use of medications   3. NICM (nonischemic cardiomyopathy) ([place])   4. Essential hypertension   5. Hyperlipidemia, unspecified hyperlipidemia type   6.  Hypothyroidism, unspecified type   7. PAF (paroxysmal atrial fibrillation) ([facility])      PLAN:  In order of problems listed above:  1. Chronic combined systolic and diastolic heart failure: Euvolemic on physical exam, renal function worsened slightly  after increasing torsemide to 40 mg a.m. and 20 mg p.m., was scalp back torsemide to 40 mg daily. She will obtain a basic metabolic panel in one week.  2. Hypothyroidism: TSH obtained to make sure her swelling is not coming from hypothyroidism. Recent TSH elevated, we will defer to primary care provider.  3. Hot flushes: She is on estrogen replacement therapy after menopause, we'll defer to primary care provider to see if the dose need to be adjusted.  4. NICM: EF 40-45%. Euvolemic on exam.  5. Hypertension: Blood pressure stable  6. Hyperlipidemia: On [place]  7. PAF: On eliquis, patient is in sinus rhythm. Heart rate well-controlled.    Medication Adjustments/Labs and Tests Ordered: Current medicines are reviewed at length with the patient today.  Concerns regarding medicines are outlined above.  Medication changes, Labs and Tests ordered today are listed in the Patient Instructions below. Patient Instructions  Medication Instructions:   Decrease your regular torsemide dose to 40mg  daily. (See below for further instructions)  Heart Failure Prevention Plan: 1. Avoid salt 2. Limit daily fluid intake between 32 to 64 oz 3. Weigh yourself every morning, take extra 20mg  torsemide if weight increase by more than 3 lbs overnight or 5 lbs in a single week.  Labwork:   Your physician recommends that you return for lab work in: 2 weeks. You may have this drawn in our office, and you do not need an appointment. Our lab is open from 8:00 to 4:30 M-F (closed for lunch from 12:45 to 1:45). You do not need to fast for this test.   Testing/Procedures:  none  Follow-Up:  As scheduled with Dr. Claiborne Billings.   If you need a refill on your cardiac  medications before your next appointment, please call your pharmacy.      Hilbert Corrigan, [place]  02/01/2017 12:51 PM    [facility] [place], [place], [place]  [address] Phone: [phone]; Fax: [phone]

## 2017-03-01 ENCOUNTER — Telehealth: Payer: Self-pay | Admitting: Cardiovascular Disease

## 2017-03-01 NOTE — Telephone Encounter (Signed)
Spoke with [name], RN  She has gained 8lbs in 2 weeks. (she gained 4lbs last week). She has appointment on 11/15. She is compliant with torsemide and diet. She has shortness of breath and abdominal swelling. She woke up yesterday after using CPAP and was short of breath when she woke up.   Reviewed labs and creatinine has been increasing for the past 1.5 months approx.   Called patient. She has been taking torsemide 40mg  QAM and 20mg  QPM - she started taking PRN dose about 1 week after her 10/12 visit and has been taking this consistently since. She did not get her lab work as directed.   Routed to [place], [place]

## 2017-03-01 NOTE — Telephone Encounter (Signed)
Need BMET before she sees Dr. Claiborne Billings, would not recommend go beyond 40AM and 20 PM unless otherwise indicated by physical exam.

## 2017-03-01 NOTE — Telephone Encounter (Signed)
Reviewed situation with Caitlyn Fuentes, [place] - she will need to have BMET done prior to 11/15 appt with Dr. Claiborne Billings. She should continue torsemide 40mg  QAM and 20mg  QPM prn - no extra diuretic.   Tried to call patient. VM full MyChart message w/instructions sent to patient

## 2017-03-07 ENCOUNTER — Ambulatory Visit: Payer: 59 | Admitting: Cardiovascular Disease

## 2017-03-07 NOTE — Telephone Encounter (Signed)
Spoke with pt, she states that she is sorry for not being available, she has had a death in the family and is going out of town tomorrow and she states that she will go to the [facility] in [place] today or in the morning. [facility] address given to pt

## 2017-03-08 LAB — BASIC METABOLIC PANEL
BUN / CREAT RATIO: 13 (ref 12–28)
BUN: 16 mg/dL (ref 8–27)
CALCIUM: 8.7 mg/dL (ref 8.7–10.3)
CO2: 26 mmol/L (ref 20–29)
CREATININE: 1.19 mg/dL — AB (ref 0.57–1.00)
Chloride: 100 mmol/L (ref 96–106)
GFR calc Af Amer: 57 mL/min/{1.73_m2} — ABNORMAL LOW (ref >59)
GFR calc non Af Amer: 50 mL/min/{1.73_m2} — ABNORMAL LOW (ref >59)
GLUCOSE: 93 mg/dL (ref 65–99)
Potassium: 3.6 mmol/L (ref 3.5–5.2)
Sodium: 142 mmol/L (ref 134–144)

## 2017-03-27 ENCOUNTER — Ambulatory Visit: Payer: 59 | Admitting: Physician Assistant

## 2017-03-27 ENCOUNTER — Encounter: Payer: Self-pay | Admitting: Physician Assistant

## 2017-03-27 VITALS — BP 155/89 | HR 85 | Ht 63.5 in | Wt 281.4 lb

## 2017-03-27 DIAGNOSIS — I480 Paroxysmal atrial fibrillation: Secondary | ICD-10-CM | POA: Diagnosis not present

## 2017-03-27 DIAGNOSIS — Z79899 Other long term (current) drug therapy: Secondary | ICD-10-CM

## 2017-03-27 DIAGNOSIS — R079 Chest pain, unspecified: Secondary | ICD-10-CM | POA: Diagnosis not present

## 2017-03-27 DIAGNOSIS — E785 Hyperlipidemia, unspecified: Secondary | ICD-10-CM

## 2017-03-27 DIAGNOSIS — I10 Essential (primary) hypertension: Secondary | ICD-10-CM

## 2017-03-27 DIAGNOSIS — Z9989 Dependence on other enabling machines and devices: Secondary | ICD-10-CM | POA: Diagnosis not present

## 2017-03-27 DIAGNOSIS — I5033 Acute on chronic diastolic (congestive) heart failure: Secondary | ICD-10-CM | POA: Diagnosis not present

## 2017-03-27 DIAGNOSIS — E039 Hypothyroidism, unspecified: Secondary | ICD-10-CM | POA: Diagnosis not present

## 2017-03-27 DIAGNOSIS — G4733 Obstructive sleep apnea (adult) (pediatric): Secondary | ICD-10-CM | POA: Diagnosis not present

## 2017-03-27 MED ORDER — TORSEMIDE 20 MG PO TABS: 40.0000 mg | ORAL_TABLET | Freq: Two times a day (BID) | ORAL | 5 refills | Status: DC

## 2017-03-27 MED ORDER — ISOSORBIDE MONONITRATE ER 30 MG PO TB24: 30.0000 mg | ORAL_TABLET | Freq: Every day | ORAL | 3 refills | Status: DC

## 2017-03-27 NOTE — Progress Notes (Signed)
Cardiology Office Note    Date:  03/28/2017   ID:  Caitlyn Fuentes, DOB 1956/01/18, MRN [id number]  PCP:  [name], MD  Cardiologist:  Dr. Claiborne Billings Primary nephrologist: Dr. Justin Mend   Chief Complaint  Patient presents with  . Follow-up    complaints of lower extremity edema, abdominal swelling, worse shortness of breath, chest tightness  . cpap    got a new CPAP about 2 months ago and reports worse snoring and feeling tired when she wakes    History of Present Illness:  Caitlyn Fuentes is a 61 y.o. female  with PMH of HTN, HLD, OSA, hypothyroidism, NICM, CKD, lupusand PAF on eliquis. She initially presented with atrial fibrillation in 2008 and found to have nonischemic cardiomyopathy. Cardiac catheterization did not show significant coronary artery disease, although she was found to have mild pulmonary hypertension. She had a low risk Myoview in 2013. She has OSA documented since 2008 and has been using CPAP. She has lower extremity venous insufficiency contributing to edema. Previous workup demonstrated venous insufficiency within the left common femoral vein and right and left greater saphenous vein. She was seen in April in the ED for volume excess. She was given metolazone and had a follow-up later that week, however when she returned to her appointment, she became unresponsive in the waiting room and was sent to the ED emergently due to concern for acute stroke. She was admitted from 4/27-4/30, stroke was ruled out. CT of the head was negative. MRI was negative for acute CVA. Echocardiogram showed stable EF 40-45%. Carotid ultrasound was negative for significant stenosis. She had evidence of dehydration and over diuresis. It was felt she was taking too much metolazone. (she later told me that she misunderstood and was taking metolazone BID along with every dose of torsemide)Serum creatinine was 2.6 on arrival, it was 1.17 at baseline. Creatinine eventually peaked at 2.8. Diuretic was held  but resumed at half dose on discharge. Her hydralazine was stopped secondary to hypotension to time, this has later being restarted.  I last saw the patient on 01/10/2017, she was complaining of fluid accumulation. I increased her torsemide from 20 mg twice a day to 40 mg in a.m. and 20 mg in p.m. Although some point earlier this year, she was on 40 mg twice a day, however given her episode of severe dehydration in April, I was hesitant to increase diuretic aggressively. Her recent TSH was elevated at 5.17, will defer to primary care provider for further workup. One week repeat basic metabolic panel showed worsening creatinine concerning for dehydration, I have since recommended for her to switch back to 40 mg daily torsemide.  I saw the patient again on 02/01/2017, her home dry weight appears to be 263 pounds.  Two weeks lab work shows her renal function has improved back to normal.  Patient presents today for cardiology office visit.  For some reason she has gained close to 20 pounds.  She has swelling of her arm and the legs.  She is very depressed.  I will increase her torsemide to 40 mg twice daily.  Her lung is clear on physical exam.  She also mentions for the past 2 weeks she has been having intermittent chest pain radiating down the left shoulder.  She says she is worried she might have had a heart attack.  I have reviewed her previous cath report in 2011 which showed normal coronaries.  I recommended a 2-day Lexiscan Myoview.  Given the fact that  her blood pressure is elevated today and also her history of nonischemic cardiomyopathy, I added Imdur 30 mg daily.  She will need a [facility] basic metabolic panel and follow-up in 3-4 weeks.   Past Medical History:  Diagnosis Date  . A-fib ([place])   . Cardiomyopathy   . Hyperlipidemia   . Hypertension   . Lupus   . Pulmonary hypertension ([place]) 05/10/2011   Echo, EF-40-45  . Renal disorder   . Sleep apnea 2008   CPAP, pt does not know settings  .  Thyroid disease     Past Surgical History:  Procedure Laterality Date  .  c sections  x 2  . ABDOMINAL HYSTERECTOMY     complete  . CARDIAC CATHETERIZATION     Nonischemic, EF-40, continued medical therapy  . CARDIAC CATHETERIZATION  10/02/2006   No significant CAD  . COLONOSCOPY WITH PROPOFOL N/A 11/22/2015   Procedure: COLONOSCOPY WITH PROPOFOL;  Surgeon: Juanita Craver, MD;  Location: [facility];  Service: Endoscopy;  Laterality: N/A;  . ESOPHAGOGASTRODUODENOSCOPY (EGD) WITH PROPOFOL N/A 11/22/2015   Procedure: ESOPHAGOGASTRODUODENOSCOPY (EGD) WITH PROPOFOL;  Surgeon: Juanita Craver, MD;  Location: [facility];  Service: Endoscopy;  Laterality: N/A;    Current Medications: Outpatient Medications Prior to Visit  Medication Sig Dispense Refill  . allopurinol (ZYLOPRIM) 300 MG tablet Take 300 mg by mouth daily.  1  . apixaban (ELIQUIS) 5 MG TABS tablet Take 1 tablet (5 mg total) by mouth 2 (two) times daily. 180 tablet 3  . atorvastatin (LIPITOR) 40 MG tablet TAKE 1 TABLET (40 MG TOTAL) BY MOUTH DAILY. 90 tablet 1  . carvedilol (COREG CR) 80 MG 24 hr capsule Take 1 capsule by mouth daily.    . colchicine 0.6 MG tablet Take 0.6 mg by mouth daily.    . Doxepin HCl 5 % CREA apply 2 grams (2 grams=2 inches) to affected area(s) 3 times daily.  5  . DULoxetine (CYMBALTA) 30 MG capsule Take 30 mg by mouth 2 (two) times daily.    Marland Kitchen estradiol (ESTRACE) 2 MG tablet Take 2 mg by mouth daily.     . famotidine (PEPCID) 20 MG tablet Take 1 tablet (20 mg total) by mouth as directed. 1 tablet twice daily for 2 weeks then as needed    . gabapentin (NEURONTIN) 300 MG capsule Take 300 mg by mouth 3 (three) times daily.    . hydrALAZINE (APRESOLINE) 50 MG tablet Take 50 mg by mouth 2 (two) times daily.  6  . levothyroxine (SYNTHROID, LEVOTHROID) 50 MCG tablet Take 50 mcg by mouth daily.  3  . methocarbamol (ROBAXIN) 500 MG tablet Take 500 mg by mouth every 6 (six) hours as needed.     . NONFORMULARY OR  COMPOUNDED ITEM Baclofen 5%, Diclofenac 3%, lidocaine 5% GEL    . omega-3 acid ethyl esters (LOVAZA) 1 g capsule Take 1 capsule (1 g total) by mouth daily. 90 capsule 3  . tiZANidine (ZANAFLEX) 2 MG tablet Take 2 mg by mouth 3 (three) times daily.    Marland Kitchen zolpidem (AMBIEN) 10 MG tablet Take 10 mg by mouth at bedtime as needed for sleep.     . hydrALAZINE (APRESOLINE) 25 MG tablet Take 25 mg by mouth 2 (two) times daily.    Marland Kitchen levothyroxine (SYNTHROID, LEVOTHROID) 25 MCG tablet Take 25 mcg by mouth daily.  4  . torsemide (DEMADEX) 20 MG tablet Take 60 mg by mouth daily.    Marland Kitchen torsemide (DEMADEX) 20 MG tablet Take  2 tablets (40 mg total) by mouth daily. 60 tablet 3   No facility-administered medications prior to visit.      Allergies:   Diovan [valsartan]; Lisinopril; and Metolazone   Social History   Socioeconomic History  . Marital status: Married    Spouse name: None  . Number of children: None  . Years of education: None  . Highest education level: None  Social Needs  . Financial resource strain: None  . Food insecurity - worry: None  . Food insecurity - inability: None  . Transportation needs - medical: None  . Transportation needs - non-medical: None  Occupational History  . None  Tobacco Use  . Smoking status: Former Smoker    Last attempt to quit: 12/25/2008    Years since quitting: 8.2  . Smokeless tobacco: Never Used  Substance and Sexual Activity  . Alcohol use: Yes    Alcohol/week: 0.0 oz    Comment: once a month  . Drug use: No  . Sexual activity: None  Other Topics Concern  . None  Social History Narrative  . None     Family History:  The patient's family history includes Heart disease in her brother, father, and mother; Hypertension in her sister; Lung disease in her sister.   ROS:   Please see the history of present illness.    ROS All other systems reviewed and are negative.   PHYSICAL EXAM:   VS:  BP (!) 155/89 (BP Location: Left Arm, Patient Position:  Sitting, Cuff Size: Large)   Pulse 85   Ht 5' 3.5" (1.613 m)   Wt 281 lb 6.4 oz (127.6 kg)   BMI 49.07 kg/m    GEN: Well nourished, well developed, in no acute distress  HEENT: normal  Neck: no JVD, carotid bruits, or masses Cardiac: RRR; no murmurs, rubs, or gallops. 2+ mostly nonpitting edema  Respiratory:  clear to auscultation bilaterally, normal work of breathing GI: soft, nontender, nondistended, + BS MS: no deformity or atrophy  Skin: warm and dry, no rash Neuro:  Alert and Oriented x 3, Strength and sensation are intact Psych: euthymic mood, full affect  Wt Readings from Last 3 Encounters:  03/27/17 281 lb 6.4 oz (127.6 kg)  02/01/17 263 lb (119.3 kg)  01/18/17 271 lb 12.8 oz (123.3 kg)      Studies/Labs Reviewed:   EKG:  EKG is ordered today.  The ekg ordered today demonstrates normal sinus rhythm without significant ST-T wave changes.  Recent Labs: 08/17/2016: B Natriuretic Peptide 21.0 08/18/2016: ALT 16 08/20/2016: Magnesium 2.8 01/18/2017: Hemoglobin 12.3; NT-Pro BNP 1,561; Platelets 341; TSH 5.170 03/07/2017: BUN 16; Creatinine, Ser 1.19; Potassium 3.6; Sodium 142   Lipid Panel    Component Value Date/Time   CHOL 126 08/18/2016 0827   TRIG 178 (H) 08/18/2016 0827   HDL 39 (L) 08/18/2016 0827   CHOLHDL 3.2 08/18/2016 0827   VLDL 36 08/18/2016 0827   LDLCALC 51 08/18/2016 0827    Additional studies/ records that were reviewed today include:   Echo 08/19/2016 LV EF: 40% - 45%  Study Conclusions  - Left ventricle: The cavity size was moderately dilated. Systolic function was mildly to moderately reduced. The estimated ejection fraction was in the range of 40% to 45%. Wall motion was normal; there were no regional wall motion abnormalities. Doppler parameters are consistent with abnormal left ventricular relaxation (grade 1 diastolic dysfunction). There was no evidence of elevated ventricular filling pressure by Doppler parameters. -  Aortic valve:  Trileaflet; normal thickness leaflets. There was no regurgitation. - Aortic root: The aortic root was normal in size. - Mitral valve: Structurally normal valve. There was no regurgitation. - Left atrium: The atrium was mildly dilated. - Right ventricle: The cavity size was normal. Wall thickness was normal. Systolic function was normal. - Right atrium: The atrium was normal in size. - Tricuspid valve: There was no regurgitation. - Pulmonary arteries: Systolic pressure could not be accurately estimated. - Inferior vena cava: The vessel was normal in size. - Pericardium, extracardiac: There was no pericardial effusion.  Impressions:  - LVEF 40-45%, with basal and mid inferior and inferolateral hypokinesis.    ASSESSMENT:    1. Chest pain, unspecified type   2. Medication management   3. Essential hypertension   4. Hyperlipidemia, unspecified hyperlipidemia type   5. Hypothyroidism, unspecified type   6. OSA on CPAP   7. PAF (paroxysmal atrial fibrillation) ([place])   8. Acute on chronic diastolic heart failure ([facility])      PLAN:  In order of problems listed above:  1. Chest pain: Patient has history of nonischemic cardiomyopathy.  Cardiac catheterization in 2011 showed normal coronaries.  Plan for 2-day Myoview.  Add Imdur 30 mg daily.  2. Acute on chronic diastolic heart failure: Although some of her swelling is likely related to venous insufficiency, but does not explain the 20 pound weight gain.  I would increase torsemide to 40 mg twice daily.  Obtain basic metabolic panel in 5-7 days.  3. PAF on Eliquis: Maintaining sinus rhythm, heart rate very well controlled  4. Hypertension: Blood pressure mildly elevated.  Will add Imdur 30 mg daily.  5. Hyperlipidemia: On Lipitor 40 mg daily and Lovaza    Medication Adjustments/Labs and Tests Ordered: Current medicines are reviewed at length with the patient today.  Concerns regarding medicines are  outlined above.  Medication changes, Labs and Tests ordered today are listed in the Patient Instructions below. Patient Instructions  Medication Instructions:   START isosorbide mononitrate 30mg  daily INCREASE torsemide to 40mg  twice daily  Labwork:  Monday 12/10 - Wednesday 12/12 - BMET  Testing/Procedures:  Your physician has requested that you have a lexiscan myoview (2 day). For further information please visit [email or web address]. Please follow instruction sheet, as given.  Follow-Up:  3-4 weeks with either Isaac Laud, PA or Dr. Claiborne Billings  If you need a refill on your cardiac medications before your next appointment, please call your pharmacy.  Any Other Special Instructions Will Be Listed Below (If Applicable).       Hilbert Corrigan, [place]  03/28/2017 9:13 PM    [facility] [address], [place], [place]  [address] Phone: [phone]; Fax: [phone]

## 2017-03-27 NOTE — Patient Instructions (Signed)
Medication Instructions:   START isosorbide mononitrate 30mg  daily INCREASE torsemide to 40mg  twice daily  Labwork:  Monday 12/10 - Wednesday 12/12 - BMET  Testing/Procedures:  Your physician has requested that you have a lexiscan myoview (2 day). For further information please visit [email or web address]. Please follow instruction sheet, as given.  Follow-Up:  3-4 weeks with either Isaac Laud, PA or Dr. Claiborne Billings  If you need a refill on your cardiac medications before your next appointment, please call your pharmacy.  Any Other Special Instructions Will Be Listed Below (If Applicable).

## 2017-03-28 ENCOUNTER — Encounter: Payer: Self-pay | Admitting: Physician Assistant

## 2017-04-02 ENCOUNTER — Ambulatory Visit (HOSPITAL_COMMUNITY): Payer: 59

## 2017-04-05 ENCOUNTER — Telehealth (HOSPITAL_COMMUNITY): Payer: Self-pay

## 2017-04-05 NOTE — Telephone Encounter (Signed)
Encounter complete. 

## 2017-04-10 ENCOUNTER — Ambulatory Visit (HOSPITAL_COMMUNITY)
Admission: RE | Admit: 2017-04-10 | Discharge: 2017-04-10 | Disposition: A | Discharge status: Home / Self Care | Payer: 59 | Source: Ambulatory Visit | Attending: Cardiovascular Disease | Admitting: Cardiovascular Disease

## 2017-04-10 DIAGNOSIS — R079 Chest pain, unspecified: Secondary | ICD-10-CM | POA: Insufficient documentation

## 2017-04-10 MED ORDER — REGADENOSON 0.4 MG/5ML IV SOLN
0.4000 mg | Freq: Once | INTRAVENOUS | Status: AC
  Administered 2017-04-10: 0.4 mg via INTRAVENOUS

## 2017-04-10 MED ORDER — TECHNETIUM TC 99M TETROFOSMIN IV KIT
28.0000 | PACK | Freq: Once | INTRAVENOUS | Status: AC | PRN
  Administered 2017-04-10: 28 via INTRAVENOUS
  Filled 2017-04-10: qty 28

## 2017-04-11 ENCOUNTER — Ambulatory Visit (HOSPITAL_COMMUNITY)
Admission: RE | Admit: 2017-04-11 | Discharge: 2017-04-11 | Disposition: A | Discharge status: Home / Self Care | Payer: 59 | Source: Ambulatory Visit | Attending: Cardiology | Admitting: Cardiology

## 2017-04-11 LAB — MYOCARDIAL PERFUSION IMAGING
CHL CUP NUCLEAR SRS: 4
CHL CUP NUCLEAR SSS: 11
CSEPPHR: 101 {beats}/min
LV sys vol: 68 mL
LVDIAVOL: 123 mL (ref 46–106)
NUC STRESS TID: 0.79
Rest HR: 72 {beats}/min
SDS: 7

## 2017-04-11 MED ORDER — TECHNETIUM TC 99M TETROFOSMIN IV KIT
25.2000 | PACK | Freq: Once | INTRAVENOUS | Status: AC | PRN
  Administered 2017-04-11: 25.2 via INTRAVENOUS

## 2017-04-17 NOTE — Progress Notes (Signed)
Sorry for late reply, had to clarify reader, overall reassuring study, no significant reversible blockage.

## 2017-04-18 ENCOUNTER — Encounter: Payer: Self-pay | Admitting: *Deleted

## 2017-04-29 ENCOUNTER — Ambulatory Visit: Payer: 59 | Admitting: Cardiovascular Disease

## 2017-04-29 ENCOUNTER — Encounter: Payer: Self-pay | Admitting: Cardiovascular Disease

## 2017-04-29 VITALS — BP 124/84 | HR 84 | Ht 63.0 in | Wt 274.0 lb

## 2017-04-29 DIAGNOSIS — I10 Essential (primary) hypertension: Secondary | ICD-10-CM

## 2017-04-29 DIAGNOSIS — I480 Paroxysmal atrial fibrillation: Secondary | ICD-10-CM | POA: Diagnosis not present

## 2017-04-29 DIAGNOSIS — E039 Hypothyroidism, unspecified: Secondary | ICD-10-CM

## 2017-04-29 DIAGNOSIS — I428 Other cardiomyopathies: Secondary | ICD-10-CM

## 2017-04-29 DIAGNOSIS — G4733 Obstructive sleep apnea (adult) (pediatric): Secondary | ICD-10-CM | POA: Diagnosis not present

## 2017-04-29 DIAGNOSIS — Z9989 Dependence on other enabling machines and devices: Secondary | ICD-10-CM | POA: Diagnosis not present

## 2017-04-29 DIAGNOSIS — I5042 Chronic combined systolic (congestive) and diastolic (congestive) heart failure: Secondary | ICD-10-CM

## 2017-04-29 NOTE — Patient Instructions (Signed)
Medication Instructions:   Your physician recommends that you continue on your current medications as directed. Please refer to the Current Medication list given to you today.  Follow-Up: Your physician recommends that you schedule a follow-up appointment in: 4 months with Dr. Claiborne Billings.    If you need a refill on your cardiac medications before your next appointment, please call your pharmacy.

## 2017-04-29 NOTE — Progress Notes (Signed)
Patient ID: Caitlyn Fuentes, female   DOB: [birth year], 62 y.o.   MRN: [id number]     HPI: Caitlyn Fuentes is a 62 y.o. female who presents to the office today for a 7 month follow up cardiology evaluation.  Caitlyn Fuentes  initially presented with atrial fibrillation in 2008 was found to have a nonischemic cardiomyopathy. Cardiac catheterization did not show significant coronary obstructive disease and she was found to have mild pulmonary hypertension.   She has obstructive sleep apnea documented since 2008 and has been using CPAP.  She had undergone a follow-up sleep study in 2013, but never follow-up with getting a new CPAP machine.  She uses [employer] for her DME company.   Additional problems include with obesity, hypertension, mild renal insufficiency, as well as lower extremity edema.  There also is a history of lupus as well as gout.  She has lower extremity venous reflux disease with deep venous insufficiency within the left common femoral vein and right and left greater saphenous veins have demonstrated valvular insufficiency, as well as the left short saphenous vein.    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  When I saw her in July 2017, she was unaware of any recurrent atrial fibrillation.  He states that she had seen a neurologist.  She had not been using CPAP due to her machine malfunction.  He referred her for a another sleep study, which was interpreted in October 2017 by Dr. Annamaria Boots which showed severe sleep apnea with an AHI of 33.7.  AHI during REM sleep was 48.  She dropped her oxygen to 86% and there was moderate snoring.  She subsequently underwent a CPAP titration was felt that her optimal CPAP pressure was 9 cm. she had never seen Dr. Annamaria Boots and when I last saw her in April.  She requested that I follow her sleep apnea and recommended her DME company to contact her regarding a  new machine.  Since I last saw her in April 2018, she was hospitalized with volume excess and had seen Richardson Dopp prior to her admission.  There was some concern for possible stroke but this was ruled out.  A head CT was negative as was an MRI.  Repeat echo showed EF at 40-45%.  Carotid ultrasound was negative for significant ICA stenosis.  He had significant dehydration and over diuresis and her serum creatinine had increased to 2.6 and peaked at 2.8.  Rx were held and ultimately resumed at half dose at discharge.  She saw Richardson Dopp back in follow-up on 08/28/2016.  When I last saw her in June 2018 she was very tearful and obviously depressed.  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  She also has been seen by Dr. Justin Mend for renal follow-up and her creatinine had improved to 1.7.  She was maintaining sinus rhythm without recurrent AF.  Since I last saw her, she was seen in September, October, and December 2018 by Almyra Deforest, [facility].  She had gained over 20 pounds and had noticed increasing swelling of her arms and legs.  He increase torsemide to 40 mg twice a day.  He recommended a 2 day Lexiscan Myoview study.  Her blood pressure was elevated and with her history of a nonischemic myopathy isosorbide 30 mg was initiated.  [place] study showed an EF of 45%.  There  was a questionable inferolateral defect without associated ischemia.  Since I last saw her, she states that she has lost 15 pounds.  She is now back using CPAP therapy.  She saw Dr. Edrick Oh in follow-up.  She denies any chest pressure.  She continues to be on Coreg CR 80 mg, hydralazine 50 [place] grams twice a day, isosorbide 30 mg, and torsemide 40 mg twice a day.  She has been taking metolazone per Dr. Justin Mend on Monday was seen Friday at 2.5 mg.  She continues to be on eliquis 5 mg twice a day and is unaware of recurrent AF.  Her levothyroxine dose had been increased to 75 g.  She  presents for evaluation  Past Medical History:  Diagnosis Date  . A-fib ([place])   . Cardiomyopathy   . Hyperlipidemia   . Hypertension   . Lupus   . Pulmonary hypertension ([place]) 05/10/2011   Echo, EF-40-45  . Renal disorder   . Sleep apnea 2008   CPAP, pt does not know settings  . Thyroid disease     Past Surgical History:  Procedure Laterality Date  .  c sections  x 2  . ABDOMINAL HYSTERECTOMY     complete  . CARDIAC CATHETERIZATION     Nonischemic, EF-40, continued medical therapy  . CARDIAC CATHETERIZATION  10/02/2006   No significant CAD  . COLONOSCOPY WITH PROPOFOL N/A 11/22/2015   Procedure: COLONOSCOPY WITH PROPOFOL;  Surgeon: Juanita Craver, MD;  Location: [facility];  Service: Endoscopy;  Laterality: N/A;  . ESOPHAGOGASTRODUODENOSCOPY (EGD) WITH PROPOFOL N/A 11/22/2015   Procedure: ESOPHAGOGASTRODUODENOSCOPY (EGD) WITH PROPOFOL;  Surgeon: Juanita Craver, MD;  Location: [facility];  Service: Endoscopy;  Laterality: N/A;    Allergies  Allergen Reactions  . Diovan [Valsartan] Other (See Comments)    Messes up kidneys  . Lisinopril Swelling    Facial swelling  . Metolazone     Acute kidney injury in 07/2016    Current Outpatient Medications  Medication Sig Dispense Refill  . allopurinol (ZYLOPRIM) 300 MG tablet Take 300 mg by mouth daily.  1  . apixaban (ELIQUIS) 5 MG TABS tablet Take 1 tablet (5 mg total) by mouth 2 (two) times daily. 180 tablet 3  . atorvastatin (LIPITOR) 40 MG tablet TAKE 1 TABLET (40 MG TOTAL) BY MOUTH DAILY. 90 tablet 1  . carvedilol (COREG CR) 80 MG 24 hr capsule Take 1 capsule by mouth daily.    . colchicine 0.6 MG tablet Take 0.6 mg by mouth daily.    . Doxepin HCl 5 % CREA apply 2 grams (2 grams=2 inches) to affected area(s) 3 times daily.  5  . DULoxetine (CYMBALTA) 30 MG capsule Take 30 mg by mouth 2 (two) times daily.    Marland Kitchen estradiol (ESTRACE) 2 MG tablet Take 2 mg by mouth daily.     . famotidine (PEPCID) 20 MG tablet Take 1 tablet (20 mg  total) by mouth as directed. 1 tablet twice daily for 2 weeks then as needed    . gabapentin (NEURONTIN) 300 MG capsule Take 300 mg by mouth 3 (three) times daily.    . hydrALAZINE (APRESOLINE) 50 MG tablet Take 50 mg by mouth 2 (two) times daily.  6  . hydroxychloroquine (PLAQUENIL) 200 MG tablet Take 200 mg by mouth daily.    . isosorbide mononitrate (IMDUR) 30 MG 24 hr tablet Take 1 tablet (30 mg total) by mouth daily. 90 tablet 3  . levothyroxine (SYNTHROID, LEVOTHROID) 50 MCG tablet Take  50 mcg by mouth daily.  3  . methocarbamol (ROBAXIN) 500 MG tablet Take 500 mg by mouth every 6 (six) hours as needed.     . metolazone (ZAROXOLYN) 2.5 MG tablet Take 1 tablet by mouth as directed.  2  . NONFORMULARY OR COMPOUNDED ITEM Baclofen 5%, Diclofenac 3%, lidocaine 5% GEL    . omega-3 acid ethyl esters (LOVAZA) 1 g capsule Take 1 capsule (1 g total) by mouth daily. 90 capsule 3  . tiZANidine (ZANAFLEX) 2 MG tablet Take 2 mg by mouth 3 (three) times daily.    Marland Kitchen torsemide (DEMADEX) 20 MG tablet Take 2 tablets (40 mg total) by mouth 2 (two) times daily. 120 tablet 5  . zolpidem (AMBIEN) 10 MG tablet Take 10 mg by mouth at bedtime as needed for sleep.      No current facility-administered medications for this visit.     Social History   Socioeconomic History  . Marital status: Married    Spouse name: Not on file  . Number of children: Not on file  . Years of education: Not on file  . Highest education level: Not on file  Social Needs  . Financial resource strain: Not on file  . Food insecurity - worry: Not on file  . Food insecurity - inability: Not on file  . Transportation needs - medical: Not on file  . Transportation needs - non-medical: Not on file  Occupational History  . Not on file  Tobacco Use  . Smoking status: Former Smoker    Last attempt to quit: 12/25/2008    Years since quitting: 8.3  . Smokeless tobacco: Never Used  Substance and Sexual Activity  . Alcohol use: Yes     Alcohol/week: 0.0 oz    Comment: once a month  . Drug use: No  . Sexual activity: Not on file  Other Topics Concern  . Not on file  Social History Narrative  . Not on file   Socially, she is married and has 4 children, one grandchild.  There is no tobacco or alcohol use.  She does not routinely exercise.  Family History  Problem Relation Age of Onset  . Heart disease Mother   . Heart disease Father   . Lung disease Sister   . Heart disease Brother   . Hypertension Sister     ROS General: Negative; No fevers, chills, or night sweats HEENT: Negative; No changes in vision or hearing, sinus congestion, difficulty swallowing Pulmonary: Negative; No cough, wheezing, shortness of breath, hemoptysis Cardiovascular: See HPI:  GI: Negative; No nausea, vomiting, diarrhea, or abdominal pain GU: Negative; No dysuria, hematuria, or difficulty voiding Musculoskeletal: Negative; no myalgias, joint pain, or weakness Hematologic: History of iron deficiency ; no easy bruising, bleeding Endocrine: Negative; no heat/cold intolerance; no diabetes, Rheumatologic: Positive for history of gout Neuro: Negative; no changes in balance, headaches Skin:  Malar rash Psychiatric: Has depression, not tearful today Sleep:  Positive for sleep apnea; Previous CPAP machine had malfunctioned and she had not been on recent therapy. Other comprehensive 14 point system review is negative   Physical Exam BP 124/84   Pulse 84   Ht 5' 3"  (1.6 m)   Wt 274 lb (124.3 kg)   BMI 48.54 kg/m    Repeat blood pressure was 128/80  Wt Readings from Last 3 Encounters:  04/29/17 274 lb (124.3 kg)  04/10/17 281 lb (127.5 kg)  03/27/17 281 lb 6.4 oz (127.6 kg)   General: Alert, oriented, no  distress.  Skin: normal turgor, no rashes, warm and dry HEENT: Normocephalic, atraumatic. Pupils equal round and reactive to light; sclera anicteric; extraocular muscles intact;  Nose without nasal septal hypertrophy Mouth/Parynx  benign; Mallinpatti scale 3/4 Neck: No JVD, no carotid bruits; normal carotid upstroke Lungs: clear to ausculatation and percussion; no wheezing or rales Chest wall: without tenderness to palpitation Heart: PMI not displaced, RRR, s1 s2 normal, 1/6 systolic murmur, no diastolic murmur, no rubs, gallops, thrills, or heaves Abdomen: soft, nontender; no hepatosplenomehaly, BS+; abdominal aorta nontender and not dilated by palpation. Back: no CVA tenderness Pulses 2+ Musculoskeletal: full range of motion, normal strength, no joint deformities Extremities: Trace ankle edema ; no clubbing, cyanosis, Homan's sign negative  Neurologic: grossly nonfocal; Cranial nerves grossly wnl Psychologic: Normal mood and affect    ECG (independently read by me): Normal sinus rhythm at 84 bpm.  LVH with repolarization changes.  QTc interval 477 Caitlyn.  June 2018 ECG (independently read by me): Normal sinus rhythm at 63 bpm.  LVH by voltage criteria.  Nonspecific ST-T changes.  Normal intervals.  April 2018 ECG (independently read by me): Normal sinus rhythm at 77 bpm.  LVH by voltage criteria.  Nonspecific ST-T changes.  QTc interval 441 Caitlyn.  July 2017 ECG (independently read by me): Sinus rhythm at 67 bpm.  Nondiagnostic T-wave changes with T-wave inversion in lead 3 and aVF.  May 2016 ECG (independently read by me): Sinus rhythm with ventricular rate at 70 bpm.  T-wave inversion in leads 3 and aVF.  QTc interval 494 Caitlyn.  ECG (independently read by me): Normal sinus rhythm at 78 beats per minute.  LVH by voltage criteria in aVL.  No significant ST changes. QTc interval 437 Caitlyn.  LABS:  BMP Latest Ref Rng & Units 03/07/2017 01/30/2017 01/18/2017  Glucose 65 - 99 mg/dL 93 93 98  BUN 8 - 27 mg/dL 16 28(H) 13  Creatinine 0.57 - 1.00 mg/dL 1.19(H) 1.54(H) 1.18(H)  BUN/Creat Ratio 12 - 28 13 18  11(L)  Sodium 134 - 144 mmol/L 142 142 144  Potassium 3.5 - 5.2 mmol/L 3.6 3.9 4.1  Chloride 96 - 106 mmol/L 100 101  103  CO2 20 - 29 mmol/L 26 27 23   Calcium 8.7 - 10.3 mg/dL 8.7 8.9 9.4   Hepatic Function Latest Ref Rng & Units 08/18/2016 08/17/2016 08/10/2016  Total Protein 6.5 - 8.1 g/dL 7.9 8.3(H) 7.3  Albumin 3.5 - 5.0 g/dL 4.4 4.6 3.9  AST 15 - 41 U/L 25 25 21   ALT 14 - 54 U/L 16 19 18   Alk Phosphatase 38 - 126 U/L 95 103 93  Total Bilirubin 0.3 - 1.2 mg/dL 0.7 0.8 0.7   CBC Latest Ref Rng & Units 01/18/2017 08/20/2016 08/19/2016  WBC 3.4 - 10.8 x10E3/uL 3.5 3.4(L) 3.0(L)  Hemoglobin 11.1 - 15.9 g/dL 12.3 11.7(L) 12.6  Hematocrit 34.0 - 46.6 % 35.9 33.2(L) 38.5  Platelets 150 - 379 x10E3/uL 341 260 307    Lab Results  Component Value Date   MCV 91 01/18/2017   MCV 91.7 08/20/2016   MCV 91.2 08/19/2016    Lab Results  Component Value Date   TSH 5.170 (H) 01/18/2017   Lab Results  Component Value Date   HGBA1C 4.8 08/18/2016   Lipid Panel     Component Value Date/Time   CHOL 126 08/18/2016 0827   TRIG 178 (H) 08/18/2016 0827   HDL 39 (L) 08/18/2016 0827   CHOLHDL 3.2 08/18/2016 0827   VLDL  36 08/18/2016 0827   LDLCALC 51 08/18/2016 0827      RADIOLOGY: No results found.  IMPRESSION:  1. NICM (nonischemic cardiomyopathy) ([place])   2. Essential hypertension   3. OSA on CPAP   4. PAF (paroxysmal atrial fibrillation) ([place])   5. Hypothyroidism, unspecified type   6. Chronic combined systolic and diastolic heart failure ([facility])     ASSESSMENT AND PLAN: Caitlyn. Keach is a 62 year old morbidly obese African-American female has a history of previous nonischemic cardiomyopathy with an ejection fraction of approximately 35% initially.  Subsequent echo Doppler study in 2013 showed her ejection fraction at 40-45%.  She has been maintaining normal sinus rhythm and has not had recurrent atrial fibrillation.  She has venous insufficiency and has a history of bilateral lower extremity edema.  She did recently develop volume overload and was diuresed aggressively resulting in transient acute  kidney injury with peak creatinine at 2.8 improving to 1.7.  She is maintaining sinus rhythm and has not had recurrent AF.  Since I last seen her, she had gained over 20 pounds and required increasing diuretic regimen for volume overload.  She is followed by Dr. Justin Mend of nephrology.  Her most recent nuclear perfusion study did not reveal any evidence for ischemia.  Immediate defect was noted in the inferior inferolateral region which may be contributed by her body habitus, obesity, and diaphragmatic attenuation.  She has not had any recurrent chest pain and is tolerating the initiation of isosorbide 30 mg daily with improvement.  She continues to be on carvedilol long-acting 80 mg in addition to her torsemide and low-dose metolazone 2.5 mg on Monday, Wednesday and Friday.  She is now using CPAP and is sleeping better and has more energy.  She has a history of discoid lupus. .  She is now on increase levothyroxine regimen at 75 g.  Dr. [name] is her primary care doctor, who will also be rechecking laboratory.  She is on atorvastatin 40 mg for hyperlipidemia.  In April 2018 LDL was 51. .  I will see her in 4-6 months for follow-up evaluation.  She was here in the office today with her son.  I  extensively reviewed her history and treatment plan.  Time spent: 25 minutes  Troy Sine, MD, [facility]  05/06/2017 7:09 PM

## 2017-04-30 ENCOUNTER — Telehealth: Payer: Self-pay | Admitting: Cardiovascular Disease

## 2017-04-30 NOTE — Telephone Encounter (Signed)
Called patient and LVM to call back to schedule 4 month followup.

## 2017-05-06 ENCOUNTER — Encounter: Payer: Self-pay | Admitting: Cardiovascular Disease

## 2017-05-30 ENCOUNTER — Telehealth: Payer: Self-pay | Admitting: *Deleted

## 2017-05-30 NOTE — Telephone Encounter (Signed)
Received request for Medical records from [place], forwarded to [place] for email/scan/SLS 02/07

## 2017-06-30 ENCOUNTER — Other Ambulatory Visit: Payer: Self-pay | Admitting: Cardiovascular Disease

## 2017-07-01 NOTE — Telephone Encounter (Signed)
REFILL 

## 2017-08-07 ENCOUNTER — Other Ambulatory Visit: Payer: Self-pay | Admitting: Cardiovascular Disease

## 2017-08-29 ENCOUNTER — Ambulatory Visit: Payer: 59 | Admitting: Cardiovascular Disease

## 2017-09-25 ENCOUNTER — Other Ambulatory Visit: Payer: Self-pay | Admitting: Physician Assistant

## 2017-10-13 ENCOUNTER — Other Ambulatory Visit: Payer: Self-pay | Admitting: Cardiovascular Disease

## 2017-11-24 ENCOUNTER — Encounter: Payer: Self-pay | Admitting: Cardiovascular Disease

## 2017-12-29 ENCOUNTER — Other Ambulatory Visit: Payer: Self-pay | Admitting: Physician Assistant

## 2018-01-04 ENCOUNTER — Other Ambulatory Visit: Payer: Self-pay | Admitting: Cardiovascular Disease

## 2018-01-27 ENCOUNTER — Other Ambulatory Visit: Payer: Self-pay | Admitting: Cardiovascular Disease

## 2018-02-02 ENCOUNTER — Other Ambulatory Visit: Payer: Self-pay | Admitting: Cardiovascular Disease

## 2018-02-17 ENCOUNTER — Encounter: Payer: Self-pay | Admitting: Cardiovascular Disease

## 2018-02-17 ENCOUNTER — Ambulatory Visit: Payer: 59 | Admitting: Cardiovascular Disease

## 2018-02-17 VITALS — BP 120/60 | HR 80 | Ht 63.0 in | Wt 282.6 lb

## 2018-02-17 DIAGNOSIS — E039 Hypothyroidism, unspecified: Secondary | ICD-10-CM

## 2018-02-17 DIAGNOSIS — I480 Paroxysmal atrial fibrillation: Secondary | ICD-10-CM | POA: Diagnosis not present

## 2018-02-17 DIAGNOSIS — G4733 Obstructive sleep apnea (adult) (pediatric): Secondary | ICD-10-CM | POA: Diagnosis not present

## 2018-02-17 DIAGNOSIS — I428 Other cardiomyopathies: Secondary | ICD-10-CM | POA: Diagnosis not present

## 2018-02-17 DIAGNOSIS — F329 Major depressive disorder, single episode, unspecified: Secondary | ICD-10-CM

## 2018-02-17 DIAGNOSIS — Z9989 Dependence on other enabling machines and devices: Secondary | ICD-10-CM

## 2018-02-17 DIAGNOSIS — N183 Chronic kidney disease, stage 3 unspecified: Secondary | ICD-10-CM

## 2018-02-17 DIAGNOSIS — F32A Depression, unspecified: Secondary | ICD-10-CM

## 2018-02-17 DIAGNOSIS — Z7901 Long term (current) use of anticoagulants: Secondary | ICD-10-CM

## 2018-02-17 NOTE — Patient Instructions (Signed)
Medication Instructions:  Your physician recommends that you continue on your current medications as directed. Please refer to the Current Medication list given to you today.  If you need a refill on your cardiac medications before your next appointment, please call your pharmacy.   Testing/Procedures: Your physician has requested that you have an echocardiogram in 6 months. Echocardiography is a painless test that uses sound waves to create images of your heart. It provides your doctor with information about the size and shape of your heart and how well your heart's chambers and valves are working. This procedure takes approximately one hour. There are no restrictions for this procedure.  This will be done at our [facility] location:  [place]: At [facility], you and your health needs are our priority.  As part of our continuing mission to provide you with exceptional heart care, we have created designated Provider Care Teams.  These Care Teams include your primary Cardiologist (physician) and Advanced Practice Providers (APPs -  Physician Assistants and Nurse Practitioners) who all work together to provide you with the care you need, when you need it. You will need a follow up appointment in 6 months.  Please call our office 2 months in advance to schedule this appointment.  You may see Shelva Majestic, MD or one of the following Advanced Practice Providers on your designated Care Team: [place], [place] . Fabian Sharp, PA-C

## 2018-02-17 NOTE — Progress Notes (Signed)
Patient ID: Caitlyn Fuentes, female   DOB: 1955-11-07, 62 y.o.   MRN: [id number]     HPI: Caitlyn Fuentes is a 62 y.o. female who presents to the office today for a 9 month follow up cardiology evaluation.  Caitlyn Fuentes  initially presented with atrial fibrillation in 2008 was found to have a nonischemic cardiomyopathy. Cardiac catheterization did not show significant coronary obstructive disease and she was found to have mild pulmonary hypertension.   She has obstructive sleep apnea documented since 2008 and has been using CPAP.  She had undergone a follow-up sleep study in 2013, but never follow-up with getting a new CPAP machine.  She uses [employer] for her DME company.   Additional problems include with obesity, hypertension, mild renal insufficiency, as well as lower extremity edema.  There also is a history of lupus as well as gout.  She has lower extremity venous reflux disease with deep venous insufficiency within the left common femoral vein and right and left greater saphenous veins have demonstrated valvular insufficiency, as well as the left short saphenous vein.    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  When I saw her in July 2017, she was unaware of any recurrent atrial fibrillation.  He states that she had seen a neurologist.  She had not been using CPAP due to her machine malfunction.  He referred her for a another sleep study, which was interpreted in October 2017 by Dr. Annamaria Boots which showed severe sleep apnea with an AHI of 33.7.  AHI during REM sleep was 48.  She dropped her oxygen to 86% and there was moderate snoring.  She subsequently underwent a CPAP titration was felt that her optimal CPAP pressure was 9 cm. she had never seen Dr. Annamaria Boots and when I last saw her in April.  She requested that I follow her sleep apnea and recommended her DME company to contact her regarding a  new machine.  Since I last saw her in April 2018, she was hospitalized with volume excess and had seen Richardson Dopp prior to her admission.  There was some concern for possible stroke but this was ruled out.  A head CT was negative as was an MRI.  Repeat echo showed EF at 40-45%.  Carotid ultrasound was negative for significant ICA stenosis.  He had significant dehydration and over diuresis and her serum creatinine had increased to 2.6 and peaked at 2.8.  Rx were held and ultimately resumed at half dose at discharge.  She saw Richardson Dopp back in follow-up on 08/28/2016.  When I last saw her in June 2018 she was very tearful and obviously depressed.  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  She also has been seen by Dr. Justin Mend for renal follow-up and her creatinine had improved to 1.7.  She was maintaining sinus rhythm without recurrent AF.  Since I last saw her, she was seen in September, October, and December 2018 by Almyra Deforest, [facility].  She had gained over 20 pounds and had noticed increasing swelling of her arms and legs.  He increase torsemide to 40 mg twice a day.  He recommended a 2 day Lexiscan Myoview study.  Her blood pressure was elevated and with her history of a nonischemic myopathy isosorbide 30 mg was initiated.  [place] study showed an EF of 45%.  There  was a questionable inferolateral defect without associated ischemia.  I last saw her in January 2019 she had lost lost 15 pounds.  She wasback using CPAP therapy.  She saw Dr. Edrick Oh in follow-up.  She denied any chest pain continue to be on Coreg CR 80 mg, hydralazine 50 mg twice a day, isosorbide 30 mg and torsemide 40 mg daily.  She was taking metolazone 1 day/week and was on Eliquis.  She was maintaining sinus rhythm.   Since I last saw her, she has had difficulty with osteoarthritis particularly involving her pelvis hands and knees.  She has been under pain management with Dr.  Tedd Sias.  She admits to occasional swelling and has been taking metolazone 3 times per week.  She has been unaware of any abnormal rhythm.  She has been using CPAP but her husband states she has had instances where she continues to snore on her current pressure.  He denies any chest pain.  She presents for reevaluation.  Past Medical History:  Diagnosis Date  . A-fib ([place])   . Cardiomyopathy   . Hyperlipidemia   . Hypertension   . Lupus ([place])   . Pulmonary hypertension ([place]) 05/10/2011   Echo, EF-40-45  . Renal disorder   . Sleep apnea 2008   CPAP, pt does not know settings  . Thyroid disease     Past Surgical History:  Procedure Laterality Date  .  c sections  x 2  . ABDOMINAL HYSTERECTOMY     complete  . CARDIAC CATHETERIZATION     Nonischemic, EF-40, continued medical therapy  . CARDIAC CATHETERIZATION  10/02/2006   No significant CAD  . COLONOSCOPY WITH PROPOFOL N/A 11/22/2015   Procedure: COLONOSCOPY WITH PROPOFOL;  Surgeon: Juanita Craver, MD;  Location: [facility];  Service: Endoscopy;  Laterality: N/A;  . ESOPHAGOGASTRODUODENOSCOPY (EGD) WITH PROPOFOL N/A 11/22/2015   Procedure: ESOPHAGOGASTRODUODENOSCOPY (EGD) WITH PROPOFOL;  Surgeon: Juanita Craver, MD;  Location: [facility];  Service: Endoscopy;  Laterality: N/A;    Allergies  Allergen Reactions  . Diovan [Valsartan] Other (See Comments)    Messes up kidneys  . Lisinopril Swelling    Facial swelling  . Metolazone     Acute kidney injury in 07/2016    Current Outpatient Medications  Medication Sig Dispense Refill  . allopurinol (ZYLOPRIM) 300 MG tablet Take 300 mg by mouth daily.  1  . atorvastatin (LIPITOR) 40 MG tablet TAKE 1 TABLET (40 MG TOTAL) BY MOUTH DAILY. 90 tablet 3  . Buprenorphine HCl (BELBUCA) 150 MCG FILM Place inside cheek.    . carvedilol (COREG CR) 80 MG 24 hr capsule Take 1 capsule by mouth daily.    . colchicine 0.6 MG tablet Take 0.6 mg by mouth daily.    . diclofenac sodium (VOLTAREN) 1 % GEL  Apply topically 4 (four) times daily.    . Doxepin HCl 5 % CREA apply 2 grams (2 grams=2 inches) to affected area(s) 3 times daily.  5  . DULoxetine (CYMBALTA) 30 MG capsule Take 30 mg by mouth 2 (two) times daily.    Marland Kitchen ELIQUIS 5 MG TABS tablet TAKE 1 TABLET (5 MG TOTAL) BY MOUTH 2 (TWO) TIMES DAILY. 180 tablet 1  . estradiol (ESTRACE) 2 MG tablet Take 2 mg by mouth daily.     . famotidine (PEPCID) 20 MG tablet Take 1 tablet (20 mg total) by mouth as directed. 1 tablet twice daily for 2 weeks then as needed    . febuxostat (ULORIC) 40  MG tablet Take 40 mg by mouth daily.    Marland Kitchen gabapentin (NEURONTIN) 300 MG capsule Take 300 mg by mouth 3 (three) times daily.    Marland Kitchen HYDROcodone-acetaminophen (NORCO/VICODIN) 5-325 MG tablet Take 1 tablet by mouth every 6 (six) hours as needed for moderate pain.    . hydroxychloroquine (PLAQUENIL) 200 MG tablet Take 200 mg by mouth daily.    . hydrOXYzine (ATARAX/VISTARIL) 10 MG tablet Take 10 mg by mouth 3 (three) times daily as needed.    Marland Kitchen levothyroxine (SYNTHROID, LEVOTHROID) 50 MCG tablet Take 50 mcg by mouth daily.  3  . metolazone (ZAROXOLYN) 2.5 MG tablet Take 1 tablet by mouth as directed.  2  . NONFORMULARY OR COMPOUNDED ITEM Baclofen 5%, Diclofenac 3%, lidocaine 5% GEL    . omega-3 acid ethyl esters (LOVAZA) 1 g capsule Take 1 capsule (1 g total) by mouth daily. 90 capsule 3  . tiZANidine (ZANAFLEX) 2 MG tablet Take 2 mg by mouth 3 (three) times daily.    Marland Kitchen torsemide (DEMADEX) 20 MG tablet TAKE 2 TABLETS (40 MG TOTAL) BY MOUTH 2 (TWO) TIMES DAILY. 360 tablet 4  . vortioxetine HBr (TRINTELLIX) 10 MG TABS tablet Take 10 mg by mouth daily.    Marland Kitchen zolpidem (AMBIEN) 10 MG tablet Take 10 mg by mouth at bedtime as needed for sleep.     . isosorbide mononitrate (IMDUR) 30 MG 24 hr tablet Take 1 tablet (30 mg total) by mouth daily. 90 tablet 3  . methocarbamol (ROBAXIN) 500 MG tablet Take 500 mg by mouth every 6 (six) hours as needed.      No current  facility-administered medications for this visit.     Social History   Socioeconomic History  . Marital status: Married    Spouse name: Not on file  . Number of children: Not on file  . Years of education: Not on file  . Highest education level: Not on file  Occupational History  . Not on file  Social Needs  . Financial resource strain: Not on file  . Food insecurity:    Worry: Not on file    Inability: Not on file  . Transportation needs:    Medical: Not on file    Non-medical: Not on file  Tobacco Use  . Smoking status: Former Smoker    Last attempt to quit: 12/25/2008    Years since quitting: 9.1  . Smokeless tobacco: Never Used  Substance and Sexual Activity  . Alcohol use: Yes    Alcohol/week: 0.0 standard drinks    Comment: once a month  . Drug use: No  . Sexual activity: Not on file  Lifestyle  . Physical activity:    Days per week: Not on file    Minutes per session: Not on file  . Stress: Not on file  Relationships  . Social connections:    Talks on phone: Not on file    Gets together: Not on file    Attends religious service: Not on file    Active member of club or organization: Not on file    Attends meetings of clubs or organizations: Not on file    Relationship status: Not on file  . Intimate partner violence:    Fear of current or ex partner: Not on file    Emotionally abused: Not on file    Physically abused: Not on file    Forced sexual activity: Not on file  Other Topics Concern  . Not on file  Social History Narrative  . Not on file   Socially, she is married and has 4 children, one grandchild.  There is no tobacco or alcohol use.  She does not routinely exercise.  Family History  Problem Relation Age of Onset  . Heart disease Mother   . Heart disease Father   . Lung disease Sister   . Heart disease Brother   . Hypertension Sister     ROS General: Negative; No fevers, chills, or night sweats HEENT: Negative; No changes in vision or  hearing, sinus congestion, difficulty swallowing Pulmonary: Negative; No cough, wheezing, shortness of breath, hemoptysis Cardiovascular: See HPI:  GI: Negative; No nausea, vomiting, diarrhea, or abdominal pain GU: Negative; No dysuria, hematuria, or difficulty voiding Musculoskeletal: Negative; no myalgias, joint pain, or weakness Hematologic: History of iron deficiency ; no easy bruising, bleeding Endocrine: Negative; no heat/cold intolerance; no diabetes, Rheumatologic: Positive for history of gout Neuro: Negative; no changes in balance, headaches Skin:  Malar rash Psychiatric: Has depression, not tearful today Sleep:  Positive for sleep apnea; Previous CPAP machine had malfunctioned and she had not been on recent therapy. Other comprehensive 14 point system review is negative   Physical Exam BP 120/60   Pulse 80   Ht 5' 3"  (1.6 m)   Wt 282 lb 9.6 oz (128.2 kg)   BMI 50.06 kg/m    Repeat blood pressure by me was 130/84  Wt Readings from Last 3 Encounters:  02/17/18 282 lb 9.6 oz (128.2 kg)  04/29/17 274 lb (124.3 kg)  04/10/17 281 lb (127.5 kg)   General: Alert, oriented, no distress.  Skin: normal turgor, no rashes, warm and dry HEENT: Normocephalic, atraumatic. Pupils equal round and reactive to light; sclera anicteric; extraocular muscles intact; Nose without nasal septal hypertrophy Mouth/Parynx benign; Mallinpatti scale 3 Neck: No JVD, no carotid bruits; normal carotid upstroke Lungs: clear to ausculatation and percussion; no wheezing or rales Chest wall: without tenderness to palpitation Heart: PMI not displaced, RRR, s1 s2 normal, 1/6 systolic murmur, no diastolic murmur, no rubs, gallops, thrills, or heaves Abdomen: soft, nontender; no hepatosplenomehaly, BS+; abdominal aorta nontender and not dilated by palpation. Back: no CVA tenderness Pulses 2+ Musculoskeletal: full range of motion, normal strength, no joint deformities Extremities: no clubbing cyanosis or  edema, Homan's sign negative  Neurologic: grossly nonfocal; Cranial nerves grossly wnl Psychologic: Normal mood and affect   ECG (independently read by me): Normal sinus rhythm 80 bpm.  Nonspecific ST changes.  April 29, 2017 ECG (independently read by me): Normal sinus rhythm at 84 bpm.  LVH with repolarization changes.  QTc interval 477 Caitlyn.  June 2018 ECG (independently read by me): Normal sinus rhythm at 63 bpm.  LVH by voltage criteria.  Nonspecific ST-T changes.  Normal intervals.  April 2018 ECG (independently read by me): Normal sinus rhythm at 77 bpm.  LVH by voltage criteria.  Nonspecific ST-T changes.  QTc interval 441 Caitlyn.  July 2017 ECG (independently read by me): Sinus rhythm at 67 bpm.  Nondiagnostic T-wave changes with T-wave inversion in lead 3 and aVF.  May 2016 ECG (independently read by me): Sinus rhythm with ventricular rate at 70 bpm.  T-wave inversion in leads 3 and aVF.  QTc interval 494 Caitlyn.  ECG (independently read by me): Normal sinus rhythm at 78 beats per minute.  LVH by voltage criteria in aVL.  No significant ST changes. QTc interval 437 Caitlyn.  LABS:  BMP Latest Ref Rng & Units 03/07/2017 01/30/2017 01/18/2017  Glucose 65 - 99 mg/dL 93 93 98  BUN 8 - 27 mg/dL 16 28(H) 13  Creatinine 0.57 - 1.00 mg/dL 1.19(H) 1.54(H) 1.18(H)  BUN/Creat Ratio 12 - 28 13 18  11(L)  Sodium 134 - 144 mmol/L 142 142 144  Potassium 3.5 - 5.2 mmol/L 3.6 3.9 4.1  Chloride 96 - 106 mmol/L 100 101 103  CO2 20 - 29 mmol/L 26 27 23   Calcium 8.7 - 10.3 mg/dL 8.7 8.9 9.4   Hepatic Function Latest Ref Rng & Units 08/18/2016 08/17/2016 08/10/2016  Total Protein 6.5 - 8.1 g/dL 7.9 8.3(H) 7.3  Albumin 3.5 - 5.0 g/dL 4.4 4.6 3.9  AST 15 - 41 U/L 25 25 21   ALT 14 - 54 U/L 16 19 18   Alk Phosphatase 38 - 126 U/L 95 103 93  Total Bilirubin 0.3 - 1.2 mg/dL 0.7 0.8 0.7   CBC Latest Ref Rng & Units 01/18/2017 08/20/2016 08/19/2016  WBC 3.4 - 10.8 x10E3/uL 3.5 3.4(L) 3.0(L)  Hemoglobin 11.1 - 15.9  g/dL 12.3 11.7(L) 12.6  Hematocrit 34.0 - 46.6 % 35.9 33.2(L) 38.5  Platelets 150 - 379 x10E3/uL 341 260 307    Lab Results  Component Value Date   MCV 91 01/18/2017   MCV 91.7 08/20/2016   MCV 91.2 08/19/2016    Lab Results  Component Value Date   TSH 5.170 (H) 01/18/2017   Lab Results  Component Value Date   HGBA1C 4.8 08/18/2016   Lipid Panel     Component Value Date/Time   CHOL 126 08/18/2016 0827   TRIG 178 (H) 08/18/2016 0827   HDL 39 (L) 08/18/2016 0827   CHOLHDL 3.2 08/18/2016 0827   VLDL 36 08/18/2016 0827   LDLCALC 51 08/18/2016 0827      RADIOLOGY: No results found.  IMPRESSION:  1. NICM (nonischemic cardiomyopathy) ([place])   2. OSA on CPAP   3. PAF (paroxysmal atrial fibrillation) ([place])   4. Hypothyroidism, unspecified type   5. CKD (chronic kidney disease) stage 3, GFR 30-59 ml/min ([facility])   6. Morbid obesity due to excess calories ([place])   7. Depression, unspecified depression type   8. Long term current use of anticoagulant therapy     ASSESSMENT AND PLAN: Caitlyn. Adelstein is a 62 year old morbidly obese African-American female has a history of previous nonischemic cardiomyopathy with an ejection fraction of approximately 35% initially.  Subsequent echo Doppler study in 2013 showed her ejection fraction at 40-45%.  She has been maintaining normal sinus rhythm and has not had recurrent atrial fibrillation.  She has venous insufficiency and has a history of bilateral lower extremity edema.  She has a history of discoid lupus and is now followed closely by Dr. Edrick Oh of note nephrology for her renal insufficiency.  Because of edema, she has been taking metolazone 3 days/week and continues to take torsemide 40 mg twice a day.  She is maintaining sinus rhythm on long-acting carvedilol 80 mg daily and continues to be on Eliquis without bleeding or recurrent atrial fibrillation.  She is not having any anginal symptoms and continues to be on isosorbide 30 mg  daily.  She has depression and is on Cymbalta.  She continues to be on atorvastatin 40 mg for hyperlipidemia.  LDL cholesterol had reduced to 51 with treatment.  She is now back on CPAP therapy.  I have requested that a download be obtained and she may require pressure adjustment particularly if there is breakthrough snoring.  In 6 months, I have recommended she  undergo a follow-up echo Doppler study which will be 2 years from her last evaluation to reassess LV function both systolic and diastolic and valvular architecture.  She will follow-up with Dr. Justin Mend for her chronic kidney disease.  She continues to be on levothyroxine 50 mcg for hypothyroidism.  She is super morbidly obese with a BMI of 50.06.  Weight loss and increased exercise was recommended.  I will see her in 6 months for reevaluation.  Time spent: 25 minutes  Troy Sine, MD, [facility]  02/19/2018 7:21 PM

## 2018-02-19 ENCOUNTER — Encounter: Payer: Self-pay | Admitting: Cardiovascular Disease

## 2018-06-10 IMAGING — DX DG CHEST 1V PORT
1 series · 1 of 1 positions shown · non-contrast
Comparison: 08/10/2016

CLINICAL DATA: Sharp chest pain

EXAM:
PORTABLE CHEST 1 VIEW

[chest ap]
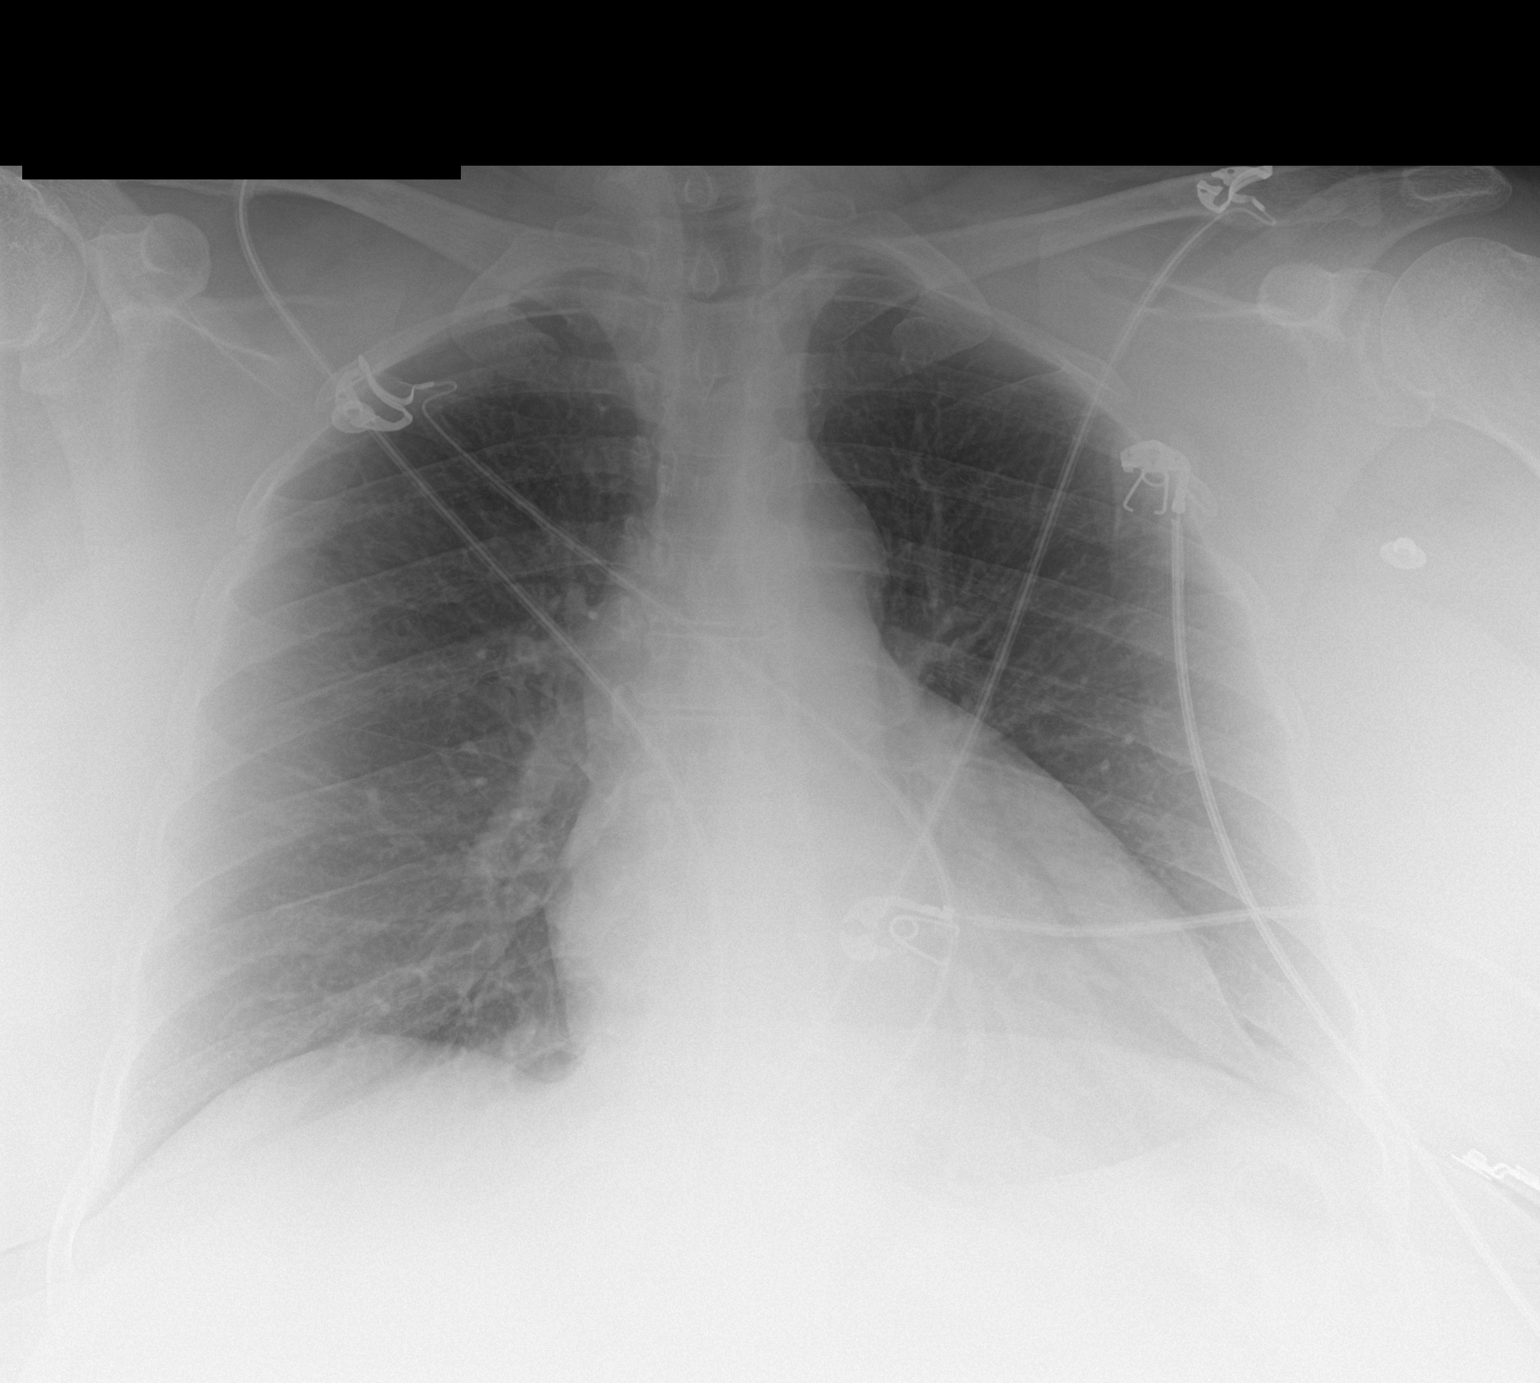

[1 of 1 positions shown; findings below may reference images not displayed]

FINDINGS: Mild cardiomegaly. Lungs are clear. No effusions or edema. No acute
bony abnormality.
IMPRESSION: Cardiomegaly.  No active disease.

## 2018-06-10 IMAGING — CT CT HEAD W/O CM
4 series · 16 of 47 positions shown, 18 images · non-contrast
Comparison: Brain MRI 06/01/1998

CLINICAL DATA: CT head with nausea and syncope

EXAM:
CT HEAD WITHOUT CONTRAST
TECHNIQUE: Contiguous axial images were obtained from the base of the skull
through the vertex without intravenous contrast.

[Series 3: head without · axial · non-contrast · 0.45mm/px · z∈[-265,-140]mm · 7 of 35 slices shown, 9 images]
[im 5/35  brain]
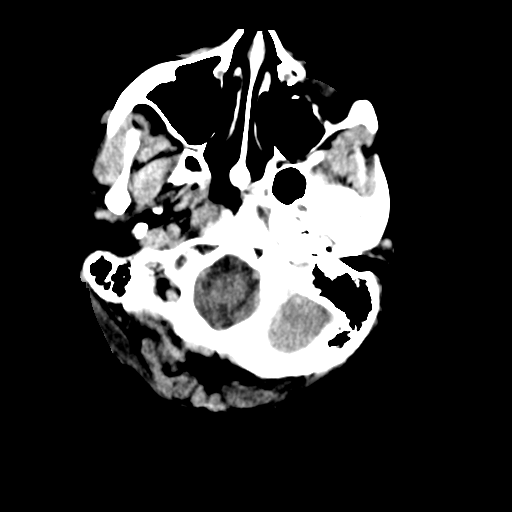
[im 5/35  bone]
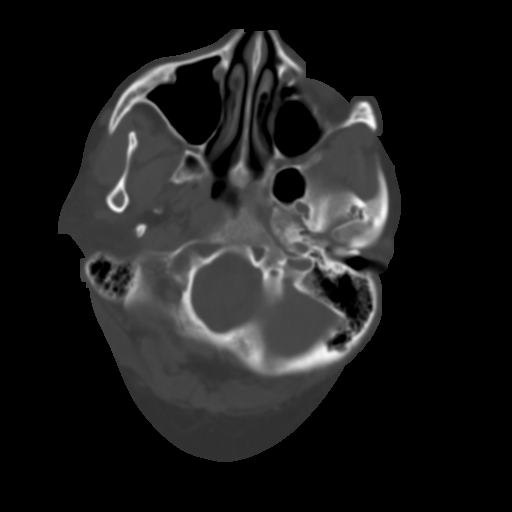
[im 9/35  brain]
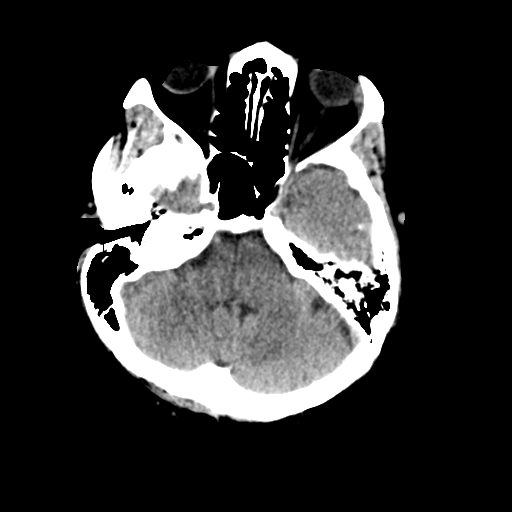
[im 13/35  brain]
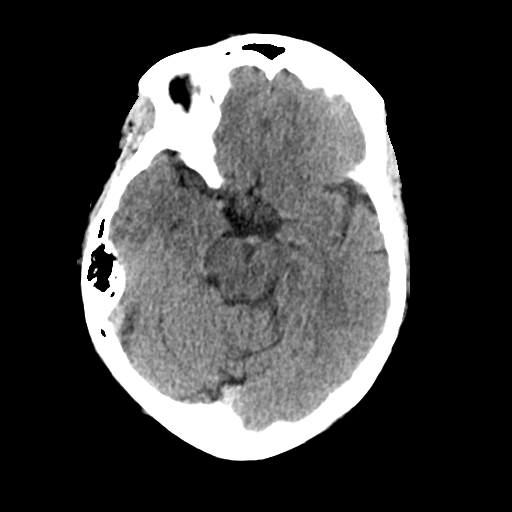
[im 18/35  brain]
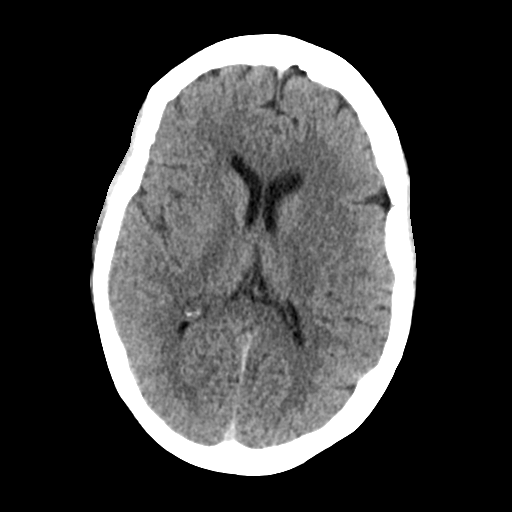
[im 22/35  brain]
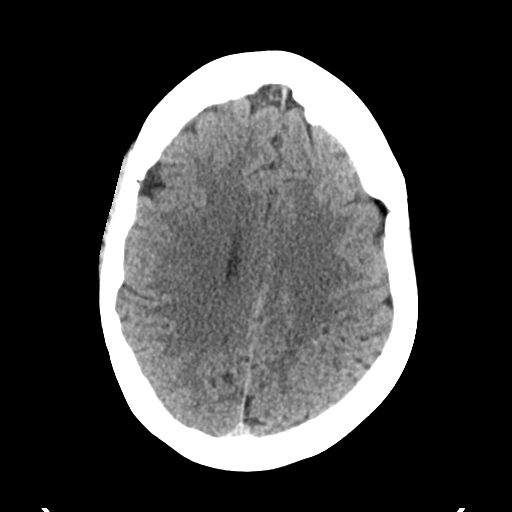
[im 22/35  bone]
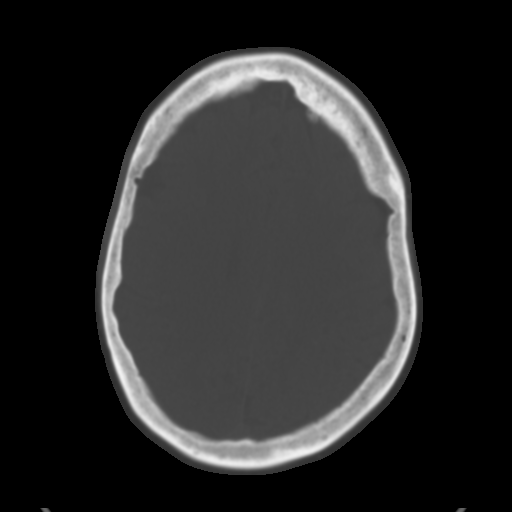
[im 26/35  brain]
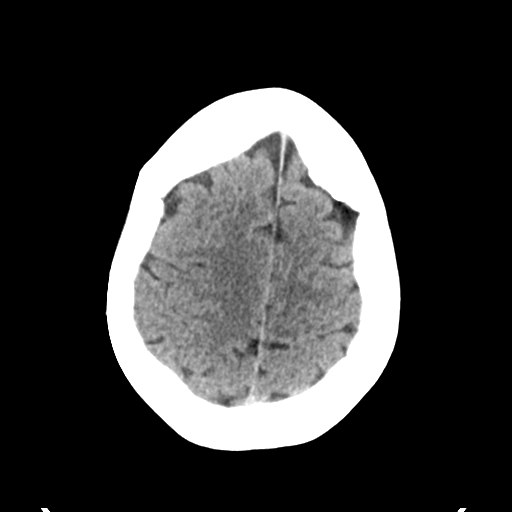
[im 30/35  brain]
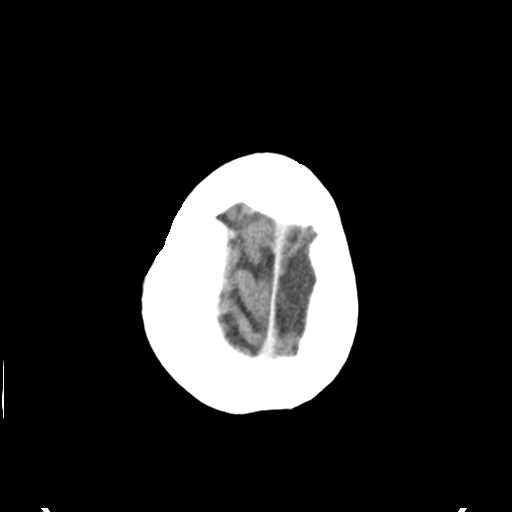

[Series 4: head bone · axial · 0.45mm/px · z∈[-269,-235]mm · 3 of 86 slices shown]
[im 9/86  bone]
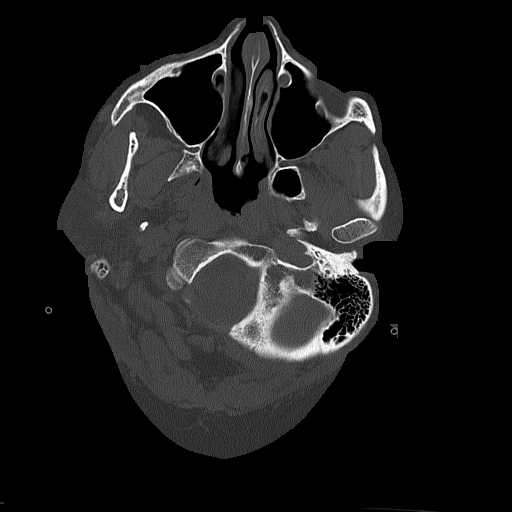
[im 18/86  bone]
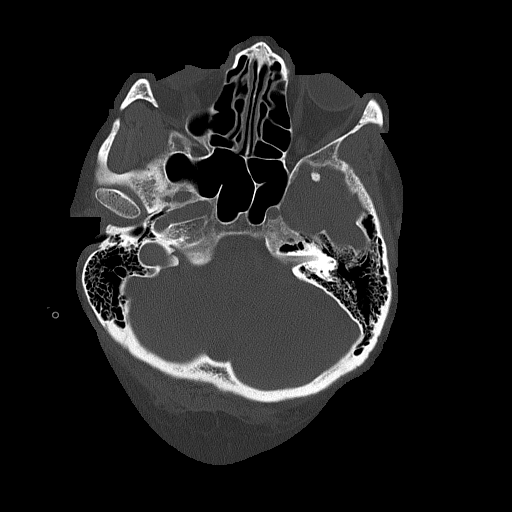
[im 26/86  bone]
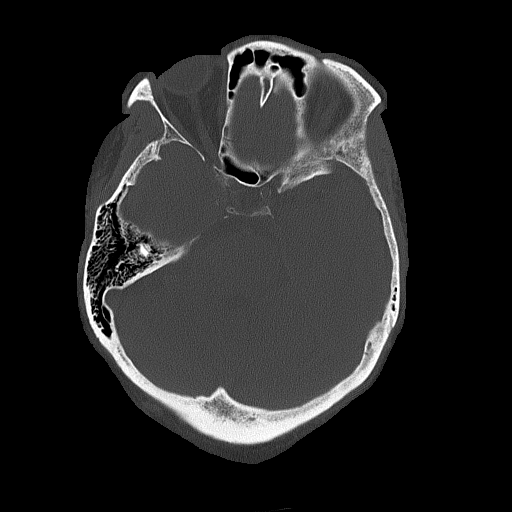

[Series 5: head without cor · coronal · non-contrast · 0.33mm/px · 3 of 74 slices shown]
[im 25/74  brain]
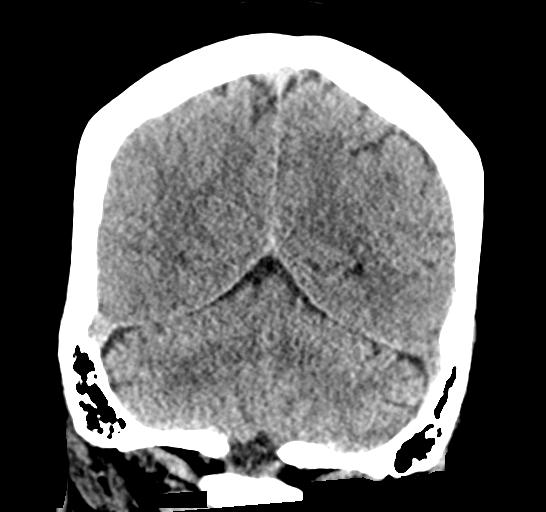
[im 33/74  brain]
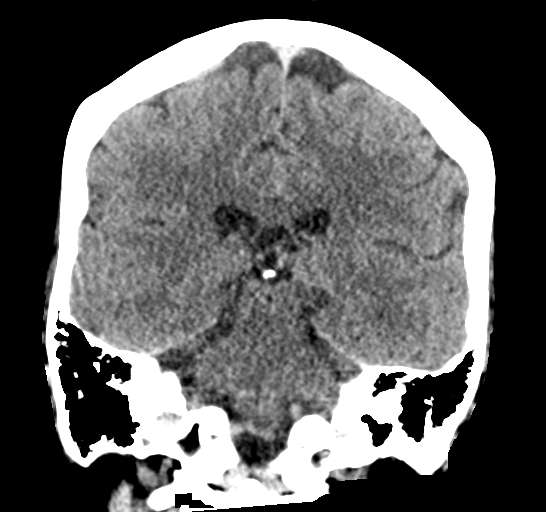
[im 41/74  brain]
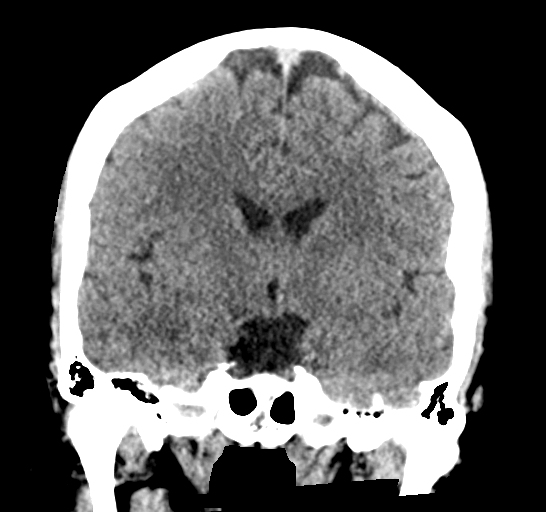

[Series 6: head without sag · sagittal · non-contrast · 0.33mm/px · 3 of 65 slices shown]
[im 22/65  brain]
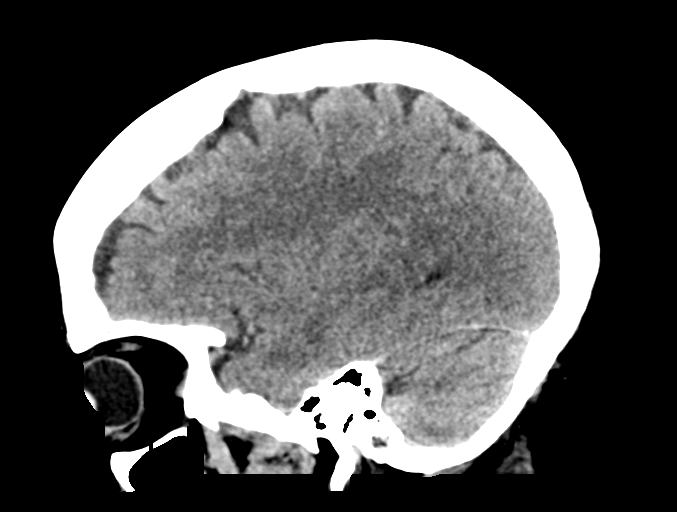
[im 33/65  brain]
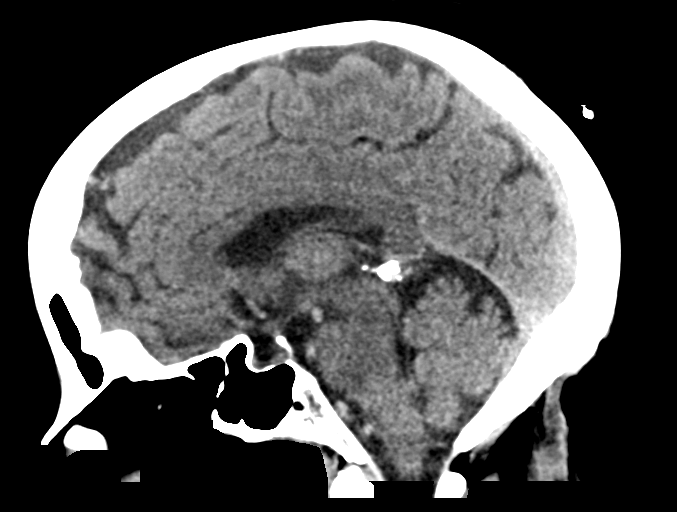
[im 43/65  brain]
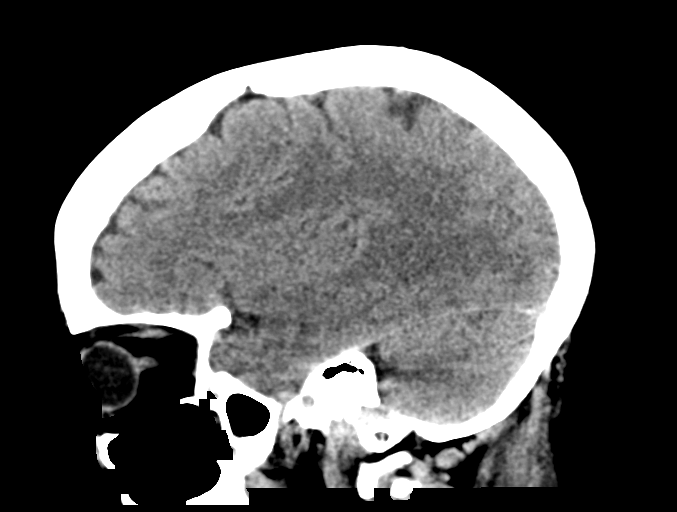

[16 of 47 positions shown; findings below may reference images not displayed]

FINDINGS: Brain: No acute intracranial hemorrhage. No focal mass lesion. No CT
evidence of acute infarction. No midline shift or mass effect. No
hydrocephalus. Basilar cisterns are patent.

Vascular: No hyperdense vessel or unexpected calcification.

Skull: Normal. Negative for fracture or focal lesion.

Sinuses/Orbits: Paranasal sinuses and mastoid air cells are clear.
Orbits are clear.

Other: None.
IMPRESSION: 1. No acute intracranial findings.
2. No change from comparison exam.

## 2018-08-12 ENCOUNTER — Telehealth: Payer: Self-pay | Admitting: Cardiovascular Disease

## 2018-08-12 NOTE — Telephone Encounter (Signed)
   Primary Cardiologist:  Shelva Majestic, MD   Patient contacted. I called and left a message. Base on the chart review, the timing of this echo appears to be elective. Will set priority level 3      History reviewed.  No symptoms to suggest any unstable cardiac conditions.  Based on discussion, with current pandemic situation, we will be postponing this appointment for [facility] with a plan for f/u in  > 12  wks or sooner if feasible/necessary.  If symptoms change, she has been instructed to contact our office.   Routing to C19 CANCEL pool for tracking (P CV DIV CV19 CANCEL - reason for visit "other.") and assigning priority (1 = 4-6 wks, 2 = 6-12 wks, 3 = >12 wks).   Mertie Moores, MD  08/12/2018 2:58 PM         .

## 2018-08-14 ENCOUNTER — Other Ambulatory Visit: Payer: Self-pay | Admitting: Cardiovascular Disease

## 2018-08-14 NOTE — Telephone Encounter (Signed)
63 yo, 128.2kg, Scr 1.8 on 07/03/18 from [place] 02/17/18 Indication Afib

## 2018-08-19 ENCOUNTER — Other Ambulatory Visit (HOSPITAL_COMMUNITY): Payer: 59

## 2018-08-27 ENCOUNTER — Telehealth: Payer: Self-pay | Admitting: Cardiovascular Disease

## 2018-08-27 NOTE — Telephone Encounter (Signed)
My Chart message sent

## 2018-09-11 ENCOUNTER — Ambulatory Visit: Payer: 59 | Admitting: Dietician

## 2018-10-07 ENCOUNTER — Encounter: Payer: Self-pay | Admitting: Cardiovascular Disease

## 2018-10-15 ENCOUNTER — Telehealth (HOSPITAL_COMMUNITY): Payer: Self-pay | Admitting: Radiology

## 2018-10-15 NOTE — Telephone Encounter (Signed)
Left message to call office-Patient needs to schedule an echocardiogram.  

## 2018-11-11 ENCOUNTER — Telehealth (HOSPITAL_COMMUNITY): Payer: Self-pay | Admitting: Radiology

## 2018-11-11 NOTE — Telephone Encounter (Signed)
Left message to call office-Patient needs to schedule an echocardiogram.  

## 2018-11-20 ENCOUNTER — Other Ambulatory Visit (HOSPITAL_COMMUNITY): Payer: 59

## 2018-11-23 ENCOUNTER — Other Ambulatory Visit: Payer: Self-pay | Admitting: Cardiovascular Disease

## 2018-12-09 ENCOUNTER — Other Ambulatory Visit (HOSPITAL_COMMUNITY): Payer: 59

## 2018-12-25 ENCOUNTER — Other Ambulatory Visit: Payer: Self-pay

## 2018-12-25 ENCOUNTER — Ambulatory Visit (HOSPITAL_COMMUNITY): Payer: 59 | Attending: Internal Medicine

## 2018-12-25 DIAGNOSIS — I428 Other cardiomyopathies: Secondary | ICD-10-CM | POA: Diagnosis not present

## 2018-12-25 MED ORDER — PERFLUTREN LIPID MICROSPHERE
1.0000 mL | INTRAVENOUS | Status: AC | PRN
  Administered 2018-12-25: 2 mL via INTRAVENOUS

## 2019-02-11 ENCOUNTER — Other Ambulatory Visit: Payer: Self-pay | Admitting: Cardiovascular Disease

## 2019-02-12 NOTE — Telephone Encounter (Signed)
Refill request for Eliquis

## 2019-03-09 ENCOUNTER — Telehealth: Payer: Self-pay | Admitting: Physician Assistant

## 2019-03-09 ENCOUNTER — Other Ambulatory Visit: Payer: Self-pay

## 2019-03-09 MED ORDER — TORSEMIDE 20 MG PO TABS: 40.0000 mg | ORAL_TABLET | Freq: Two times a day (BID) | ORAL | 0 refills | Status: DC

## 2019-03-09 NOTE — Telephone Encounter (Signed)
° ° °  Pt c/o swelling: STAT is pt has developed SOB within 24 hours  1) How much weight have you gained and in what time span?   2) If swelling, where is the swelling located? Ankles, feet, stomach  3) Are you currently taking a fluid pill? Refill requested  4) Are you currently SOB? Yes, at times  5) Do you have a log of your daily weights (if so, list)? 279 on Saturday  6) Have you gained 3 pounds in a day or 5 pounds in a week?   7) Have you traveled recently? no

## 2019-03-09 NOTE — Telephone Encounter (Signed)
Patient called, advised that she has been out of her medication for a while- and now has swelling and SOB, she was advised that she needed to keep her appointment on the 18th, but I would refill med for now, but she should come in to be seen. Patient verbalized understanding.  Advised to elevate feet, monitor salt intake, and wear compression stockings. Patient will call back after restarting her medications if she is not better. Patient denies chest pain, only complaints of sob and swelling.

## 2019-03-09 NOTE — Telephone Encounter (Signed)
Patient called in- she states that she has been out of her Demadex for a while- and now she is swelling so bad and has SOB. I advised that patient should keep upcoming appointment for 11/18- but I would send in medication to restart and be seen on her appointment in case other changes needed to be made.   Patient verbalized understanding, no chest pain, only sob and swelling.  Advised to elevate feet, monitor salt intake and wear compression stockings. Patient will call back if any issues after restarting her fluid pill.

## 2019-03-09 NOTE — Telephone Encounter (Signed)
° ° ° °*  STAT* If patient is at the pharmacy, call can be transferred to refill team.   1. Which medications need to be refilled? (please list name of each medication and dose if known) torsemide (DEMADEX) 20 MG tablet  2. Which pharmacy/location (including street and city if local pharmacy) is medication to be sent to?[id number] - [place], [place] - [address] AT [address]OSS FROM [address]  3. Do they need a 30 day or 90 day supply? [place]

## 2019-03-11 ENCOUNTER — Telehealth (INDEPENDENT_AMBULATORY_CARE_PROVIDER_SITE_OTHER): Payer: Medicare Other | Admitting: Physician Assistant

## 2019-03-11 ENCOUNTER — Telehealth: Payer: Self-pay

## 2019-03-11 ENCOUNTER — Encounter: Payer: Self-pay | Admitting: Physician Assistant

## 2019-03-11 VITALS — Ht 63.5 in | Wt 278.0 lb

## 2019-03-11 DIAGNOSIS — E785 Hyperlipidemia, unspecified: Secondary | ICD-10-CM

## 2019-03-11 DIAGNOSIS — M1A0790 Idiopathic chronic gout, unspecified ankle and foot, without tophus (tophi): Secondary | ICD-10-CM

## 2019-03-11 DIAGNOSIS — R600 Localized edema: Secondary | ICD-10-CM

## 2019-03-11 DIAGNOSIS — G4733 Obstructive sleep apnea (adult) (pediatric): Secondary | ICD-10-CM

## 2019-03-11 DIAGNOSIS — N184 Chronic kidney disease, stage 4 (severe): Secondary | ICD-10-CM

## 2019-03-11 DIAGNOSIS — I428 Other cardiomyopathies: Secondary | ICD-10-CM

## 2019-03-11 DIAGNOSIS — Z9989 Dependence on other enabling machines and devices: Secondary | ICD-10-CM

## 2019-03-11 DIAGNOSIS — E039 Hypothyroidism, unspecified: Secondary | ICD-10-CM

## 2019-03-11 DIAGNOSIS — I480 Paroxysmal atrial fibrillation: Secondary | ICD-10-CM

## 2019-03-11 DIAGNOSIS — I10 Essential (primary) hypertension: Secondary | ICD-10-CM

## 2019-03-11 NOTE — Patient Instructions (Signed)
Medication Instructions:   Your physician recommends that you continue on your current medications as directed. Please refer to the Current Medication list given to you today.   *If you need a refill on your cardiac medications before your next appointment, please call your pharmacy*  Lab Work:  NONE ordered at this time of appointment   If you have labs (blood work) drawn today and your tests are completely normal, you will receive your results only by: Marland Kitchen MyChart Message (if you have MyChart) OR . A paper copy in the mail If you have any lab test that is abnormal or we need to change your treatment, we will call you to review the results.  Testing/Procedures:  NONE ordered at this time of appointment   Follow-Up: At [facility], you and your health needs are our priority.  As part of our continuing mission to provide you with exceptional heart care, we have created designated Provider Care Teams.  These Care Teams include your primary Cardiologist (physician) and Advanced Practice Providers (APPs -  Physician Assistants and Nurse Practitioners) who all work together to provide you with the care you need, when you need it.  Your next appointment:   6 month(s)  The format for your next appointment:   Virtual Visit   Provider:   You may see Shelva Majestic, MD or one of the following Advanced Practice Providers on your designated Care Team:    Almyra Deforest, PA-C  Fabian Sharp, PA-C or   Roby Lofts, [place]  Other Instructions

## 2019-03-11 NOTE — Telephone Encounter (Signed)

## 2019-03-11 NOTE — Telephone Encounter (Signed)
Called patient to discuss AVS instructions gave Hao Meng's recommendations and patient voiced understanding. AVS summary mailed to patient.    

## 2019-03-11 NOTE — Progress Notes (Signed)
Virtual Visit via Telephone Note   This visit type was conducted due to national recommendations for restrictions regarding the COVID-19 Pandemic (e.g. social distancing) in an effort to limit this patient's exposure and mitigate transmission in our community.  Due to her co-morbid illnesses, this patient is at least at moderate risk for complications without adequate follow up.  This format is felt to be most appropriate for this patient at this time.  The patient did not have access to video technology/had technical difficulties with video requiring transitioning to audio format only (telephone).  All issues noted in this document were discussed and addressed.  No physical exam could be performed with this format.  Please refer to the patient's chart for her  consent to telehealth for [facility].   Date:  03/11/2019   ID:  Caitlyn Fuentes, DOB [birth year], MRN [id number]  Patient Location: Home Provider Location: Home  PCP:  [name], MD  Cardiologist:  Shelva Majestic, MD  Electrophysiologist:  None  Nephrologist: Dr. Justin Mend  Evaluation Performed:  Follow-Up Visit  Chief Complaint:  followup  History of Present Illness:    Caitlyn Fuentes is a 63 y.o. female with PMH of HTN, HLD, OSA, hypothyroidism, NICM, CKD, lupusand PAF on eliquis. She initially presented with atrial fibrillation in 2008 and found to have nonischemic cardiomyopathy. Cardiac catheterization did not show significant coronary artery disease, although she was found to have mild pulmonary hypertension.She had a low risk Myoview in 2013. She has OSA documented since 2008 and has been using CPAP. She also has lower extremity venous insufficiency contributing to edema. Previous workup demonstrated venous insufficiency within the left common femoral vein and right and left greater saphenous vein.   She was seen in April 2018 in the ED for volume excess. She was given metolazone and had a follow-up later that week,  however when she returned to her appointment, she became unresponsive in the waiting room and was sent to the ED emergently due to concern for acute stroke. She was admitted from 4/27-4/30, stroke was ruled out. CT of the head was negative. MRI was negative for acute CVA. Echocardiogram showed stable EF 40-45%. Carotid ultrasound was negative for significant stenosis.She had evidence of dehydration and over diuresis. It was felt she was taking too much metolazone.(she later told me that she misunderstood and was taking metolazone BID along with every dose of torsemide)Serum creatinine was 2.6 on arrival, it was 1.17 at baseline. Creatinine eventually peaked at 2.8. Diuretic was held but resumed at half dose on discharge.  I later saw the patient in September 2018 for volume overload.  Torsemide was increased again.  She had a Myoview on 04/11/2017 which showed EF 45%, medium defect of mild severity in the basal inferolateral, mid inferolateral and apical lateral location which was fixed, no reversible ischemia was noted.  She was last seen by Dr. Claiborne Billings on 02/09/2018.  Dr. Claiborne Billings recommended a 2-month follow-up and a repeat echocardiogram prior to follow-up to reassess her EF.  Repeat echocardiogram was obtained on 12/25/2018 which showed EF 40 to 45%, no wall motion abnormality was noted, no significant valve issue.  When compared to her previous echocardiogram in 2018, EF was stable.  Patient presents today for follow-up.  She denies any chest pain or shortness of breath.  She continues to have some lower extremity edema that is worse throughout the day as she is standing on her feet.  Some of this likely is related to venous stasis.  Her  diuretic has not been changed.  She says she last saw Dr. Justin Mend several months ago..  She took some educational class for possible dialysis however was told she does not need it now.  I do not have the last lab work however based on the patient's description, her GFR likely will  put her in stage IV CKD category.  She also has quite significant gout and require steroid therapy and Uloric from her PCP.  Onset of gout is likely also related to her need for diuresis as well.  Heart perspective, she seems to be quite stable.  I recommended she follow-up with Dr. Claiborne Billings in 6 months.  One of her issue that was discussed today is her inability to obtain CPAP equipment.  Her CPAP machine is more than 62 years old and she has been unable to obtain supplies for the past year.  She says her previous supply company was bought out by [employer].  When she called [employer], she was told to contact someone in [place] which directed her to someone else in [place].  She has been calling back and forth between [place] before she finally gave up.  She is requesting [place] to help her to obtain CPAP machine supplies.  The patient does not have symptoms concerning for COVID-19 infection (fever, chills, cough, or new shortness of breath).    Past Medical History:  Diagnosis Date   A-fib ([place])    Cardiomyopathy    Hyperlipidemia    Hypertension    Lupus ([place])    Pulmonary hypertension ([place]) 05/10/2011   Echo, EF-40-45   Renal disorder    Sleep apnea 2008   CPAP, pt does not know settings   Thyroid disease    Past Surgical History:  Procedure Laterality Date    c sections  x 2   ABDOMINAL HYSTERECTOMY     complete   CARDIAC CATHETERIZATION     Nonischemic, EF-40, continued medical therapy   CARDIAC CATHETERIZATION  10/02/2006   No significant CAD   COLONOSCOPY WITH PROPOFOL N/A 11/22/2015   Procedure: COLONOSCOPY WITH PROPOFOL;  Surgeon: Juanita Craver, MD;  Location: [facility];  Service: Endoscopy;  Laterality: N/A;   ESOPHAGOGASTRODUODENOSCOPY (EGD) WITH PROPOFOL N/A 11/22/2015   Procedure: ESOPHAGOGASTRODUODENOSCOPY (EGD) WITH PROPOFOL;  Surgeon: Juanita Craver, MD;  Location: [facility];  Service: Endoscopy;  Laterality: N/A;     Current Meds    Medication Sig   atorvastatin (LIPITOR) 40 MG tablet Take 1 tablet (40 mg total) by mouth daily. *NEEDS OFFICE VISIT FOR FURTHER REFILLS*   Buprenorphine HCl (BELBUCA) 300 MCG FILM Place 300 mcg inside cheek.    carvedilol (COREG CR) 80 MG 24 hr capsule TAKE 1 CAPSULE (80 MG TOTAL) BY MOUTH DAILY.   colchicine 0.6 MG tablet Take 0.6 mg by mouth daily.   diclofenac sodium (VOLTAREN) 1 % GEL Apply topically 4 (four) times daily.   DULoxetine (CYMBALTA) 30 MG capsule Take 30 mg by mouth 2 (two) times daily.   ELIQUIS 5 MG TABS tablet TAKE 1 TABLET (5 MG TOTAL) BY MOUTH 2 (TWO) TIMES DAILY.   estradiol (ESTRACE) 2 MG tablet Take 2 mg by mouth daily.    febuxostat (ULORIC) 40 MG tablet Take 40 mg by mouth daily.   gabapentin (NEURONTIN) 300 MG capsule Take 300 mg by mouth 3 (three) times daily.   HYDROcodone-acetaminophen (NORCO/VICODIN) 5-325 MG tablet Take 1 tablet by mouth every 6 (six) hours as needed for moderate pain.   hydroxychloroquine (PLAQUENIL) 200  MG tablet Take 200 mg by mouth daily.   levothyroxine (SYNTHROID) 100 MCG tablet Take 100 mcg by mouth daily.    methocarbamol (ROBAXIN) 500 MG tablet Take 500 mg by mouth every 6 (six) hours as needed.    metolazone (ZAROXOLYN) 2.5 MG tablet Take 1 tablet by mouth as directed.   omega-3 acid ethyl esters (LOVAZA) 1 g capsule Take 1 capsule (1 g total) by mouth daily.   potassium chloride SA (KLOR-CON M20) 20 MEQ tablet    torsemide (DEMADEX) 20 MG tablet Take 2 tablets (40 mg total) by mouth 2 (two) times daily. *NEEDS OFFICE VISIT FOR FURTHER REFILLS*   zolpidem (AMBIEN) 10 MG tablet Take 10 mg by mouth at bedtime as needed for sleep.      Allergies:   Diovan [valsartan], Lisinopril, and Metolazone   Social History   Tobacco Use   Smoking status: Former Smoker    Quit date: 12/25/2008    Years since quitting: 10.2   Smokeless tobacco: Never Used  Substance Use Topics   Alcohol use: Yes    Alcohol/week: 0.0  standard drinks    Comment: once a month   Drug use: No     Family Hx: The patient's family history includes Heart disease in her brother, father, and mother; Hypertension in her sister; Lung disease in her sister.  ROS:   Please see the history of present illness.     All other systems reviewed and are negative.   Prior CV studies:   The following studies were reviewed today:  Echo 12/25/2018 1. The left ventricle has mild-moderately reduced systolic function, with an ejection fraction of 40-45%. The cavity size was mild to moderately dilated. Left ventricular diastolic Doppler parameters are indeterminate. No evidence of left ventricular  regional wall motion abnormalities.  2. The mitral valve is abnormal. Mild thickening of the mitral valve leaflet.  3. The aortic valve was not well visualized. No stenosis of the aortic valve.  4. The aorta is normal unless otherwise noted.  5. When compared to the prior study: 08/19/2016: LVEF 40-45%.  Labs/Other Tests and Data Reviewed:    EKG:  An ECG dated 02/17/2018 was personally reviewed today and demonstrated:  Normal sinus rhythm with nonspecific ST-T wave changes.  Recent Labs: No results found for requested labs within last 8760 hours.   Recent Lipid Panel Lab Results  Component Value Date/Time   CHOL 126 08/18/2016 08:27 AM   TRIG 178 (H) 08/18/2016 08:27 AM   HDL 39 (L) 08/18/2016 08:27 AM   CHOLHDL 3.2 08/18/2016 08:27 AM   LDLCALC 51 08/18/2016 08:27 AM    Wt Readings from Last 3 Encounters:  03/11/19 278 lb (126.1 kg)  02/17/18 282 lb 9.6 oz (128.2 kg)  04/29/17 274 lb (124.3 kg)     Objective:    Vital Signs:  Ht 5' 3.5" (1.613 m)    Wt 278 lb (126.1 kg)    BMI 48.47 kg/m    VITAL SIGNS:  reviewed  ASSESSMENT & PLAN:    1. Nonischemic cardiomyopathy: Recent echocardiogram showed EF stable EF 40 to 45%.  2. PAF: Continue carvedilol and Eliquis  3. Hypothyroidism: On levothyroxine.  Managed by primary care  provider.  4. Obstructive sleep apnea: Her previous CPAP equipment company was bought out by [employer], unfortunately, she has been calling between [place] and [place].   5. Hypertension: Continue on current therapy.  No vital signs were available today  6. Hyperlipidemia: Continue Lipitor  7. CKD  stage IV: Followed by Dr. Justin Mend  8. Gout: On steroid and Uloric  9. Leg Edema: I suspect some of her leg edema is related to venous insufficiency.  Currently on torsemide 40 mg twice daily and 3 times weekly dosing of metolazone every Monday Wednesday and Friday.   COVID-19 Education: The signs and symptoms of COVID-19 were discussed with the patient and how to seek care for testing (follow up with PCP or arrange E-visit).  The importance of social distancing was discussed today.  Time:   Today, I have spent 20 minutes with the patient with telehealth technology discussing the above problems.     Medication Adjustments/Labs and Tests Ordered: Current medicines are reviewed at length with the patient today.  Concerns regarding medicines are outlined above.   Tests Ordered: No orders of the defined types were placed in this encounter.   Medication Changes: No orders of the defined types were placed in this encounter.   Follow Up:  Either In Person or Virtual in 6 month(s)  Signed, Almyra Deforest, [place]  03/11/2019 3:35 PM    [place]

## 2019-03-30 ENCOUNTER — Other Ambulatory Visit: Payer: Self-pay

## 2019-03-30 MED ORDER — TORSEMIDE 20 MG PO TABS: 40.0000 mg | ORAL_TABLET | Freq: Two times a day (BID) | ORAL | 0 refills | Status: DC

## 2019-03-30 MED ORDER — ATORVASTATIN CALCIUM 40 MG PO TABS: 40.0000 mg | ORAL_TABLET | Freq: Every day | ORAL | 0 refills | Status: DC

## 2019-04-01 ENCOUNTER — Other Ambulatory Visit: Payer: Self-pay | Admitting: Cardiovascular Disease

## 2019-04-22 ENCOUNTER — Other Ambulatory Visit: Payer: Self-pay | Admitting: Cardiovascular Disease

## 2019-04-22 NOTE — Telephone Encounter (Signed)
Rx(s) sent to pharmacy electronically.  

## 2019-07-19 ENCOUNTER — Other Ambulatory Visit: Payer: Self-pay

## 2019-07-20 MED ORDER — APIXABAN 5 MG PO TABS: 5.0000 mg | ORAL_TABLET | Freq: Two times a day (BID) | ORAL | 0 refills | Status: DC

## 2019-08-05 ENCOUNTER — Telehealth: Payer: Self-pay | Admitting: Cardiovascular Disease

## 2019-08-05 NOTE — Telephone Encounter (Signed)
Per Mariann Laster CMA, left sample of ResMed N30 nasal mask and tubing for patient to pick up

## 2019-09-25 ENCOUNTER — Telehealth (INDEPENDENT_AMBULATORY_CARE_PROVIDER_SITE_OTHER): Payer: Medicare Other | Admitting: Cardiovascular Disease

## 2019-09-25 VITALS — Ht 63.0 in | Wt 261.0 lb

## 2019-09-25 DIAGNOSIS — Z9989 Dependence on other enabling machines and devices: Secondary | ICD-10-CM

## 2019-09-25 DIAGNOSIS — Z7901 Long term (current) use of anticoagulants: Secondary | ICD-10-CM

## 2019-09-25 DIAGNOSIS — I10 Essential (primary) hypertension: Secondary | ICD-10-CM

## 2019-09-25 DIAGNOSIS — I480 Paroxysmal atrial fibrillation: Secondary | ICD-10-CM

## 2019-09-25 DIAGNOSIS — E039 Hypothyroidism, unspecified: Secondary | ICD-10-CM

## 2019-09-25 DIAGNOSIS — N184 Chronic kidney disease, stage 4 (severe): Secondary | ICD-10-CM

## 2019-09-25 DIAGNOSIS — G4733 Obstructive sleep apnea (adult) (pediatric): Secondary | ICD-10-CM

## 2019-09-25 DIAGNOSIS — I428 Other cardiomyopathies: Secondary | ICD-10-CM | POA: Diagnosis not present

## 2019-09-25 DIAGNOSIS — M1A0790 Idiopathic chronic gout, unspecified ankle and foot, without tophus (tophi): Secondary | ICD-10-CM

## 2019-09-25 MED ORDER — ATORVASTATIN CALCIUM 40 MG PO TABS: 40.0000 mg | ORAL_TABLET | Freq: Every day | ORAL | 3 refills | Status: DC

## 2019-09-25 NOTE — Progress Notes (Signed)
Virtual Visit via Telephone Note   This visit type was conducted due to national recommendations for restrictions regarding the COVID-19 Pandemic (e.g. social distancing) in an effort to limit this patient's exposure and mitigate transmission in our community.  Due to her co-morbid illnesses, this patient is at least at moderate risk for complications without adequate follow up.  This format is felt to be most appropriate for this patient at this time.  The patient did not have access to video technology/had technical difficulties with video requiring transitioning to audio format only (telephone).  All issues noted in this document were discussed and addressed.  No physical exam could be performed with this format.  Please refer to the patient's chart for her  consent to telehealth for [facility].   The patient was identified using 2 identifiers.  Date:  09/25/2019   ID:  Caitlyn Fuentes, DOB 12/03/1955, MRN [id number]  Patient Location: Home Provider Location: Office  PCP:  [name], MD  Cardiologist:  Shelva Majestic, MD  Electrophysiologist:  None   Evaluation Performed:  Follow-Up Visit  Chief Complaint:  20 month F/U  History of Present Illness:    Caitlyn Fuentes is a 64 y.o. female who initially presented with atrial fibrillation in 2008 was found to have a nonischemic cardiomyopathy. Cardiac catheterization did not show significant coronary obstructive disease and she was found to have mild pulmonary hypertension.   She has obstructive sleep apnea documented since 2008 and has been using CPAP.  She had undergone a follow-up sleep study in 2013, but never follow-up with getting a new CPAP machine.  She uses [employer] for her DME company.   Additional problems include with obesity, hypertension, mild renal insufficiency, as well as lower extremity edema.  There also is a history of lupus as well as gout.  She has lower extremity venous reflux disease with deep  venous insufficiency within the left common femoral vein and right and left greater saphenous veins have demonstrated valvular insufficiency, as well as the left short saphenous vein.    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  When I saw her in July 2017, she was unaware of any recurrent atrial fibrillation.  He states that she had seen a neurologist.  She had not been using CPAP due to her machine malfunction.  He referred her for a another sleep study, which was interpreted in October 2017 by Dr. Annamaria Boots which showed severe sleep apnea with an AHI of 33.7.  AHI during REM sleep was 48.  She dropped her oxygen to 86% and there was moderate snoring.  She subsequently underwent a CPAP titration was felt that her optimal CPAP pressure was 9 cm. she had never seen Dr. Annamaria Boots and when I last saw her in April.  She requested that I follow her sleep apnea and recommended her DME company to contact her regarding a new machine.  Since I  saw her in April 2018, she was hospitalized with volume excess and had seen Richardson Dopp prior to her admission.  There was some concern for possible stroke but this was ruled out.  A head CT was negative as was an MRI.  Repeat echo showed EF at 40-45%.  Carotid ultrasound was negative for significant ICA stenosis.  He had significant dehydration and over diuresis and her serum creatinine had increased to 2.6 and peaked at 2.8.  Rx were held and  ultimately resumed at half dose at discharge.  She saw Richardson Dopp back in follow-up on 08/28/2016.  When I saw her in June 2018 she was very tearful and obviously depressed.  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  She also has been seen by Dr. Justin Mend for renal follow-up and her creatinine had improved to 1.7.  She was maintaining sinus rhythm without recurrent AF.  Since I last saw her, she was  seen in September, October, and December 2018 by Almyra Deforest, [facility].  She had gained over 20 pounds and had noticed increasing swelling of her arms and legs.  He increase torsemide to 40 mg twice a day.  He recommended a 2 day Lexiscan Myoview study.  Her blood pressure was elevated and with her history of a nonischemic myopathy isosorbide 30 mg was initiated.  [place] study showed an EF of 45%.  There was a questionable inferolateral defect without associated ischemia.  When I saw her in January 2019 she had lost lost 15 pounds.  She wasback using CPAP therapy.  She saw Dr. Edrick Oh in follow-up.  She denied any chest pain continue to be on Coreg CR 80 mg, hydralazine 50 mg twice a day, isosorbide 30 mg and torsemide 40 mg daily.  She was taking metolazone 1 day/week and was on Eliquis.  She was maintaining sinus rhythm.  I last saw her in October 2019 at which time she was having  difficulty with osteoarthritis particularly involving her pelvis hands and knees.  She has been under pain management with Dr. Tedd Sias.  She admits to occasional swelling and has been taking metolazone 3 times per week.  She has been unaware of any abnormal rhythm.  She was using CPAP but her husband states she  had instances where she continued to snore on her current pressure.    I have not seen her since October 2019.  She underwent a follow-up echo Doppler study in September 2020 which was technically difficult due to poor acoustical windows.  EF was 40 to 45%.  Her LV was mild to moderately dilated.  Recently she has had problems with gout as well as her kidneys.  She sees Dr. Justin Mend for stage IV chronic kidney disease and has been seeing Dr.Belfi at [facility] and has received [place].  She continues to have issues with leg swelling and continues to take metolazone on Monday Wednesday and Friday.  She denies chest pain.  She is now followed by Dr. Salina April at [facility] for primary care.  She continues  to use CPAP and is in need for new supplies.  I obtained a new download today from May 5 through September 24, 2019 which confirms 100% compliance.  Average usage is 9 hours and 13 minutes per evening.  At a set pressure of 9 cm AHI is excellent at 1.9.  There is no significant leak.  She presents for reevaluation.   The patient does not have symptoms concerning for COVID-19 infection (fever, chills, cough, or new shortness of breath).    Past Medical History:  Diagnosis Date  . A-fib ([place])   . Cardiomyopathy   . Hyperlipidemia   . Hypertension   . Lupus ([place])   . Pulmonary hypertension ([place]) 05/10/2011   Echo, EF-40-45  . Renal disorder   . Sleep apnea 2008   CPAP, pt does not know settings  . Thyroid disease    Past  Surgical History:  Procedure Laterality Date  .  c sections  x 2  . ABDOMINAL HYSTERECTOMY     complete  . CARDIAC CATHETERIZATION     Nonischemic, EF-40, continued medical therapy  . CARDIAC CATHETERIZATION  10/02/2006   No significant CAD  . COLONOSCOPY WITH PROPOFOL N/A 11/22/2015   Procedure: COLONOSCOPY WITH PROPOFOL;  Surgeon: Juanita Craver, MD;  Location: [facility];  Service: Endoscopy;  Laterality: N/A;  . ESOPHAGOGASTRODUODENOSCOPY (EGD) WITH PROPOFOL N/A 11/22/2015   Procedure: ESOPHAGOGASTRODUODENOSCOPY (EGD) WITH PROPOFOL;  Surgeon: Juanita Craver, MD;  Location: [facility];  Service: Endoscopy;  Laterality: N/A;     Current Meds  Medication Sig  . apixaban (ELIQUIS) 5 MG TABS tablet Take 1 tablet (5 mg total) by mouth 2 (two) times daily. PT DUE FOR OV PLEASE CALL FOR APPT  . atorvastatin (LIPITOR) 40 MG tablet Take 1 tablet (40 mg total) by mouth daily. *NEEDS OFFICE VISIT FOR FURTHER REFILLS*  . Azelaic Acid 15 % cream   . Buprenorphine HCl (BELBUCA) 600 MCG FILM Place 600 mcg inside cheek.   . carvedilol (COREG CR) 80 MG 24 hr capsule TAKE 1 CAPSULE (80 MG TOTAL) BY MOUTH DAILY.  . clotrimazole (LOTRIMIN) 1 % cream Apply 1 application topically 2 (two)  times daily.  . colchicine 0.6 MG tablet Take 0.6 mg by mouth daily.  . diclofenac sodium (VOLTAREN) 1 % GEL Apply topically 4 (four) times daily.  . DULoxetine (CYMBALTA) 30 MG capsule Take 30 mg by mouth 2 (two) times daily.  Marland Kitchen estradiol (ESTRACE) 2 MG tablet Take 2 mg by mouth daily.   . febuxostat (ULORIC) 40 MG tablet Take 40 mg by mouth daily.  Marland Kitchen gabapentin (NEURONTIN) 300 MG capsule Take 300 mg by mouth 3 (three) times daily.  Marland Kitchen HYDROcodone-acetaminophen (NORCO/VICODIN) 5-325 MG tablet Take 1 tablet by mouth every 6 (six) hours as needed for moderate pain.  . hydroxychloroquine (PLAQUENIL) 200 MG tablet Take 200 mg by mouth daily.  Marland Kitchen levothyroxine (SYNTHROID) 100 MCG tablet Take 100 mcg by mouth daily.   . methocarbamol (ROBAXIN) 500 MG tablet Take 500 mg by mouth every 6 (six) hours as needed.   . metolazone (ZAROXOLYN) 2.5 MG tablet Take 1 tablet by mouth as directed.  Marland Kitchen omega-3 acid ethyl esters (LOVAZA) 1 g capsule Take 1 capsule (1 g total) by mouth daily.  . potassium chloride SA (KLOR-CON M20) 20 MEQ tablet   . torsemide (DEMADEX) 20 MG tablet TAKE 2 TABLETS (40 MG TOTAL) BY MOUTH 2 (TWO) TIMES DAILY. *NEEDS OFFICE VISIT FOR FURTHER REFILLS*  . zolpidem (AMBIEN) 10 MG tablet Take 10 mg by mouth at bedtime as needed for sleep.      Allergies:   Diovan [valsartan], Lisinopril, and Metolazone   Social History   Tobacco Use  . Smoking status: Former Smoker    Quit date: 12/25/2008    Years since quitting: 10.7  . Smokeless tobacco: Never Used  Substance Use Topics  . Alcohol use: Yes    Alcohol/week: 0.0 standard drinks    Comment: once a month  . Drug use: No     Family Hx: The patient's family history includes Heart disease in her brother, father, and mother; Hypertension in her sister; Lung disease in her sister.  ROS:   Please see the history of present illness.    No fevers chills night sweats No significant weight loss No chest pain No palpitations Positive  leg swelling Positive OSA with 100% CPAP compliance  CKD, stage IV followed by Dr. Justin Mend Recent worsening gout, now followed by [facility]. Tamera Punt at [facility] and undergoing Krystexa infusions All other systems reviewed and are negative.   Prior CV studies:   The following studies were reviewed today:   ECHO: 12/25/2018 IMPRESSIONS  1. The left ventricle has mild-moderately reduced systolic function, with  an ejection fraction of 40-45%. The cavity size was mild to moderately  dilated. Left ventricular diastolic Doppler parameters are indeterminate.  No evidence of left ventricular  regional wall motion abnormalities.  2. The mitral valve is abnormal. Mild thickening of the mitral valve  leaflet.  3. The aortic valve was not well visualized. No stenosis of the aortic  valve.  4. The aorta is normal unless otherwise noted.  5. When compared to the prior study: 08/19/2016: LVEF 40-45%.   SUMMARY    Technically difficult study. Definity contrast given. Poor echo  windows. LVEF 40-45%, global hypokinesis, mild to moderately dilated  LV with normal wall thickness  FINDINGS  Left Ventricle: The left ventricle has mild-moderately reduced systolic  function, with an ejection fraction of 40-45%. The cavity size was mild to  moderately dilated. There is no increase in left ventricular wall  thickness. Left ventricular diastolic  Doppler parameters are indeterminate. No evidence of left ventricular  regional wall motion abnormalities.. Definity contrast agent was given IV  to delineate the left ventricular endocardial borders.   Right Ventricle: The right ventricle was not well visualized. The cavity  was not assessed. There is right vetricular wall thickness was not  assessed.   Left Atrium: Left atrial size was normal in size.   Right Atrium: Right atrial size was normal in size. Right atrial pressure  is estimated at 10 mmHg.   Interatrial Septum: No atrial level shunt detected  by color flow Doppler.   Pericardium: There is no evidence of pericardial effusion.   Mitral Valve: The mitral valve is abnormal. Mild thickening of the mitral  valve leaflet. Mitral valve regurgitation is mild by color flow Doppler.   Tricuspid Valve: The tricuspid valve is not well visualized. Tricuspid  valve regurgitation was not visualized by color flow Doppler.   Aortic Valve: The aortic valve was not well visualized Aortic valve  regurgitation was not visualized by color flow Doppler. There is No  stenosis of the aortic valve.   Pulmonic Valve: The pulmonic valve was not well visualized. Pulmonic valve  regurgitation is not visualized by color flow Doppler.   Aorta: The aorta is normal unless otherwise noted.   Compared to previous exam: 08/19/2016: LVEF 40-45%.   ----------------------------------------------------------------------------------------  A download of and for CPAP was obtained today from May 5 through September 24, 2019.  Compliance is excellent with 20% use and average usage 9 hours 13 minutes.  At 9 cm set of pressure, AHI 1.9/h  Labs/Other Tests and Data Reviewed:    EKG: ECG was done today but I personally reviewed her last ECG by me in October 2019 which showed normal sinus rhythm at 80 bpm and nonspecific ST changes  Recent Labs: No results found for requested labs within last 8760 hours.   Recent Lipid Panel Lab Results  Component Value Date/Time   CHOL 126 08/18/2016 08:27 AM   TRIG 178 (H) 08/18/2016 08:27 AM   HDL 39 (L) 08/18/2016 08:27 AM   CHOLHDL 3.2 08/18/2016 08:27 AM   LDLCALC 51 08/18/2016 08:27 AM    Wt Readings from Last 3 Encounters:  09/25/19 261 lb (  118.4 kg)  03/11/19 278 lb (126.1 kg)  02/17/18 282 lb 9.6 oz (128.2 kg)     Objective:    Vital Signs:  Ht 5\' 3"  (1.6 m)   Wt 261 lb (118.4 kg)   BMI 46.23 kg/m    Since this was a virtual visit I could not physically examine the patient Speech was normal There was no  wheezing He did not sound short of breath Palpation of pulse was regular rhythm Positive obesity Leg swelling bilaterally Rash    ASSESSMENT & PLAN:    1. Nonischemic cardiomyopathy: Most recent echo from September 2020 reviewed which showed EF 40 to 45%. 2. PAF: She continues to be on Eliquis and carvedilol.  No awareness of recurrent AF. 3. OSA: Excellent compliance with BiPAP therapy.  I reviewed her download from Aug 26, 2019 through September 24, 2019.  AHI excellent at 1.9 at 9 cm set pressure.  She is in need for new supplies.  She was uncertain as to her DME company since she has called company both in [place] in [place] without success.  We may need to hook her up with a new company so that supplies can be received.  We will contact Barry Brunner to arrange. 4. CKD: Stage IV: Followed closely by Dr. Edrick Oh. 5. Lower extremity edema: Continues to be on metolazone on Monday Wednesday and Friday in addition to torsemide 6. Hypothyroidism: On levothyroxine 100 mcg 7. Gout: Currently on Uloric and has been undergoing treatment with Krystexxa infusion. 8. Morbid obesity: There has been some weight loss with a BMI decreasing from 50 down to 46.2  COVID-19 Education: The signs and symptoms of COVID-19 were discussed with the patient and how to seek care for testing (follow up with PCP or arrange E-visit).  The importance of social distancing was discussed today.  Time:   Today, I have spent 25 minutes with the patient with telehealth technology discussing the above problems.     Medication Adjustments/Labs and Tests Ordered: Current medicines are reviewed at length with the patient today.  Concerns regarding medicines are outlined above.   Tests Ordered: No orders of the defined types were placed in this encounter.   Medication Changes: No orders of the defined types were placed in this encounter.   Follow Up: In office evaluation in 6 months  Signed, Shelva Majestic, MD   09/25/2019 4:19 PM    [facility]

## 2019-09-25 NOTE — Patient Instructions (Signed)

## 2019-09-27 ENCOUNTER — Encounter: Payer: Self-pay | Admitting: Cardiovascular Disease

## 2019-10-12 ENCOUNTER — Telehealth: Payer: Self-pay | Admitting: *Deleted

## 2019-10-12 NOTE — Telephone Encounter (Signed)
-----   Message from Freada Bergeron, [place] sent at 10/03/2019  1:33 PM EDT -----  ----- Message ----- From: June, Sarah O, RN Sent: 09/25/2019   5:04 PM EDT To: Lauralee Evener, CMA  Supposedly this pt was speaking with you regarding supplies? Her DME was bought out and she couldn't remember the name? If she can't get with her supply company we can refer her to choice.

## 2019-10-12 NOTE — Telephone Encounter (Signed)
Reached out to help order her supplies lmtcb on cell number.

## 2019-10-13 NOTE — Telephone Encounter (Signed)
DME is [facility] in [place]. Patient notified.

## 2019-10-13 NOTE — Telephone Encounter (Signed)
Reached out to [employer] at [place] in [place] who will assist the patient in getting her cpap supplies. Fax number [phone].

## 2019-10-14 NOTE — Telephone Encounter (Signed)
Patient called back to say the two [place] offices are bouncing her back and forth and no one is really helping her. I reached out the the [facility] office Danae Chen) who states her manager has left for the day and she will call back tomorrow when she has spoken to her manager about the matter. I then reached out to the [place] office [employer]) who says she has a billing statement showing payment has been made to the [place] office as early as 2020. I asked both offices to please call me on tomorrow with a solution for the patient. I expressed to both the [employer] that the patient has not had any supplies except for a few samples from [employer] in the last four years. This is unacceptable poor customer service to our patients.

## 2019-10-15 ENCOUNTER — Emergency Department (HOSPITAL_COMMUNITY): Payer: Medicare Other

## 2019-10-15 ENCOUNTER — Inpatient Hospital Stay (HOSPITAL_COMMUNITY)
Admission: RE | Admit: 2019-10-15 | Discharge: 2019-10-17 | DRG: 641 | Disposition: A | Discharge status: Home Health | Payer: Medicare Other | Attending: Internal Medicine | Admitting: Internal Medicine

## 2019-10-15 ENCOUNTER — Encounter (HOSPITAL_COMMUNITY): Payer: Self-pay | Admitting: Pharmacy Technician

## 2019-10-15 DIAGNOSIS — M6282 Rhabdomyolysis: Secondary | ICD-10-CM | POA: Diagnosis present

## 2019-10-15 DIAGNOSIS — I5022 Chronic systolic (congestive) heart failure: Secondary | ICD-10-CM | POA: Diagnosis present

## 2019-10-15 DIAGNOSIS — I959 Hypotension, unspecified: Secondary | ICD-10-CM | POA: Diagnosis present

## 2019-10-15 DIAGNOSIS — Y9301 Activity, walking, marching and hiking: Secondary | ICD-10-CM | POA: Diagnosis present

## 2019-10-15 DIAGNOSIS — K5900 Constipation, unspecified: Secondary | ICD-10-CM | POA: Diagnosis present

## 2019-10-15 DIAGNOSIS — Z79890 Hormone replacement therapy (postmenopausal): Secondary | ICD-10-CM

## 2019-10-15 DIAGNOSIS — M545 Low back pain: Secondary | ICD-10-CM | POA: Diagnosis present

## 2019-10-15 DIAGNOSIS — G4733 Obstructive sleep apnea (adult) (pediatric): Secondary | ICD-10-CM | POA: Diagnosis present

## 2019-10-15 DIAGNOSIS — I480 Paroxysmal atrial fibrillation: Secondary | ICD-10-CM | POA: Diagnosis present

## 2019-10-15 DIAGNOSIS — G8929 Other chronic pain: Secondary | ICD-10-CM | POA: Diagnosis present

## 2019-10-15 DIAGNOSIS — E039 Hypothyroidism, unspecified: Secondary | ICD-10-CM | POA: Diagnosis present

## 2019-10-15 DIAGNOSIS — Z888 Allergy status to other drugs, medicaments and biological substances status: Secondary | ICD-10-CM

## 2019-10-15 DIAGNOSIS — Z8249 Family history of ischemic heart disease and other diseases of the circulatory system: Secondary | ICD-10-CM

## 2019-10-15 DIAGNOSIS — E876 Hypokalemia: Secondary | ICD-10-CM | POA: Diagnosis present

## 2019-10-15 DIAGNOSIS — I130 Hypertensive heart and chronic kidney disease with heart failure and stage 1 through stage 4 chronic kidney disease, or unspecified chronic kidney disease: Secondary | ICD-10-CM | POA: Diagnosis present

## 2019-10-15 DIAGNOSIS — N184 Chronic kidney disease, stage 4 (severe): Secondary | ICD-10-CM | POA: Diagnosis present

## 2019-10-15 DIAGNOSIS — Y92009 Unspecified place in unspecified non-institutional (private) residence as the place of occurrence of the external cause: Secondary | ICD-10-CM

## 2019-10-15 DIAGNOSIS — N179 Acute kidney failure, unspecified: Secondary | ICD-10-CM | POA: Diagnosis present

## 2019-10-15 DIAGNOSIS — L930 Discoid lupus erythematosus: Secondary | ICD-10-CM | POA: Diagnosis present

## 2019-10-15 DIAGNOSIS — I5023 Acute on chronic systolic (congestive) heart failure: Secondary | ICD-10-CM | POA: Diagnosis present

## 2019-10-15 DIAGNOSIS — Z7901 Long term (current) use of anticoagulants: Secondary | ICD-10-CM

## 2019-10-15 DIAGNOSIS — W19XXXA Unspecified fall, initial encounter: Secondary | ICD-10-CM | POA: Diagnosis present

## 2019-10-15 DIAGNOSIS — I428 Other cardiomyopathies: Secondary | ICD-10-CM | POA: Diagnosis present

## 2019-10-15 DIAGNOSIS — E860 Dehydration: Secondary | ICD-10-CM | POA: Diagnosis present

## 2019-10-15 DIAGNOSIS — E861 Hypovolemia: Secondary | ICD-10-CM | POA: Diagnosis present

## 2019-10-15 DIAGNOSIS — Z91048 Other nonmedicinal substance allergy status: Secondary | ICD-10-CM

## 2019-10-15 DIAGNOSIS — M1030 Gout due to renal impairment, unspecified site: Secondary | ICD-10-CM | POA: Diagnosis present

## 2019-10-15 DIAGNOSIS — Z79899 Other long term (current) drug therapy: Secondary | ICD-10-CM

## 2019-10-15 DIAGNOSIS — Z6841 Body Mass Index (BMI) 40.0 and over, adult: Secondary | ICD-10-CM

## 2019-10-15 DIAGNOSIS — M25552 Pain in left hip: Secondary | ICD-10-CM | POA: Diagnosis present

## 2019-10-15 DIAGNOSIS — Z20822 Contact with and (suspected) exposure to covid-19: Secondary | ICD-10-CM | POA: Diagnosis present

## 2019-10-15 DIAGNOSIS — R799 Abnormal finding of blood chemistry, unspecified: Secondary | ICD-10-CM

## 2019-10-15 DIAGNOSIS — T1490XA Injury, unspecified, initial encounter: Secondary | ICD-10-CM

## 2019-10-15 LAB — BASIC METABOLIC PANEL
Anion gap: 17 — ABNORMAL HIGH (ref 5–15)
BUN: 123 mg/dL — ABNORMAL HIGH (ref 8–23)
CO2: 29 mmol/L (ref 22–32)
Calcium: 9.1 mg/dL (ref 8.9–10.3)
Chloride: 89 mmol/L — ABNORMAL LOW (ref 98–111)
Creatinine, Ser: 3.09 mg/dL — ABNORMAL HIGH (ref 0.44–1.00)
GFR calc Af Amer: 18 mL/min — ABNORMAL LOW (ref >60)
GFR calc non Af Amer: 15 mL/min — ABNORMAL LOW (ref >60)
Glucose, Bld: 128 mg/dL — ABNORMAL HIGH (ref 70–99)
Potassium: 2.2 mmol/L — CL (ref 3.5–5.1)
Sodium: 135 mmol/L (ref 135–145)

## 2019-10-15 LAB — COMPREHENSIVE METABOLIC PANEL
ALT: 34 U/L (ref 0–44)
AST: 39 U/L (ref 15–41)
Albumin: 4.3 g/dL (ref 3.5–5.0)
Alkaline Phosphatase: 71 U/L (ref 38–126)
Anion gap: 19 — ABNORMAL HIGH (ref 5–15)
BUN: 137 mg/dL — ABNORMAL HIGH (ref 8–23)
CO2: 30 mmol/L (ref 22–32)
Calcium: 9.7 mg/dL (ref 8.9–10.3)
Chloride: 87 mmol/L — ABNORMAL LOW (ref 98–111)
Creatinine, Ser: 3.34 mg/dL — ABNORMAL HIGH (ref 0.44–1.00)
GFR calc Af Amer: 16 mL/min — ABNORMAL LOW (ref >60)
GFR calc non Af Amer: 14 mL/min — ABNORMAL LOW (ref >60)
Glucose, Bld: 136 mg/dL — ABNORMAL HIGH (ref 70–99)
Potassium: 2.6 mmol/L — CL (ref 3.5–5.1)
Sodium: 136 mmol/L (ref 135–145)
Total Bilirubin: 1.2 mg/dL (ref 0.3–1.2)
Total Protein: 7.6 g/dL (ref 6.5–8.1)

## 2019-10-15 LAB — URINALYSIS, ROUTINE W REFLEX MICROSCOPIC
Bilirubin Urine: NEGATIVE
Glucose, UA: NEGATIVE mg/dL
Ketones, ur: NEGATIVE mg/dL
Leukocytes,Ua: NEGATIVE
Nitrite: NEGATIVE
Protein, ur: NEGATIVE mg/dL
Specific Gravity, Urine: 1.008 (ref 1.005–1.030)
pH: 6 (ref 5.0–8.0)

## 2019-10-15 LAB — PROTIME-INR
INR: 1.7 — ABNORMAL HIGH (ref 0.8–1.2)
Prothrombin Time: 19 seconds — ABNORMAL HIGH (ref 11.4–15.2)

## 2019-10-15 LAB — HIV ANTIBODY (ROUTINE TESTING W REFLEX): HIV Screen 4th Generation wRfx: NONREACTIVE

## 2019-10-15 LAB — I-STAT CHEM 8, ED
BUN: >130 mg/dL — ABNORMAL HIGH (ref 8–23)
Calcium, Ion: 1.02 mmol/L — ABNORMAL LOW (ref 1.15–1.40)
Chloride: 85 mmol/L — ABNORMAL LOW (ref 98–111)
Creatinine, Ser: 3.7 mg/dL — ABNORMAL HIGH (ref 0.44–1.00)
Glucose, Bld: 138 mg/dL — ABNORMAL HIGH (ref 70–99)
HCT: 39 % (ref 36.0–46.0)
Hemoglobin: 13.3 g/dL (ref 12.0–15.0)
Potassium: 2.7 mmol/L — CL (ref 3.5–5.1)
Sodium: 135 mmol/L (ref 135–145)
TCO2: 37 mmol/L — ABNORMAL HIGH (ref 22–32)

## 2019-10-15 LAB — ETHANOL: Alcohol, Ethyl (B): <10 mg/dL (ref <10)

## 2019-10-15 LAB — SARS CORONAVIRUS 2 BY RT PCR (HOSPITAL ORDER, PERFORMED IN ~~LOC~~ HOSPITAL LAB): SARS Coronavirus 2: NEGATIVE

## 2019-10-15 LAB — CBC
HCT: 38.9 % (ref 36.0–46.0)
Hemoglobin: 13.1 g/dL (ref 12.0–15.0)
MCH: 30.3 pg (ref 26.0–34.0)
MCHC: 33.7 g/dL (ref 30.0–36.0)
MCV: 89.8 fL (ref 80.0–100.0)
Platelets: 355 10*3/uL (ref 150–400)
RBC: 4.33 MIL/uL (ref 3.87–5.11)
RDW: 13.4 % (ref 11.5–15.5)
WBC: 6.7 10*3/uL (ref 4.0–10.5)
nRBC: 0 % (ref 0.0–0.2)

## 2019-10-15 LAB — LACTIC ACID, PLASMA: Lactic Acid, Venous: 2 mmol/L (ref 0.5–1.9)

## 2019-10-15 LAB — TSH: TSH: 0.688 u[IU]/mL (ref 0.350–4.500)

## 2019-10-15 LAB — MAGNESIUM: Magnesium: 1.2 mg/dL — ABNORMAL LOW (ref 1.7–2.4)

## 2019-10-15 MED ORDER — POTASSIUM CHLORIDE CRYS ER 20 MEQ PO TBCR
40.0000 meq | EXTENDED_RELEASE_TABLET | ORAL | Status: AC
  Administered 2019-10-15 – 2019-10-16 (×2): 40 meq via ORAL
  Filled 2019-10-15: qty 2

## 2019-10-15 MED ORDER — ACETAMINOPHEN 325 MG PO TABS
650.0000 mg | ORAL_TABLET | Freq: Four times a day (QID) | ORAL | Status: DC | PRN
  Administered 2019-10-16 (×2): 650 mg via ORAL
  Filled 2019-10-15 (×2): qty 2

## 2019-10-15 MED ORDER — SODIUM CHLORIDE 0.9 % IV BOLUS
500.0000 mL | Freq: Once | INTRAVENOUS | Status: AC
  Administered 2019-10-15: 500 mL via INTRAVENOUS

## 2019-10-15 MED ORDER — OXYCODONE HCL 5 MG PO TABS
5.0000 mg | ORAL_TABLET | Freq: Once | ORAL | Status: AC
  Administered 2019-10-15: 5 mg via ORAL
  Filled 2019-10-15: qty 1

## 2019-10-15 MED ORDER — ALBUTEROL SULFATE (2.5 MG/3ML) 0.083% IN NEBU: 2.5000 mg | INHALATION_SOLUTION | Freq: Every day | RESPIRATORY_TRACT | Status: DC | PRN

## 2019-10-15 MED ORDER — BUPRENORPHINE HCL 900 MCG BU FILM: 1.0000 | ORAL_FILM | Freq: Two times a day (BID) | BUCCAL | Status: DC | PRN

## 2019-10-15 MED ORDER — MAGNESIUM SULFATE 4 GM/100ML IV SOLN
4.0000 g | Freq: Once | INTRAVENOUS | Status: AC
  Administered 2019-10-15: 4 g via INTRAVENOUS
  Filled 2019-10-15: qty 100

## 2019-10-15 MED ORDER — POTASSIUM CHLORIDE CRYS ER 20 MEQ PO TBCR
40.0000 meq | EXTENDED_RELEASE_TABLET | Freq: Once | ORAL | Status: AC
  Administered 2019-10-15: 40 meq via ORAL
  Filled 2019-10-15: qty 2

## 2019-10-15 MED ORDER — POTASSIUM CHLORIDE 10 MEQ/100ML IV SOLN: 10.0000 meq | INTRAVENOUS | Status: DC

## 2019-10-15 MED ORDER — POTASSIUM CHLORIDE 10 MEQ/100ML IV SOLN
10.0000 meq | Freq: Once | INTRAVENOUS | Status: AC
  Administered 2019-10-15: 10 meq via INTRAVENOUS
  Filled 2019-10-15: qty 100

## 2019-10-15 MED ORDER — SENNOSIDES-DOCUSATE SODIUM 8.6-50 MG PO TABS: 1.0000 | ORAL_TABLET | Freq: Every evening | ORAL | Status: DC | PRN

## 2019-10-15 MED ORDER — ATORVASTATIN CALCIUM 40 MG PO TABS
40.0000 mg | ORAL_TABLET | Freq: Every day | ORAL | Status: DC
  Administered 2019-10-16: 40 mg via ORAL
  Filled 2019-10-15: qty 1

## 2019-10-15 MED ORDER — APIXABAN 5 MG PO TABS
5.0000 mg | ORAL_TABLET | Freq: Two times a day (BID) | ORAL | Status: DC
  Administered 2019-10-15 – 2019-10-17 (×4): 5 mg via ORAL
  Filled 2019-10-15 (×4): qty 1

## 2019-10-15 MED ORDER — POTASSIUM CHLORIDE 2 MEQ/ML IV SOLN
INTRAVENOUS | Status: AC
  Filled 2019-10-15 (×2): qty 1000

## 2019-10-15 MED ORDER — POTASSIUM CHLORIDE IN NACL 20-0.9 MEQ/L-% IV SOLN: INTRAVENOUS | Status: DC

## 2019-10-15 MED ORDER — ACETAMINOPHEN 650 MG RE SUPP: 650.0000 mg | Freq: Four times a day (QID) | RECTAL | Status: DC | PRN

## 2019-10-15 MED ORDER — BUPRENORPHINE HCL 900 MCG BU FILM: 1.0000 | ORAL_FILM | Freq: Two times a day (BID) | BUCCAL | Status: DC

## 2019-10-15 MED ORDER — LEVOTHYROXINE SODIUM 100 MCG PO TABS
100.0000 ug | ORAL_TABLET | Freq: Every day | ORAL | Status: DC
  Administered 2019-10-16 – 2019-10-17 (×2): 100 ug via ORAL
  Filled 2019-10-15 (×2): qty 1

## 2019-10-15 NOTE — Telephone Encounter (Signed)
Erica at [facility] called back this morning and said they will take care of the patients cpap supplies. They have already reached out to her this morning to get the correct mask she needs and will send a medical of necessity over to be signed to get her on a supply rotation.

## 2019-10-15 NOTE — ED Triage Notes (Signed)
Pt bib ems from home after mechanical fall. Per ems, pt with weakness X3 days and today her "legs gave out". Pt hit L side of head on a door, no LOC. Pt on eliquis for afib, last dose this morning. Pt also c/o pain to L hip but was able to stand with assistance. R shoulder pain. Full ROM. Denies neck/back pain.  128/74 HR 104 GCS 15 18g LAC.

## 2019-10-15 NOTE — ED Notes (Signed)
Dinner ordered 

## 2019-10-15 NOTE — Progress Notes (Signed)
Orthopedic Tech Progress Note Patient Details:  Lisaann Atha 1955-08-24 [id number] Level 2 trauma Patient ID: Lars Mage, female   DOB: [birth year], 64 y.o.   MRN: [id number]   Ellouise Newer 10/15/2019, 12:05 PM

## 2019-10-15 NOTE — Progress Notes (Signed)
Responded to level 2 page to support patient . Patient came in after a fall.  Daughter at bedside. Provided emotional and spiritual support to patient and family at bedside.  Will follow as needed.  Jaclynn Major, [place], [facility], Pager [phone]

## 2019-10-15 NOTE — ED Provider Notes (Signed)
[facility] EMERGENCY DEPARTMENT Provider Note   CSN: [id number] Arrival date & time: 10/15/19  1149     History Chief Complaint  Patient presents with  . Fall  . Weakness    Caitlyn Fuentes is a 64 y.o. female.  HPI    64 year old female with a history of nonischemic cardiomyopathy, atrial fibrillation on Eliquis, CKD stage IV, OSA, hypothyroidism, presents with concern for generalized weakness and fall.  Reports over the last several weeks, she has had increasing generalized weakness.  She has had decreased appetite, reporting that her food does not taste the same as it used to, occasional episodes of nausea and vomiting.  Reports her shortness of breath and orthopnea is at baseline.  Reports that her lower extremity edema is improving.  She is taking her metolazone and torsemide and has not had any recent changes in medication.  Denies fever, cough, chest pain, recent black or bloody stools.  Reports that over the last several weeks has had alternating constipation with bright red blood after straining to have a bowel movement, as well as diarrhea after taking medications for her constipation.  Denies abdominal pain.  Reports that she has not been feeling well over the last month, but it has worsened over the last 2 to 3 days.  Today, when she was attempting to grab her walker as she was walking out of the bathroom, she fell, hitting her right elbow on the door, and landing on her left side.  She thinks she hit her head but she is not sure on what.  She does not think she lost consciousness, just reports that the fall happened so fast and she is not sure what happened.  Sees Dr. Sheryle [facility] and Dr. Justin Mend of Nephrology  nonischemic cardiomyopathy, atrial fibrillation on Eliquis, CKD stage IV, OSA, hypothyroidism   There are no problems to display for this patient.   OB History   No obstetric history on file.     No family history on  file.  Social History   Tobacco Use  . Smoking status: Not on file  Substance Use Topics  . Alcohol use: Not on file  . Drug use: Not on file    Home Medications Prior to Admission medications   Medication Sig Start Date End Date Taking? Authorizing Provider  Buprenorphine HCl (BELBUCA) 900 MCG FILM Place 1 Film inside cheek in the morning and at bedtime.   Yes [provider]  ELIQUIS 5 MG TABS tablet Take 5 mg by mouth 2 (two) times daily. t 1000 07/20/19  Yes [provider]  HYDROcodone-acetaminophen (NORCO/VICODIN) 5-325 MG tablet Take 1 tablet by mouth 3 (three) times daily as needed.   Yes [provider]  metolazone (ZAROXOLYN) 2.5 MG tablet Take 2.5 mg by mouth See admin instructions. Take 2.5 mg by mouth   Yes [provider]  predniSONE (DELTASONE) 5 MG tablet Take 5 mg by mouth daily with breakfast. 12 day stst last week   Yes [provider]  atorvastatin (LIPITOR) 40 MG tablet Take 40 mg by mouth at bedtime. yest 09/27/19   [provider]  Azelaic Acid 15 % cream Apply topically 2 (two) times daily as needed. aaa face 05/28/19   [provider]    Allergies    Lisinopril, Tape, and Diovan [valsartan]  Review of Systems   Review of Systems  Constitutional: Positive for appetite change, fatigue and unexpected weight change. Negative for fever.  HENT: Negative  for sore throat.   Eyes: Negative for visual disturbance.  Respiratory: Positive for shortness of breath (chronic unchanged). Negative for cough.   Cardiovascular: Negative for chest pain.  Gastrointestinal: Negative for abdominal pain, nausea and vomiting.  Genitourinary: Negative for difficulty urinating and dysuria.  Musculoskeletal: Positive for arthralgias. Negative for back pain and neck pain.  Skin: Negative for rash.  Neurological: Positive for headaches. Negative for syncope. Numbness: tingling just distal to right elbow.    Physical  Exam Updated Vital Signs BP 112/66   Pulse 93   Temp (!) 96.2 F (35.7 C) (Temporal)   Resp 14   SpO2 93%   Physical Exam Vitals and nursing note reviewed.  Constitutional:      General: She is not in acute distress.    Appearance: She is well-developed. She is not diaphoretic.  HENT:     Head: Normocephalic and atraumatic.  Eyes:     Conjunctiva/sclera: Conjunctivae normal.  Cardiovascular:     Rate and Rhythm: Normal rate and regular rhythm.     Heart sounds: Normal heart sounds. No murmur heard.  No friction rub. No gallop.   Pulmonary:     Effort: Pulmonary effort is normal. No respiratory distress.     Breath sounds: Normal breath sounds. No wheezing or rales.  Abdominal:     General: There is no distension.     Palpations: Abdomen is soft.     Tenderness: There is no abdominal tenderness. There is no guarding.  Musculoskeletal:        General: Tenderness (left hip, buttock) present.     Cervical back: Normal range of motion.     Comments: Tenderness right elbow,tingling small area distal to elbow ulnar side of arm  Skin:    General: Skin is warm and dry.     Findings: No erythema or rash.     Comments: Contusion left hip  Neurological:     Mental Status: She is alert and oriented to person, place, and time.     ED Results / Procedures / Treatments   Labs (all labs ordered are listed, but only abnormal results are displayed) Labs Reviewed  COMPREHENSIVE METABOLIC PANEL - Abnormal; Notable for the following components:      Result Value   Potassium 2.6 (*)    Chloride 87 (*)    Glucose, Bld 136 (*)    BUN 137 (*)    Creatinine, Ser 3.34 (*)    GFR calc non Af Amer 14 (*)    GFR calc Af Amer 16 (*)    Anion gap 19 (*)    All other components within normal limits  LACTIC ACID, PLASMA - Abnormal; Notable for the following components:   Lactic Acid, Venous 2.0 (*)    All other components within normal limits  PROTIME-INR - Abnormal; Notable for the  following components:   Prothrombin Time 19.0 (*)    INR 1.7 (*)    All other components within normal limits  I-STAT CHEM 8, ED - Abnormal; Notable for the following components:   Potassium 2.7 (*)    Chloride 85 (*)    BUN >130 (*)    Creatinine, Ser 3.70 (*)    Glucose, Bld 138 (*)    Calcium, Ion 1.02 (*)    TCO2 37 (*)    All other components within normal limits  SARS CORONAVIRUS 2 BY RT PCR (HOSPITAL ORDER, [facility])  CBC  ETHANOL  URINALYSIS, ROUTINE W  REFLEX MICROSCOPIC  SAMPLE TO BLOOD BANK    EKG EKG Interpretation  Date/Time:  Thursday October 15 2019 12:02:27 EDT Ventricular Rate:  105 PR Interval:    QRS Duration: 91 QT Interval:  296 QTC Calculation: 392 R Axis:   -6 Text Interpretation: Sinus tachycardia LVH with secondary repolarization abnormality No previous ECGs available Confirmed by Gareth Morgan [phone]) on 10/15/2019 12:26:50 PM   Radiology CT HEAD WO CONTRAST  Result Date: 10/15/2019 CLINICAL DATA:  Left-sided head and neck pain after fall EXAM: CT HEAD WITHOUT CONTRAST CT CERVICAL SPINE WITHOUT CONTRAST TECHNIQUE: Multidetector CT imaging of the head and cervical spine was performed following the standard protocol without intravenous contrast. Multiplanar CT image reconstructions of the cervical spine were also generated. COMPARISON:  08/17/2016, 10/30/2016 FINDINGS: CT HEAD FINDINGS Brain: No evidence of acute infarction, hemorrhage, hydrocephalus, extra-axial collection or mass lesion/mass effect. Vascular: Atherosclerotic calcifications involving the large vessels of the skull base. No unexpected hyperdense vessel. Skull: Normal. Negative for fracture or focal lesion. Sinuses/Orbits: No acute finding. Other: None. CT CERVICAL SPINE FINDINGS Alignment: Facet joints are aligned without dislocation. Dens and lateral masses are aligned. No traumatic listhesis. Skull base and vertebrae: No acute fracture. No primary bone lesion or  focal pathologic process. Soft tissues and spinal canal: No prevertebral fluid or swelling. No visible canal hematoma. Disc levels: Intervertebral disc height loss most pronounced at C5-6 and C6-7. Mild bilateral facet and uncovertebral arthropathy. Upper chest: Visualized lung apices are clear. Other: None. IMPRESSION: 1. No acute intracranial findings. 2. No acute fracture or traumatic listhesis of the cervical spine. Electronically Signed   By: Davina Poke D.O.   On: 10/15/2019 13:46   CT Cervical Spine Wo Contrast  Result Date: 10/15/2019 CLINICAL DATA:  Left-sided head and neck pain after fall EXAM: CT HEAD WITHOUT CONTRAST CT CERVICAL SPINE WITHOUT CONTRAST TECHNIQUE: Multidetector CT imaging of the head and cervical spine was performed following the standard protocol without intravenous contrast. Multiplanar CT image reconstructions of the cervical spine were also generated. COMPARISON:  08/17/2016, 10/30/2016 FINDINGS: CT HEAD FINDINGS Brain: No evidence of acute infarction, hemorrhage, hydrocephalus, extra-axial collection or mass lesion/mass effect. Vascular: Atherosclerotic calcifications involving the large vessels of the skull base. No unexpected hyperdense vessel. Skull: Normal. Negative for fracture or focal lesion. Sinuses/Orbits: No acute finding. Other: None. CT CERVICAL SPINE FINDINGS Alignment: Facet joints are aligned without dislocation. Dens and lateral masses are aligned. No traumatic listhesis. Skull base and vertebrae: No acute fracture. No primary bone lesion or focal pathologic process. Soft tissues and spinal canal: No prevertebral fluid or swelling. No visible canal hematoma. Disc levels: Intervertebral disc height loss most pronounced at C5-6 and C6-7. Mild bilateral facet and uncovertebral arthropathy. Upper chest: Visualized lung apices are clear. Other: None. IMPRESSION: 1. No acute intracranial findings. 2. No acute fracture or traumatic listhesis of the cervical spine.  Electronically Signed   By: Davina Poke D.O.   On: 10/15/2019 13:46   CT PELVIS WO CONTRAST  Result Date: 10/15/2019 CLINICAL DATA:  Recent fall with hip pain and weakness, initial encounter EXAM: CT PELVIS WITHOUT CONTRAST TECHNIQUE: Multidetector CT imaging of the pelvis was performed following the standard protocol without intravenous contrast. COMPARISON:  Plain film from earlier in the same day. FINDINGS: Urinary Tract:  Bladder is well distended. Bowel: The appendix is within normal limits. No obstructive or inflammatory changes are seen. Vascular/Lymphatic: Atherosclerotic calcifications are noted without aneurysmal dilatation. No lymphadenopathy is seen. Reproductive: Uterus has been surgically  removed. No adnexal mass is noted. Other:  No free pelvic fluid is seen. Musculoskeletal: Degenerative changes of the lower lumbar spine are seen. Chronic appearing erosive changes of the sacroiliac joints are noted bilaterally with surrounding sclerosis. These changes are slightly more progressed on the left than the right. No acute fracture is noted. Degenerative widening of the pubic symphysis is seen as well. IMPRESSION: Chronic appearing erosive changes involving the sacroiliac joints bilaterally with surrounding sclerosis. This likely represents chronic sacroiliitis. No acute fracture is seen. No associated soft tissue abnormality is noted. No hip fracture is noted. Mild widening of the pubic symphysis without acute bony abnormality. This is also likely related to chronic inflammatory change. Aortic Atherosclerosis (ICD10-I70.0). Electronically Signed   By: Inez Catalina M.D.   On: 10/15/2019 14:13   DG Pelvis Portable  Result Date: 10/15/2019 CLINICAL DATA:  Fall on blood thinners EXAM: PORTABLE PELVIS 1-2 VIEWS COMPARISON:  CT abdomen and pelvis of 2015 FINDINGS: Portable technique and patient body habitus limiting assessment. Lucency about the LEFT SI joint with sclerotic margin. Similar changes  on the RIGHT to a lesser extent. Widening of the pubic symphysis as well. No displaced fracture otherwise. Spinal degenerative changes. IMPRESSION: Pubic symphysis widening and asymmetric widening of the LEFT SI joint with sclerotic margin. Most of the findings appear chronic. Most of the findings do appear chronic, however given widening of symphyseal elements and sacroiliac joints in the setting of trauma acute on chronic process is not excluded. These findings were not present on the 2015 CT. Differential diagnosis for the chronic component of the above findings would include inflammatory sacroiliitis with asymmetry or indolent infection involving the sacroiliac joints. CT is suggested for further evaluation. No additional bone abnormalities on plain film radiograph. Electronically Signed   By: Zetta Bills M.D.   On: 10/15/2019 12:29   DG Chest Port 1 View  Result Date: 10/15/2019 CLINICAL DATA:  Fall, patient on blood thinners EXAM: PORTABLE CHEST 1 VIEW COMPARISON:  08/17/2016 FINDINGS: Cardiomediastinal contours and hilar structures are normal. Lungs are clear. No sign of pleural effusion. Study limited by body habitus and portable technique. Limited assessment of skeletal structures without acute finding. IMPRESSION: No acute cardiopulmonary disease. Electronically Signed   By: Zetta Bills M.D.   On: 10/15/2019 12:18   DG Hip Unilat W or Wo Pelvis 2-3 Views Left  Result Date: 10/15/2019 CLINICAL DATA:  Left hip pain after a fall today. EXAM: DG HIP (WITH OR WITHOUT PELVIS) 2-3V LEFT COMPARISON:  Pelvic radiograph dated 10/15/2019 FINDINGS: There is no evidence of hip fracture or dislocation. There is no evidence of arthropathy or other focal bone abnormality. IMPRESSION: Negative. Electronically Signed   By: Lorriane Shire M.D.   On: 10/15/2019 13:08    Procedures Procedures (including critical care time)  Medications Ordered in ED Medications  potassium chloride 10 mEq in 100 mL IVPB  (10 mEq Intravenous New Bag/Given 10/15/19 1435)  sodium chloride 0.9 % bolus 500 mL (has no administration in time range)  oxyCODONE (Oxy IR/ROXICODONE) immediate release tablet 5 mg (has no administration in time range)  potassium chloride SA (KLOR-CON) CR tablet 40 mEq (40 mEq Oral Given 10/15/19 1435)    ED Course  I have reviewed the triage vital signs and the nursing notes.  Pertinent labs & imaging results that were available during my care of the patient were reviewed by me and considered in my medical decision making (see chart for details).    MDM Rules/Calculators/A&P  64 year old female with a history of nonischemic cardiomyopathy, atrial fibrillation on Eliquis, CKD stage IV, OSA, hypothyroidism, presents with concern for generalized weakness and fall.  She arrives as a level 2 trauma for fall while on anticoagulation.  She has mild tachycardia, without other significant vital sign abnormalities.  Labs are significant for hypokalemia, and acute on chronic kidney injury with a creatinine on i-STAT labs of 3.7.  CMP returned showing a potassium of 2.6, a BUN of 137 and a creatinine of 3.34 from previous of 2.03 on 09/15/2019 on [facility].  Anion gap 19 with bicarb of 30.   X-ray shows no significant findings on chest x-ray, no signs of CHF or trauma.  Pelvis x-ray shows lucency about the left SI joint with a sclerotic margin.  Unclear if this is chronic or related to trauma. WIll order CT pelvis for further characterization. If chronic may be inflammatory or indolent infection.  CT head, cervical spine and pelvis shows no evidence of acute fracture or intracranial hemorrhage.  Does have changes of chronic sacroilitis.  Suspect acute on chronic kidney injury secondary to dehydration in setting of low appetite, diuretic use, and occasional vomiting.  Given 500 cc of normal saline and will admit to internal medicine service for further care.    Final  Clinical Impression(s) / ED Diagnoses Final diagnoses:  Trauma  Fall, initial encounter  Chronic left hip pain  Acute renal failure superimposed on chronic kidney disease, unspecified CKD stage, unspecified acute renal failure type ([place])  Elevated BUN  Hypokalemia    Rx / DC Orders ED Discharge Orders    None       Gareth Morgan, MD 10/15/19 1533

## 2019-10-15 NOTE — ED Notes (Signed)
Attempted to call report to 2W 

## 2019-10-15 NOTE — H&P (Signed)
Date: 10/15/2019               Patient Name:  Caitlyn Fuentes MRN: [id number]  DOB: [birth year] Age / Sex: 64 y.o., female   PCP: Patient, No Pcp Per         Medical Service: Internal Medicine Teaching Service         Attending Physician: Dr. Lucious Groves, DO    First Contact: Dr. Harvie Heck Pager: [phone]  Second Contact: Dr. Gilberto Better Pager: [phone]       After Hours (After 5p/  First Contact Pager: [phone]  weekends / holidays): Second Contact Pager: [phone]   Chief Complaint: Fall  History of Present Illness: * Caitlyn Fuentes is a 64 yo F w/ PMH of CKD4, OSA, Hypothyroidism, A.Fib on Eliquis, non-ischemic cardiomyopathy presenting to [facility] w/ fall. She was in her usual state of health until this morning when she fell while attempting grab her walker while walking out of the bathroom. Her legs gave way and she fell backwards, primarily landing on her left hip on her way down. She mentions that she also hit the back of her head on her way down but denies any loss of consciousness. She was down for about 30 minutes until her husband found her and called EMS. She denies any nausea, vomiting, diarrhea, fevers or chills. She denies any tongue-biting, bowel/urine incontinence.  She mentions that she has been gradually developing worsening bilateral lower extremity weakness over the course of last week without any obvious inciting events.. She is taking all of her medications as prescribed including her torsemide but has reduced the dose of her potassium supplementation as she began to ran out of her meds and wasn't sure when she would get her refills so she felt the need to ration them. She mentions that her legs have 'never looked skinnier.'  In the ED, she was found to have K of 2.6 with AKI. She was started on IV and oral potassium repletion. IMTS was consulted for admission.  Meds: Current Meds  Medication Sig  . acetaminophen (TYLENOL) 500 MG tablet Take 500-1,000 mg by  mouth every 6 (six) hours as needed for mild pain or headache.  . Albuterol Sulfate (PROAIR RESPICLICK) 465 (90 Base) MCG/ACT AEPB Inhale 2 puffs into the lungs daily as needed (for shortness of brath or wheezing).  Marland Kitchen atorvastatin (LIPITOR) 40 MG tablet Take 40 mg by mouth at bedtime.   . Azelaic Acid 15 % cream Apply 1 application topically 2 (two) times daily as needed (to affected areas of the face).   . Buprenorphine HCl (BELBUCA) 900 MCG FILM Place 1 Film inside cheek in the morning and at bedtime.  . carvedilol (COREG CR) 80 MG 24 hr capsule Take 80 mg by mouth daily.  . cyclobenzaprine (FLEXERIL) 10 MG tablet Take 10 mg by mouth 3 (three) times daily as needed for muscle spasms.   Marland Kitchen doxepin (SINEQUAN) 50 MG capsule Take 50 mg by mouth at bedtime.  Marland Kitchen ELIQUIS 5 MG TABS tablet Take 5 mg by mouth 2 (two) times daily.   Marland Kitchen estradiol (ESTRACE) 2 MG tablet Take 2 mg by mouth daily.  . febuxostat (ULORIC) 40 MG tablet Take 40 mg by mouth in the morning.  . gabapentin (NEURONTIN) 300 MG capsule Take 300 mg by mouth 3 (three) times daily.  Marland Kitchen HYDROcodone-acetaminophen (NORCO/VICODIN) 5-325 MG tablet Take 1 tablet by mouth 3 (three) times daily as needed (for pain).   Marland Kitchen levothyroxine (  SYNTHROID) 100 MCG tablet Take 100 mcg by mouth daily before breakfast.  . metolazone (ZAROXOLYN) 2.5 MG tablet Take 2.5 mg by mouth See admin instructions. Take 2.5 mg by mouth three times a week as needed for fluid retention  . phentermine (ADIPEX-P) 37.5 MG tablet Take 18.75 mg by mouth daily.  . predniSONE (DELTASONE) 5 MG tablet Take 5-30 mg by mouth See admin instructions. Take 30 mg by mouth once daily on days 1 & 2, 25 mg once daily on days 3 & 4, 20 mg once daily on days 5 & 6, 15 mg once daily on days 7 & 8, 10 mg once daily on days 9 & 10, and 5 mg once daily on days 11 & 12 to complete a 12-day's course  . torsemide (DEMADEX) 20 MG tablet Take 40 mg by mouth 2 (two) times daily.   Allergies: Allergies as of  10/15/2019 - Review Complete 10/15/2019  Allergen Reaction Noted  . Lisinopril Shortness Of Breath, Swelling, and Other (See Comments) 10/15/2019  . Tape Rash and Other (See Comments) 10/15/2019  . Diovan [valsartan] Other (See Comments) 10/15/2019  . Zaroxolyn [metolazone] Other (See Comments) 10/15/2019   Past Medical History:  Diagnosis Date  . A-fib ([place])   . Renal disorder    Family History:  Multiple family members on her mother's side with early heart attack in her 25s.  Social History:  Lives with husband. Daughter is local. Denies any alcohol, tobacco, smoking history.  Review of Systems: A complete ROS was negative except as per HPI.  Physical Exam: Blood pressure 123/62, pulse 98, temperature 97.6 F (36.4 C), temperature source Oral, resp. rate 19, height 5\' 4"  (1.626 m), weight 117.9 kg, SpO2 97 %.  Gen: Well-developed, well nourished, NAD HEENT: Small tender, non-erythematous area on posterior head, Dry mucous membranes, EOMI Neck: supple, ROM intact, no JVD CV: RRR, S1, S2 normal, No rubs, no murmurs, no gallops Pulm: CTAB, No rales, no wheezes Abd: Soft, BS+, NTND, No rebound, no guarding Extm: ROM intact, Peripheral pulses intact, no pitting edema Skin: Dry, Warm, poor turgor Neurologic exam: Mental status: A&Ox3 Cranial Nerves:             II: PERRL             III, IV, VI: Extra-occular motions intact bilaterally             V, VII: Face symmetric, sensation intact in all 3 divisions               VIII: hearing normal to rubbing fingers bilaterally               IX, X: palate rises symmetrically             XI: Head turn and shoulder shrug normal bilaterally               XII: tongue midline    Motor: Strength 4/5 bilateral upper extremities, Strength 3/5 bilateral lower extremities Sensory: Light touch intact and symmetric bilaterally  Coordination: There is no dysmetria on finger-to-nose Psychiatric: Normal mood and affect  EKG: personally reviewed  my interpretation is sinus tachycardia, no ischemic changes  CXR: personally reviewed my interpretation is poor penetration, poor inspiratory efforts, no pleural effusion, no lobar consolidation  Assessment & Plan by Problem: Active Problems:   Hypokalemia  Ms.[place] is a 64 yo F w/ PMH of CKD4, OSA, Hypothyroidism, A.Fib on Eliquis, non-ischemic cardiomyopathy, systolic HF (EF 48-25%) admit for hypokalemia  Fall, Generalized Weakness 2/2 hypokalemia Chronic systolic heart failure Presented after fall on Eliquis. No evidence of fracture or bleed. Found to have K of 2.6. Possibly in setting of over-diuresis. On torsemide 40mg  BID and metolazone 2.5mg  PRN. Last TTE in 2019 w/ EF 40-45% w/ indeterminate diastolic dysfunction. Given 500cc bolus and receiving repletion in ED. Appear dry on exam. Admit Bp 106/89, HR 103 - Orthostatic Vitals - Hold diuretics, anti-hypertensives - Check mag - Replete K, mag if needed - 500cc bolus - PT/OT eval  Polypharmacy Chronic low back pain Chronic opioid therapy On multiple central acting meds for chronic back pain including gabapentin, norco, flexeril, buprenorphine, doxepin. Also on presentation had AKI, concerning for polypharmacy in addition to pronounced effectiveness of renally cleared meds. Mentation at baseline. - Hold central acting meds  AKI on CKD3a Acute Kidney Injury (creatinine 3.34) Chart review shows prior renal fx w/ CKD 3a (BUN 16, Creatinine 1.19). Likely in AKI due to overdiuresis. Will monitor after fluid resuscitataion - Trend renal fx - Avoid nephrotoxic meds when able  Hypothyroidism On levothyroxine 15mcg at home - Check TSH - C/w home med dose for now  Paroxysmal A.Fib on Eliquis CHADSVASC score of 3 due to age, chf, htn. No evidence of bleed after fall. Hgb stable. Current HR 100-110.  - Hold beta-blocker in setting of hypotension - C/w home meds: Eliquis  Dispo: Admit patient to Observation with expected length  of stay less than 2 midnights.  Signed: Mosetta Anis, MD 10/15/2019, 6:42 PM  Pager: [phone] After 5pm on weekdays and 1pm on weekends: On Call Pager: [phone]

## 2019-10-16 ENCOUNTER — Other Ambulatory Visit: Payer: Self-pay

## 2019-10-16 ENCOUNTER — Encounter (HOSPITAL_COMMUNITY): Payer: Self-pay | Admitting: Internal Medicine

## 2019-10-16 DIAGNOSIS — M545 Low back pain: Secondary | ICD-10-CM | POA: Diagnosis present

## 2019-10-16 DIAGNOSIS — Z91048 Other nonmedicinal substance allergy status: Secondary | ICD-10-CM | POA: Diagnosis not present

## 2019-10-16 DIAGNOSIS — I5022 Chronic systolic (congestive) heart failure: Secondary | ICD-10-CM | POA: Diagnosis present

## 2019-10-16 DIAGNOSIS — Z8249 Family history of ischemic heart disease and other diseases of the circulatory system: Secondary | ICD-10-CM | POA: Diagnosis not present

## 2019-10-16 DIAGNOSIS — N184 Chronic kidney disease, stage 4 (severe): Secondary | ICD-10-CM | POA: Diagnosis present

## 2019-10-16 DIAGNOSIS — Z6841 Body Mass Index (BMI) 40.0 and over, adult: Secondary | ICD-10-CM | POA: Diagnosis not present

## 2019-10-16 DIAGNOSIS — Z20822 Contact with and (suspected) exposure to covid-19: Secondary | ICD-10-CM | POA: Diagnosis present

## 2019-10-16 DIAGNOSIS — I959 Hypotension, unspecified: Secondary | ICD-10-CM | POA: Diagnosis present

## 2019-10-16 DIAGNOSIS — N179 Acute kidney failure, unspecified: Secondary | ICD-10-CM

## 2019-10-16 DIAGNOSIS — Z79890 Hormone replacement therapy (postmenopausal): Secondary | ICD-10-CM | POA: Diagnosis not present

## 2019-10-16 DIAGNOSIS — W19XXXA Unspecified fall, initial encounter: Secondary | ICD-10-CM | POA: Diagnosis present

## 2019-10-16 DIAGNOSIS — E860 Dehydration: Secondary | ICD-10-CM | POA: Diagnosis present

## 2019-10-16 DIAGNOSIS — I5023 Acute on chronic systolic (congestive) heart failure: Secondary | ICD-10-CM | POA: Diagnosis present

## 2019-10-16 DIAGNOSIS — M1030 Gout due to renal impairment, unspecified site: Secondary | ICD-10-CM | POA: Diagnosis present

## 2019-10-16 DIAGNOSIS — I130 Hypertensive heart and chronic kidney disease with heart failure and stage 1 through stage 4 chronic kidney disease, or unspecified chronic kidney disease: Secondary | ICD-10-CM | POA: Diagnosis present

## 2019-10-16 DIAGNOSIS — Y9301 Activity, walking, marching and hiking: Secondary | ICD-10-CM | POA: Diagnosis present

## 2019-10-16 DIAGNOSIS — E861 Hypovolemia: Secondary | ICD-10-CM | POA: Diagnosis present

## 2019-10-16 DIAGNOSIS — G8929 Other chronic pain: Secondary | ICD-10-CM | POA: Diagnosis present

## 2019-10-16 DIAGNOSIS — E039 Hypothyroidism, unspecified: Secondary | ICD-10-CM | POA: Diagnosis present

## 2019-10-16 DIAGNOSIS — E876 Hypokalemia: Secondary | ICD-10-CM | POA: Diagnosis present

## 2019-10-16 DIAGNOSIS — Y92009 Unspecified place in unspecified non-institutional (private) residence as the place of occurrence of the external cause: Secondary | ICD-10-CM | POA: Diagnosis not present

## 2019-10-16 DIAGNOSIS — M6282 Rhabdomyolysis: Secondary | ICD-10-CM | POA: Diagnosis present

## 2019-10-16 DIAGNOSIS — I428 Other cardiomyopathies: Secondary | ICD-10-CM | POA: Diagnosis present

## 2019-10-16 DIAGNOSIS — Z7901 Long term (current) use of anticoagulants: Secondary | ICD-10-CM | POA: Diagnosis not present

## 2019-10-16 DIAGNOSIS — I4891 Unspecified atrial fibrillation: Secondary | ICD-10-CM

## 2019-10-16 DIAGNOSIS — I480 Paroxysmal atrial fibrillation: Secondary | ICD-10-CM | POA: Diagnosis present

## 2019-10-16 DIAGNOSIS — Z888 Allergy status to other drugs, medicaments and biological substances status: Secondary | ICD-10-CM | POA: Diagnosis not present

## 2019-10-16 DIAGNOSIS — Z79899 Other long term (current) drug therapy: Secondary | ICD-10-CM | POA: Diagnosis not present

## 2019-10-16 LAB — BASIC METABOLIC PANEL
Anion gap: 16 — ABNORMAL HIGH (ref 5–15)
Anion gap: 13 (ref 5–15)
Anion gap: 14 (ref 5–15)
BUN: 119 mg/dL — ABNORMAL HIGH (ref 8–23)
BUN: 108 mg/dL — ABNORMAL HIGH (ref 8–23)
BUN: 96 mg/dL — ABNORMAL HIGH (ref 8–23)
CO2: 33 mmol/L — ABNORMAL HIGH (ref 22–32)
CO2: 31 mmol/L (ref 22–32)
CO2: 26 mmol/L (ref 22–32)
Calcium: 9.2 mg/dL (ref 8.9–10.3)
Calcium: 9.4 mg/dL (ref 8.9–10.3)
Calcium: 9.7 mg/dL (ref 8.9–10.3)
Chloride: 91 mmol/L — ABNORMAL LOW (ref 98–111)
Chloride: 94 mmol/L — ABNORMAL LOW (ref 98–111)
Chloride: 99 mmol/L (ref 98–111)
Creatinine, Ser: 2.98 mg/dL — ABNORMAL HIGH (ref 0.44–1.00)
Creatinine, Ser: 2.59 mg/dL — ABNORMAL HIGH (ref 0.44–1.00)
Creatinine, Ser: 2.32 mg/dL — ABNORMAL HIGH (ref 0.44–1.00)
GFR calc Af Amer: 19 mL/min — ABNORMAL LOW (ref >60)
GFR calc Af Amer: 22 mL/min — ABNORMAL LOW (ref >60)
GFR calc Af Amer: 25 mL/min — ABNORMAL LOW (ref >60)
GFR calc non Af Amer: 16 mL/min — ABNORMAL LOW (ref >60)
GFR calc non Af Amer: 19 mL/min — ABNORMAL LOW (ref >60)
GFR calc non Af Amer: 22 mL/min — ABNORMAL LOW (ref >60)
Glucose, Bld: 132 mg/dL — ABNORMAL HIGH (ref 70–99)
Glucose, Bld: 191 mg/dL — ABNORMAL HIGH (ref 70–99)
Glucose, Bld: 112 mg/dL — ABNORMAL HIGH (ref 70–99)
Potassium: 2.5 mmol/L — CL (ref 3.5–5.1)
Potassium: 3.1 mmol/L — ABNORMAL LOW (ref 3.5–5.1)
Potassium: 3.8 mmol/L (ref 3.5–5.1)
Sodium: 140 mmol/L (ref 135–145)
Sodium: 138 mmol/L (ref 135–145)
Sodium: 139 mmol/L (ref 135–145)

## 2019-10-16 LAB — CBC
HCT: 33 % — ABNORMAL LOW (ref 36.0–46.0)
Hemoglobin: 11 g/dL — ABNORMAL LOW (ref 12.0–15.0)
MCH: 30.5 pg (ref 26.0–34.0)
MCHC: 33.3 g/dL (ref 30.0–36.0)
MCV: 91.4 fL (ref 80.0–100.0)
Platelets: 304 10*3/uL (ref 150–400)
RBC: 3.61 MIL/uL — ABNORMAL LOW (ref 3.87–5.11)
RDW: 13.8 % (ref 11.5–15.5)
WBC: 7 10*3/uL (ref 4.0–10.5)
nRBC: 0 % (ref 0.0–0.2)

## 2019-10-16 LAB — SAMPLE TO BLOOD BANK

## 2019-10-16 LAB — MAGNESIUM: Magnesium: 2.5 mg/dL — ABNORMAL HIGH (ref 1.7–2.4)

## 2019-10-16 LAB — CK: Total CK: 304 U/L — ABNORMAL HIGH (ref 38–234)

## 2019-10-16 MED ORDER — POTASSIUM CHLORIDE 10 MEQ/100ML IV SOLN
10.0000 meq | INTRAVENOUS | Status: AC
  Administered 2019-10-16 (×3): 10 meq via INTRAVENOUS
  Filled 2019-10-16: qty 100

## 2019-10-16 MED ORDER — POTASSIUM CHLORIDE CRYS ER 20 MEQ PO TBCR
40.0000 meq | EXTENDED_RELEASE_TABLET | Freq: Once | ORAL | Status: AC
  Administered 2019-10-16: 40 meq via ORAL
  Filled 2019-10-16: qty 2

## 2019-10-16 MED ORDER — POTASSIUM CHLORIDE 10 MEQ/100ML IV SOLN: 10.0000 meq | INTRAVENOUS | Status: DC

## 2019-10-16 MED ORDER — POTASSIUM CHLORIDE CRYS ER 20 MEQ PO TBCR
40.0000 meq | EXTENDED_RELEASE_TABLET | ORAL | Status: AC
  Administered 2019-10-16 (×2): 40 meq via ORAL
  Filled 2019-10-16 (×2): qty 2

## 2019-10-16 MED ORDER — POTASSIUM CHLORIDE 2 MEQ/ML IV SOLN
INTRAVENOUS | Status: AC
  Filled 2019-10-16: qty 1000

## 2019-10-16 MED ORDER — POTASSIUM CHLORIDE 2 MEQ/ML IV SOLN
INTRAVENOUS | Status: DC
  Filled 2019-10-16: qty 1000

## 2019-10-16 NOTE — Progress Notes (Addendum)
Occupational Therapy Evaluation Patient Details Name: Caitlyn Fuentes MRN: [id number] DOB: 01/25/1956 Today's Date: 10/16/2019    History of Present Illness Caitlyn Fuentes is a 64 yo F w/ PMH of CKD4, OSA, Hypothyroidism, A.Fib on Eliquis, non-ischemic cardiomyopathy presenting to [facility] w/ fall. She was in her usual state of health until this morning when she fell while attempting grab her walker while walking out of the bathroom. Her legs gave way and she fell backwards, primarily landing on her left hip on her way down. She mentions that she also hit the back of her head on her way down but denies any loss of consciousness. She was down for about 30 minutes until her husband found her and called EMS. She denies any nausea, vomiting, diarrhea, fevers or chills. She denies any tongue-biting, bowel/urine incontinence. No acute fractures found.   Clinical Impression   PTA, pt lives with husband who has been recently diagnosed with dementia per family. Pt's husband provides assistance for ADLs as needed. Pt typically Modified Independent with mobility using Rollator in the home, but requires increased assistance from family during gout flare-ups. Presently, pt limited by decreased endurance, strength, and soreness from recent fall. Pt Supervision for bed mobility to sit EOB, but requires Mod A to power up for sit to stand from bedside with RW. Pt reported need to urinate, so OT guided pt in mobility to bathroom at [place] with RW. Pt overall Min A for toileting task for posterior hygiene and Max A to power up from low toilet (pt reports higher toilet at home). After hand hygiene at sink with daughter providing Min A, HR noted to be at 144bpm. OT guided pt in seated rest break with decrease to HR at 105bpm (same as start of session). After second bout of mobility back to recliner chair with RW and Min A, HR up to 124bpm. Pt and family report interested in CIR or [place] therapy. If 24/7 support available for pt at  home, recommend [facility] therapy and [place] aide to follow up at discharge. Due to debility with gout flare-ups, recommend use of manual wheelchair that is appropriate size for pt to decrease caregiver burden and maximize independence.     Follow Up Recommendations  Home health OT;Supervision/Assistance - 24 hour;Other (comment) ([place] aide; SNF if 24/7 physical assistance not available)    Equipment Recommendations  Wheelchair (measurements OT);Wheelchair cushion (measurements OT) (Pt reports current w/c is too big)    Recommendations for Other Services       Precautions / Restrictions Precautions Precautions: Fall Precaution Comments: monitor HR Restrictions Weight Bearing Restrictions: No      Mobility Bed Mobility Overal bed mobility: Needs Assistance Bed Mobility: Supine to Sit     Supine to sit: Supervision     General bed mobility comments: Supervision for safety. Increased time with use of bedrail  Transfers Overall transfer level: Needs assistance Equipment used: Rolling walker (2 wheeled) Transfers: Sit to/from [facility] Sit to Stand: Mod assist Stand pivot transfers: Min assist       General transfer comment: Mod A for power up with RW, minimal cues needed for hand placment, min A for stand pivot    Balance Overall balance assessment: Needs assistance Sitting-balance support: Single extremity supported Sitting balance-Leahy Scale: Good Sitting balance - Comments: Able to don hospital gown sitting EOB without LOB   Standing balance support: Bilateral upper extremity supported;During functional activity Standing balance-Leahy Scale: Poor Standing balance comment: Requires UE support on DME to maintain  balance                           ADL either performed or assessed with clinical judgement   ADL Overall ADL's : Needs assistance/impaired Eating/Feeding: Set up;Sitting   Grooming: Minimal assistance;Sitting   Upper Body Bathing:  Minimal assistance;Sitting   Lower Body Bathing: Minimal assistance;Sit to/from stand   Upper Body Dressing : Minimal assistance Upper Body Dressing Details (indicate cue type and reason): Min A to don gown around back sitting EOB Lower Body Dressing: Moderate assistance;Sit to/from stand;Sitting/lateral leans   Toilet Transfer: Minimal assistance;Ambulation;BSC;Regular Toilet;Grab bars Toilet Transfer Details (indicate cue type and reason): Min A for RW manuevering in small bathroom with cues for safe sequencing. Pt reports higher toilet at home, so Max A required to stand from hospital toilet today [place] and Hygiene: Minimal assistance;Sit to/from stand Toileting - Clothing Manipulation Details (indicate cue type and reason): Min A for posterior hygiene      Functional mobility during ADLs: Minimal assistance;Rolling walker;Cueing for safety General ADL Comments: Overall Min A for mobility with RW, Overall Mod A for LB ADLs with deficits in endurance (increasing HR), strength, and soreness from recent fall     Vision Baseline Vision/History: No visual deficits Patient Visual Report: No change from baseline Vision Assessment?: No apparent visual deficits     Perception     Praxis      Pertinent Vitals/Pain Pain Assessment: Faces Faces Pain Scale: Hurts little more Pain Location: L shoulder, generalized Pain Descriptors / Indicators: Grimacing;Sore Pain Intervention(s): Limited activity within patient's tolerance;Monitored during session     Hand Dominance Right   Extremity/Trunk Assessment Upper Extremity Assessment Upper Extremity Assessment: Generalized weakness;LUE deficits/detail LUE Deficits / Details: decreased shoulder flexion, more limitations due to soreness after fall but reports difficulty reaching to brush hair LUE Sensation: WNL LUE Coordination: WNL   Lower Extremity Assessment Lower Extremity Assessment: Defer to PT evaluation    Cervical / Trunk Assessment Cervical / Trunk Assessment: Normal   Communication Communication Communication: No difficulties   Cognition Arousal/Alertness: Awake/alert Behavior During Therapy: WFL for tasks assessed/performed Overall Cognitive Status: Within Functional Limits for tasks assessed                                     General Comments  At beginning of eval, HR at 105bpm, which increased to a max of 144bpm after toileting task and increased SOB noted. Cued and educated on need for rest break before continuation of activity. After second bout of mobility back to recliner, HR at 124bpm, which decreased back to low 100s once seated    Exercises     Shoulder Instructions      Home Living Family/patient expects to be discharged to:: Private residence Living Arrangements: Spouse/significant other Available Help at Discharge: Family;Available PRN/intermittently (daughters work, one daughter is a Marine scientist) Type of Home: House Home Access: Stairs to enter [employer] of Steps: 2 (2 at back door, 3 at front) Entrance Stairs-Rails: Left Home Layout: Two level;Able to live on main level with bedroom/bathroom     Bathroom Shower/Tub: Occupational psychologist: Handicapped height     Home Equipment: Shower seat;Bedside commode;Walker - 4 wheels;Wheelchair - manual;Grab bars - toilet;Walker - 2 wheels (reports wc is too big, adjustable head of bed)  Prior Functioning/Environment Level of Independence: Needs assistance  Gait / Transfers Assistance Needed: Pt reports Modified Independent for mobility in the home with Rollator, but daughter reports she requires assistance during a gout flare up when walking is more difficult. Daughter reports they push pt on Rollator in home during gout flare up ADL's / Homemaking Assistance Needed: Pt reports husband assists with dressing tasks (mostly LB), shower transfers and bathing tasks              OT Problem List: Decreased strength;Decreased activity tolerance;Impaired balance (sitting and/or standing);Cardiopulmonary status limiting activity      OT Treatment/Interventions: Self-care/ADL training;Therapeutic exercise;Energy conservation;DME and/or AE instruction;Therapeutic activities;Patient/family education    OT Goals(Current goals can be found in the care plan section) Acute Rehab OT Goals Patient Stated Goal: improve strength  OT Goal Formulation: With patient/family Time For Goal Achievement: 10/30/19 Potential to Achieve Goals: Good ADL Goals Pt Will Transfer to Toilet: with supervision;ambulating;grab bars Pt Will Perform Toileting - Clothing Manipulation and hygiene: with modified independence;sit to/from stand Pt/caregiver will Perform Home Exercise Program: Increased strength;Increased ROM;Both right and left upper extremity;With theraband;Independently;With written HEP provided Additional ADL Goal #1: Pt to demonstrate ability to stand at sink for grooming tasks with Supervision for 3 minutes without abnormal changes in vital signs.  OT Frequency: Min 2X/week   Barriers to D/C:            Co-evaluation              AM-PAC OT "6 Clicks" Daily Activity     Outcome Measure Help from another person eating meals?: A Little Help from another person taking care of personal grooming?: A Little Help from another person toileting, which includes using toliet, bedpan, or urinal?: A Little Help from another person bathing (including washing, rinsing, drying)?: A Little Help from another person to put on and taking off regular upper body clothing?: A Little Help from another person to put on and taking off regular lower body clothing?: A Lot 6 Click Score: 17   End of Session Equipment Utilized During Treatment: Gait belt;Rolling walker Nurse Communication: Mobility status;Other (comment) (HR)  Activity Tolerance: Patient tolerated treatment well Patient left:  in chair;with call bell/phone within reach;with chair alarm set;with family/visitor present  OT Visit Diagnosis: Unsteadiness on feet (R26.81);Other abnormalities of gait and mobility (R26.89);Muscle weakness (generalized) (M62.81);History of falling (Z91.81)                Time: 6333-5456 OT Time Calculation (min): 42 min Charges:  OT General Charges $OT Visit: 1 Visit OT Evaluation $OT Eval Moderate Complexity: 1 Mod OT Treatments $Self Care/Home Management : 8-22 mins $Therapeutic Activity: 8-22 mins  Layla Maw, OTR/L  Layla Maw 10/16/2019, 9:23 AM

## 2019-10-16 NOTE — Plan of Care (Signed)

## 2019-10-16 NOTE — Evaluation (Signed)
Physical Therapy Evaluation Patient Details Name: Caitlyn Fuentes MRN: [id number] DOB: [birth year] Today's Date: 10/16/2019   History of Present Illness  Pt is 64 yo female w/ PMH of CKD4, OSA, Hypothyroidism, A.Fib on Eliquis, non-ischemic cardiomyopathy presenting to [facility] w/ fall. Pt admitted s/p fall with weakness secondary to hypokalemia and chronic HF.  Clinical Impression  Pt admitted with above diagnosis. She presents with decreased mobility, strength, endurance, safety, and balance.  Pt fatigued easily with HR up to 135 bpm and DOE 3/4 with short distance ambulation.  Pt lives at home with spouse who has dementia and daughter who checks in daily, but works and not able to provide 24 hr care.  Pt reports that she is agreeable to SNF at d/c to improve strength.  Pt currently with functional limitations due to the deficits listed below (see PT Problem List). Pt will benefit from skilled PT to increase their independence and safety with mobility to allow discharge to the venue listed below.       Follow Up Recommendations SNF    Equipment Recommendations  None recommended by PT    Recommendations for Other Services       Precautions / Restrictions Precautions Precautions: Fall Precaution Comments: monitor HR      Mobility  Bed Mobility Overal bed mobility: Needs Assistance Bed Mobility: Supine to Sit;Sit to Supine     Supine to sit: Min assist Sit to supine: Min assist   General bed mobility comments: Min A to lift trunk and min A to help legs back to bed  Transfers Overall transfer level: Needs assistance Equipment used: Rolling walker (2 wheeled) Transfers: Sit to/from Stand Sit to Stand: Mod assist         General transfer comment: Mod A to power up; cues for safe hand placement; performed x 2  Ambulation/Gait Ambulation/Gait assistance: Min assist Gait Distance (Feet): 15 Feet (x2) Assistive device: Rolling walker (2 wheeled) Gait Pattern/deviations:  Decreased stride length;Shuffle Gait velocity: decreased   General Gait Details: fatigued easily with DOE of 3/4 and HR up to 135 bpm  Stairs            Wheelchair Mobility    Modified Rankin (Stroke Patients Only)       Balance Overall balance assessment: Needs assistance Sitting-balance support: No upper extremity supported Sitting balance-Leahy Scale: Good     Standing balance support: Single extremity supported;During functional activity Standing balance-Leahy Scale: Fair Standing balance comment: Requires UE support on DME to maintain balance                             Pertinent Vitals/Pain Pain Assessment: 0-10 Pain Score: 6  Pain Location: L hip and side Pain Descriptors / Indicators: Sore Pain Intervention(s): Limited activity within patient's tolerance;Monitored during session    Home Living Family/patient expects to be discharged to:: Private residence Living Arrangements: Spouse/significant other Available Help at Discharge: Family;Available PRN/intermittently (Spouse is there all of the time but with dementia; daughter checks daily) Type of Home: House Home Access: Stairs to enter Entrance Stairs-Rails: Left Entrance Stairs-Number of Steps: 2 Home Layout: Two level;Able to live on main level with bedroom/bathroom Home Equipment: Shower seat;Bedside commode;Walker - 4 wheels;Wheelchair - manual;Grab bars - toilet;Walker - 2 wheels      Prior Function Level of Independence: Needs assistance   Gait / Transfers Assistance Needed: Pt reports Modified Independent for mobility in the home with Rollator, but daughter reports she  requires assistance during a gout flare up when walking is more difficult. Daughter reports they push pt on Rollator in home during gout flare up  ADL's / Homemaking Assistance Needed: Pt reports husband assists with dressing tasks (mostly LB), shower transfers and bathing tasks         Hand Dominance   Dominant  Hand: Right    Extremity/Trunk Assessment   Upper Extremity Assessment Upper Extremity Assessment: Defer to OT evaluation    Lower Extremity Assessment Lower Extremity Assessment: LLE deficits/detail;RLE deficits/detail RLE Deficits / Details: ROM WFL; MMT 4+/5 LLE Deficits / Details: ROM WFL; MMT 4+/5    Cervical / Trunk Assessment Cervical / Trunk Assessment: Normal  Communication   Communication: No difficulties  Cognition Arousal/Alertness: Awake/alert Behavior During Therapy: WFL for tasks assessed/performed Overall Cognitive Status: Within Functional Limits for tasks assessed                                        General Comments General comments (skin integrity, edema, etc.): HR 98 bpm rest and up to 135 bpm with activity, recovered after 3-5 minutes.  Pt with increased RR and DOE of 3/4    Exercises     Assessment/Plan    PT Assessment Patient needs continued PT services  PT Problem List Decreased strength;Decreased mobility;Decreased activity tolerance;Cardiopulmonary status limiting activity;Decreased balance;Decreased knowledge of use of DME       PT Treatment Interventions DME instruction;Therapeutic activities;Gait training;Therapeutic exercise;Patient/family education;Stair training;Balance training;Functional mobility training    PT Goals (Current goals can be found in the Care Plan section)  Acute Rehab PT Goals Patient Stated Goal: improve strength  PT Goal Formulation: With patient Time For Goal Achievement: 10/30/19 Potential to Achieve Goals: Good    Frequency Min 2X/week   Barriers to discharge Decreased caregiver support      Co-evaluation               AM-PAC PT "6 Clicks" Mobility  Outcome Measure Help needed turning from your back to your side while in a flat bed without using bedrails?: A Little Help needed moving from lying on your back to sitting on the side of a flat bed without using bedrails?: A  Little Help needed moving to and from a bed to a chair (including a wheelchair)?: A Little Help needed standing up from a chair using your arms (e.g., wheelchair or bedside chair)?: A Lot Help needed to walk in hospital room?: A Little Help needed climbing 3-5 steps with a railing? : A Lot 6 Click Score: 16    End of Session Equipment Utilized During Treatment: Gait belt Activity Tolerance: Patient limited by fatigue Patient left: in bed;with call bell/phone within reach;with bed alarm set Nurse Communication: Mobility status PT Visit Diagnosis: Unsteadiness on feet (R26.81);History of falling (Z91.81)    Time: 9381-0175 PT Time Calculation (min) (ACUTE ONLY): 25 min   Charges:   PT Evaluation $PT Eval Moderate Complexity: 1 Mod PT Treatments $Gait Training: 8-22 mins        Abran Richard, PT Acute Rehab Services Pager [phone] [facility] [place] 10/16/2019, 2:43 PM

## 2019-10-16 NOTE — Progress Notes (Signed)
PT Cancellation Note  Patient Details Name: Diksha Tagliaferro MRN: [id number] DOB: [birth year]   Cancelled Treatment:    Reason Eval/Treat Not Completed: Other (comment)  RN reports pt just got back in bed and started potassium.  Request to follow up later.  PT will follow up later today as able.  This occurred around 1000.    Abran Richard, PT Acute Rehab Services Pager [phone] [facility] [place] 10/16/2019, 1:05 PM

## 2019-10-16 NOTE — Progress Notes (Addendum)
Subjective: HD 1  Overnight, no acute events reported.  Caitlyn Fuentes was evaluated at bedside this morning.   Has felt as though she has been getting weaker for the past week prior to her fall. She denies increased urination the past week and states she feels she has been urinating less. She states she has bad gout and has been drinking less recently, for the most part to just take her medicine. She did have some diarrhea with her recent colchicine treatment for her gout about a week or 2 ago. Takes metolazone as needed for increased edema and SOB; she reports taking it about 4 times a month. She does not know if she has taken spiro in the past, she may have taken aldactone years ago. Reports she now has CKD 4.   Objective:  Vital signs in last 24 hours: Vitals:   10/15/19 1928 10/15/19 2241 10/16/19 0500 10/16/19 0751  BP: 134/77 130/71  124/73  Pulse: 99 87  92  Resp: 20 17  19   Temp: 98.1 F (36.7 C) 98.5 F (36.9 C)  (!) 97.5 F (36.4 C)  TempSrc: Oral Oral    SpO2: 97% 98%  98%  Weight:   121.8 kg   Height:       CBC Latest Ref Rng & Units 10/16/2019 10/15/2019 10/15/2019  WBC 4.0 - 10.5 K/uL 7.0 - 6.7  Hemoglobin 12.0 - 15.0 g/dL 11.0(L) 13.3 13.1  Hematocrit 36 - 46 % 33.0(L) 39.0 38.9  Platelets 150 - 400 K/uL 304 - 355   BMP Latest Ref Rng & Units 10/16/2019 10/15/2019 10/15/2019  Glucose 70 - 99 mg/dL 132(H) 128(H) 138(H)  BUN 8 - 23 mg/dL 119(H) 123(H) >130(H)  Creatinine 0.44 - 1.00 mg/dL 2.98(H) 3.09(H) 3.70(H)  Sodium 135 - 145 mmol/L 140 135 135  Potassium 3.5 - 5.1 mmol/L 2.5(LL) 2.2(LL) 2.7(LL)  Chloride 98 - 111 mmol/L 91(L) 89(L) 85(L)  CO2 22 - 32 mmol/L 33(H) 29 -  Calcium 8.9 - 10.3 mg/dL 9.2 9.1 -   Physical Exam  Constitutional: Appears well-developed and well-nourished. No distress.  HENT:  Normocephalic and atraumatic. EOMI nl, MMM, Conjunctivae are normal.  Cardiovascular: Normal rate, regular rhythm, S1 and S2 present, normal heart sounds.  Distal pulses intact  Respiratory: No respiratory distress; effort normal on room air; CTAB GI: Soft. Bowel sounds are normal. No distension. There is no tenderness.  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 Behavior is normal. Judgment and thought content normal.   Assessment/Plan:  Active Problems:   Hypokalemia  Caitlyn Fuentes is a 64 year old female with PMHx of CKD IV, OSA, hypothyroidism, HFrEF (EF 40-45%), A.fib on Eliquis admitted for hypokalemia and acute on chronic renal failure.   Hypokalemia: Acute on chronic renal failure:  Rhabdomyolysis:  Hypokalemic to 2.6 on presentation and AKI in setting of over-diuresis as patient is on torsemide 40mg  bid with metolazone as needed which she reports as taking ~3-4 times per month. Managed with fluid resuscitation and potassium and mag replacement.  Patient's baseline sCr unclear (1.19 3 years ago); however, currently has CKD IV. sCr improved to 2.98 with fluid resuscitation this AM, likely close to baseline. Did  have hemoglobinuria on UA with elevated CK to 304 concerning for rhabdomyolysis. - Continue potassium supplementation, goal K >4, Mg >2 - Continue gentle fluid resuscitation - Monitor renal function - Strict I&O, daily weights - Continue cardiac monitoring  Chronic low back pain: Patient on chronic opioid therapy for her back pain. Currently at baseline.  - Continue buprenorphine  HFrEF (EF 40-45%): Atrial fibrillation on Eliquis: Patient is on carvedilol, torsemide and metolazone prn for heart failure. Noted allergy to ACEi and she reports that she was discontinued  from ARB due to worsening renal function. Patient notes that she has previously tried spironolactone but was also discontinued from this by nephrologist. Currently euvolemic on examination. - Continue to monitor volume status - Close follow up with PCP and nephrologist - Eliquis 5mg  bid   FEN/GI: Diet: HH Fluids: LR+KCl 100cc/hr Electrolytes: Monitor and replete prn  DVT Prophylaxis: Eliquis Code status: FULL code   Prior to Admission Living Arrangement: Home Anticipated Discharge Location: Home w/HH Barriers to Discharge: Continued medical management  Dispo: Anticipated discharge in approximately 1-2 day(s).   Harvie Heck, MD  Internal Medicine, PGY-1 10/16/2019, 7:57 AM Pager: [phone] After 5pm on weekdays and 1pm on weekends: On Call pager [phone]

## 2019-10-17 LAB — BASIC METABOLIC PANEL
Anion gap: 13 (ref 5–15)
BUN: 80 mg/dL — ABNORMAL HIGH (ref 8–23)
CO2: 29 mmol/L (ref 22–32)
Calcium: 9.9 mg/dL (ref 8.9–10.3)
Chloride: 103 mmol/L (ref 98–111)
Creatinine, Ser: 2.14 mg/dL — ABNORMAL HIGH (ref 0.44–1.00)
GFR calc Af Amer: 28 mL/min — ABNORMAL LOW (ref >60)
GFR calc non Af Amer: 24 mL/min — ABNORMAL LOW (ref >60)
Glucose, Bld: 136 mg/dL — ABNORMAL HIGH (ref 70–99)
Potassium: 4.4 mmol/L (ref 3.5–5.1)
Sodium: 145 mmol/L (ref 135–145)

## 2019-10-17 NOTE — Progress Notes (Signed)
Subjective: HD 2 Overnight no acute events reported.  Caitlyn Fuentes was evaluated at bedside this morning. She is sitting in chair by the sink. Patient endorses some left leg pain from when she fell; however notes that her overall weakness has significantly improved. Discussed that her lab results are improved and plans for discharge to SNF. Patient notes that she would like to discharge to home as her spouse has dementia and she is the main caretaker. She is requesting home health services instead.   Objective:  Vital signs in last 24 hours: Vitals:   10/16/19 0500 10/16/19 0751 10/16/19 1650 10/16/19 2141  BP:  124/73 131/67 126/65  Pulse:  92 98 97  Resp:  19 19 18   Temp:  (!) 97.5 F (36.4 C) 97.7 F (36.5 C) 97.6 F (36.4 C)  TempSrc:    Oral  SpO2:  98% 100% 99%  Weight: 121.8 kg     Height:       CBC Latest Ref Rng & Units 10/16/2019 10/15/2019 10/15/2019  WBC 4.0 - 10.5 K/uL 7.0 - 6.7  Hemoglobin 12.0 - 15.0 g/dL 11.0(L) 13.3 13.1  Hematocrit 36 - 46 % 33.0(L) 39.0 38.9  Platelets 150 - 400 K/uL 304 - 355   BMP Latest Ref Rng & Units 10/17/2019 10/16/2019 10/16/2019  Glucose 70 - 99 mg/dL 136(H) 112(H) 191(H)  BUN 8 - 23 mg/dL 80(H) 96(H) 108(H)  Creatinine 0.44 - 1.00 mg/dL 2.14(H) 2.32(H) 2.59(H)  Sodium 135 - 145 mmol/L 145 139 138  Potassium 3.5 - 5.1 mmol/L 4.4 3.8 3.1(L)  Chloride 98 - 111 mmol/L 103 99 94(L)  CO2 22 - 32 mmol/L 29 26 31   Calcium 8.9 - 10.3 mg/dL 9.9 9.7 9.4    Physical Exam  Constitutional: Appears well-developed and well-nourished. No distress.  HENT:  Normocephalic and atraumatic. EOMI, MMM,  Conjunctivae are normal.  Cardiovascular: RRR, S1 and S2 present, no m/r/g; distal pulses intact  Respiratory: Effort normal and breath sounds normal. No respiratory distress on RA GI: Soft. Bowel sounds are normal. No distension. There is no tenderness.  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   Hypokalemia: Acute on chronic renal failure:  In setting of over-diuresis with torsemide bid and metolazone. Patient treated with fluid resuscitation and potassium supplementation. Renal function improved to 2.14 this AM and K 4.4. Patient is currently stable for discharge and for close follow up with PCP and nephrologist.  - Discontinue metolazone on discharge - Continue torsemide 40mg  bid - F/u with PCP and nephrologist  HFrEF (EF 40-45%) Atrial fibrillation: Continue carvedilol and torsemide on discharge for HFrEF. May consider addition of spironolactone at follow up for HF optimization pending renal function. Currently euvolemic on exam. - Continue carvedilol and torsemide - Continue Eliquis  - F/u with PCP and nephrologist   FEN/GI: Diet: [place]  Fluids: None  Electrolytes: Monitor and replete prn  DVT Prophylaxis: Eliquis Code status:  FULL   Prior to Admission Living Arrangement: Home  Anticipated Discharge Location: Home with [facility] Barriers to Discharge: None  Dispo: Anticipated discharge today.   Harvie Heck, MD  Internal Medicine, PGY-1 10/17/2019, 6:44 AM Pager:[phone] After 5pm on weekdays and 1pm on weekends: On Call pager [phone]

## 2019-10-17 NOTE — Discharge Summary (Signed)
Name: Caitlyn Fuentes MRN: [id number] DOB: 1955-11-11 64 y.o. PCP: Patient, No Pcp Per  Date of Admission: 10/15/2019 11:49 AM Date of Discharge: 10/17/2019 Attending Physician: Dr. Gilles Chiquito  Discharge Diagnosis: 1. Hypokalemia 2. Acute on chronic renal failure 2. Chronic systolic heart failure 3. Atrial fibrillation  Discharge Medications: Allergies as of 10/17/2019      Reactions   Lisinopril Shortness Of Breath, Swelling, Other (See Comments)   Face, tongue, and mouth swell   Tape Rash, Other (See Comments)   PLEASE USE COBAN WRAP!!   Diovan [valsartan] Other (See Comments)   Affected kidneys   Zaroxolyn [metolazone] Other (See Comments)   Caused acute kidney injury at a frequent dosage      Medication List    STOP taking these medications   metolazone 2.5 MG tablet Commonly known as: ZAROXOLYN     TAKE these medications   acetaminophen 500 MG tablet Commonly known as: TYLENOL Take 500-1,000 mg by mouth every 6 (six) hours as needed for mild pain or headache.   atorvastatin 40 MG tablet Commonly known as: LIPITOR Take 40 mg by mouth at bedtime.   Azelaic Acid 15 % cream Apply 1 application topically 2 (two) times daily as needed (to affected areas of the face).   Belbuca 900 MCG Film Generic drug: Buprenorphine HCl Place 1 Film inside cheek in the morning and at bedtime.   carvedilol 80 MG 24 hr capsule Commonly known as: COREG CR Take 80 mg by mouth daily.   colchicine 0.6 MG tablet Take 0.6 mg by mouth 2 (two) times daily as needed (for gout flares).   cyclobenzaprine 10 MG tablet Commonly known as: FLEXERIL Take 10 mg by mouth 3 (three) times daily as needed for muscle spasms.   doxepin 50 MG capsule Commonly known as: SINEQUAN Take 50 mg by mouth at bedtime.   Eliquis 5 MG Tabs tablet Generic drug: apixaban Take 5 mg by mouth 2 (two) times daily.   estradiol 2 MG tablet Commonly known as: ESTRACE Take 2 mg by mouth daily.     febuxostat 40 MG tablet Commonly known as: ULORIC Take 40 mg by mouth in the morning.   gabapentin 300 MG capsule Commonly known as: NEURONTIN Take 300 mg by mouth 3 (three) times daily.   HYDROcodone-acetaminophen 5-325 MG tablet Commonly known as: NORCO/VICODIN Take 1 tablet by mouth 3 (three) times daily as needed (for pain).   levothyroxine 100 MCG tablet Commonly known as: SYNTHROID Take 100 mcg by mouth daily before breakfast.   phentermine 37.5 MG tablet Commonly known as: ADIPEX-P Take 18.75 mg by mouth daily.   predniSONE 5 MG tablet Commonly known as: DELTASONE Take 5-30 mg by mouth See admin instructions. Take 30 mg by mouth once daily on days 1 & 2, 25 mg once daily on days 3 & 4, 20 mg once daily on days 5 & 6, 15 mg once daily on days 7 & 8, 10 mg once daily on days 9 & 10, and 5 mg once daily on days 11 & 12 to complete a 12-day's course   ProAir RespiClick 932 (90 Base) MCG/ACT Aepb Generic drug: Albuterol Sulfate Inhale 2 puffs into the lungs daily as needed (for shortness of brath or wheezing).   torsemide 20 MG tablet Commonly known as: DEMADEX Take 40 mg by mouth 2 (two) times daily.      Disposition and follow-up:   Caitlyn Fuentes was discharged from [facility] in Stable condition.  At the hospital follow up visit please address:  1.  Acute on chronic renal failure and hypokalemia: In setting of over-diuresis and dehydration. - Recommended to discontinue metolazone on discharge - F/u with PCP and nephrology on discharge   2. Chronic systolic heart failure EF 40-45% - Continue carvedilol and torsemide on discharge - Ensure she takes her potassium supplementation as prescribed - Consider addition of spironolactone pending renal function  3. Atrial fibrillation:  - Continue carvedilol and Eliquis  - F/u with PCP  4.  Labs / imaging needed at time of follow-up: BMP  5.  Pending labs/ test needing follow-up:  None  Follow-up Appointments:  Follow-up Information    Edrick Oh, MD. Go on 10/20/2019.   Specialty: Nephrology Contact information: [place] [address] [phone]        Care, [facility] Follow up.   Specialty: Home Health Services Why: for home health services. they will call you in 1-2 days to set up your first home appointment Contact information: [place] [address] [phone]               Hospital Course by problem list: 1. Acute on chronic renal failure / Hypokalemia Patient presented after a fall in setting of generalized weakness. She was noted to have K of 2.6 and magnesium of 1.2 on admission. Also noted to have AKI with sCr of 3.34 in setting of CKD IV. Patient without any acute fractures or bleed. She is on torsemide 40mg  bid and metolazone 2.5mg  as needed. Presumed to be weakness due to electrolyte imbalance from over-diuresis. She was treated w/ fluid resuscitation and electrolyte replacement and discharged w/ recommendation to hold metolazone and take her potassium supplementation as needed.  2. HFrEF  Hypovolemic and hypotensive on admission. Home heart-failure meds held during admission.  3. Atrial fibrillation Continued on beta-blockade during hospitalization.  Discharge Vitals:   BP 126/70 (BP Location: Right Arm)   Pulse (!) 106   Temp (!) 97.5 F (36.4 C) (Oral)   Resp 18   Ht 5\' 4"  (1.626 m)   Wt 121.8 kg   SpO2 97%   BMI 46.09 kg/m   Pertinent Labs, Studies, and Procedures:  CBC Latest Ref Rng & Units 10/16/2019 10/15/2019 10/15/2019  WBC 4.0 - 10.5 K/uL 7.0 - 6.7  Hemoglobin 12.0 - 15.0 g/dL 11.0(L) 13.3 13.1  Hematocrit 36 - 46 % 33.0(L) 39.0 38.9  Platelets 150 - 400 K/uL 304 - 355   BMP Latest Ref Rng & Units 10/17/2019 10/16/2019 10/16/2019  Glucose 70 - 99 mg/dL 136(H) 112(H) 191(H)  BUN 8 - 23 mg/dL 80(H) 96(H) 108(H)  Creatinine 0.44 - 1.00 mg/dL 2.14(H) 2.32(H) 2.59(H)   Sodium 135 - 145 mmol/L 145 139 138  Potassium 3.5 - 5.1 mmol/L 4.4 3.8 3.1(L)  Chloride 98 - 111 mmol/L 103 99 94(L)  CO2 22 - 32 mmol/L 29 26 31   Calcium 8.9 - 10.3 mg/dL 9.9 9.7 9.4   Lactic Acid, Venous 0.5 - 1.9 mmol/L 2.0High Panic    Magnesium 1.7 - 2.4 mg/dL 1.2Low    TSH 0.350 - 4.500 uIU/mL 0.688    Total CK 38.0 - 234.0 U/L 304High    Urinalysis    Component Value Date/Time   COLORURINE STRAW (A) 10/15/2019 1952   APPEARANCEUR CLEAR 10/15/2019 1952   LABSPEC 1.008 10/15/2019 1952   PHURINE 6.0 10/15/2019 1952   GLUCOSEU NEGATIVE 10/15/2019 1952   [place] (A) 10/15/2019 1952  BILIRUBINUR NEGATIVE 10/15/2019 [place] NEGATIVE 10/15/2019 1952   PROTEINUR NEGATIVE 10/15/2019 1952   NITRITE NEGATIVE 10/15/2019 1952   LEUKOCYTESUR NEGATIVE 10/15/2019 1952   CXR 10/15/2019: FINDINGS: Cardiomediastinal contours and hilar structures are normal. Lungs are clear. No sign of pleural effusion. Study limited by body habitus and portable technique. Limited assessment of skeletal structures without acute finding.  X-RAY PELVIS 10/15/2019: IMPRESSION: Pubic symphysis widening and asymmetric widening of the LEFT SI joint with sclerotic margin. Most of the findings appear chronic. Most of the findings do appear chronic, however given widening of symphyseal elements and sacroiliac joints in the setting of trauma acute on chronic process is not excluded. These findings were not present on the 2015 CT. Differential diagnosis for the chronic component of the above findings would include inflammatory sacroiliitis with asymmetry or indolent infection involving the sacroiliac joints. CT is suggested for further evaluation. No additional bone abnormalities on plain film radiograph.  X-RAY LEFT HIP 10/15/2019: FINDINGS: There is no evidence of hip fracture or dislocation. There is no evidence of arthropathy or other focal bone abnormality.  CT HEAD AND CERVICAL  SPINE 10/15/2019: IMPRESSION: 1. No acute intracranial findings. 2. No acute fracture or traumatic listhesis of the cervical spine.  CT PELVIS WO CONTRAST 10/15/2019: IMPRESSION: Chronic appearing erosive changes involving the sacroiliac joints bilaterally with surrounding sclerosis. This likely represents chronic sacroiliitis. No acute fracture is seen. No associated soft tissue abnormality is noted. No hip fracture is noted. Mild widening of the pubic symphysis without acute bony abnormality. This is also likely related to chronic inflammatory change.  Discharge Instructions: Discharge Instructions    (HEART FAILURE PATIENTS) Call MD:  Anytime you have any of the following symptoms: 1) 3 pound weight gain in 24 hours or 5 pounds in 1 week 2) shortness of breath, with or without a dry hacking cough 3) swelling in the hands, feet or stomach 4) if you have to sleep on extra pillows at night in order to breathe.   Complete by: As directed    Call MD for:  difficulty breathing, headache or visual disturbances   Complete by: As directed    Call MD for:  extreme fatigue   Complete by: As directed    Call MD for:  hives   Complete by: As directed    Call MD for:  persistant dizziness or light-headedness   Complete by: As directed    Call MD for:  persistant nausea and vomiting   Complete by: As directed    Call MD for:  severe uncontrolled pain   Complete by: As directed    Call MD for:  temperature >100.4   Complete by: As directed    Diet - low sodium heart healthy   Complete by: As directed    Discharge instructions   Complete by: As directed    Caitlyn Fuentes,  You were admitted to the hospital after a fall at home. You were found to have worsening renal function and low potassium levels, likely in setting of over diuresis. You were treated with fluids and potassium replacement with improvement in your labs to baseline. On discharge, we recommend continuing torsemide as prescribed.  Please do not take your metolazone at this time. Please follow up with Dr. Justin Mend on 6/29 for any further medication adjustments.  Thank you!   Increase activity slowly   Complete by: As directed       Signed: Mosetta Anis, MD 10/19/2019, 11:39 AM  Pager: [phone]

## 2019-10-17 NOTE — Progress Notes (Signed)
Physical Therapy Treatment Patient Details Name: Caitlyn Fuentes MRN: [id number] DOB: 1955-07-25 Today's Date: 10/17/2019    History of Present Illness Pt is 64 yo female w/ PMH of CKD4, OSA, Hypothyroidism, A.Fib on Eliquis, non-ischemic cardiomyopathy presenting to [facility] w/ fall. Pt admitted s/p fall with weakness secondary to hypokalemia and chronic HF.    PT Comments    Pt sitting EOB upon arrival of PT, eager to participate in session with reports of desire to return home rather than ST SNF due to spouse with dementia. The pt was able to complete multiple short bouts of ambulation with use of RW and minG for safety, but continues to be limited by rapid onset of sig fatigue and HR increase from 110's at rest to 140's with activity (high of 145 bpm). The pt was thoroughly educated on energy conservation and self-regulation techniques. Tips for home management, and was agreeable to OP Pt services to continue to work on endurance and establishing exercise regimen. The pt will be safe to return home with supervision from husband and daily check ins by 3 daughters in the area.    Follow Up Recommendations  Outpatient PT;Supervision/Assistance - 24 hour (pt stated she could drive (if cleared by MD) and could arrange transportation if not cleared to drive)     Equipment Recommendations  None recommended by PT (pt has needed equipment)    Recommendations for Other Services       Precautions / Restrictions Precautions Precautions: Fall Precaution Comments: monitor HR Restrictions Weight Bearing Restrictions: No    Mobility  Bed Mobility Overal bed mobility: Needs Assistance             General bed mobility comments: pt sitting EOB upon arrival  Transfers Overall transfer level: Needs assistance Equipment used: Rolling walker (2 wheeled) Transfers: Sit to/from Stand Sit to Stand: Min assist;Supervision         General transfer comment: initally minA with extra time to  power up. completed 5xSTS in 37 sec with use of RW  Ambulation/Gait Ambulation/Gait assistance: Min guard Gait Distance (Feet): 15 Feet (x3) Assistive device: Rolling walker (2 wheeled) Gait Pattern/deviations: Decreased stride length;Shuffle;Step-through pattern;Trunk flexed Gait velocity: decreased Gait velocity interpretation: <1.31 ft/sec, indicative of household ambulator General Gait Details: pt fatigues following 9ft requiring 2-5 min rest after each bout. HR from 110s to 140s with ambulation, returns to 110s with seated rest only.       Balance Overall balance assessment: Needs assistance Sitting-balance support: No upper extremity supported Sitting balance-Leahy Scale: Good     Standing balance support: Single extremity supported;During functional activity Standing balance-Leahy Scale: Fair Standing balance comment: Requires UE support on DME to maintain balance                            Cognition Arousal/Alertness: Awake/alert Behavior During Therapy: WFL for tasks assessed/performed Overall Cognitive Status: Within Functional Limits for tasks assessed                                 General Comments: pt agreeable to all education      Exercises      General Comments General comments (skin integrity, edema, etc.): HR 112 bpm at rest, increased to 145 bpm with activity, recovered in 2-5 min seated rest.      Pertinent Vitals/Pain Pain Assessment: Faces Faces Pain Scale: Hurts a little bit Pain  Location: L hip and side Pain Descriptors / Indicators: Sore Pain Intervention(s): Limited activity within patient's tolerance;Monitored during session;Repositioned           PT Goals (current goals can now be found in the care plan section) Acute Rehab PT Goals Patient Stated Goal: improve strength and endurance PT Goal Formulation: With patient Time For Goal Achievement: 10/30/19 Potential to Achieve Goals: Good Progress towards PT  goals: Progressing toward goals    Frequency    Min 3X/week      PT Plan Discharge plan needs to be updated       AM-PAC PT "6 Clicks" Mobility   Outcome Measure  Help needed turning from your back to your side while in a flat bed without using bedrails?: A Little Help needed moving from lying on your back to sitting on the side of a flat bed without using bedrails?: A Little Help needed moving to and from a bed to a chair (including a wheelchair)?: A Little Help needed standing up from a chair using your arms (e.g., wheelchair or bedside chair)?: A Little Help needed to walk in hospital room?: A Little Help needed climbing 3-5 steps with a railing? : A Lot 6 Click Score: 17    End of Session Equipment Utilized During Treatment: Gait belt Activity Tolerance: Patient limited by fatigue Patient left: in chair;with family/visitor present;with call bell/phone within reach Nurse Communication: Mobility status PT Visit Diagnosis: Unsteadiness on feet (R26.81);History of falling (Z91.81)     Time: 3545-6256 PT Time Calculation (min) (ACUTE ONLY): 39 min  Charges:  $Gait Training: 23-37 mins $Self Care/Home Management: 8-22                     Karma Ganja, PT, DPT   Acute Rehabilitation Department Pager #: [phone]   Otho Bellows 10/17/2019, 12:21 PM

## 2019-10-17 NOTE — TOC Transition Note (Signed)
Transition of Care [facility]) - CM/SW Discharge Note   Patient Details  Name: Marletta Bousquet MRN: [id number] Date of Birth: [birth year]  [facility] CM/SW Contact:  Carles Collet, RN Phone Number: 10/17/2019, 11:16 AM   Clinical Narrative:    Damaris Schooner w patient at bedside. She states that she would like to use [facility]. She will be returning home w help of family. No DME needs.  Referral accepted by [facility]    Final next level of care: [place] Barriers to Discharge: No Barriers Identified   Patient Goals and CMS Choice Patient states their goals for this hospitalization and ongoing recovery are:: to go home CMS Medicare.gov Compare Post Acute Care list provided to:: Patient Choice offered to / list presented to : Patient  Discharge Placement                       Discharge Plan and Services                          HH Arranged: PT, OT, Nurse's Aide [place] Agency: [place] Date [facility] Agency Contacted: 10/17/19 Time [place]: 68 Representative spoke with at [place]: [place] ([place]) Interventions     Readmission Risk Interventions No flowsheet data found.

## 2019-10-19 ENCOUNTER — Other Ambulatory Visit: Payer: Self-pay | Admitting: Cardiovascular Disease

## 2019-10-20 ENCOUNTER — Encounter: Payer: Self-pay | Admitting: Cardiovascular Disease

## 2019-10-27 ENCOUNTER — Telehealth: Payer: Self-pay | Admitting: Cardiovascular Disease

## 2019-10-27 NOTE — Telephone Encounter (Signed)
Spoke with pt, she reports swelling and pitting edema in both legs. Her weight is up 8 lbs from 6/30. She reports SOB when lying down even with CPAP. She reports severe pain in the back of her head that goes down into her jaw. She gets no relief with pain medications. She has taken extra torsemide for the last 4 days with no improvement. She reports her heart racing after 5-6 steps. Her bp today is 122/80 with pulse of 98 bpm. Advised patient to go to the ER for evaluation.

## 2019-10-27 NOTE — Telephone Encounter (Signed)
Pt c/o of Chest Pain: STAT if CP now or developed within 24 hours  1. Are you having CP right now? No  2. Are you experiencing any other symptoms (ex. SOB, nausea, vomiting, sweating)? Yes....sweating, nausea, sob, and rapid heart rate in 140's.   3. How long have you been experiencing CP? Since getting out of the hospital on 6/24.   4. Is your CP continuous or coming and going? It came and went. She states it felt really tight when moving around.   5. Have you taken Nitroglycerin? No ? Was taking pepto-bismol to help relieve the chest pain, and mucinex.     Pt c/o swelling: STAT is pt has developed SOB within 24 hours  1) How much weight have you gained and in what time span? 8lbs in less than a week, it started after patient left the hospital on 6/24.  2) If swelling, where is the swelling located? Knees and below   3) Are you currently taking a fluid pill? Yes, taking 2 torsemide in morning and afternoon. Was told by Dr. Justin Mend at [facility] to take an extra torsemide, and then if next day still retaining fluid to take a max of 2 extra torsemide.   4) Are you currently SOB? Yes, it is worse when moving around. Patient went to go check weight, when she came back to the phone, she was noticeably SOB.   5) Do you have a log of your daily weights (if so, list)?                   263 on 6/30                  270.3 on 7/5                  270.6 on 7/6   6) Have you gained 3 pounds in a day or 5 pounds in a week? Yes  7) Have you traveled recently? No

## 2019-12-08 ENCOUNTER — Other Ambulatory Visit: Payer: Self-pay

## 2019-12-08 MED ORDER — TORSEMIDE 20 MG PO TABS: 40.0000 mg | ORAL_TABLET | Freq: Two times a day (BID) | ORAL | 3 refills | Status: DC

## 2019-12-17 ENCOUNTER — Encounter: Payer: Medicare Other | Admitting: Physician Assistant

## 2019-12-19 NOTE — Progress Notes (Signed)
This encounter was created in error - please disregard.

## 2020-01-21 ENCOUNTER — Other Ambulatory Visit: Payer: Self-pay | Admitting: Cardiovascular Disease

## 2020-01-22 NOTE — Telephone Encounter (Signed)
Prescription refill request for Eliquis received. Indication:  Atrial Fibrillation Last office visit: 09/2019 Claiborne Billings Scr:2.14 09/2019 Age: 65 Weight: 121.8 kg  Prescription refilled

## 2020-02-08 ENCOUNTER — Other Ambulatory Visit: Payer: Self-pay

## 2020-02-08 MED ORDER — CARVEDILOL 25 MG PO TABS
25.0000 mg | ORAL_TABLET | Freq: Two times a day (BID) | ORAL | 0 refills | Status: DC
Start: 2020-02-08 — End: 2020-12-20

## 2020-02-08 MED ORDER — ISOSORBIDE MONONITRATE ER 60 MG PO TB24
60.0000 mg | ORAL_TABLET | Freq: Every day | ORAL | 0 refills | Status: DC
Start: 2020-02-08 — End: 2020-04-05

## 2020-02-08 MED ORDER — ATORVASTATIN CALCIUM 40 MG PO TABS
40.0000 mg | ORAL_TABLET | Freq: Every day | ORAL | 0 refills | Status: DC
Start: 2020-02-08 — End: 2020-10-03

## 2020-02-08 MED ORDER — HYDRALAZINE HCL 10 MG PO TABS
10.0000 mg | ORAL_TABLET | Freq: Three times a day (TID) | ORAL | 0 refills | Status: DC
Start: 2020-02-08 — End: 2020-12-20

## 2020-02-08 MED ORDER — AMILORIDE HCL 5 MG PO TABS
5.0000 mg | ORAL_TABLET | Freq: Every morning | ORAL | 0 refills | Status: DC
Start: 2020-02-08 — End: 2020-12-20

## 2020-02-08 NOTE — Telephone Encounter (Signed)
[facility] for the refill 90 day supply now. I verified them with [facility] record. Virtual visit in 1 month with TK

## 2020-03-09 ENCOUNTER — Other Ambulatory Visit: Payer: Self-pay

## 2020-03-09 ENCOUNTER — Telehealth: Payer: Medicare Other | Admitting: Physician Assistant

## 2020-03-09 ENCOUNTER — Encounter: Payer: Self-pay | Admitting: Cardiovascular Disease

## 2020-03-09 ENCOUNTER — Ambulatory Visit (INDEPENDENT_AMBULATORY_CARE_PROVIDER_SITE_OTHER): Payer: Medicare Other | Admitting: Cardiovascular Disease

## 2020-03-09 VITALS — Ht 63.5 in | Wt 249.0 lb

## 2020-03-09 VITALS — BP 104/78 | HR 94 | Ht 63.5 in | Wt 249.0 lb

## 2020-03-09 DIAGNOSIS — N184 Chronic kidney disease, stage 4 (severe): Secondary | ICD-10-CM | POA: Diagnosis not present

## 2020-03-09 DIAGNOSIS — R600 Localized edema: Secondary | ICD-10-CM

## 2020-03-09 DIAGNOSIS — E039 Hypothyroidism, unspecified: Secondary | ICD-10-CM | POA: Diagnosis not present

## 2020-03-09 DIAGNOSIS — I480 Paroxysmal atrial fibrillation: Secondary | ICD-10-CM

## 2020-03-09 DIAGNOSIS — Z9989 Dependence on other enabling machines and devices: Secondary | ICD-10-CM

## 2020-03-09 DIAGNOSIS — I428 Other cardiomyopathies: Secondary | ICD-10-CM

## 2020-03-09 DIAGNOSIS — Z7901 Long term (current) use of anticoagulants: Secondary | ICD-10-CM

## 2020-03-09 DIAGNOSIS — Z79899 Other long term (current) drug therapy: Secondary | ICD-10-CM

## 2020-03-09 DIAGNOSIS — I10 Essential (primary) hypertension: Secondary | ICD-10-CM

## 2020-03-09 DIAGNOSIS — G4733 Obstructive sleep apnea (adult) (pediatric): Secondary | ICD-10-CM

## 2020-03-09 NOTE — Progress Notes (Signed)
Cardiology Office Note    Date:  03/09/2020   ID:  Caitlyn Fuentes, DOB Jan 14, 1956, MRN [id number]  PCP:  Patient, No Pcp Per  Cardiologist:  Caitlyn Majestic, MD   Follow-up of recent [facility] hospitalization.  History of Present Illness:   Caitlyn Fuentes is a 64 y.o. female who initially presented with atrial fibrillation in 2008 was found to have a nonischemic cardiomyopathy. Cardiac catheterization did not show significant coronary obstructive disease and she was found to have mild pulmonary hypertension.    She has obstructive sleep apnea documented since 2008 and has been using CPAP.  She had undergone a follow-up sleep study in 2013, but never follow-up with getting a new CPAP machine.  She uses [employer] for her DME company.    Additional problems include with obesity, hypertension, mild renal insufficiency, as well as lower extremity edema.  There also is a history of lupus as well as gout.  She has lower extremity venous reflux disease with deep venous insufficiency within the left common femoral vein and right and left greater saphenous veins have demonstrated valvular insufficiency, as well as the left short saphenous vein.     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   When I saw her in July 2017, she was unaware of any recurrent atrial fibrillation.  He states that she had seen a neurologist.  She had not been using CPAP due to her machine malfunction.  He referred her for a another sleep study, which was interpreted in October 2017 by Dr. Annamaria Fuentes which showed severe sleep apnea with an AHI of 33.7.  AHI during REM sleep was 48.  She dropped her oxygen to 86% and there was moderate snoring.  She subsequently underwent a CPAP titration was felt that her optimal CPAP pressure was 9 cm. she had never seen Dr. Annamaria Fuentes and when I last saw her in Fu[place].  She requested that I follow  her sleep apnea and recommended her DME company to contact her regarding a new machine.   Since I  saw her in Fuentes 2018, she was hospitalized with volume excess and had seen Caitlyn Fuentes prior to her admission.  There was some concern for possible stroke but this was ruled out.  A head CT was negative as was an MRI.  Repeat echo showed EF at 40-45%.  Carotid ultrasound was negative for significant ICA stenosis.  He had significant dehydration and over diuresis and her serum creatinine had increased to 2.6 and peaked at 2.8.  Rx were held and ultimately resumed at half dose at discharge.  She saw Caitlyn Fuentes back in follow-up on 08/28/2016.   When I saw her in June 2018 she was very tearful and obviously depressed.  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  She also has been seen by Dr. Justin Fuentes for renal follow-up and her creatinine had improved to 1.7.  She was maintaining sinus rhythm without recurrent AF.  Since I last saw her, she was seen in September, October, and December 2018 by Almyra Deforest, [facility].  She had gained over 20 pounds and had noticed increasing swelling of her arms and legs.  He increase torsemide to 40 mg twice a day.  He recommended a 2 day Lexiscan Myoview study.  Her blood pressure was elevated and with her history of a nonischemic myopathy  isosorbide 30 mg was initiated.   [place] study showed an EF of 45%.  There was a questionable inferolateral defect without associated ischemia.   When I saw her in January 2019 she had lost lost 15 pounds.  She wasback using CPAP therapy.  She saw Dr. Edrick Fuentes in follow-up.  She denied any chest pain continue to be on Coreg CR 80 mg, hydralazine 50 mg twice a day, isosorbide 30 mg and torsemide 40 mg daily.  She was taking metolazone 1 day/week and was on Eliquis.  She was maintaining sinus rhythm.   I last saw her in October 2019 at which time she was having  difficulty with osteoarthritis  particularly involving her pelvis hands and knees.  She has been under pain management with Dr. Tedd Fuentes.  She admits to occasional swelling and has been taking metolazone 3 times per week.  She has been unaware of any abnormal rhythm.  She was using CPAP but her husband states she  had instances where she continued to snore on her current pressure.     I had not seen her since October 2019 and last evaluated her in a telemedicine visit on September 25, 2019.  She underwent a follow-up echo Doppler study in September 2020 which was technically difficult due to poor acoustical windows.  EF was 40 to 45%.  Her LV was mild to moderately dilated.  Recently she has had problems with gout as well as her kidneys.  She sees Dr. Justin Fuentes for stage IV chronic kidney disease and has been seeing Dr.Belfi at [facility] and has received [place].  She continues to have issues with leg swelling and continues to take metolazone on Monday Wednesday and Friday.  She denies chest pain.  She is now followed by Dr. Salina Fuentes at [facility] for primary care.  She continues to use CPAP and is in need for new supplies.  I obtained a new download today from May 5 through September 24, 2019 which confirms 100% compliance.  Average usage is 9 hours and 13 minutes per evening.  At a set pressure of 9 cm AHI was excellent at 1.9.  There is no significant leak.   Since I last saw her she apparently was hospitalized twice.  Initially at [facility] after she was admitted after a fall.  At that time, she was felt to have metabolic disorder with hypokalemia at 2.6, hypomagnesemia at 1.2, acute on chronic renal failure with creatinine 3.34.  She was felt to have over diuresed and was given fluid resuscitation and electrolyte replacement.  She was advised to stop taking her metolazone.  In July she was hospitalized in [facility] and was felt to have increasing shortness of breath lower extremity edema.  She was started on  amiloride 5 mg, her carvedilol was changed to 25 mg twice a day, she was on isosorbide 60 mg.  She was told to continue atorvastatin 40 mg, hydralazine 10 mg every 8 hours but apparently she has only been taking this once a day, and continues to be on Neurontin, levothyroxine and torsemide 20 mg on Monday Wednesday and Friday.  Over the past several months, she has slowly improved.  She continues to wrap her legs daily to prevent edema.  She walks with a walker.  She has continued to use CPAP and a download obtained in the office today shows an AHI of 4.2 with average usage at 8 hours per night and average 9  cm set pressure.  She was supposed to be seen in a virtual visit today with Almyra Deforest, PA but apparently showed up in the office and it was added onto my schedule to be seen.  Past Medical History:  Diagnosis Date   A-fib ([place])    Cardiomyopathy    Hyperlipidemia    Hypertension    Lupus ([place])    Pulmonary hypertension ([place]) 05/10/2011   Echo, EF-40-45   Renal disorder    Sleep apnea 2008   CPAP, pt does not know settings   Thyroid disease     Past Surgical History:  Procedure Laterality Date    c sections  x 2   ABDOMINAL HYSTERECTOMY     complete   CARDIAC CATHETERIZATION     Nonischemic, EF-40, continued medical therapy   CARDIAC CATHETERIZATION  10/02/2006   No significant CAD   COLONOSCOPY WITH PROPOFOL N/A 11/22/2015   Procedure: COLONOSCOPY WITH PROPOFOL;  Surgeon: Juanita Craver, MD;  Location: [facility];  Service: Endoscopy;  Laterality: N/A;   ESOPHAGOGASTRODUODENOSCOPY (EGD) WITH PROPOFOL N/A 11/22/2015   Procedure: ESOPHAGOGASTRODUODENOSCOPY (EGD) WITH PROPOFOL;  Surgeon: Juanita Craver, MD;  Location: [facility] ENDOSCOPY;  Service: Endoscopy;  Laterality: N/A;    Current Medications: Outpatient Medications Prior to Visit  Medication Sig Dispense Refill   acetaminophen (TYLENOL) 500 MG tablet Take 500-1,000 mg by mouth every 6 (six) hours as needed for mild pain or headache.      Albuterol Sulfate (PROAIR RESPICLICK) 659 (90 Base) MCG/ACT AEPB Inhale 2 puffs into the lungs daily as needed (for shortness of brath or wheezing).     aMILoride (MIDAMOR) 5 MG tablet Take 1 tablet (5 mg total) by mouth every morning. 90 tablet 0   atorvastatin (LIPITOR) 40 MG tablet Take 1 tablet (40 mg total) by mouth daily. 90 tablet 0   Azelaic Acid 15 % cream Apply 1 application topically 2 (two) times daily as needed (to affected areas of the face).      carvedilol (COREG) 25 MG tablet Take 1 tablet (25 mg total) by mouth 2 (two) times daily. 180 tablet 0   clotrimazole (LOTRIMIN) 1 % cream Apply 1 application topically 2 (two) times daily.     colchicine 0.6 MG tablet Take 0.6 mg by mouth daily.     cyclobenzaprine (FLEXERIL) 10 MG tablet Take 10 mg by mouth 3 (three) times daily as needed for muscle spasms.      ELIQUIS 5 MG TABS tablet TAKE 1 TABLET (5 MG TOTAL) BY MOUTH 2 (TWO) TIMES DAILY. PT DUE FOR OV PLEASE CALL FOR APPT 180 tablet 1   febuxostat (ULORIC) 40 MG tablet Take 40 mg by mouth in the morning.     ferrous sulfate 325 (65 FE) MG tablet Take 325 mg by mouth every morning.     fluticasone (FLONASE) 50 MCG/ACT nasal spray Place 2 sprays into both nostrils daily.     gabapentin (NEURONTIN) 100 MG capsule Take 100 mg by mouth.      hydrALAZINE (APRESOLINE) 10 MG tablet Take 1 tablet (10 mg total) by mouth every 8 (eight) hours. 90 tablet 0   HYDROcodone-acetaminophen (NORCO/VICODIN) 5-325 MG tablet Take 1 tablet by mouth every 6 (six) hours as needed for moderate pain.     isosorbide mononitrate (IMDUR) 60 MG 24 hr tablet Take 1 tablet (60 mg total) by mouth daily. 90 tablet 0   levothyroxine (SYNTHROID) 75 MCG tablet Take 75 mcg by mouth every morning.  predniSONE (STERAPRED UNI-PAK 21 TAB) 5 MG (21) TBPK tablet      torsemide (DEMADEX) 20 MG tablet Take 2 tablets (40 mg total) by mouth 2 (two) times daily. *NEEDS OFFICE VISIT FOR FURTHER REFILLS* 120 tablet 3    predniSONE (DELTASONE) 5 MG tablet Take 5-30 mg by mouth See admin instructions. Take 30 mg by mouth once daily on days 1 & 2, 25 mg once daily on days 3 & 4, 20 mg once daily on days 5 & 6, 15 mg once daily on days 7 & 8, 10 mg once daily on days 9 & 10, and 5 mg once daily on days 11 & 12 to complete a 12-day's course     doxycycline (VIBRA-TABS) 100 MG tablet SMARTSIG:1 Tablet(s) By Mouth Every 12 Hours     DULoxetine (CYMBALTA) 30 MG capsule Take 30 mg by mouth 2 (two) times daily. (Patient not taking: Reported on 03/09/2020)     methocarbamol (ROBAXIN) 500 MG tablet Take 500 mg by mouth every 6 (six) hours as needed.  (Patient not taking: Reported on 03/09/2020)     metolazone (ZAROXOLYN) 2.5 MG tablet Take 1 tablet by mouth as directed. (Patient not taking: Reported on 03/09/2020)  2   omega-3 acid ethyl esters (LOVAZA) 1 g capsule Take 1 capsule (1 g total) by mouth daily. (Patient not taking: Reported on 03/09/2020) 90 capsule 3   zolpidem (AMBIEN) 10 MG tablet Take 10 mg by mouth at bedtime as needed for sleep.  (Patient not taking: Reported on 03/09/2020)     No facility-administered medications prior to visit.     Allergies:   Diovan [valsartan], Lisinopril, Lisinopril, Tape, Diovan [valsartan], Metolazone, and Zaroxolyn [metolazone]   Social History   Socioeconomic History   Marital status: Married    Spouse name: Not on file   Number of children: Not on file   Years of education: Not on file   Highest education level: Not on file  Occupational History   Not on file  Tobacco Use   Smoking status: Never Smoker   Smokeless tobacco: Never Used  Vaping Use   Vaping Use: Never used  Substance and Sexual Activity   Alcohol use: Not Currently    Comment: once a month   Drug use: Never   Sexual activity: Not on file  Other Topics Concern   Not on file  Social History Narrative   ** Merged History Encounter **       Social Determinants of Health   Financial Resource  Strain:    Difficulty of Paying Living Expenses: Not on file  Food Insecurity:    Worried About Charity fundraiser in the Last Year: Not on file   [employer] of Food in the Last Year: Not on file  Transportation Needs:    Lack of Transportation (Medical): Not on file   Lack of Transportation (Non-Medical): Not on file  Physical Activity:    Days of Exercise per Week: Not on file   Minutes of Exercise per Session: Not on file  Stress:    Feeling of Stress : Not on file  Social Connections:    Frequency of Communication with Friends and Family: Not on file   Frequency of Social Gatherings with Friends and Family: Not on file   Attends Religious Services: Not on file   Active Member of Clubs or Organizations: Not on file   Attends Archivist Meetings: Not on file   Marital Status: Not on  file     Family History:  The patient's family history includes Heart disease in her brother, father, and mother; Hypertension in her sister; Lung disease in her sister.   ROS General: Negative; No fevers, chills, or night sweats;  HEENT: Negative; No changes in vision or hearing, sinus congestion, difficulty swallowing Pulmonary: Negative; No cough, wheezing, shortness of breath, hemoptysis Cardiovascular: See HPI GI: Negative; No nausea, vomiting, diarrhea, or abdominal pain GU: Negative; No dysuria, hematuria, or difficulty voiding Musculoskeletal: Negative; no myalgias, joint pain, or weakness Hematologic/Oncology: Negative; no easy bruising, bleeding Endocrine: Negative; no heat/cold intolerance; no diabetes Neuro: Negative; no changes in balance, headaches Skin: Negative; No rashes or skin lesions Psychiatric: Negative; No behavioral problems, depression Sleep: OSA on CPAP  Other comprehensive 14 point system review is negative.   PHYSICAL EXAM:   VS:  BP 104/78   Pulse 94   Ht 5' 3.5" (1.613 m)   Wt 249 lb (112.9 kg)   BMI 43.42 kg/m    Wt Readings from Last 3 Encounters:   03/09/20 249 lb (112.9 kg)  03/09/20 249 lb (112.9 kg)  10/16/19 268 lb 8.3 oz (121.8 kg)    General: Alert, oriented, no distress.  Skin: normal turgor, no rashes, warm and dry HEENT: Normocephalic, atraumatic. Pupils equal round and reactive to light; sclera anicteric; extraocular muscles intact; Nose without nasal septal hypertrophy Mouth/Parynx benign; Mallinpatti scale 3 Neck: No JVD, no carotid bruits; normal carotid upstroke Lungs: clear to ausculatation and percussion; no wheezing or rales Chest wall: without tenderness to palpitation Heart: PMI not displaced, RRR, s1 s2 normal, 1/6 systolic murmur, no diastolic murmur, no rubs, gallops, thrills, or heaves Abdomen: soft, nontender; no hepatosplenomehaly, BS+; abdominal aorta nontender and not dilated by palpation. Back: no CVA tenderness Pulses 2+ Musculoskeletal: full range of motion, normal strength, no joint deformities Extremities: Bilateral leg wrappings; no clubbing cyanosis, Homan's sign negative  Neurologic: grossly nonfocal; Cranial nerves grossly wnl Psychologic: Normal mood and affect   Studies/Labs Reviewed:   EKG:  EKG is ordered today.  ECG (independently read by me): Normal sinus rhythm at 94;  left axis deviation, LVH by voltage criteria.  Nonspecific ST changes.  Recent Labs: BMP Latest Ref Rng & Units 10/17/2019 10/16/2019 10/16/2019  Glucose 70 - 99 mg/dL 136(H) 112(H) 191(H)  BUN 8 - 23 mg/dL 80(H) 96(H) 108(H)  Creatinine 0.44 - 1.00 mg/dL 2.14(H) 2.32(H) 2.59(H)  BUN/Creat Ratio 12 - 28 - - -  Sodium 135 - 145 mmol/L 145 139 138  Potassium 3.5 - 5.1 mmol/L 4.4 3.8 3.1(L)  Chloride 98 - 111 mmol/L 103 99 94(L)  CO2 22 - 32 mmol/L _0 Calcium 8.9 - 10.3 mg/dL 9.9 9.7 9.4     Hepatic Function Latest Ref Rng & Units 10/15/2019 08/18/2016 08/17/2016  Total Protein 6.5 - 8.1 g/dL 7.6 7.9 8.3(H)  Albumin 3.5 - 5.0 g/dL 4.3 4.4 4.6  AST 15 - 41 U/L 39 25 25  ALT 0 - 44 U/L 34 16 19  Alk Phosphatase  38 - 126 U/L 71 95 103  Total Bilirubin 0.3 - 1.2 mg/dL 1.2 0.7 0.8    CBC Latest Ref Rng & Units 10/16/2019 10/15/2019 10/15/2019  WBC 4.0 - 10.5 K/uL 7.0 - 6.7  Hemoglobin 12.0 - 15.0 g/dL 11.0(L) 13.3 13.1  Hematocrit 36 - 46 % 33.0(L) 39.0 38.9  Platelets 150 - 400 K/uL 304 - 355   Lab Results  Component Value Date   MCV 91.4  10/16/2019   MCV 89.8 10/15/2019   MCV 91 01/18/2017   Lab Results  Component Value Date   TSH 0.688 10/15/2019   Lab Results  Component Value Date   HGBA1C 4.8 08/18/2016     BNP    Component Value Date/Time   BNP 21.0 08/17/2016 1250   BNP 165.4 (H) 05/25/2015 1059    ProBNP    Component Value Date/Time   PROBNP 1,561 (H) 01/18/2017 0944   PROBNP 289.4 (H) 08/16/2011 1320     Lipid Panel     Component Value Date/Time   CHOL 126 08/18/2016 0827   TRIG 178 (H) 08/18/2016 0827   HDL 39 (L) 08/18/2016 0827   CHOLHDL 3.2 08/18/2016 0827   VLDL 36 08/18/2016 0827   LDLCALC 51 08/18/2016 0827     RADIOLOGY: No results found.   Additional studies/ records that were reviewed today include:       ECHO: 12/25/2018 IMPRESSIONS   1. The left ventricle has mild-moderately reduced systolic function, with  an ejection fraction of 40-45%. The cavity size was mild to moderately  dilated. Left ventricular diastolic Doppler parameters are indeterminate.  No evidence of left ventricular  regional wall motion abnormalities.   2. The mitral valve is abnormal. Mild thickening of the mitral valve  leaflet.   3. The aortic valve was not well visualized. No stenosis of the aortic  valve.   4. The aorta is normal unless otherwise noted.   5. When compared to the prior study: 08/19/2016: LVEF 40-45%.     Reported echo Doppler study from [facility] admission in July 2021 reported an EF of 30 to 35%.  A download of her CPAP was obtained in the office today from October 18 through March 08, 2020.  Usage was 100%.  Average use 8 hours.   At a 9 cm set pressure, AHI 4.2.   ASSESSMENT:    1. NICM (nonischemic cardiomyopathy) ([place])   2. PAF (paroxysmal atrial fibrillation) ([place])   3. Hypothyroidism, unspecified type   4. Chronic kidney disease (CKD), stage IV (severe) ([place])   5. OSA on CPAP   6. Essential hypertension   7. Leg edema   8. Morbid obesity due to excess calories ([place])   9. Long term current use of anticoagulant therapy     PLAN:  1.  Nonischemic cardiomyopathy: Previous echo Doppler study in September 2020 showed an EF of 40 to 45%.  Patient had been recently admitted to [facility] reported EF was 30 to 35%.  Her medications were adjusted and she is now on amiloride 5 mg, carvedilol 25 mg twice a day, isosorbide 60 mg, hydralazine 10 mg but she is only taking this daily, and torsemide 20 mg on Monday Wednesday and Friday.  I have recommended titration of hydralazine to every 8 hours as her blood pressure allows.  She is not having any chest pain.  With her chronic kidney disease she is not a candidate for Entresto or spironolactone presently.  2.  Recent acute on chronic kidney disease.  Following her hospitalization she was scheduled to see Dr. Berkley Harvey following her hospitalization.  In the past she had seen Dr. Justin Fuentes.  Creatinine had increased with over 3 during her June hospitalization.  We will repeat comprehensive metabolic panel, CBC and BNP today.  3.  Paroxysmal atrial fibrillation: ECG today shows sinus rhythm.  She continues to be on Eliquis  4. Obstructive sleep apnea: A download was  obtained today.  I will slightly increase her CPAP pressure to 10 cm with her AHI at 4.2.  5.  Lower extremity edema: She continues to be on torsemide now 20 mg Monday Wednesday and Friday in addition to amiloride.  She wraps her legs on a daily basis.  6.  Morbid obesity: BMI 43.42 weight loss recommended.  7.  Hypothyroidism: On levothyroxine.  She will have a follow-up visit withHao Meng, PA in  6 to 8 weeks and me in 3 to 4 months.  Medication Adjustments/Labs and Tests Ordered: Current medicines are reviewed at length with the patient today.  Concerns regarding medicines are outlined above.  Medication changes, Labs and Tests ordered today are listed in the Patient Instructions below. There are no Patient Instructions on file for this visit.   Signed, Caitlyn Majestic, MD  03/09/2020 1:07 PM    [facility] [address] [place], [place], [place]  [address] Phone: [phone]

## 2020-03-09 NOTE — Patient Instructions (Signed)
Medication Instructions:  TAKE- Hydralazine 10 mg by mouth 3 times a day  *If you need a refill on your cardiac medications before your next appointment, please call your pharmacy*   Lab Work: CMP, BMP and CBC  If you have labs (blood work) drawn today and your tests are completely normal, you will receive your results only by:  [place] (if you have MyChart) OR  A paper copy in the mail If you have any lab test that is abnormal or we need to change your treatment, we will call you to review the results.   Testing/Procedures: None Ordered   Follow-Up: At [facility], you and your health needs are our priority.  As part of our continuing mission to provide you with exceptional heart care, we have created designated Provider Care Teams.  These Care Teams include your primary Cardiologist (physician) and Advanced Practice Providers (APPs -  Physician Assistants and Nurse Practitioners) who all work together to provide you with the care you need, when you need it.  We recommend signing up for the patient portal called "MyChart".  Sign up information is provided on this After Visit Summary.  MyChart is used to connect with patients for Virtual Visits (Telemedicine).  Patients are able to view lab/test results, encounter notes, upcoming appointments, etc.  Non-urgent messages can be sent to your provider as well.   To learn more about what you can do with MyChart, go to [email or web address].    Your next appointment:   6 weeks  The format for your next appointment:   In Person  Provider:     Almyra Deforest, PA-C

## 2020-03-10 LAB — COMPREHENSIVE METABOLIC PANEL
ALT: 47 IU/L — ABNORMAL HIGH (ref 0–32)
AST: 18 IU/L (ref 0–40)
Albumin/Globulin Ratio: 1.3 (ref 1.2–2.2)
Albumin: 3.6 g/dL — ABNORMAL LOW (ref 3.8–4.8)
Alkaline Phosphatase: 70 IU/L (ref 44–121)
BUN/Creatinine Ratio: 17 (ref 12–28)
BUN: 28 mg/dL — ABNORMAL HIGH (ref 8–27)
Bilirubin Total: 0.4 mg/dL (ref 0.0–1.2)
CO2: 21 mmol/L (ref 20–29)
Calcium: 9 mg/dL (ref 8.7–10.3)
Chloride: 103 mmol/L (ref 96–106)
Creatinine, Ser: 1.68 mg/dL — ABNORMAL HIGH (ref 0.57–1.00)
GFR calc Af Amer: 37 mL/min/{1.73_m2} — ABNORMAL LOW (ref >59)
GFR calc non Af Amer: 32 mL/min/{1.73_m2} — ABNORMAL LOW (ref >59)
Globulin, Total: 2.7 g/dL (ref 1.5–4.5)
Glucose: 86 mg/dL (ref 65–99)
Potassium: 4.2 mmol/L (ref 3.5–5.2)
Sodium: 140 mmol/L (ref 134–144)
Total Protein: 6.3 g/dL (ref 6.0–8.5)

## 2020-03-10 LAB — CBC
Hematocrit: 32.9 % — ABNORMAL LOW (ref 34.0–46.6)
Hemoglobin: 10.9 g/dL — ABNORMAL LOW (ref 11.1–15.9)
MCH: 30 pg (ref 26.6–33.0)
MCHC: 33.1 g/dL (ref 31.5–35.7)
MCV: 91 fL (ref 79–97)
Platelets: 477 10*3/uL — ABNORMAL HIGH (ref 150–450)
RBC: 3.63 x10E6/uL — ABNORMAL LOW (ref 3.77–5.28)
RDW: 15 % (ref 11.7–15.4)
WBC: 5.2 10*3/uL (ref 3.4–10.8)

## 2020-03-23 ENCOUNTER — Telehealth: Payer: Self-pay | Admitting: Cardiovascular Disease

## 2020-03-23 DIAGNOSIS — Z79899 Other long term (current) drug therapy: Secondary | ICD-10-CM

## 2020-03-23 NOTE — Telephone Encounter (Signed)
Returned call to pt she states that she was concerned about the elevated ALT. Informed pt that if this was of concern Dr Claiborne Billings would have informed us what to do. Pt would like message forwarded to Dr Claiborne Billings for his answer.

## 2020-03-23 NOTE — Telephone Encounter (Signed)
Patient calling to receive results. Please call

## 2020-03-24 NOTE — Telephone Encounter (Signed)
AST was normal.  ALT was only minimally increased.  Recommend recheck hepatic panel in 4 weeks

## 2020-03-24 NOTE — Telephone Encounter (Signed)
Spoke with the pt to relay Dr. Evette Georges orders for repeat liver function tests in approximately 4 weeks. Order placed and requisition forms mailed to the patient. Confirmed follow up appointment with Almyra Deforest, PA-C on 04/21/20. The patient verbalizes understanding and agreement with plan.

## 2020-03-28 ENCOUNTER — Other Ambulatory Visit: Payer: Self-pay | Admitting: Cardiovascular Disease

## 2020-04-01 ENCOUNTER — Other Ambulatory Visit: Payer: Self-pay | Admitting: Physician Assistant

## 2020-04-21 ENCOUNTER — Encounter: Payer: Medicare Other | Admitting: Physician Assistant

## 2020-04-21 NOTE — Progress Notes (Signed)
This encounter was created in error - please disregard.

## 2020-05-31 ENCOUNTER — Other Ambulatory Visit: Payer: Self-pay | Admitting: Physician Assistant

## 2020-06-27 ENCOUNTER — Other Ambulatory Visit: Payer: Self-pay | Admitting: Cardiovascular Disease

## 2020-06-27 NOTE — Telephone Encounter (Signed)
Prescription refill request for Eliquis received. Indication:atrial fibrillation Last office visit:11/21 kelly Scr:1.6 11/21 Age: 65 Weight:112.9 kg  Prescription refilled

## 2020-06-28 NOTE — Telephone Encounter (Signed)
Insurance preference is for Xarelto.  Spoke with patient and explained differences in medication and how to transition from Eliquis.  Patient agreeable to switch and voiced understanding.

## 2020-07-14 ENCOUNTER — Other Ambulatory Visit: Payer: Self-pay | Admitting: Cardiovascular Disease

## 2020-09-30 ENCOUNTER — Other Ambulatory Visit: Payer: Self-pay | Admitting: Cardiovascular Disease

## 2020-10-25 ENCOUNTER — Other Ambulatory Visit: Payer: Self-pay | Admitting: Cardiovascular Disease

## 2020-11-20 ENCOUNTER — Inpatient Hospital Stay (HOSPITAL_BASED_OUTPATIENT_CLINIC_OR_DEPARTMENT_OTHER)
Admission: EM | Admit: 2020-11-20 | Discharge: 2020-12-20 | DRG: 291 | Disposition: A | Discharge status: Hospice | Payer: Medicare Other | Attending: Internal Medicine | Admitting: Internal Medicine

## 2020-11-20 ENCOUNTER — Emergency Department (HOSPITAL_BASED_OUTPATIENT_CLINIC_OR_DEPARTMENT_OTHER): Payer: Medicare Other

## 2020-11-20 ENCOUNTER — Encounter (HOSPITAL_BASED_OUTPATIENT_CLINIC_OR_DEPARTMENT_OTHER): Payer: Self-pay

## 2020-11-20 ENCOUNTER — Inpatient Hospital Stay (HOSPITAL_COMMUNITY): Payer: Medicare Other

## 2020-11-20 ENCOUNTER — Other Ambulatory Visit: Payer: Self-pay

## 2020-11-20 DIAGNOSIS — I272 Pulmonary hypertension, unspecified: Secondary | ICD-10-CM | POA: Diagnosis present

## 2020-11-20 DIAGNOSIS — I5023 Acute on chronic systolic (congestive) heart failure: Secondary | ICD-10-CM | POA: Diagnosis present

## 2020-11-20 DIAGNOSIS — E876 Hypokalemia: Secondary | ICD-10-CM | POA: Diagnosis present

## 2020-11-20 DIAGNOSIS — Z515 Encounter for palliative care: Secondary | ICD-10-CM | POA: Diagnosis not present

## 2020-11-20 DIAGNOSIS — E785 Hyperlipidemia, unspecified: Secondary | ICD-10-CM | POA: Diagnosis present

## 2020-11-20 DIAGNOSIS — N184 Chronic kidney disease, stage 4 (severe): Secondary | ICD-10-CM

## 2020-11-20 DIAGNOSIS — D696 Thrombocytopenia, unspecified: Secondary | ICD-10-CM | POA: Diagnosis not present

## 2020-11-20 DIAGNOSIS — T82594A Other mechanical complication of infusion catheter, initial encounter: Secondary | ICD-10-CM | POA: Diagnosis not present

## 2020-11-20 DIAGNOSIS — I361 Nonrheumatic tricuspid (valve) insufficiency: Secondary | ICD-10-CM | POA: Diagnosis not present

## 2020-11-20 DIAGNOSIS — R251 Tremor, unspecified: Secondary | ICD-10-CM | POA: Diagnosis not present

## 2020-11-20 DIAGNOSIS — R0689 Other abnormalities of breathing: Secondary | ICD-10-CM | POA: Diagnosis not present

## 2020-11-20 DIAGNOSIS — I4819 Other persistent atrial fibrillation: Secondary | ICD-10-CM | POA: Diagnosis present

## 2020-11-20 DIAGNOSIS — R570 Cardiogenic shock: Secondary | ICD-10-CM | POA: Diagnosis present

## 2020-11-20 DIAGNOSIS — J449 Chronic obstructive pulmonary disease, unspecified: Secondary | ICD-10-CM | POA: Diagnosis present

## 2020-11-20 DIAGNOSIS — Z79890 Hormone replacement therapy (postmenopausal): Secondary | ICD-10-CM

## 2020-11-20 DIAGNOSIS — I428 Other cardiomyopathies: Secondary | ICD-10-CM | POA: Diagnosis present

## 2020-11-20 DIAGNOSIS — I4891 Unspecified atrial fibrillation: Secondary | ICD-10-CM | POA: Diagnosis present

## 2020-11-20 DIAGNOSIS — Z7189 Other specified counseling: Secondary | ICD-10-CM

## 2020-11-20 DIAGNOSIS — I5043 Acute on chronic combined systolic (congestive) and diastolic (congestive) heart failure: Secondary | ICD-10-CM | POA: Diagnosis not present

## 2020-11-20 DIAGNOSIS — E871 Hypo-osmolality and hyponatremia: Secondary | ICD-10-CM | POA: Diagnosis not present

## 2020-11-20 DIAGNOSIS — I493 Ventricular premature depolarization: Secondary | ICD-10-CM | POA: Diagnosis present

## 2020-11-20 DIAGNOSIS — I5042 Chronic combined systolic (congestive) and diastolic (congestive) heart failure: Secondary | ICD-10-CM | POA: Diagnosis not present

## 2020-11-20 DIAGNOSIS — G4733 Obstructive sleep apnea (adult) (pediatric): Secondary | ICD-10-CM | POA: Diagnosis present

## 2020-11-20 DIAGNOSIS — D631 Anemia in chronic kidney disease: Secondary | ICD-10-CM | POA: Diagnosis present

## 2020-11-20 DIAGNOSIS — Z888 Allergy status to other drugs, medicaments and biological substances status: Secondary | ICD-10-CM

## 2020-11-20 DIAGNOSIS — Z90710 Acquired absence of both cervix and uterus: Secondary | ICD-10-CM

## 2020-11-20 DIAGNOSIS — R0600 Dyspnea, unspecified: Secondary | ICD-10-CM

## 2020-11-20 DIAGNOSIS — N186 End stage renal disease: Secondary | ICD-10-CM | POA: Diagnosis present

## 2020-11-20 DIAGNOSIS — Z8249 Family history of ischemic heart disease and other diseases of the circulatory system: Secondary | ICD-10-CM

## 2020-11-20 DIAGNOSIS — I340 Nonrheumatic mitral (valve) insufficiency: Secondary | ICD-10-CM | POA: Diagnosis not present

## 2020-11-20 DIAGNOSIS — Z7952 Long term (current) use of systemic steroids: Secondary | ICD-10-CM

## 2020-11-20 DIAGNOSIS — E039 Hypothyroidism, unspecified: Secondary | ICD-10-CM | POA: Diagnosis present

## 2020-11-20 DIAGNOSIS — R531 Weakness: Secondary | ICD-10-CM | POA: Diagnosis not present

## 2020-11-20 DIAGNOSIS — Z20822 Contact with and (suspected) exposure to covid-19: Secondary | ICD-10-CM | POA: Diagnosis present

## 2020-11-20 DIAGNOSIS — I132 Hypertensive heart and chronic kidney disease with heart failure and with stage 5 chronic kidney disease, or end stage renal disease: Principal | ICD-10-CM | POA: Diagnosis present

## 2020-11-20 DIAGNOSIS — M329 Systemic lupus erythematosus, unspecified: Secondary | ICD-10-CM | POA: Diagnosis present

## 2020-11-20 DIAGNOSIS — I959 Hypotension, unspecified: Secondary | ICD-10-CM

## 2020-11-20 DIAGNOSIS — N179 Acute kidney failure, unspecified: Secondary | ICD-10-CM | POA: Diagnosis present

## 2020-11-20 DIAGNOSIS — Z79899 Other long term (current) drug therapy: Secondary | ICD-10-CM

## 2020-11-20 DIAGNOSIS — Z91048 Other nonmedicinal substance allergy status: Secondary | ICD-10-CM

## 2020-11-20 DIAGNOSIS — Z66 Do not resuscitate: Secondary | ICD-10-CM | POA: Diagnosis not present

## 2020-11-20 DIAGNOSIS — D539 Nutritional anemia, unspecified: Secondary | ICD-10-CM | POA: Diagnosis present

## 2020-11-20 DIAGNOSIS — M109 Gout, unspecified: Secondary | ICD-10-CM | POA: Diagnosis present

## 2020-11-20 DIAGNOSIS — I509 Heart failure, unspecified: Secondary | ICD-10-CM

## 2020-11-20 DIAGNOSIS — R0602 Shortness of breath: Secondary | ICD-10-CM

## 2020-11-20 DIAGNOSIS — N170 Acute kidney failure with tubular necrosis: Secondary | ICD-10-CM | POA: Diagnosis not present

## 2020-11-20 DIAGNOSIS — Z452 Encounter for adjustment and management of vascular access device: Secondary | ICD-10-CM

## 2020-11-20 LAB — COMPREHENSIVE METABOLIC PANEL
ALT: 297 U/L — ABNORMAL HIGH (ref 0–44)
AST: 199 U/L — ABNORMAL HIGH (ref 15–41)
Albumin: 2.8 g/dL — ABNORMAL LOW (ref 3.5–5.0)
Alkaline Phosphatase: 76 U/L (ref 38–126)
Anion gap: 14 (ref 5–15)
BUN: 98 mg/dL — ABNORMAL HIGH (ref 8–23)
CO2: 22 mmol/L (ref 22–32)
Calcium: 9.1 mg/dL (ref 8.9–10.3)
Chloride: 98 mmol/L (ref 98–111)
Creatinine, Ser: 3.9 mg/dL — ABNORMAL HIGH (ref 0.44–1.00)
GFR, Estimated: 12 mL/min — ABNORMAL LOW (ref >60)
Glucose, Bld: 150 mg/dL — ABNORMAL HIGH (ref 70–99)
Potassium: 3.1 mmol/L — ABNORMAL LOW (ref 3.5–5.1)
Sodium: 134 mmol/L — ABNORMAL LOW (ref 135–145)
Total Bilirubin: 1.2 mg/dL (ref 0.3–1.2)
Total Protein: 5.6 g/dL — ABNORMAL LOW (ref 6.5–8.1)

## 2020-11-20 LAB — CBC
HCT: 40.2 % (ref 36.0–46.0)
Hemoglobin: 13.6 g/dL (ref 12.0–15.0)
MCH: 32.2 pg (ref 26.0–34.0)
MCHC: 33.8 g/dL (ref 30.0–36.0)
MCV: 95 fL (ref 80.0–100.0)
Platelets: 261 10*3/uL (ref 150–400)
RBC: 4.23 MIL/uL (ref 3.87–5.11)
RDW: 13.6 % (ref 11.5–15.5)
WBC: 6.2 10*3/uL (ref 4.0–10.5)
nRBC: 1 % — ABNORMAL HIGH (ref 0.0–0.2)

## 2020-11-20 LAB — POCT I-STAT 7, (LYTES, BLD GAS, ICA,H+H)
Acid-Base Excess: 0 mmol/L (ref 0.0–2.0)
Bicarbonate: 23.3 mmol/L (ref 20.0–28.0)
Calcium, Ion: 1.13 mmol/L — ABNORMAL LOW (ref 1.15–1.40)
HCT: 40 % (ref 36.0–46.0)
Hemoglobin: 13.6 g/dL (ref 12.0–15.0)
O2 Saturation: 98 %
Patient temperature: 97.6
Potassium: 3.1 mmol/L — ABNORMAL LOW (ref 3.5–5.1)
Sodium: 135 mmol/L (ref 135–145)
TCO2: 24 mmol/L (ref 22–32)
pCO2 arterial: 30.6 mmHg — ABNORMAL LOW (ref 32.0–48.0)
pH, Arterial: 7.487 — ABNORMAL HIGH (ref 7.350–7.450)
pO2, Arterial: 95 mmHg (ref 83.0–108.0)

## 2020-11-20 LAB — BILIRUBIN, DIRECT: Bilirubin, Direct: 0.2 mg/dL (ref 0.0–0.2)

## 2020-11-20 LAB — BASIC METABOLIC PANEL
Anion gap: 14 (ref 5–15)
BUN: 94 mg/dL — ABNORMAL HIGH (ref 8–23)
CO2: 27 mmol/L (ref 22–32)
Calcium: 9.2 mg/dL (ref 8.9–10.3)
Chloride: 96 mmol/L — ABNORMAL LOW (ref 98–111)
Creatinine, Ser: 3.73 mg/dL — ABNORMAL HIGH (ref 0.44–1.00)
GFR, Estimated: 13 mL/min — ABNORMAL LOW (ref >60)
Glucose, Bld: 121 mg/dL — ABNORMAL HIGH (ref 70–99)
Potassium: 3.3 mmol/L — ABNORMAL LOW (ref 3.5–5.1)
Sodium: 137 mmol/L (ref 135–145)

## 2020-11-20 LAB — CBC WITH DIFFERENTIAL/PLATELET
Abs Immature Granulocytes: 0.02 10*3/uL (ref 0.00–0.07)
Basophils Absolute: 0 10*3/uL (ref 0.0–0.1)
Basophils Relative: 0 %
Eosinophils Absolute: 0 10*3/uL (ref 0.0–0.5)
Eosinophils Relative: 1 %
HCT: 43.1 % (ref 36.0–46.0)
Hemoglobin: 14.4 g/dL (ref 12.0–15.0)
Immature Granulocytes: 0 %
Lymphocytes Relative: 16 %
Lymphs Abs: 1.1 10*3/uL (ref 0.7–4.0)
MCH: 31.9 pg (ref 26.0–34.0)
MCHC: 33.4 g/dL (ref 30.0–36.0)
MCV: 95.6 fL (ref 80.0–100.0)
Monocytes Absolute: 0.5 10*3/uL (ref 0.1–1.0)
Monocytes Relative: 7 %
Neutro Abs: 4.8 10*3/uL (ref 1.7–7.7)
Neutrophils Relative %: 76 %
Platelets: 277 10*3/uL (ref 150–400)
RBC: 4.51 MIL/uL (ref 3.87–5.11)
RDW: 13.6 % (ref 11.5–15.5)
WBC: 6.5 10*3/uL (ref 4.0–10.5)
nRBC: 0.9 % — ABNORMAL HIGH (ref 0.0–0.2)

## 2020-11-20 LAB — MAGNESIUM: Magnesium: 2 mg/dL (ref 1.7–2.4)

## 2020-11-20 LAB — LACTIC ACID, PLASMA: Lactic Acid, Venous: 1.6 mmol/L (ref 0.5–1.9)

## 2020-11-20 LAB — TROPONIN I (HIGH SENSITIVITY)
Troponin I (High Sensitivity): 38 ng/L — ABNORMAL HIGH (ref <18)
Troponin I (High Sensitivity): 35 ng/L — ABNORMAL HIGH (ref <18)

## 2020-11-20 LAB — URIC ACID: Uric Acid, Serum: 10.3 mg/dL — ABNORMAL HIGH (ref 2.5–7.1)

## 2020-11-20 LAB — RESP PANEL BY RT-PCR (FLU A&B, COVID) ARPGX2
Influenza A by PCR: NEGATIVE
Influenza B by PCR: NEGATIVE
SARS Coronavirus 2 by RT PCR: NEGATIVE

## 2020-11-20 LAB — GLUCOSE, CAPILLARY
Glucose-Capillary: 119 mg/dL — ABNORMAL HIGH (ref 70–99)
Glucose-Capillary: 145 mg/dL — ABNORMAL HIGH (ref 70–99)

## 2020-11-20 LAB — TSH: TSH: 8.239 u[IU]/mL — ABNORMAL HIGH (ref 0.350–4.500)

## 2020-11-20 LAB — BRAIN NATRIURETIC PEPTIDE: B Natriuretic Peptide: 2051.3 pg/mL — ABNORMAL HIGH (ref 0.0–100.0)

## 2020-11-20 MED ORDER — CYCLOBENZAPRINE HCL 10 MG PO TABS
10.0000 mg | ORAL_TABLET | Freq: Three times a day (TID) | ORAL | Status: DC | PRN
  Filled 2020-11-20 (×2): qty 1

## 2020-11-20 MED ORDER — PHENYLEPHRINE 40 MCG/ML (10ML) SYRINGE FOR IV PUSH (FOR BLOOD PRESSURE SUPPORT)
PREFILLED_SYRINGE | INTRAVENOUS | Status: AC
  Filled 2020-11-20: qty 10

## 2020-11-20 MED ORDER — FLUTICASONE FUROATE-VILANTEROL 200-25 MCG/INH IN AEPB
1.0000 | INHALATION_SPRAY | Freq: Every day | RESPIRATORY_TRACT | Status: DC
  Administered 2020-11-21 – 2020-12-18 (×27): 1 via RESPIRATORY_TRACT
  Filled 2020-11-20 (×3): qty 28

## 2020-11-20 MED ORDER — FEBUXOSTAT 40 MG PO TABS
40.0000 mg | ORAL_TABLET | Freq: Every morning | ORAL | Status: DC
  Administered 2020-11-22 – 2020-12-18 (×26): 40 mg via ORAL
  Filled 2020-11-20 (×31): qty 1

## 2020-11-20 MED ORDER — GABAPENTIN 100 MG PO CAPS
100.0000 mg | ORAL_CAPSULE | Freq: Three times a day (TID) | ORAL | Status: DC
  Administered 2020-11-20 – 2020-12-14 (×72): 100 mg via ORAL
  Filled 2020-11-20 (×72): qty 1

## 2020-11-20 MED ORDER — LEVOTHYROXINE SODIUM 75 MCG PO TABS
75.0000 ug | ORAL_TABLET | Freq: Every morning | ORAL | Status: DC
  Administered 2020-11-20 – 2020-11-22 (×2): 75 ug via ORAL
  Filled 2020-11-20 (×2): qty 1

## 2020-11-20 MED ORDER — ACETAMINOPHEN 500 MG PO TABS: 500.0000 mg | ORAL_TABLET | Freq: Four times a day (QID) | ORAL | Status: DC | PRN

## 2020-11-20 MED ORDER — METOPROLOL TARTRATE 5 MG/5ML IV SOLN: 2.5000 mg | Freq: Four times a day (QID) | INTRAVENOUS | Status: DC | PRN

## 2020-11-20 MED ORDER — ISOSORBIDE MONONITRATE ER 60 MG PO TB24: 60.0000 mg | ORAL_TABLET | Freq: Every day | ORAL | Status: DC

## 2020-11-20 MED ORDER — AMIODARONE HCL IN DEXTROSE 360-4.14 MG/200ML-% IV SOLN
30.0000 mg/h | INTRAVENOUS | Status: DC
  Filled 2020-11-20: qty 200

## 2020-11-20 MED ORDER — METOPROLOL TARTRATE 25 MG PO TABS: 25.0000 mg | ORAL_TABLET | Freq: Two times a day (BID) | ORAL | Status: DC

## 2020-11-20 MED ORDER — ATORVASTATIN CALCIUM 40 MG PO TABS
40.0000 mg | ORAL_TABLET | Freq: Every day | ORAL | Status: DC
  Administered 2020-11-20: 40 mg via ORAL
  Filled 2020-11-20: qty 1

## 2020-11-20 MED ORDER — MIDAZOLAM HCL 2 MG/2ML IJ SOLN
INTRAMUSCULAR | Status: AC
  Filled 2020-11-20: qty 2

## 2020-11-20 MED ORDER — ESTRADIOL 1 MG PO TABS
1.0000 mg | ORAL_TABLET | Freq: Every day | ORAL | Status: DC
  Administered 2020-11-21 – 2020-12-18 (×28): 1 mg via ORAL
  Filled 2020-11-20 (×29): qty 1

## 2020-11-20 MED ORDER — DILTIAZEM HCL-DEXTROSE 125-5 MG/125ML-% IV SOLN (PREMIX)
5.0000 mg/h | INTRAVENOUS | Status: DC
  Administered 2020-11-20: 5 mg/h via INTRAVENOUS
  Filled 2020-11-20: qty 125

## 2020-11-20 MED ORDER — DILTIAZEM LOAD VIA INFUSION
10.0000 mg | Freq: Once | INTRAVENOUS | Status: DC
  Filled 2020-11-20: qty 10

## 2020-11-20 MED ORDER — POTASSIUM CHLORIDE CRYS ER 10 MEQ PO TBCR
40.0000 meq | EXTENDED_RELEASE_TABLET | Freq: Every day | ORAL | Status: DC
  Administered 2020-11-21 – 2020-11-22 (×3): 40 meq via ORAL
  Filled 2020-11-20 (×4): qty 4

## 2020-11-20 MED ORDER — FERROUS SULFATE 325 (65 FE) MG PO TABS
325.0000 mg | ORAL_TABLET | Freq: Every morning | ORAL | Status: DC
  Administered 2020-11-20 – 2020-12-18 (×29): 325 mg via ORAL
  Filled 2020-11-20 (×28): qty 1

## 2020-11-20 MED ORDER — ISOSORBIDE MONONITRATE ER 30 MG PO TB24
30.0000 mg | ORAL_TABLET | Freq: Every day | ORAL | Status: DC
  Administered 2020-11-20: 30 mg via ORAL
  Filled 2020-11-20: qty 1

## 2020-11-20 MED ORDER — SODIUM BICARBONATE 8.4 % IV SOLN
INTRAVENOUS | Status: AC
  Filled 2020-11-20: qty 50

## 2020-11-20 MED ORDER — CALCITRIOL 0.25 MCG PO CAPS
0.2500 ug | ORAL_CAPSULE | Freq: Every day | ORAL | Status: DC
  Administered 2020-11-20 – 2020-12-18 (×29): 0.25 ug via ORAL
  Filled 2020-11-20 (×29): qty 1

## 2020-11-20 MED ORDER — COLCHICINE 0.6 MG PO TABS: 0.6000 mg | ORAL_TABLET | ORAL | Status: DC | PRN

## 2020-11-20 MED ORDER — LIDOCAINE HCL (PF) 1 % IJ SOLN
INTRAMUSCULAR | Status: AC
  Filled 2020-11-20: qty 30

## 2020-11-20 MED ORDER — FENTANYL CITRATE (PF) 100 MCG/2ML IJ SOLN
INTRAMUSCULAR | Status: AC
  Administered 2020-11-21: 25 ug via INTRAVENOUS
  Filled 2020-11-20: qty 2

## 2020-11-20 MED ORDER — AMIODARONE HCL IN DEXTROSE 360-4.14 MG/200ML-% IV SOLN
60.0000 mg/h | INTRAVENOUS | Status: DC
  Administered 2020-11-20 (×2): 60 mg/h via INTRAVENOUS
  Filled 2020-11-20: qty 200

## 2020-11-20 MED ORDER — LIDOCAINE 5 % EX PTCH
1.0000 | MEDICATED_PATCH | CUTANEOUS | Status: DC
  Administered 2020-11-20 – 2020-12-19 (×26): 1 via TRANSDERMAL
  Filled 2020-11-20 (×31): qty 1

## 2020-11-20 MED ORDER — PREDNISONE 10 MG PO TABS
5.0000 mg | ORAL_TABLET | Freq: Every day | ORAL | Status: DC
  Administered 2020-11-21 – 2020-11-22 (×2): 5 mg via ORAL
  Filled 2020-11-20 (×3): qty 1

## 2020-11-20 MED ORDER — SODIUM CHLORIDE 0.9 % IV SOLN: 250.0000 mL | INTRAVENOUS | Status: DC | PRN

## 2020-11-20 MED ORDER — FUROSEMIDE 10 MG/ML IJ SOLN
30.0000 mg/h | INTRAVENOUS | Status: DC
  Administered 2020-11-21 (×3): 30 mg/h via INTRAVENOUS
  Administered 2020-11-21 – 2020-11-22 (×2): 20 mg/h via INTRAVENOUS
  Administered 2020-11-22 – 2020-11-23 (×2): 30 mg/h via INTRAVENOUS
  Administered 2020-11-23: 20 mg/h via INTRAVENOUS
  Administered 2020-11-23 – 2020-11-28 (×15): 30 mg/h via INTRAVENOUS
  Filled 2020-11-20 (×28): qty 20

## 2020-11-20 MED ORDER — AMIODARONE LOAD VIA INFUSION
150.0000 mg | Freq: Once | INTRAVENOUS | Status: AC
  Administered 2020-11-20: 150 mg via INTRAVENOUS
  Filled 2020-11-20: qty 83.34

## 2020-11-20 MED ORDER — APIXABAN 5 MG PO TABS
5.0000 mg | ORAL_TABLET | Freq: Two times a day (BID) | ORAL | Status: DC
  Administered 2020-11-20 (×2): 5 mg via ORAL
  Filled 2020-11-20 (×2): qty 1

## 2020-11-20 MED ORDER — FLUTICASONE PROPIONATE 50 MCG/ACT NA SUSP
2.0000 | Freq: Every day | NASAL | Status: DC
  Administered 2020-11-20 – 2020-12-18 (×28): 2 via NASAL
  Filled 2020-11-20: qty 16

## 2020-11-20 MED ORDER — CLOTRIMAZOLE 1 % EX CREA
1.0000 "application " | TOPICAL_CREAM | Freq: Two times a day (BID) | CUTANEOUS | Status: DC
  Administered 2020-11-21 – 2020-12-18 (×47): 1 via TOPICAL
  Filled 2020-11-20: qty 15

## 2020-11-20 MED ORDER — HYDROCODONE-ACETAMINOPHEN 5-325 MG PO TABS
1.0000 | ORAL_TABLET | Freq: Four times a day (QID) | ORAL | Status: DC | PRN
  Administered 2020-12-09 – 2020-12-14 (×7): 1 via ORAL
  Filled 2020-11-20 (×7): qty 1

## 2020-11-20 MED ORDER — POTASSIUM CHLORIDE CRYS ER 20 MEQ PO TBCR
40.0000 meq | EXTENDED_RELEASE_TABLET | Freq: Once | ORAL | Status: AC
  Administered 2020-11-20: 40 meq via ORAL
  Filled 2020-11-20: qty 2

## 2020-11-20 MED ORDER — CARVEDILOL 25 MG PO TABS: 25.0000 mg | ORAL_TABLET | Freq: Two times a day (BID) | ORAL | Status: DC

## 2020-11-20 MED ORDER — ACETAMINOPHEN 325 MG PO TABS
650.0000 mg | ORAL_TABLET | ORAL | Status: DC | PRN
  Administered 2020-11-20: 650 mg via ORAL
  Filled 2020-11-20: qty 2

## 2020-11-20 MED ORDER — ONDANSETRON HCL 4 MG/2ML IJ SOLN
4.0000 mg | Freq: Four times a day (QID) | INTRAMUSCULAR | Status: DC | PRN
  Administered 2020-11-20 – 2020-12-15 (×15): 4 mg via INTRAVENOUS
  Filled 2020-11-20 (×16): qty 2

## 2020-11-20 MED ORDER — CHLORHEXIDINE GLUCONATE CLOTH 2 % EX PADS
6.0000 | MEDICATED_PAD | Freq: Every day | CUTANEOUS | Status: DC
  Administered 2020-11-21 – 2020-12-18 (×27): 6 via TOPICAL

## 2020-11-20 MED ORDER — FUROSEMIDE 10 MG/ML IJ SOLN
120.0000 mg | Freq: Once | INTRAVENOUS | Status: AC
  Administered 2020-11-21: 120 mg via INTRAVENOUS
  Filled 2020-11-20 (×2): qty 12

## 2020-11-20 MED ORDER — SODIUM CHLORIDE 0.9% FLUSH
3.0000 mL | Freq: Two times a day (BID) | INTRAVENOUS | Status: DC
  Administered 2020-11-20 – 2020-12-18 (×49): 3 mL via INTRAVENOUS

## 2020-11-20 MED ORDER — CARVEDILOL 12.5 MG PO TABS: 12.5000 mg | ORAL_TABLET | Freq: Two times a day (BID) | ORAL | Status: DC

## 2020-11-20 MED ORDER — SODIUM CHLORIDE 0.9% FLUSH
3.0000 mL | INTRAVENOUS | Status: DC | PRN
  Administered 2020-11-20: 3 mL via INTRAVENOUS

## 2020-11-20 MED ORDER — AZELAIC ACID 15 % EX GEL: 1.0000 "application " | Freq: Two times a day (BID) | CUTANEOUS | Status: DC | PRN

## 2020-11-20 MED ORDER — ALBUTEROL SULFATE (2.5 MG/3ML) 0.083% IN NEBU
2.5000 mg | INHALATION_SOLUTION | Freq: Every day | RESPIRATORY_TRACT | Status: DC | PRN
  Administered 2020-12-14 – 2020-12-19 (×3): 2.5 mg via RESPIRATORY_TRACT
  Filled 2020-11-20 (×4): qty 3

## 2020-11-20 MED ORDER — FUROSEMIDE 10 MG/ML IJ SOLN: 20.0000 mg | Freq: Two times a day (BID) | INTRAMUSCULAR | Status: DC

## 2020-11-20 MED ORDER — PHENYLEPHRINE 40 MCG/ML (10ML) SYRINGE FOR IV PUSH (FOR BLOOD PRESSURE SUPPORT): 200.0000 ug | PREFILLED_SYRINGE | Freq: Once | INTRAVENOUS | Status: DC | PRN

## 2020-11-20 MED ORDER — FUROSEMIDE 10 MG/ML IJ SOLN
120.0000 mg | Freq: Two times a day (BID) | INTRAVENOUS | Status: DC
  Administered 2020-11-20: 120 mg via INTRAVENOUS
  Filled 2020-11-20: qty 12
  Filled 2020-11-20: qty 10

## 2020-11-20 MED ORDER — PREDNISONE 5 MG (21) PO TBPK
5.0000 mg | ORAL_TABLET | ORAL | Status: DC
  Filled 2020-11-20: qty 21

## 2020-11-20 NOTE — ED Notes (Signed)
Portable XR at bedside

## 2020-11-20 NOTE — ED Notes (Signed)
Finished bowl of oatmeal and decaf coffee. Sitting in chair. No signs of distress. No complaints.

## 2020-11-20 NOTE — ED Notes (Signed)
Pt's daughter would like a phone call, when the bed is assigned, prior to her transport, thanks

## 2020-11-20 NOTE — Progress Notes (Addendum)
BP elevated, will restart Coreg at 12.5 mg BID and titrate. Continue to home Amlodipine.  Resume Imdur to full dose in AM.

## 2020-11-20 NOTE — ED Triage Notes (Addendum)
Pt reports shob and fluid overload for the last month. Mild cp for several days. Hx ckd, chf and lupus.

## 2020-11-20 NOTE — Significant Event (Addendum)
Pt states that she is not feeling well, calmy, with stable vitals. Blood sugar 145. Vitals check frequently on the monitor.

## 2020-11-20 NOTE — ED Provider Notes (Signed)
[facility] EMERGENCY DEPARTMENT Provider Note   CSN: [id number] Arrival date & time: 11/20/20  0303     History Chief Complaint  Patient presents with   Shortness of Breath    Caitlyn Fuentes is a 65 y.o. female.  The history is provided by the patient.  Shortness of Breath Severity:  Severe Onset quality:  Gradual Timing:  Constant Progression:  Worsening Chronicity:  Recurrent Relieved by:  Rest Worsened by:  Activity Associated symptoms: chest pain   Associated symptoms: no fever and no vomiting   Patient with history of atrial fibrillation on Xarelto, nonischemic cardiomyopathy, hypertension, lupus presents with shortness of breath.  Patient reports she has had ongoing worsening lower extremity edema for quite some time.  She also reports progressively worsening shortness of breath.  She reports over the past day she has had worsening shortness of breath.  With any activity she feels out of breath.  She also reports orthopnea.  No fevers or vomiting.  She has had cough.  She also reports persistent sharp chest pain on the right side that she has had for several weeks. She reports she just recently became compliant with her Xarelto after medication change.    Past Medical History:  Diagnosis Date   A-fib ([place])    Cardiomyopathy    Hyperlipidemia    Hypertension    Lupus ([place])    Pulmonary hypertension ([place]) 05/10/2011   Echo, EF-40-45   Renal disorder    Sleep apnea 2008   CPAP, pt does not know settings   Thyroid disease     Patient Active Problem List   Diagnosis Date Noted   CKD (chronic kidney disease) stage 4, GFR 15-29 ml/min ([place]) 10/16/2019   AKI (acute kidney injury) ([place]) 75/91/6384   Chronic systolic congestive heart failure ([place]) 10/16/2019   Gout due to renal impairment 10/16/2019   Morbid obesity ([place]) 10/16/2019   Hypokalemia 10/15/2019   CKD (chronic kidney disease) stage 3, GFR 30-59 ml/min ([facility]) 08/28/2016   Dizziness 08/17/2016    Acute hypokalemia 08/17/2016   Acute hyponatremia 08/17/2016   Chest tightness 08/17/2016   Near syncope 08/17/2016   Nonischemic cardiomyopathy ([place]) 05/25/2015   Chronic combined systolic and diastolic heart failure ([place]) 05/25/2015   Palpitations 05/25/2015   Gout 10/16/2013   Hyperlipidemia 10/16/2013   Long term current use of anticoagulant therapy 07/20/2013   Chest pain, atypical 08/16/2011   Morbid obesity ([place]) 08/16/2011   Bilateral lower extremity edema 08/16/2011   Obstructive sleep apnea on CPAP 08/16/2011   Paroxysmal atrial fibrillation ([place]) 08/16/2011    Past Surgical History:  Procedure Laterality Date    c sections  x 2   ABDOMINAL HYSTERECTOMY     complete   CARDIAC CATHETERIZATION     Nonischemic, EF-40, continued medical therapy   CARDIAC CATHETERIZATION  10/02/2006   No significant CAD   COLONOSCOPY WITH PROPOFOL N/A 11/22/2015   Procedure: COLONOSCOPY WITH PROPOFOL;  Surgeon: Juanita Craver, MD;  Location: [facility];  Service: Endoscopy;  Laterality: N/A;   ESOPHAGOGASTRODUODENOSCOPY (EGD) WITH PROPOFOL N/A 11/22/2015   Procedure: ESOPHAGOGASTRODUODENOSCOPY (EGD) WITH PROPOFOL;  Surgeon: Juanita Craver, MD;  Location: [facility];  Service: Endoscopy;  Laterality: N/A;     OB History   No obstetric history on file.     Family History  Problem Relation Age of Onset   Heart disease Mother    Heart disease Father    Lung disease Sister    Heart disease Brother  Hypertension Sister     Social History   Tobacco Use   Smoking status: Never   Smokeless tobacco: Never  Vaping Use   Vaping Use: Never used  Substance Use Topics   Alcohol use: Yes    Comment: once a month   Drug use: Never    Home Medications Prior to Admission medications   Medication Sig Start Date End Date Taking? Authorizing Provider  acetaminophen (TYLENOL) 500 MG tablet Take 500-1,000 mg by mouth every 6 (six) hours as needed for mild pain or headache.    [provider]  Albuterol Sulfate (PROAIR RESPICLICK) 270 (90 Base) MCG/ACT AEPB Inhale 2 puffs into the lungs daily as needed (for shortness of brath or wheezing).    [provider]  aMILoride (MIDAMOR) 5 MG tablet Take 1 tablet (5 mg total) by mouth every morning. 02/08/20   Almyra Deforest, PA  atorvastatin (LIPITOR) 40 MG tablet Take 1 tablet (40 mg total) by mouth daily. 10/03/20   Troy Sine, MD  Azelaic Acid 15 % cream Apply 1 application topically 2 (two) times daily as needed (to affected areas of the face).  05/28/19   [provider]  carvedilol (COREG) 25 MG tablet Take 1 tablet (25 mg total) by mouth 2 (two) times daily. 02/08/20   Almyra Deforest, PA  clotrimazole (LOTRIMIN) 1 % cream Apply 1 application topically 2 (two) times daily. 02/11/19   [provider]  colchicine 0.6 MG tablet Take 0.6 mg by mouth daily.    [provider]  cyclobenzaprine (FLEXERIL) 10 MG tablet Take 10 mg by mouth 3 (three) times daily as needed for muscle spasms.  09/20/19   [provider]  febuxostat (ULORIC) 40 MG tablet Take 40 mg by mouth in the morning. 10/15/19   [provider]  ferrous sulfate 325 (65 FE) MG tablet Take 325 mg by mouth every morning. 12/02/19   [provider]  fluticasone (FLONASE) 50 MCG/ACT nasal spray Place 2 sprays into both nostrils daily. 01/19/20   [provider]  gabapentin (NEURONTIN) 100 MG capsule Take 100 mg by mouth.  12/04/16   [provider]  hydrALAZINE (APRESOLINE) 10 MG tablet Take 1 tablet (10 mg total) by mouth every 8 (eight) hours. 02/08/20   Almyra Deforest, PA  HYDROcodone-acetaminophen (NORCO/VICODIN) 5-325 MG tablet Take 1 tablet by mouth every 6 (six) hours as needed for moderate pain.    [provider]  isosorbide mononitrate (IMDUR) 60 MG 24 hr tablet TAKE 1 TABLET BY MOUTH EVERY DAY 07/15/20   Troy Sine, MD  levothyroxine (SYNTHROID) 75 MCG tablet Take 75 mcg by mouth every  morning. 12/02/19   [provider]  predniSONE (STERAPRED UNI-PAK 21 TAB) 5 MG (21) TBPK tablet  09/15/19   [provider]  rivaroxaban (XARELTO) 20 MG TABS tablet Take 1 tablet (20 mg total) by mouth daily with supper. 06/28/20   Troy Sine, MD  torsemide (DEMADEX) 20 MG tablet TAKE 2 TABLETS (40 MG TOTAL) BY MOUTH 2 (TWO) TIMES DAILY. *NEEDS OFFICE VISIT FOR FURTHER REFILLS* 03/29/20   Troy Sine, MD    Allergies    Diovan [valsartan], Lisinopril, Lisinopril, Tape, Diovan [valsartan], Metolazone, and Zaroxolyn [metolazone]  Review of Systems   Review of Systems  Constitutional:  Positive for unexpected weight change. Negative for fever.  Respiratory:  Positive for shortness of breath.   Cardiovascular:  Positive for chest pain and leg swelling.  Gastrointestinal:  Positive  for nausea. Negative for vomiting.  All other systems reviewed and are negative.  Physical Exam Updated Vital Signs BP 96/70 (BP Location: Right Arm)   Pulse (!) 114   Temp 98 F (36.7 C) (Oral)   Resp (!) 30   Ht 1.613 m (5' 3.5")   Wt 126.4 kg   SpO2 97%   BMI 48.58 kg/m   Physical Exam CONSTITUTIONAL: Elderly, ill-appearing HEAD: Normocephalic/atraumatic EYES: EOMI/PERRL ENMT: Mucous membranes moist NECK: supple no meningeal signs SPINE/BACK:entire spine nontender CV: Tachycardic, regular, distant heart sounds noted LUNGS: Lungs are clear to auscultation bilaterally, tachypnea noted ABDOMEN: soft, nontender, obese GU:no cva tenderness NEURO: Pt is awake/alert/appropriate, moves all extremitiesx4.  No facial droop.   EXTREMITIES: pulses normal/equal, full ROM, 2+ pitting edema to bilateral lower extremities SKIN: warm, color normal PSYCH: Mildly anxious ED Results / Procedures / Treatments   Labs (all labs ordered are listed, but only abnormal results are displayed) Labs Reviewed  BASIC METABOLIC PANEL - Abnormal; Notable for the following components:      Result Value    Potassium 3.3 (*)    Chloride 96 (*)    Glucose, Bld 121 (*)    BUN 94 (*)    Creatinine, Ser 3.73 (*)    GFR, Estimated 13 (*)    All other components within normal limits  CBC WITH DIFFERENTIAL/PLATELET - Abnormal; Notable for the following components:   nRBC 0.9 (*)    All other components within normal limits  BRAIN NATRIURETIC PEPTIDE - Abnormal; Notable for the following components:   B Natriuretic Peptide 2,051.3 (*)    All other components within normal limits  TROPONIN I (HIGH SENSITIVITY) - Abnormal; Notable for the following components:   Troponin I (High Sensitivity) 38 (*)    All other components within normal limits  RESP PANEL BY RT-PCR (FLU A&B, COVID) [id number]    EKG EKG Interpretation  Date/Time:  Sunday November 20 2020 03:12:30 EDT Ventricular Rate:  125 PR Interval:    QRS Duration: 81 QT Interval:  318 QTC Calculation: 459 R Axis:   -21 Text Interpretation: Atrial fibrillation Borderline left axis deviation Low voltage, precordial leads Borderline repolarization abnormality Confirmed by Ripley Fraise [phone]) on 11/20/2020 3:19:17 AM  Radiology DG Chest Portable 1 View  Result Date: 11/20/2020 CLINICAL DATA:  Dyspnea EXAM: PORTABLE CHEST 1 VIEW COMPARISON:  10/27/2019 FINDINGS: The examination is slightly limited by underpenetration. Pulmonary insufflation is stable. Retrocardiac opacification likely relates to underpenetration and overlying soft tissue. No definite focal pulmonary infiltrate. No pneumothorax or definite pleural effusion. Mild cardiomegaly is stable. Pulmonary vascularity is normal. IMPRESSION: Limited examination.  Stable cardiomegaly. Electronically Signed   By: Fidela Salisbury MD   On: 11/20/2020 03:56    Procedures .Critical Care  Date/Time: 11/20/2020 3:48 AM Performed by: Ripley Fraise, MD Authorized by: Ripley Fraise, MD   Critical care provider statement:    Critical care time (minutes):  60   Critical care start time:   11/20/2020 3:48 AM   Critical care end time:  11/20/2020 4:48 AM   Critical care time was exclusive of:  Separately billable procedures and treating other patients   Critical care was necessary to treat or prevent imminent or life-threatening deterioration of the following conditions:  Respiratory failure and cardiac failure   Critical care was time spent personally by me on the following activities:  Development of treatment plan with patient or surrogate, evaluation of patient's response to treatment, examination of patient, obtaining history from patient  or surrogate, re-evaluation of patient's condition, pulse oximetry, ordering and review of radiographic studies, ordering and review of laboratory studies, discussions with consultants, ordering and performing treatments and interventions and review of old charts   I assumed direction of critical care for this patient from another provider in my specialty: no     Care discussed with: admitting provider     Medications Ordered in ED Medications  diltiazem (CARDIZEM) 125 mg in dextrose 5% 125 mL (1 mg/mL) infusion (has no administration in time range)    ED Course  I have reviewed the triage vital signs and the nursing notes.  Pertinent labs & imaging results that were available during my care of the patient were reviewed by me and considered in my medical decision making (see chart for details).    MDM Rules/Calculators/A&P                           4:07 AM Patient presents with ongoing shortness of breath and peripheral edema.  Patient has history of atrial fibrillation as well as nonischemic cardiomyopathy. Patient mitts that she just started retaking her Xarelto recently.  She reports med compliance with BP meds She also reports she knows that her renal function has been worsening, she was recently told her GFR is 15; tonight GFR is 13  Looking at the clinical picture, suspect that most of her symptoms are due to atrial fibrillation  with RVR.  Her x-ray was limited, but no obvious severe pulmonary edema.  At this point I feel rate control of her A. fib will improve her symptomatology. 5:18 AM Patient's heart rate is gradually improving on Cardizem drip.  Her blood pressure is also improving.  Her work of breathing is improving. Plan to admit to the hospital for further rate control 5:27 AM Patient continues to improve.  Discussed with Dr. Hal Hope for admission.  Patient does not mention any chest pain at this time, will defer further work-up at this time.  Looks patient for PE at this time.  Patient has no hypoxia Elevated troponin likely due to underlying renal failure Final Clinical Impression(s) / ED Diagnoses Final diagnoses:  Atrial fibrillation with rapid ventricular response (HCC)  Acute renal failure superimposed on stage 4 chronic kidney disease, unspecified acute renal failure type [facility])    Rx / DC Orders ED Discharge Orders     None        Ripley Fraise, MD 11/20/20 [phone]

## 2020-11-20 NOTE — Consult Note (Signed)
Cardiology Consult Note:   Patient ID: Caitlyn Fuentes MRN: [id number]; DOB: [birth year]   Admission date: 11/20/2020  PCP:  Guadlupe Spanish, MD   [facility] Providers Cardiologist:  Shelva Majestic, MD       Chief Complaint:  Hypotension  Patient Profile:   Caitlyn Fuentes is a 65 y.o. 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  History of Present Illness:   Ms. Rafferty notes that she is feeling worsening breathing.  Patient notes that she was breathing ok when she saw Almyra Deforest in 2021.  Notes that she has transferred some of her care to [facility] (nephrology) and notes that her diuretics have been decreased in order to preserved her kidney function.  Since then notes that she has had worsening in heart breathing.  This has escalated to the point she could no longer lay flat.    Patient on evaluation was found to have a GFR of 13 and return of AF.   Patient notes a feeling of a flutter in her chest; this is her prior symptom of AF but notes she is normally in rhythm.  At [facility] (initial eval) was placed in diltiazem drip.  Found to have lower BP SBP 80s, and was given an 250 cc IV bolus with cessation of diltiazem.  Given hypotension, cardiology called for urgent evaluation.  Has had no chest pain, chest pressure, chest tightness, chest stinging.  Patient exertion notable for only doing her ADLs and feels SOB.  Notes swelling in her legs arms, and abdomen at baseline- all of these are worse.  Prior to [place] pandemic went to class for dialysis education but has never need HD since.  Past Medical History:  Diagnosis Date   A-fib ([place])    Cardiomyopathy    Hyperlipidemia    Hypertension    Lupus ([place])    Pulmonary hypertension ([place]) 05/10/2011   Echo, EF-40-45   Renal disorder    Sleep apnea 2008   CPAP, pt does not know settings   Thyroid disease      Past Surgical History:  Procedure Laterality Date    c sections  x 2   ABDOMINAL HYSTERECTOMY     complete   CARDIAC CATHETERIZATION     Nonischemic, EF-40, continued medical therapy   CARDIAC CATHETERIZATION  10/02/2006   No significant CAD   COLONOSCOPY WITH PROPOFOL N/A 11/22/2015   Procedure: COLONOSCOPY WITH PROPOFOL;  Surgeon: Juanita Craver, MD;  Location: [facility];  Service: Endoscopy;  Laterality: N/A;   ESOPHAGOGASTRODUODENOSCOPY (EGD) WITH PROPOFOL N/A 11/22/2015   Procedure: ESOPHAGOGASTRODUODENOSCOPY (EGD) WITH PROPOFOL;  Surgeon: Juanita Craver, MD;  Location: [facility];  Service: Endoscopy;  Laterality: N/A;     Medications Prior to Admission: Prior to Admission medications   Medication Sig Start Date End Date Taking? Authorizing Provider  Albuterol Sulfate (PROAIR RESPICLICK) 673 (90 Base) MCG/ACT AEPB Inhale 2 puffs into the lungs daily as needed (for shortness of brath or wheezing).   Yes [provider]  atorvastatin (LIPITOR) 40 MG tablet Take 1 tablet (40 mg total) by mouth daily. 10/03/20  Yes Troy Sine, MD  Azelaic Acid 15 % cream Apply 1 application topically 2 (two) times daily as needed (to affected areas of the face).  05/28/19  Yes [provider]  carvedilol (COREG) 25 MG tablet Take 1 tablet (25 mg total) by mouth  2 (two) times daily. 02/08/20  Yes Almyra Deforest, PA  clotrimazole (LOTRIMIN) 1 % cream Apply 1 application topically 2 (two) times daily. 02/11/19  Yes [provider]  colchicine 0.6 MG tablet Take 0.6 mg by mouth as needed (flare ups).   Yes [provider]  cyclobenzaprine (FLEXERIL) 10 MG tablet Take 10 mg by mouth 3 (three) times daily as needed for muscle spasms.  09/20/19  Yes [provider]  febuxostat (ULORIC) 40 MG tablet Take 40 mg by mouth in the morning. 10/15/19  Yes [provider]  ferrous sulfate 325 (65 FE) MG tablet Take 325 mg by mouth every morning. 12/02/19  Yes [provider]  fluticasone (FLONASE) 50 MCG/ACT nasal spray Place 2 sprays into both nostrils daily. 01/19/20  Yes [provider]  gabapentin (NEURONTIN) 100 MG capsule Take 100 mg by mouth 3 (three) times daily. 12/04/16  Yes [provider]  HYDROcodone-acetaminophen (NORCO/VICODIN) 5-325 MG tablet Take 1 tablet by mouth every 6 (six) hours as needed for moderate pain.   Yes [provider]  isosorbide mononitrate (IMDUR) 60 MG 24 hr tablet TAKE 1 TABLET BY MOUTH EVERY DAY Patient taking differently: Take 60 mg by mouth daily. 07/15/20  Yes Troy Sine, MD  levothyroxine (SYNTHROID) 75 MCG tablet Take 75 mcg by mouth every morning. 12/02/19  Yes [provider]  predniSONE (STERAPRED UNI-PAK 21 TAB) 5 MG (21) TBPK tablet Take 5 mg by mouth as directed. 09/15/19  Yes [provider]  rivaroxaban (XARELTO) 20 MG TABS tablet Take 1 tablet (20 mg total) by mouth daily with supper. 06/28/20  Yes Troy Sine, MD  torsemide (DEMADEX) 20 MG tablet TAKE 2 TABLETS (40 MG TOTAL) BY MOUTH 2 (TWO) TIMES DAILY. *NEEDS OFFICE VISIT FOR FURTHER REFILLS* Patient taking differently: Take 40 mg by mouth 2 (two) times daily. 03/29/20  Yes Troy Sine, MD  acetaminophen (TYLENOL) 500 MG tablet Take 500-1,000 mg by mouth every 6 (six) hours as needed for mild pain or headache.    [provider]  ADVAIR DISKUS 250-50 MCG/ACT AEPB Inhale 1 puff into the lungs 2 (two) times daily. 11/19/20   [provider]  aMILoride (MIDAMOR) 5 MG tablet Take 1 tablet (5 mg total) by mouth every morning. Patient not taking: Reported on 11/20/2020 02/08/20   Almyra Deforest, PA  calcitRIOL (ROCALTROL) 0.25 MCG capsule Take 0.25 mcg by mouth daily. 10/12/20   [provider]  estradiol (ESTRACE) 1 MG tablet Take 1 mg by mouth daily. 08/23/20   [provider]  hydrALAZINE (APRESOLINE) 10 MG tablet Take 1 tablet (10 mg total) by mouth every 8 (eight)  hours. Patient not taking: Reported on 11/20/2020 02/08/20   Almyra Deforest, PA     Allergies:    Allergies  Allergen Reactions   Diovan [Valsartan] Other (See Comments)    Messes up kidneys   Lisinopril Swelling    Facial swelling   Lisinopril Shortness Of Breath, Swelling and Other (See Comments)    Face, tongue, and mouth swell   Tape Rash and Other (See Comments)    PLEASE USE COBAN WRAP!!   Diovan [Valsartan] Other (See Comments)    Affected kidneys   Metolazone     Acute kidney injury in 07/2016   Zaroxolyn [Metolazone] Other (See Comments)    Caused acute kidney injury at a frequent dosage    Social History:   Social History   Socioeconomic History   Marital  status: Married    Spouse name: Not on file   Number of children: Not on file   Years of education: Not on file   Highest education level: Not on file  Occupational History   Not on file  Tobacco Use   Smoking status: Never   Smokeless tobacco: Never  Vaping Use   Vaping Use: Never used  Substance and Sexual Activity   Alcohol use: Yes    Comment: once a month   Drug use: Never   Sexual activity: Not on file  Other Topics Concern   Not on file  Social History Narrative   ** Merged History Encounter **       Social Determinants of Health   Financial Resource Strain: Not on file  Food Insecurity: Not on file  Transportation Needs: Not on file  Physical Activity: Not on file  Stress: Not on file  Social Connections: Not on file  Intimate Partner Violence: Not on file    Family History:   The patient's family history includes Heart disease in her brother, father, and mother; Hypertension in her sister; Lung disease in her sister.    ROS:  Please see the history of present illness.  All other ROS reviewed and negative.     Physical Exam/Data:   Vitals:   11/20/20 0815 11/20/20 1052 11/20/20 1115 11/20/20 1300  BP: (!) 129/102  136/88 (!) 129/94  Pulse: (!) 103 (!) 2 92 87  Resp: 20 20 20  (!) 23   Temp:  97.9 F (36.6 C) 97.9 F (36.6 C)   TempSrc:   Oral   SpO2: 97% 98% 98% 97%  Weight:      Height:       No intake or output data in the 24 hours ending 11/20/20 1406 Last 3 Weights 11/20/2020 11/20/2020 11/20/2020  Weight (lbs) 278 lb 9.6 oz 271 lb 14.4 oz 264 lb  Weight (kg) 126.372 kg 123.333 kg 119.75 kg     Body mass index is 48.58 kg/m.  General:  Morbidly Obese Female in mild distress HEENT: normal Lymph: no adenopathy Neck: unable to assess JVD; thick neck Endocrine:  No thryomegaly Vascular: No carotid bruits; FA pulses 2+ bilaterally without bruits  Cardiac:  IRIR tachycardia; distant heart sounds Lungs:  coarse breath sounds with DOE Abd: soft, nontender, no hepatomegaly  Ext: +2 edema Musculoskeletal:  No deformities, BUE and BLE strength normal and equal Skin: Cool extremeties Neuro:  CNs 2-12 intact, no focal abnormalities noted Psych:  Normal affect   EKG:  The ECG that was done  was personally reviewed and demonstrates AF RVR 125  Relevant CV Studies:  Transthoracic Echocardiogram: Date: 12/25/2018 Results: 1. The left ventricle has mild-moderately reduced systolic function, with  an ejection fraction of 40-45%. The cavity size was mild to moderately  dilated. Left ventricular diastolic Doppler parameters are indeterminate.  No evidence of left ventricular  regional wall motion abnormalities.   2. The mitral valve is abnormal. Mild thickening of the mitral valve  leaflet.   3. The aortic valve was not well visualized. No stenosis of the aortic  valve.   4. The aorta is normal unless otherwise noted.   5. When compared to the prior study: 08/19/2016: LVEF 40-45%.   Laboratory Data:  High Sensitivity Troponin:   Recent Labs  Lab 11/20/20 0335 11/20/20 0600  TROPONINIHS 38* 35*      Chemistry Recent Labs  Lab 11/20/20 0318  NA 137  K 3.3*  CL  96*  CO2 27  GLUCOSE 121*  BUN 94*  CREATININE 3.73*  CALCIUM 9.2  GFRNONAA 13*  ANIONGAP  14    No results for input(s): PROT, ALBUMIN, AST, ALT, ALKPHOS, BILITOT in the last 168 hours. Hematology Recent Labs  Lab 11/20/20 0318  WBC 6.5  RBC 4.51  HGB 14.4  HCT 43.1  MCV 95.6  MCH 31.9  MCHC 33.4  RDW 13.6  PLT 277   BNP Recent Labs  Lab 11/20/20 0318  BNP 2,051.3*    DDimer No results for input(s): DDIMER in the last 168 hours.   Radiology/Studies:  DG Chest Portable 1 View  Result Date: 11/20/2020 CLINICAL DATA:  Dyspnea EXAM: PORTABLE CHEST 1 VIEW COMPARISON:  10/27/2019 FINDINGS: The examination is slightly limited by underpenetration. Pulmonary insufflation is stable. Retrocardiac opacification likely relates to underpenetration and overlying soft tissue. No definite focal pulmonary infiltrate. No pneumothorax or definite pleural effusion. Mild cardiomegaly is stable. Pulmonary vascularity is normal. IMPRESSION: Limited examination.  Stable cardiomegaly. Electronically Signed   By: Fidela Salisbury MD   On: 11/20/2020 03:56     Assessment and Plan:   Hypotension Heart Failure Reduced Ejection Fraction  - NYHA class IV, Stage C-D, hypervolemic, etiology unclear CKD Stage IV-V OSA on CPAP PAF; CHADSVASC of 2 on eliquis now in the setting of her GFR with no presently plan for arterial intervention Lupus Morbid Obesity - Diuretic regimen: Starting 120 IV BID; we have discussed how this may negatively affect her kidneys but she has resting tachypnea needing evaluation. - given hypotension on low dose BB; patient may need amiodarone; we have discussed the risk this could potentially have given unclear DOAC compliance for the past 30 days, but was unable to tolerate BB without symptomatic hypotension; kidney fx would make dig less ideal; suspect RVR is contributing to worsening HF picture -  Replace electrolytes PRN and keep K>4 and Mg>2. - Unclear if exacerbating factor is backing off diuretics vs low flow state:  sending lactate and LFTs; if low flow state may  need low dose milrinone  - holding BB presently - ARNI/ARB/ACEi off in the setting of her allergies and her GFR - aldactone not start for kidney function as in SGLT2i - defere isordil/hydralazine for afterload reduction presently  - TTE ordered  - on optimally titrated GDMT or with non hepatic/geriatic barriers to GDMT (Renal/hypotension issues); could be potential verociguat start if kidney recovers  CRITICAL CARE Performed by: Mirely Pangle A Cornelis Kluver  Total critical care time: 70 minutes. Critical care time was exclusive of separately billable procedures and treating other patients. Critical care was necessary to treat or prevent imminent or life-threatening deterioration. Critical care was time spent personally by me on the following activities: development of treatment plan with patient and/or surrogate as well as nursing, discussions with consultants, evaluation of patient's response to treatment, examination of patient, obtaining history from patient or surrogate, ordering and performing treatments and interventions, ordering and review of laboratory studies, ordering and review of radiographic studies, pulse oximetry and re-evaluation of patient's condition.    Signed, Rudean Haskell, MD [place]  11/20/2020 2:44 PM    Risk Assessment/Risk Scores:      New York Heart Association (NYHA) Functional Class NYHA Class IV    For questions or updates, please contact [facility] Please consult [email or web address] for contact info under     Signed, Werner Lean, MD  11/20/2020 2:06 PM

## 2020-11-20 NOTE — H&P (Addendum)
History and Physical    Caitlyn Fuentes PJK:[id number] DOB: 1956-03-27 DOA: 11/20/2020  PCP: Guadlupe Spanish, MD (Confirm with patient/family/NH records and if not entered, this has to be entered at [facility] point of entry) Patient coming from: Home  I have personally briefly reviewed patient's old medical records in [place]  Chief Complaint: SOB, palpitations.  HPI: Caitlyn Fuentes is a 66 y.o. female with medical history significant of PAF on systemic anticoagulation, chronic systolic CHF secondary to nonischemic cardiomyopathy, CKD stage IV, SLE on chronic steroids, HTN, COPD, pulmonary hypertension, gout, hypothyroidism, presented with worsening of palpitations and shortness of breath and leg swelling.  In last 3 to 4 weeks, patient has had episodes of palpitations and increasing exertional dyspnea.  She went to see her nephrologist 3 weeks ago, found that her blood pressure on the low side and significant worsening of her kidney function.  Nephrologist discontinued her amlodipine and hydralazine at that time.  However, patient continued to experience palpitations and worsening of breathing symptoms as well as increasing leg swelling.  She also developed a right-sided pleuritic chest pain, worsening with deep breath and cough, sharp-like 7-8/10 since yesterday.  ED Course: Patient was found to have a rapid A. fib and chest x-ray showed significant cardiomegaly and lung congestion.  Cardizem drip started in ED, but patient has had episodes of borderline hypotension.  And patient continued to have shortness of breath.  Blood work creatinine 3.7 compared to baseline 2.2, potassium 3.3.  Review of Systems: As per HPI otherwise 14 point review of systems negative.    Past Medical History:  Diagnosis Date   A-fib ([place])    Cardiomyopathy    Hyperlipidemia    Hypertension    Lupus ([place])    Pulmonary hypertension ([place]) 05/10/2011   Echo, EF-40-45   Renal disorder    Sleep apnea  2008   CPAP, pt does not know settings   Thyroid disease     Past Surgical History:  Procedure Laterality Date    c sections  x 2   ABDOMINAL HYSTERECTOMY     complete   CARDIAC CATHETERIZATION     Nonischemic, EF-40, continued medical therapy   CARDIAC CATHETERIZATION  10/02/2006   No significant CAD   COLONOSCOPY WITH PROPOFOL N/A 11/22/2015   Procedure: COLONOSCOPY WITH PROPOFOL;  Surgeon: Juanita Craver, MD;  Location: [facility];  Service: Endoscopy;  Laterality: N/A;   ESOPHAGOGASTRODUODENOSCOPY (EGD) WITH PROPOFOL N/A 11/22/2015   Procedure: ESOPHAGOGASTRODUODENOSCOPY (EGD) WITH PROPOFOL;  Surgeon: Juanita Craver, MD;  Location: [facility];  Service: Endoscopy;  Laterality: N/A;     reports that she has never smoked. She has never used smokeless tobacco. She reports current alcohol use. She reports that she does not use drugs.  Allergies  Allergen Reactions   Diovan [Valsartan] Other (See Comments)    Messes up kidneys   Lisinopril Swelling    Facial swelling   Lisinopril Shortness Of Breath, Swelling and Other (See Comments)    Face, tongue, and mouth swell   Tape Rash and Other (See Comments)    PLEASE USE COBAN WRAP!!   Diovan [Valsartan] Other (See Comments)    Affected kidneys   Metolazone     Acute kidney injury in 07/2016   Zaroxolyn [Metolazone] Other (See Comments)    Caused acute kidney injury at a frequent dosage    Family History  Problem Relation Age of Onset   Heart disease Mother    Heart disease Father  Lung disease Sister    Heart disease Brother    Hypertension Sister      Prior to Admission medications   Medication Sig Start Date End Date Taking? Authorizing Provider  Albuterol Sulfate (PROAIR RESPICLICK) 937 (90 Base) MCG/ACT AEPB Inhale 2 puffs into the lungs daily as needed (for shortness of brath or wheezing).   Yes [provider]  atorvastatin (LIPITOR) 40 MG tablet Take 1 tablet (40 mg total) by mouth daily. 10/03/20  Yes Troy Sine, MD  Azelaic Acid 15 % cream Apply 1 application topically 2 (two) times daily as needed (to affected areas of the face).  05/28/19  Yes [provider]  carvedilol (COREG) 25 MG tablet Take 1 tablet (25 mg total) by mouth 2 (two) times daily. 02/08/20  Yes Almyra Deforest, PA  clotrimazole (LOTRIMIN) 1 % cream Apply 1 application topically 2 (two) times daily. 02/11/19  Yes [provider]  colchicine 0.6 MG tablet Take 0.6 mg by mouth as needed (flare ups).   Yes [provider]  cyclobenzaprine (FLEXERIL) 10 MG tablet Take 10 mg by mouth 3 (three) times daily as needed for muscle spasms.  09/20/19  Yes [provider]  febuxostat (ULORIC) 40 MG tablet Take 40 mg by mouth in the morning. 10/15/19  Yes [provider]  ferrous sulfate 325 (65 FE) MG tablet Take 325 mg by mouth every morning. 12/02/19  Yes [provider]  fluticasone (FLONASE) 50 MCG/ACT nasal spray Place 2 sprays into both nostrils daily. 01/19/20  Yes [provider]  gabapentin (NEURONTIN) 100 MG capsule Take 100 mg by mouth 3 (three) times daily. 12/04/16  Yes [provider]  HYDROcodone-acetaminophen (NORCO/VICODIN) 5-325 MG tablet Take 1 tablet by mouth every 6 (six) hours as needed for moderate pain.   Yes [provider]  isosorbide mononitrate (IMDUR) 60 MG 24 hr tablet TAKE 1 TABLET BY MOUTH EVERY DAY Patient taking differently: Take 60 mg by mouth daily. 07/15/20  Yes Troy Sine, MD  levothyroxine (SYNTHROID) 75 MCG tablet Take 75 mcg by mouth every morning. 12/02/19  Yes [provider]  predniSONE (STERAPRED UNI-PAK 21 TAB) 5 MG (21) TBPK tablet Take 5 mg by mouth as directed. 09/15/19  Yes [provider]  rivaroxaban (XARELTO) 20 MG TABS tablet Take 1 tablet (20 mg total) by mouth daily with supper. 06/28/20  Yes Troy Sine, MD  torsemide (DEMADEX) 20 MG tablet TAKE 2 TABLETS (40 MG TOTAL) BY MOUTH 2 (TWO) TIMES DAILY.  *NEEDS OFFICE VISIT FOR FURTHER REFILLS* Patient taking differently: Take 40 mg by mouth 2 (two) times daily. 03/29/20  Yes Troy Sine, MD  acetaminophen (TYLENOL) 500 MG tablet Take 500-1,000 mg by mouth every 6 (six) hours as needed for mild pain or headache.    [provider]  ADVAIR DISKUS 250-50 MCG/ACT AEPB Inhale 1 puff into the lungs 2 (two) times daily. 11/19/20   [provider]  aMILoride (MIDAMOR) 5 MG tablet Take 1 tablet (5 mg total) by mouth every morning. Patient not taking: Reported on 11/20/2020 02/08/20   Almyra Deforest, PA  calcitRIOL (ROCALTROL) 0.25 MCG capsule Take 0.25 mcg by mouth daily. 10/12/20   [provider]  estradiol (ESTRACE) 1 MG tablet Take 1 mg by mouth daily. 08/23/20   [provider]  hydrALAZINE (APRESOLINE) 10 MG tablet Take 1 tablet (10 mg total) by mouth every 8 (eight) hours. Patient not taking: Reported on 11/20/2020 02/08/20  Almyra Deforest, [place]    Physical Exam: Vitals:   11/20/20 0815 11/20/20 1052 11/20/20 1115 11/20/20 1300  BP: (!) 129/102  136/88 (!) 129/94  Pulse: (!) 103 (!) 2 92 87  Resp: 20 20 20  (!) 23  Temp:  97.9 F (36.6 C) 97.9 F (36.6 C)   TempSrc:   Oral   SpO2: 97% 98% 98% 97%  Weight:      Height:        Constitutional: NAD, calm, comfortable Vitals:   11/20/20 0815 11/20/20 1052 11/20/20 1115 11/20/20 1300  BP: (!) 129/102  136/88 (!) 129/94  Pulse: (!) 103 (!) 2 92 87  Resp: 20 20 20  (!) 23  Temp:  97.9 F (36.6 C) 97.9 F (36.6 C)   TempSrc:   Oral   SpO2: 97% 98% 98% 97%  Weight:      Height:       Eyes: PERRL, lids and conjunctivae normal ENMT: Mucous membranes are moist. Posterior pharynx clear of any exudate or lesions.Normal dentition.  Neck: normal, supple, no masses, no thyromegaly Respiratory: clear to auscultation bilaterally, no wheezing, fine crackles on B/L bases. Increasing respiratory effort. No accessory muscle use.  Cardiovascular: Irregular heart rate, no  murmurs / rubs / gallops. 2+ extremity edema. 2+ pedal pulses. No carotid bruits.  Abdomen: no tenderness, no masses palpated. No hepatosplenomegaly. Bowel sounds positive.  Musculoskeletal: no clubbing / cyanosis. No joint deformity upper and lower extremities. Good ROM, no contractures. Normal muscle tone.  Skin: no rashes, lesions, ulcers. No induration Neurologic: CN 2-12 grossly intact. Sensation intact, DTR normal. Strength 5/5 in all 4.  Psychiatric: Normal judgment and insight. Alert and oriented x 3. Normal mood.     Labs on Admission: I have personally reviewed following labs and imaging studies  CBC: Recent Labs  Lab 11/20/20 0318  WBC 6.5  NEUTROABS 4.8  HGB 14.4  HCT 43.1  MCV 95.6  PLT 616   Basic Metabolic Panel: Recent Labs  Lab 11/20/20 0318  NA 137  K 3.3*  CL 96*  CO2 27  GLUCOSE 121*  BUN 94*  CREATININE 3.73*  CALCIUM 9.2   GFR: Estimated Creatinine Clearance: 19.9 mL/min (A) (by C-G formula based on SCr of 3.73 mg/dL (H)). Liver Function Tests: No results for input(s): AST, ALT, ALKPHOS, BILITOT, PROT, ALBUMIN in the last 168 hours. No results for input(s): LIPASE, AMYLASE in the last 168 hours. No results for input(s): AMMONIA in the last 168 hours. Coagulation Profile: No results for input(s): INR, PROTIME in the last 168 hours. Cardiac Enzymes: No results for input(s): CKTOTAL, CKMB, CKMBINDEX, TROPONINI in the last 168 hours. BNP (last 3 results) No results for input(s): PROBNP in the last 8760 hours. HbA1C: No results for input(s): HGBA1C in the last 72 hours. CBG: No results for input(s): GLUCAP in the last 168 hours. Lipid Profile: No results for input(s): CHOL, HDL, LDLCALC, TRIG, CHOLHDL, LDLDIRECT in the last 72 hours. Thyroid Function Tests: No results for input(s): TSH, T4TOTAL, FREET4, T3FREE, THYROIDAB in the last 72 hours. Anemia Panel: No results for input(s): VITAMINB12, FOLATE, FERRITIN, TIBC, IRON, RETICCTPCT in the last  72 hours. Urine analysis:    Component Value Date/Time   COLORURINE STRAW (A) 10/15/2019 1952   APPEARANCEUR CLEAR 10/15/2019 1952   LABSPEC 1.008 10/15/2019 1952   PHURINE 6.0 10/15/2019 1952   GLUCOSEU NEGATIVE 10/15/2019 [place] (A) 10/15/2019 [place] NEGATIVE 10/15/2019 [place] NEGATIVE 10/15/2019  [place] NEGATIVE 10/15/2019 1952   UROBILINOGEN 0.2 09/12/2009 2020   NITRITE NEGATIVE 10/15/2019 1952   LEUKOCYTESUR NEGATIVE 10/15/2019 1952    Radiological Exams on Admission: DG Chest Portable 1 View  Result Date: 11/20/2020 CLINICAL DATA:  Dyspnea EXAM: PORTABLE CHEST 1 VIEW COMPARISON:  10/27/2019 FINDINGS: The examination is slightly limited by underpenetration. Pulmonary insufflation is stable. Retrocardiac opacification likely relates to underpenetration and overlying soft tissue. No definite focal pulmonary infiltrate. No pneumothorax or definite pleural effusion. Mild cardiomegaly is stable. Pulmonary vascularity is normal. IMPRESSION: Limited examination.  Stable cardiomegaly. Electronically Signed   By: Fidela Salisbury MD   On: 11/20/2020 03:56    EKG: Independently reviewed.  A. fib with RVR  Assessment/Plan Active Problems:   Atrial fibrillation with RVR (HCC)   CHF (congestive heart failure) ([place])  (please populate well all problems here in Problem List. (For example, if patient is on BP meds at home and you resume or decide to hold them, it is a problem that needs to be her. Same for CAD, COPD, HLD and so on)  A. fib with RVR -Discontinue Cardizem drip due to history of systolic CHF and currently has signs of CHF decompensation.  Discussed with on-call cardiology, agreed with beta-blocker with as needed Lopressor for rate control, change Coreg to metoprolol p.o. given borderline hypotension.  Cardiology further recommended if beta-blocker not effective, recommend using amiodarone. -Change Xarelto to Eliquis given worsening of  kidney function.  Discussed with pharmacy.  Acute on chronic systolic CHF decompensation -Symptoms signs of fluid overload, AKI considered related to cardiorenal syndrome.  Increase diuresis to Lasix 20 mg twice daily IV. -Rate controlled A. fib as above. -Echocardiogram.  Hypertension, now with borderline hypotension -Cut down Imdur doses, change Coreg to metoprolol p.o. 25 twice daily to allow IV Lasix. -As needed hydralazine  AKI on CKD -Fluid overload.  Increase diuresis as above -Overall her GFR level has significant reduced in recent month, from 19 earlier this year increased to ~15 3 weeks ago and today her GFR is 13. -Check uric acid level, renal ultrasound.  Hypokalemia -P.o. replacement, check magnesium level, repeat BMP tomorrow.  SLE -Continue steroid, off disease modification meds due to poor tolerance.  Gout -Hold Colchicine given worsening of kidney function. -Continue Allopurinol.  Hypothyroidism -Continue Synthroid DVT prophylaxis: Eliquis Code Status: Full code Family Communication: None at bedside Disposition Plan: Patient has complicated medical problems with rapid A. fib, decompensated CHF and AKI on CKD, likely will need more than 2 midnight hospital stay. Consults called: Cardiology Admission status: Tele admit   Lequita Halt MD [facility] Pager [phone]  11/20/2020, 1:51 PM

## 2020-11-20 NOTE — Progress Notes (Signed)
   11/20/20 1935  Vitals  Temp (!) 97.2 F (36.2 C)  Temp Source Oral  BP (!) 66/29  MAP (mmHg) (!) 41  Pulse Rate 62  ECG Heart Rate (!) 101  Resp (!) 23  Level of Consciousness  Level of Consciousness Alert  MEWS COLOR  MEWS Score Color Red  Oxygen Therapy  SpO2 99 %  O2 Device Room Air  MEWS Score  MEWS Temp 0  MEWS Systolic 3  MEWS Pulse 1  MEWS RR 1  MEWS LOC 0  MEWS Score 5  Pt c/ SOB, 7/10 chest pressure, pt is diaphoretic but is alert and oriented x 4. Pt is hypotensive and is on amiodarone drip. Dr. Renella Cunas, cardiology on call and rapid response nurse Loma Sousa notified, both came to assessed the patient. Amiodarone drip stopped  per Dr. Renella Cunas. Pt transferred to [place] at 2040, report given to [facility].

## 2020-11-21 ENCOUNTER — Inpatient Hospital Stay (HOSPITAL_COMMUNITY): Payer: Medicare Other

## 2020-11-21 DIAGNOSIS — I5043 Acute on chronic combined systolic (congestive) and diastolic (congestive) heart failure: Secondary | ICD-10-CM | POA: Diagnosis not present

## 2020-11-21 DIAGNOSIS — I4891 Unspecified atrial fibrillation: Secondary | ICD-10-CM

## 2020-11-21 DIAGNOSIS — N184 Chronic kidney disease, stage 4 (severe): Secondary | ICD-10-CM | POA: Diagnosis not present

## 2020-11-21 DIAGNOSIS — R570 Cardiogenic shock: Secondary | ICD-10-CM | POA: Diagnosis not present

## 2020-11-21 DIAGNOSIS — I5042 Chronic combined systolic (congestive) and diastolic (congestive) heart failure: Secondary | ICD-10-CM | POA: Diagnosis not present

## 2020-11-21 LAB — CBC
HCT: 41.9 % (ref 36.0–46.0)
Hemoglobin: 14.2 g/dL (ref 12.0–15.0)
MCH: 32.5 pg (ref 26.0–34.0)
MCHC: 33.9 g/dL (ref 30.0–36.0)
MCV: 95.9 fL (ref 80.0–100.0)
Platelets: 261 10*3/uL (ref 150–400)
RBC: 4.37 MIL/uL (ref 3.87–5.11)
RDW: 13.7 % (ref 11.5–15.5)
WBC: 6.1 10*3/uL (ref 4.0–10.5)
nRBC: 1 % — ABNORMAL HIGH (ref 0.0–0.2)

## 2020-11-21 LAB — URINALYSIS, ROUTINE W REFLEX MICROSCOPIC
Bilirubin Urine: NEGATIVE
Glucose, UA: NEGATIVE mg/dL
Hgb urine dipstick: NEGATIVE
Ketones, ur: NEGATIVE mg/dL
Leukocytes,Ua: NEGATIVE
Nitrite: NEGATIVE
Protein, ur: NEGATIVE mg/dL
Specific Gravity, Urine: 1.008 (ref 1.005–1.030)
pH: 5 (ref 5.0–8.0)

## 2020-11-21 LAB — BASIC METABOLIC PANEL
Anion gap: 15 (ref 5–15)
Anion gap: 14 (ref 5–15)
Anion gap: 13 (ref 5–15)
BUN: 99 mg/dL — ABNORMAL HIGH (ref 8–23)
BUN: 99 mg/dL — ABNORMAL HIGH (ref 8–23)
BUN: 98 mg/dL — ABNORMAL HIGH (ref 8–23)
CO2: 20 mmol/L — ABNORMAL LOW (ref 22–32)
CO2: 25 mmol/L (ref 22–32)
CO2: 24 mmol/L (ref 22–32)
Calcium: 9.3 mg/dL (ref 8.9–10.3)
Calcium: 9.2 mg/dL (ref 8.9–10.3)
Calcium: 9.3 mg/dL (ref 8.9–10.3)
Chloride: 97 mmol/L — ABNORMAL LOW (ref 98–111)
Chloride: 97 mmol/L — ABNORMAL LOW (ref 98–111)
Chloride: 95 mmol/L — ABNORMAL LOW (ref 98–111)
Creatinine, Ser: 3.95 mg/dL — ABNORMAL HIGH (ref 0.44–1.00)
Creatinine, Ser: 4.21 mg/dL — ABNORMAL HIGH (ref 0.44–1.00)
Creatinine, Ser: 3.92 mg/dL — ABNORMAL HIGH (ref 0.44–1.00)
GFR, Estimated: 12 mL/min — ABNORMAL LOW (ref >60)
GFR, Estimated: 11 mL/min — ABNORMAL LOW (ref >60)
GFR, Estimated: 12 mL/min — ABNORMAL LOW (ref >60)
Glucose, Bld: 128 mg/dL — ABNORMAL HIGH (ref 70–99)
Glucose, Bld: 161 mg/dL — ABNORMAL HIGH (ref 70–99)
Glucose, Bld: 177 mg/dL — ABNORMAL HIGH (ref 70–99)
Potassium: 4.6 mmol/L (ref 3.5–5.1)
Potassium: 4.4 mmol/L (ref 3.5–5.1)
Potassium: 3.4 mmol/L — ABNORMAL LOW (ref 3.5–5.1)
Sodium: 132 mmol/L — ABNORMAL LOW (ref 135–145)
Sodium: 136 mmol/L (ref 135–145)
Sodium: 132 mmol/L — ABNORMAL LOW (ref 135–145)

## 2020-11-21 LAB — T4, FREE: Free T4: 2.03 ng/dL — ABNORMAL HIGH (ref 0.61–1.12)

## 2020-11-21 LAB — HIV ANTIBODY (ROUTINE TESTING W REFLEX): HIV Screen 4th Generation wRfx: NONREACTIVE

## 2020-11-21 LAB — ECHOCARDIOGRAM COMPLETE
Area-P 1/2: 6.02 cm2
Calc EF: 30.5 %
Height: 63.5 in
MV M vel: 4.33 m/s
MV Peak grad: 75 mmHg
Radius: 0.5 cm
S' Lateral: 3.3 cm
Single Plane A2C EF: 33.2 %
Single Plane A4C EF: 29.9 %
Weight: 4457.6 oz

## 2020-11-21 LAB — COOXEMETRY PANEL
Carboxyhemoglobin: 0.7 % (ref 0.5–1.5)
Carboxyhemoglobin: 0.9 % (ref 0.5–1.5)
Carboxyhemoglobin: 1.1 % (ref 0.5–1.5)
Methemoglobin: 0.9 % (ref 0.0–1.5)
Methemoglobin: 0.9 % (ref 0.0–1.5)
Methemoglobin: 0.8 % (ref 0.0–1.5)
O2 Saturation: 42.7 %
O2 Saturation: 54.5 %
O2 Saturation: 71.6 %
Total hemoglobin: 13.9 g/dL (ref 12.0–16.0)
Total hemoglobin: 13.8 g/dL (ref 12.0–16.0)
Total hemoglobin: 13.6 g/dL (ref 12.0–16.0)

## 2020-11-21 LAB — PROTIME-INR
INR: 2.7 — ABNORMAL HIGH (ref 0.8–1.2)
Prothrombin Time: 28.4 seconds — ABNORMAL HIGH (ref 11.4–15.2)

## 2020-11-21 LAB — PROTEIN / CREATININE RATIO, URINE
Creatinine, Urine: 30.78 mg/dL
Total Protein, Urine: <3 mg/dL

## 2020-11-21 LAB — MRSA NEXT GEN BY PCR, NASAL: MRSA by PCR Next Gen: NOT DETECTED

## 2020-11-21 LAB — LACTIC ACID, PLASMA
Lactic Acid, Venous: 1.5 mmol/L (ref 0.5–1.9)
Lactic Acid, Venous: 1.2 mmol/L (ref 0.5–1.9)

## 2020-11-21 LAB — BRAIN NATRIURETIC PEPTIDE: B Natriuretic Peptide: 2022.4 pg/mL — ABNORMAL HIGH (ref 0.0–100.0)

## 2020-11-21 LAB — C-REACTIVE PROTEIN: CRP: 1.4 mg/dL — ABNORMAL HIGH

## 2020-11-21 LAB — APTT
aPTT: 122 seconds — ABNORMAL HIGH (ref 24–36)
aPTT: 129 seconds — ABNORMAL HIGH (ref 24–36)

## 2020-11-21 LAB — MAGNESIUM: Magnesium: 2.1 mg/dL (ref 1.7–2.4)

## 2020-11-21 MED ORDER — MILRINONE LACTATE IN DEXTROSE 20-5 MG/100ML-% IV SOLN
0.2500 ug/kg/min | INTRAVENOUS | Status: DC
  Administered 2020-11-21 – 2020-11-29 (×24): 0.375 ug/kg/min via INTRAVENOUS
  Administered 2020-11-29 – 2020-11-30 (×2): 0.25 ug/kg/min via INTRAVENOUS
  Filled 2020-11-21 (×29): qty 100

## 2020-11-21 MED ORDER — HEPARIN (PORCINE) 25000 UT/250ML-% IV SOLN
1450.0000 [IU]/h | INTRAVENOUS | Status: DC
  Administered 2020-11-21: 1300 [IU]/h via INTRAVENOUS
  Administered 2020-11-22: 1000 [IU]/h via INTRAVENOUS
  Administered 2020-11-23: 1050 [IU]/h via INTRAVENOUS
  Administered 2020-11-24: 1300 [IU]/h via INTRAVENOUS
  Administered 2020-11-25: 1500 [IU]/h via INTRAVENOUS
  Administered 2020-11-25: 1400 [IU]/h via INTRAVENOUS
  Administered 2020-11-26 – 2020-11-30 (×7): 1500 [IU]/h via INTRAVENOUS
  Administered 2020-11-30: 1400 [IU]/h via INTRAVENOUS
  Administered 2020-12-01: 1500 [IU]/h via INTRAVENOUS
  Administered 2020-12-02: 1400 [IU]/h via INTRAVENOUS
  Administered 2020-12-03 – 2020-12-05 (×4): 1450 [IU]/h via INTRAVENOUS
  Filled 2020-11-21 (×21): qty 250

## 2020-11-21 MED ORDER — FLUMAZENIL 0.5 MG/5ML IV SOLN
INTRAVENOUS | Status: AC
  Filled 2020-11-21: qty 5

## 2020-11-21 MED ORDER — FENTANYL CITRATE (PF) 100 MCG/2ML IJ SOLN
INTRAMUSCULAR | Status: AC
  Administered 2020-11-21: 100 ug via INTRAVENOUS
  Filled 2020-11-21: qty 2

## 2020-11-21 MED ORDER — ETOMIDATE 2 MG/ML IV SOLN
INTRAVENOUS | Status: AC
  Filled 2020-11-21: qty 10

## 2020-11-21 MED ORDER — PHENYLEPHRINE HCL-NACL 10-0.9 MG/250ML-% IV SOLN
INTRAVENOUS | Status: AC
  Administered 2020-11-21: 10 mg
  Filled 2020-11-21: qty 250

## 2020-11-21 MED ORDER — SODIUM CHLORIDE 0.9% FLUSH: 10.0000 mL | INTRAVENOUS | Status: DC | PRN

## 2020-11-21 MED ORDER — OXYCODONE HCL 5 MG PO TABS
5.0000 mg | ORAL_TABLET | Freq: Once | ORAL | Status: AC
  Administered 2020-11-21: 5 mg via ORAL
  Filled 2020-11-21: qty 1

## 2020-11-21 MED ORDER — MIDAZOLAM HCL 2 MG/2ML IJ SOLN: 2.0000 mg | Freq: Once | INTRAMUSCULAR | Status: AC

## 2020-11-21 MED ORDER — MIDAZOLAM HCL 2 MG/2ML IJ SOLN
INTRAMUSCULAR | Status: AC
  Administered 2020-11-21: 2 mg via INTRAVENOUS
  Filled 2020-11-21: qty 2

## 2020-11-21 MED ORDER — ONDANSETRON HCL 4 MG/2ML IJ SOLN
4.0000 mg | Freq: Once | INTRAMUSCULAR | Status: AC
  Administered 2020-11-21: 4 mg via INTRAVENOUS

## 2020-11-21 MED ORDER — AMIODARONE HCL IN DEXTROSE 360-4.14 MG/200ML-% IV SOLN
60.0000 mg/h | INTRAVENOUS | Status: DC
  Administered 2020-11-21: 60 mg/h via INTRAVENOUS

## 2020-11-21 MED ORDER — PHENYLEPHRINE 40 MCG/ML (10ML) SYRINGE FOR IV PUSH (FOR BLOOD PRESSURE SUPPORT)
PREFILLED_SYRINGE | INTRAVENOUS | Status: AC
  Administered 2020-11-21: 600 ug via INTRAVENOUS
  Filled 2020-11-21: qty 10

## 2020-11-21 MED ORDER — OXYCODONE HCL 5 MG PO TABS
5.0000 mg | ORAL_TABLET | Freq: Four times a day (QID) | ORAL | Status: DC | PRN
  Administered 2020-11-22 – 2020-11-27 (×5): 5 mg via ORAL
  Filled 2020-11-21 (×5): qty 1

## 2020-11-21 MED ORDER — METOLAZONE 5 MG PO TABS
10.0000 mg | ORAL_TABLET | Freq: Once | ORAL | Status: AC
  Administered 2020-11-21: 10 mg via ORAL
  Filled 2020-11-21: qty 2

## 2020-11-21 MED ORDER — NALOXONE HCL 0.4 MG/ML IJ SOLN
INTRAMUSCULAR | Status: AC
  Administered 2020-11-21: 0.4 mg
  Filled 2020-11-21: qty 1

## 2020-11-21 MED ORDER — FENTANYL CITRATE (PF) 100 MCG/2ML IJ SOLN: 100.0000 ug | Freq: Once | INTRAMUSCULAR | Status: AC

## 2020-11-21 MED ORDER — SODIUM CHLORIDE 0.9% FLUSH
10.0000 mL | Freq: Two times a day (BID) | INTRAVENOUS | Status: DC
  Administered 2020-11-21: 10 mL
  Administered 2020-11-22: 20 mL
  Administered 2020-11-22 – 2020-11-23 (×2): 10 mL
  Administered 2020-11-23: 20 mL
  Administered 2020-11-24 (×2): 10 mL
  Administered 2020-11-25: 20 mL
  Administered 2020-11-25 – 2020-11-28 (×5): 10 mL
  Administered 2020-11-28: 30 mL
  Administered 2020-11-29 – 2020-11-30 (×3): 10 mL
  Administered 2020-12-01: 30 mL
  Administered 2020-12-01 – 2020-12-18 (×26): 10 mL

## 2020-11-21 MED ORDER — AMIODARONE LOAD VIA INFUSION
150.0000 mg | Freq: Once | INTRAVENOUS | Status: AC
  Administered 2020-11-21: 150 mg via INTRAVENOUS
  Filled 2020-11-21: qty 83.34

## 2020-11-21 MED ORDER — MILRINONE LACTATE IN DEXTROSE 20-5 MG/100ML-% IV SOLN
0.2500 ug/kg/min | INTRAVENOUS | Status: DC
  Administered 2020-11-21: 0.25 ug/kg/min via INTRAVENOUS
  Filled 2020-11-21: qty 100

## 2020-11-21 MED ORDER — LIDOCAINE HCL (PF) 1 % IJ SOLN
INTRAMUSCULAR | Status: AC
  Filled 2020-11-21: qty 5

## 2020-11-21 MED ORDER — PHENYLEPHRINE 40 MCG/ML (10ML) SYRINGE FOR IV PUSH (FOR BLOOD PRESSURE SUPPORT)
PREFILLED_SYRINGE | INTRAVENOUS | Status: AC
  Filled 2020-11-21: qty 20

## 2020-11-21 MED ORDER — AMIODARONE HCL IN DEXTROSE 360-4.14 MG/200ML-% IV SOLN
30.0000 mg/h | INTRAVENOUS | Status: DC
  Administered 2020-11-21: 30 mg/h via INTRAVENOUS
  Filled 2020-11-21 (×2): qty 200

## 2020-11-21 MED ORDER — AMIODARONE HCL IN DEXTROSE 360-4.14 MG/200ML-% IV SOLN
30.0000 mg/h | INTRAVENOUS | Status: DC
  Administered 2020-11-21 – 2020-12-01 (×39): 60 mg/h via INTRAVENOUS
  Administered 2020-12-01 – 2020-12-05 (×9): 30 mg/h via INTRAVENOUS
  Administered 2020-12-05 – 2020-12-06 (×2): 60 mg/h via INTRAVENOUS
  Administered 2020-12-06 – 2020-12-08 (×5): 30 mg/h via INTRAVENOUS
  Administered 2020-12-08 – 2020-12-09 (×6): 60 mg/h via INTRAVENOUS
  Administered 2020-12-10: 30 mg/h via INTRAVENOUS
  Administered 2020-12-10: 60 mg/h via INTRAVENOUS
  Administered 2020-12-10 – 2020-12-18 (×17): 30 mg/h via INTRAVENOUS
  Filled 2020-11-21 (×82): qty 200

## 2020-11-21 MED ORDER — NOREPINEPHRINE 4 MG/250ML-% IV SOLN
0.0000 ug/min | INTRAVENOUS | Status: DC
Start: 2020-11-21 — End: 2020-11-24
  Administered 2020-11-21: 2 ug/min via INTRAVENOUS
  Administered 2020-11-21: 5 ug/min via INTRAVENOUS
  Filled 2020-11-21 (×3): qty 250

## 2020-11-21 MED ORDER — FENTANYL CITRATE (PF) 100 MCG/2ML IJ SOLN: 25.0000 ug | Freq: Once | INTRAMUSCULAR | Status: AC

## 2020-11-21 NOTE — Progress Notes (Signed)
RT present for cardioversion.  Pt was assisted with respirations with BMV on 15LPM for approximately 15 mins before pt woke up.  Pt able to speak and move all extremities before RT exited.

## 2020-11-21 NOTE — Progress Notes (Signed)
Rapid Response Event Note   Reason for Call :  Chest pain and decreased BP  Initial Focused Assessment:  BP 66/29 pt diaphoretic c/o chest pain and SOB. BP cuff also reading 139/110. HR 121 AFIB SpO2 99% RA. Cards at bedside.    Interventions:  EKG- AFIB Labs  2L [place] comfort    Plan of Care:  Transfer 2h for [place] and potential cardioversion   Event Summary:   MD Notified: Trenda Moots MD    Devota Pace, RN

## 2020-11-21 NOTE — Consult Note (Signed)
Nephrology Consult   Requesting provider: Glori Bickers Service requesting consult: Heart Failure Reason for consult: AKI on CKD, diuretic refractory volume overload   Assessment/Recommendations: Caitlyn Fuentes is a/an 65 y.o. female with a past medical history atrial fibrillation, HFrEF 2/2 NICM, CKD IV, SLE, HTN, COPD, pHTN, gout, hypothyroidism  who present w/ CHF exacerbation w/ shock   Oliguric AKI, unstable: Likely secondary to cardiorenal syndrome/shock.  No acute indication for dialysis at this time but if she does not respond to inotropes and diuretics today may need to start on CRRT -Management of shock as below -Poor dialysis candidate long-term because of heart failure.  Will need to see how she responds once fluid is removed -Continue to monitor daily Cr, Dose meds for GFR -Monitor Daily I/Os, Daily weight  -Maintain MAP>65 for optimal renal perfusion.  -Avoid nephrotoxic medications including NSAIDs and Vanc/Zosyn combo -Obtain urinalysis and UPC  Heart failure exacerbation/shock: Concern for cardiogenic shock based on very cool extremities and volume overload.  Heart failure managing.  Currently on milrinone, norepinephrine, and Lasix infusion  Hypertension: Holding home medications History of SLE: Unclear details.  Obtain lupus labs.  Unlikely contributing to clinical picture Atrial fibrillation: Required cardioversion today.  On heparin and amiodarone. OSA: On CPAP Hypothyroidism: On Synthroid     Recommendations conveyed to primary service.    [facility] 11/21/2020 10:29 AM   _____________________________________________________________________________________ CC: AKI on CKD  History of Present Illness: Caitlyn Fuentes is a/an 65 y.o. female with a past medical history of atrial fibrillation, HFrEF 2/2 NICM, CKD IV, SLE, HTN, COPD, pHTN, gout, hypothyroidism who presents with shortness of breath.  The patient  presented to the emergency department yesterday.  In the past few weeks she has noted worsening palpitations, dyspnea on exertion, lower extremity edema.  She also noticed worsening lower blood pressure at home with some dizziness and lightheadedness.  She was previously on amlodipine and hydralazine but this was recently stopped.  She continued to have dyspnea on exertion and low blood pressures.  She also was having some nausea and palpitations.  She then developed right-sided pleuritic chest pain that was worse with cough and decided to come to the emergency department.  In the emergency department she was found to have atrial fibrillation as well as pulmonary edema.  In the emergency department she was started on Cardizem for atrial fibrillation and developed worsening hypotension.  She was admitted to the hospital for further management.  She had worsening shock requiring transfer to the ICU with inotropic support.  She has not responded to diuretics very well and her creatinine has been increasing.  Baseline creatinine is likely around 2 but we do not have access to outpatient labs.  Creatinine has increased from 3.7 on arrival to 4.2 today.   Medications:  Current Facility-Administered Medications  Medication Dose Route Frequency Provider Last Rate Last Admin   0.9 %  sodium chloride infusion  250 mL Intravenous PRN Wynetta Fines T, MD       albuterol (PROVENTIL) (2.5 MG/3ML) 0.083% nebulizer solution 2.5 mg  2.5 mg Nebulization Daily PRN Wynetta Fines T, MD       amiodarone (NEXTERONE PREMIX) 360-4.14 MG/200ML-% (1.8 mg/mL) IV infusion  60 mg/hr Intravenous Continuous Bensimhon, Shaune Pascal, MD 33.3 mL/hr at 11/21/20 0917 60 mg/hr at 11/21/20 0917   calcitRIOL (ROCALTROL) capsule 0.25 mcg  0.25 mcg Oral Daily Wynetta Fines T, MD   0.25 mcg at 11/20/20 1505   Chlorhexidine Gluconate Cloth  2 % PADS 6 each  6 each Topical Daily Wynetta Fines T, MD       clotrimazole (LOTRIMIN) 1 % cream 1 application  1  application Topical BID Wynetta Fines T, MD   1 application at 05/01/30 0000   cyclobenzaprine (FLEXERIL) tablet 10 mg  10 mg Oral TID PRN Lequita Halt, MD       estradiol (ESTRACE) tablet 1 mg  1 mg Oral Daily Wynetta Fines T, MD       febuxostat (ULORIC) tablet 40 mg  40 mg Oral q AM Wynetta Fines T, MD       ferrous sulfate tablet 325 mg  325 mg Oral q morning Wynetta Fines T, MD   325 mg at 11/20/20 1845   fluticasone (FLONASE) 50 MCG/ACT nasal spray 2 spray  2 spray Each Nare Daily Wynetta Fines T, MD   2 spray at 11/20/20 1846   fluticasone furoate-vilanterol (BREO ELLIPTA) 200-25 MCG/INH 1 puff  1 puff Inhalation Daily Wynetta Fines T, MD       furosemide (LASIX) 200 mg in dextrose 5 % 100 mL (2 mg/mL) infusion  30 mg/hr Intravenous Continuous Bensimhon, Shaune Pascal, MD 15 mL/hr at 11/21/20 0920 30 mg/hr at 11/21/20 0920   gabapentin (NEURONTIN) capsule 100 mg  100 mg Oral TID Wynetta Fines T, MD   100 mg at 11/20/20 2317   heparin ADULT infusion 100 units/mL (25000 units/226mL)  1,300 Units/hr Intravenous Continuous Dion Body, MD       HYDROcodone-acetaminophen (NORCO/VICODIN) 5-325 MG per tablet 1 tablet  1 tablet Oral Q6H PRN Lequita Halt, MD       levothyroxine (SYNTHROID) tablet 75 mcg  75 mcg Oral q morning Wynetta Fines T, MD   75 mcg at 11/20/20 1509   lidocaine (LIDODERM) 5 % 1 patch  1 patch Transdermal Q24H Wynetta Fines T, MD   1 patch at 11/20/20 1839   metolazone (ZAROXOLYN) tablet 10 mg  10 mg Oral Once Bensimhon, Shaune Pascal, MD       milrinone (PRIMACOR) 20 MG/100 ML (0.2 mg/mL) infusion  0.375 mcg/kg/min Intravenous Continuous Dion Body, MD       norepinephrine (LEVOPHED) 4mg  in 229mL premix infusion  0-40 mcg/min Intravenous Titrated Dion Body, MD 22.5 mL/hr at 11/21/20 0900 6 mcg/min at 11/21/20 0900   ondansetron (ZOFRAN) injection 4 mg  4 mg Intravenous Q6H PRN Wynetta Fines T, MD   4 mg at 11/21/20 3557   oxyCODONE (Oxy IR/ROXICODONE) immediate release tablet 5 mg   5 mg Oral Q6H PRN Dion Body, MD       potassium chloride (KLOR-CON) CR tablet 40 mEq  40 mEq Oral Daily Dion Body, MD   40 mEq at 11/21/20 0058   predniSONE (DELTASONE) tablet 5 mg  5 mg Oral Q breakfast Wynetta Fines T, MD       sodium chloride flush (NS) 0.9 % injection 3 mL  3 mL Intravenous Q12H Wynetta Fines T, MD   3 mL at 11/20/20 2200   sodium chloride flush (NS) 0.9 % injection 3 mL  3 mL Intravenous PRN Wynetta Fines T, MD   3 mL at 11/20/20 2359     ALLERGIES Diovan [valsartan], Lisinopril, Lisinopril, Tape, Diovan [valsartan], Metolazone, and Zaroxolyn [metolazone]  MEDICAL HISTORY Past Medical History:  Diagnosis Date   A-fib ([place])    Cardiomyopathy    Hyperlipidemia    Hypertension    Lupus ([place])  Pulmonary hypertension ([place]) 05/10/2011   Echo, EF-40-45   Renal disorder    Sleep apnea 2008   CPAP, pt does not know settings   Thyroid disease      SOCIAL HISTORY Social History   Socioeconomic History   Marital status: Married    Spouse name: Not on file   Number of children: Not on file   Years of education: Not on file   Highest education level: Not on file  Occupational History   Not on file  Tobacco Use   Smoking status: Never   Smokeless tobacco: Never  Vaping Use   Vaping Use: Never used  Substance and Sexual Activity   Alcohol use: Yes    Comment: once a month   Drug use: Never   Sexual activity: Not on file  Other Topics Concern   Not on file  Social History Narrative   ** Merged History Encounter **       Social Determinants of Health   Financial Resource Strain: Not on file  Food Insecurity: Not on file  Transportation Needs: Not on file  Physical Activity: Not on file  Stress: Not on file  Social Connections: Not on file  Intimate Partner Violence: Not on file     FAMILY HISTORY Family History  Problem Relation Age of Onset   Heart disease Mother    Heart disease Father    Lung disease Sister    Heart disease  Brother    Hypertension Sister       Review of Systems: 12 systems reviewed Otherwise as per HPI, all other systems reviewed and negative  Physical Exam: Vitals:   11/21/20 0800 11/21/20 0915  BP: (!) 81/56 100/82  Pulse: 91 61  Resp: (!) 22 18  Temp: (!) 96.4 F (35.8 C)   SpO2: 100% 100%   Total I/O In: 151.4 [I.V.:151.4] Out: -   Intake/Output Summary (Last 24 hours) at 11/21/2020 1029 Last data filed at 11/21/2020 0900 Gross per 24 hour  Intake 649.29 ml  Output 210 ml  Net 439.29 ml   General: Ill-appearing, lying in bed, no distress normal rate HEENT: anicteric sclera, oropharynx clear without lesions CV: Normal rate, no audible murmur, extensive lower extremity edema Lungs: Crackles present bilaterally, bilateral chest rise, mild increased work of breathing Abd: soft, non-tender, mild distention Skin: no visible lesions or rashes Psych: alert, engaged, appropriate mood and affect Musculoskeletal/Extremities: Very cool extremities, no obvious deformities Neuro: normal speech, no gross focal deficits   Test Results Reviewed Lab Results  Component Value Date   NA 136 11/21/2020   K 4.4 11/21/2020   CL 97 (L) 11/21/2020   CO2 25 11/21/2020   BUN 99 (H) 11/21/2020   CREATININE 4.21 (H) 11/21/2020   CALCIUM 9.2 11/21/2020   ALBUMIN 2.8 (L) 11/20/2020     I have reviewed all relevant outside healthcare records related to the patient's current hospitalization

## 2020-11-21 NOTE — Progress Notes (Signed)
Progress Note  Patient Name: Caitlyn Fuentes Date of Encounter: 11/21/2020  Primary Cardiologist: Shelva Majestic, MD   Subjective   Significant events occurred overnight: New hypotension with evidence of end organ dysfunction. A line and CVL placed (L IJ) Minimal UOB on 120 mg IV lasix Cold and clammy.  Called by overnight fellow @ 5 AM; we planned for emergent cardioversion.  Successful DCCV Started milrinone and sent Venous CoOx (43)  Patient notes that she feels better now that she is back in rhythm SOB has improved.  She is aware that she may end up needing special dialysis here.  Still unable to lay flat.  Inpatient Medications    Scheduled Meds:  calcitRIOL  0.25 mcg Oral Daily   Chlorhexidine Gluconate Cloth  6 each Topical Daily   clotrimazole  1 application Topical BID   estradiol  1 mg Oral Daily   febuxostat  40 mg Oral q AM   ferrous sulfate  325 mg Oral q morning   fluticasone  2 spray Each Nare Daily   fluticasone furoate-vilanterol  1 puff Inhalation Daily   gabapentin  100 mg Oral TID   levothyroxine  75 mcg Oral q morning   lidocaine  1 patch Transdermal Q24H   phenylephrine       potassium chloride  40 mEq Oral Daily   predniSONE  5 mg Oral Q breakfast   sodium bicarbonate       sodium chloride flush  3 mL Intravenous Q12H   Continuous Infusions:  sodium chloride     amiodarone 60 mg/hr (11/21/20 0600)   amiodarone     furosemide (LASIX) 200 mg in dextrose 5% 100 mL (2mg /mL) infusion 20 mg/hr (11/21/20 0600)   heparin     milrinone     norepinephrine (LEVOPHED) Adult infusion 2 mcg/min (11/21/20 0701)   PRN Meds: sodium chloride, albuterol, cyclobenzaprine, HYDROcodone-acetaminophen, ondansetron (ZOFRAN) IV, oxyCODONE, sodium chloride flush   Vital Signs    Vitals:   11/21/20 0300 11/21/20 0400 11/21/20 0500 11/21/20 0600  BP:    (!) 83/64  Pulse: (!) 126 61 (!) 25 91  Resp: 20 (!) 29 (!) 25 (!) 37  Temp:  97.6 F (36.4 C)    TempSrc:   Oral    SpO2: 100% 99% 99% 97%  Weight:      Height:        Intake/Output Summary (Last 24 hours) at 11/21/2020 9147 Last data filed at 11/21/2020 0600 Gross per 24 hour  Intake 441.72 ml  Output 210 ml  Net 231.72 ml   Filed Weights   11/20/20 0308 11/20/20 0325 11/20/20 0355  Weight: 119.7 kg 123.3 kg 126.4 kg    Telemetry    AF-> SR - Personally Reviewed  ECG    11/20/20 AF rate 100 irregularly irregular - Personally Reviewed  Physical Exam   GEN: Obese female mild distrest Neck: Thick neck L IJ C/D/I Cardiac: RRR, Distant heart sounds Respiratory: Decreased breath volumes bilaterally GI: Soft, nontender, non-distended  MS: +2 edema; No deformity. Extremities are cool  Neuro:  Nonfocal  Psych: Normal affect   Labs    Chemistry Recent Labs  Lab 11/20/20 2141 11/20/20 2143 11/21/20 0258 11/21/20 0550  NA 134* 135 132* 136  K 3.1* 3.1* 4.6 4.4  CL 98  --  97* 97*  CO2 22  --  20* 25  GLUCOSE 150*  --  128* 161*  BUN 98*  --  99* 99*  CREATININE 3.90*  --  3.95* 4.21*  CALCIUM 9.1  --  9.3 9.2  PROT 5.6*  --   --   --   ALBUMIN 2.8*  --   --   --   AST 199*  --   --   --   ALT 297*  --   --   --   ALKPHOS 76  --   --   --   BILITOT 1.2  --   --   --   GFRNONAA 12*  --  12* 11*  ANIONGAP 14  --  15 14     Hematology Recent Labs  Lab 11/20/20 0318 11/20/20 2141 11/20/20 2143 11/21/20 0258  WBC 6.5 6.2  --  6.1  RBC 4.51 4.23  --  4.37  HGB 14.4 13.6 13.6 14.2  HCT 43.1 40.2 40.0 41.9  MCV 95.6 95.0  --  95.9  MCH 31.9 32.2  --  32.5  MCHC 33.4 33.8  --  33.9  RDW 13.6 13.6  --  13.7  PLT 277 261  --  261    Cardiac EnzymesNo results for input(s): TROPONINI in the last 168 hours. No results for input(s): TROPIPOC in the last 168 hours.   BNP Recent Labs  Lab 11/20/20 0318 11/21/20 0258  BNP 2,051.3* 2,022.4*     DDimer No results for input(s): DDIMER in the last 168 hours.   Radiology    US RENAL  Result Date:  11/20/2020 CLINICAL DATA:  AK I EXAM: RENAL / URINARY TRACT ULTRASOUND COMPLETE COMPARISON:  Renal ultrasound 10/29/2019 FINDINGS: Right Kidney: Renal measurements: 8.7 x 3.8 x 3.8 cm = volume: 67 mL. No hydronephrosis. No definite mass visualized. Cortical thinning. Left Kidney: Renal measurements: 8.3 x 4.9 x 4.3 cm = volume: 92 mL. No hydronephrosis. No definite mass visualized. Cortical thinning. Bladder: Appears normal for degree of bladder distention. Other: None. IMPRESSION: Limited study due to patient body habitus. No definite acute finding. Bilateral cortical thinning. Electronically Signed   By: Audie Pinto M.D.   On: 11/20/2020 16:26   DG Chest Portable 1 View  Result Date: 11/20/2020 CLINICAL DATA:  Dyspnea EXAM: PORTABLE CHEST 1 VIEW COMPARISON:  10/27/2019 FINDINGS: The examination is slightly limited by underpenetration. Pulmonary insufflation is stable. Retrocardiac opacification likely relates to underpenetration and overlying soft tissue. No definite focal pulmonary infiltrate. No pneumothorax or definite pleural effusion. Mild cardiomegaly is stable. Pulmonary vascularity is normal. IMPRESSION: Limited examination.  Stable cardiomegaly. Electronically Signed   By: Fidela Salisbury MD   On: 11/20/2020 03:56    Cardiac Studies   Bedside Echo performed 7 AM LVEF 25-30% global hypokinesis Mild to moderate RV dysfunction Moderate to Severe Ventricular Functional Mitral Regurgitation Mild to Moderate Tricuspid Regurgitation No significant AI No pericardial effusion  Patient Profile     65 y.o. female SLE, HTN, CKD Stage IIIB-IV and PAF who has escalated to cardiogenic shock  Assessment & Plan    Cardiogenic shock CKD Stage IV PAF s/p DCCV Morbid Obesity - last eliquis 11/20/20; now on heparin - continue IV amiodarone - Continue high dose milrininone and levophed; peri-RHC if additional support needed can start dobutamine - continue Lasix drip; I suspect she will need  CVVHD; Was told that nephrology had been consulted on 11/20/20 but we will need to re-engage to confirm - planned for RHC and possible support device - reached out to echo lab for formal echo - reached out to AHF team to give sign out - called daughter, Caitlyn Fuentes (  [phone]) to give update; she is a travel nurse and she is heading in to be with her mother  CRITICAL CARE Performed by: Lamyiah Crawshaw A Manroop Jakubowicz  Total critical care time: 90 minutes. Critical care time was exclusive of separately billable procedures and treating other patients. Critical care was necessary to treat or prevent imminent or life-threatening deterioration. Critical care was time spent personally by me on the following activities: development of treatment plan with patient and/or surrogate as well as nursing, discussions with consultants, evaluation of patient's response to treatment, examination of patient, obtaining history from patient or surrogate, ordering and performing treatments and interventions, ordering and review of laboratory studies, ordering and review of radiographic studies, pulse oximetry and re-evaluation of patient's condition.    Signed, Rudean Haskell, MD [place]  11/21/2020 7:34 AM     For questions or updates, please contact [facility] Please consult [email or web address] for contact info under Cardiology/STEMI.      Signed, Werner Lean, MD  11/21/2020, 7:22 AM

## 2020-11-21 NOTE — Progress Notes (Signed)
*  PRELIMINARY RESULTS* Echocardiogram 2D Echocardiogram has been performed.  Luisa Hart RDCS 11/21/2020, 8:33 AM

## 2020-11-21 NOTE — Progress Notes (Signed)
ANTICOAGULATION CONSULT NOTE - Initial Consult  Pharmacy Consult for Heparin Indication: atrial fibrillation  Allergies  Allergen Reactions   Diovan [Valsartan] Other (See Comments)    Messes up kidneys   Lisinopril Swelling    Facial swelling   Lisinopril Shortness Of Breath, Swelling and Other (See Comments)    Face, tongue, and mouth swell   Tape Rash and Other (See Comments)    PLEASE USE COBAN WRAP!!   Diovan [Valsartan] Other (See Comments)    Affected kidneys   Metolazone     Acute kidney injury in 07/2016   Zaroxolyn [Metolazone] Other (See Comments)    Caused acute kidney injury at a frequent dosage    Patient Measurements: Height: 5' 3.5" (161.3 cm) Weight: 126.4 kg (278 lb 9.6 oz) IBW/kg (Calculated) : 53.55 Heparin Dosing Weight: 85 kg  Vital Signs: Temp: 97.6 F (36.4 C) (08/01 0400) Temp Source: Oral (08/01 0400) BP: 83/64 (08/01 0600) Pulse Rate: 91 (08/01 0600)  Labs: Recent Labs    11/20/20 0318 11/20/20 0335 11/20/20 0600 11/20/20 2141 11/20/20 2143 11/21/20 0258  HGB 14.4  --   --  13.6 13.6 14.2  HCT 43.1  --   --  40.2 40.0 41.9  PLT 277  --   --  261  --  261  LABPROT  --   --   --   --   --  28.4*  INR  --   --   --   --   --  2.7*  CREATININE 3.73*  --   --  3.90*  --  3.95*  TROPONINIHS  --  38* 35*  --   --   --     Estimated Creatinine Clearance: 18.8 mL/min (A) (by C-G formula based on SCr of 3.95 mg/dL (H)).   Medical History: Past Medical History:  Diagnosis Date   A-fib ([place])    Cardiomyopathy    Hyperlipidemia    Hypertension    Lupus ([place])    Pulmonary hypertension ([place]) 05/10/2011   Echo, [id number]   Renal disorder    Sleep apnea 2008   CPAP, pt does not know settings   Thyroid disease     Medications:   calcitRIOL  0.25 mcg Oral Daily   Chlorhexidine Gluconate Cloth  6 each Topical Daily   clotrimazole  1 application Topical BID   estradiol  1 mg Oral Daily   febuxostat  40 mg Oral q AM   fentaNYL        fentaNYL (SUBLIMAZE) injection  100 mcg Intravenous Once   ferrous sulfate  325 mg Oral q morning   flumazenil       fluticasone  2 spray Each Nare Daily   fluticasone furoate-vilanterol  1 puff Inhalation Daily   gabapentin  100 mg Oral TID   levothyroxine  75 mcg Oral q morning   lidocaine  1 patch Transdermal Q24H   lidocaine (PF)       midazolam       midazolam  2 mg Intravenous Once   naloxone       ondansetron (ZOFRAN) IV  4 mg Intravenous Once   phenylephrine       phenylephrine       phenylephrine       potassium chloride  40 mEq Oral Daily   predniSONE  5 mg Oral Q breakfast   sodium bicarbonate       sodium chloride flush  3 mL Intravenous Q12H  Assessment: 65 y.o. female with  h/o Afib, DOAC on hold, for heparin.  Pt was on Xarelto PTA and changed to Eliquis due to ARF.  Eliquis 5 mg given at 2317 7/31.    Goal of Therapy:  Heparin level 0.3-0.7 units/ml aPTT 66-102 seconds Monitor platelets by anticoagulation protocol: Yes   Plan:  Start heparin 1300 units/hr at noon today aPTT in 8 hours  Alexxander Kurt, [place] 11/21/2020,6:18 AM

## 2020-11-21 NOTE — Progress Notes (Signed)
Received a page from cardiology through flow manager regarding the patient transfer to ICU by cardiology due to severe hypotension and Afib with RvR.  Called and spoke with Dr. Renella Cunas.  He will contact [facility] regarding the transfer to ICU.  States they will take it from here.  Advised to contact [place] if needed.

## 2020-11-21 NOTE — Consult Note (Addendum)
Advanced Heart Failure Team Consult Note   Primary Physician: Guadlupe Spanish, MD PCP-Cardiologist:  Shelva Majestic, MD  Reason for Consultation: Cardiogenic Shock   HPI:    Caitlyn Fuentes is seen today for evaluation of cardiogenic shock  at the request of Dr Renella Cunas.   Caitlyn Fuentes is a 65 year old with a history of chronic systolic hf previously EF 45%,  NICM, hypothyroid, PAF, gout, OSA, CKD Stage IV, HTN, hyperlipidemia, and lupus. .   Followed by Nephrology in [place] with adjustments made to diuretics. Torsemide stopped and placed on lasix. Since that time she noticed increased shortness of breath. Nephrology had started education on dialysis.   Admitted in 10/27/19  [facility] with A/C systolic heart failure. Diuresed with IV lasix.   Yesterday [facility] [place] with increased shortness of breath. EKG showed A fib RVR and placed on diltiazem drip. CXR with vascular congestion. Transferred to [facility]. BNP >2000.  Developed hypotension and given NS bolus and diltiazem drip stopped. Cardiology/Urology consulted.  Massive volume overload and was started on 120 mg IV lasix. Minimal urine output noted.  Continued to decline overnight and was transferred to ICU with evidence of cardiogenic shock. Lactic acid 1.6 with elevated LFTs.  A line and central access placed. CVP 20. Started on amio drip but worsening dyspnea and cool extremities she had urgent cardioversion with restoration of NSR. Placed on Neo + milrinone 0.25 mcg. Initial CO-OX 43%. ( Qx calculate CO 2.3 CI 0.98  SVR 2365). Milrinone increased to 0.375 mcg. POCUS showed EF down to form previous 45% --> 25-30%. Minimal urine output. Renal US with no acute findings.   Currently on Neo 10 mcg + Norepi 4 mcg+ 0.375 mcg milrinone + amio drip + lasix drip 20 mg per hour.   TSH 8. 2  HS Trop 38>35  Creatinine trending 3.7>3.95>4.2 Lactic Acid 1.6>1.5     Cardiac Testing.  2008 cath no significant CAD.  2018 Lexiscan  Myoview - EF 45% . There was a questionable inferolateral defect without associated ischemia.  Echo 11/15/2019 30-35% Echo 2020 EF 40-45%   Review of Systems: [y] = yes, [ ]  = no   General: Weight gain [ Y]; Weight loss [ ] ; Anorexia [ ] ; Fatigue [ Y]; Fever [ ] ; Chills [ ] ; Weakness [ Y]  Cardiac: Chest pain/pressure [ ] ; Resting SOB [ ] ; Exertional SOB [ Y]; Orthopnea [ Y]; Pedal Edema [ Y]; Palpitations [ ] ; Syncope [ ] ; Presyncope [ ] ; Paroxysmal nocturnal dyspnea[ ]   Pulmonary: Cough [ ] ; Wheezing[ ] ; Hemoptysis[ ] ; Sputum [ ] ; Snoring [ ]   GI: Vomiting[ ] ; Dysphagia[ ] ; Melena[ ] ; Hematochezia [ ] ; Heartburn[ ] ; Abdominal pain [ ] ; Constipation [ ] ; Diarrhea [ ] ; BRBPR [ ]   GU: Hematuria[ ] ; Dysuria [ ] ; Nocturia[ ]   Vascular: Pain in legs with walking [ ] ; Pain in feet with lying flat [ ] ; Non-healing sores [ ] ; Stroke [ ] ; TIA [ ] ; Slurred speech [ ] ;  Neuro: Headaches[ ] ; Vertigo[ ] ; Seizures[ ] ; Paresthesias[ ] ;Blurred vision [ ] ; Diplopia [ ] ; Vision changes [ ]   Ortho/Skin: Arthritis [ ] ; Joint pain [ Y]; Muscle pain [ ] ; Joint swelling [ ] ; Back Pain [ Y]; Rash [ ]   Psych: Depression[ ] ; Anxiety[ ]   Heme: Bleeding problems [ ] ; Clotting disorders [ ] ; Anemia [ ]   Endocrine: Diabetes [ ] ; Thyroid dysfunction[ [place] Medications Prior to Admission medications   Medication Sig Start  Date End Date Taking? Authorizing Provider  Albuterol Sulfate (PROAIR RESPICLICK) 119 (90 Base) MCG/ACT AEPB Inhale 2 puffs into the lungs daily as needed (for shortness of brath or wheezing).   Yes [provider]  atorvastatin (LIPITOR) 40 MG tablet Take 1 tablet (40 mg total) by mouth daily. 10/03/20  Yes Troy Sine, MD  Azelaic Acid 15 % cream Apply 1 application topically 2 (two) times daily as needed (to affected areas of the face).  05/28/19  Yes [provider]  carvedilol (COREG) 25 MG tablet Take 1 tablet (25 mg total) by mouth 2 (two) times daily. 02/08/20  Yes Almyra Deforest,  PA  clotrimazole (LOTRIMIN) 1 % cream Apply 1 application topically 2 (two) times daily. 02/11/19  Yes [provider]  colchicine 0.6 MG tablet Take 0.6 mg by mouth as needed (flare ups).   Yes [provider]  cyclobenzaprine (FLEXERIL) 10 MG tablet Take 10 mg by mouth 3 (three) times daily as needed for muscle spasms.  09/20/19  Yes [provider]  febuxostat (ULORIC) 40 MG tablet Take 40 mg by mouth in the morning. 10/15/19  Yes [provider]  ferrous sulfate 325 (65 FE) MG tablet Take 325 mg by mouth every morning. 12/02/19  Yes [provider]  fluticasone (FLONASE) 50 MCG/ACT nasal spray Place 2 sprays into both nostrils daily. 01/19/20  Yes [provider]  gabapentin (NEURONTIN) 100 MG capsule Take 100 mg by mouth 3 (three) times daily. 12/04/16  Yes [provider]  HYDROcodone-acetaminophen (NORCO/VICODIN) 5-325 MG tablet Take 1 tablet by mouth every 6 (six) hours as needed for moderate pain.   Yes [provider]  isosorbide mononitrate (IMDUR) 60 MG 24 hr tablet TAKE 1 TABLET BY MOUTH EVERY DAY Patient taking differently: Take 60 mg by mouth daily. 07/15/20  Yes Troy Sine, MD  levothyroxine (SYNTHROID) 75 MCG tablet Take 75 mcg by mouth every morning. 12/02/19  Yes [provider]  predniSONE (STERAPRED UNI-PAK 21 TAB) 5 MG (21) TBPK tablet Take 5 mg by mouth as directed. 09/15/19  Yes [provider]  rivaroxaban (XARELTO) 20 MG TABS tablet Take 1 tablet (20 mg total) by mouth daily with supper. 06/28/20  Yes Troy Sine, MD  torsemide (DEMADEX) 20 MG tablet TAKE 2 TABLETS (40 MG TOTAL) BY MOUTH 2 (TWO) TIMES DAILY. *NEEDS OFFICE VISIT FOR FURTHER REFILLS* Patient taking differently: Take 40 mg by mouth 2 (two) times daily. 03/29/20  Yes Troy Sine, MD  acetaminophen (TYLENOL) 500 MG tablet Take 500-1,000 mg by mouth every 6 (six) hours as needed for mild pain or headache.    [provider]  ADVAIR DISKUS 250-50 MCG/ACT AEPB Inhale 1 puff into the lungs 2 (two) times daily. 11/19/20   [provider]  aMILoride (MIDAMOR) 5 MG tablet Take 1 tablet (5 mg total) by mouth every morning. Patient not taking: Reported on 11/20/2020 02/08/20   Almyra Deforest, PA  calcitRIOL (ROCALTROL) 0.25 MCG capsule Take 0.25 mcg by mouth daily. 10/12/20   [provider]  estradiol (ESTRACE) 1 MG tablet Take 1 mg by mouth daily. 08/23/20   [provider]  hydrALAZINE (APRESOLINE) 10 MG tablet Take 1 tablet (10 mg total) by mouth every 8 (eight) hours. Patient not taking: Reported on 11/20/2020 02/08/20   Almyra Deforest, PA    Past Medical History: Past Medical History:  Diagnosis Date   A-fib [facility])    Cardiomyopathy    Hyperlipidemia  Hypertension    Lupus ([place])    Pulmonary hypertension ([place]) 05/10/2011   Echo, EF-40-45   Renal disorder    Sleep apnea 2008   CPAP, pt does not know settings   Thyroid disease     Past Surgical History: Past Surgical History:  Procedure Laterality Date    c sections  x 2   ABDOMINAL HYSTERECTOMY     complete   CARDIAC CATHETERIZATION     Nonischemic, EF-40, continued medical therapy   CARDIAC CATHETERIZATION  10/02/2006   No significant CAD   COLONOSCOPY WITH PROPOFOL N/A 11/22/2015   Procedure: COLONOSCOPY WITH PROPOFOL;  Surgeon: Juanita Craver, MD;  Location: [facility];  Service: Endoscopy;  Laterality: N/A;   ESOPHAGOGASTRODUODENOSCOPY (EGD) WITH PROPOFOL N/A 11/22/2015   Procedure: ESOPHAGOGASTRODUODENOSCOPY (EGD) WITH PROPOFOL;  Surgeon: Juanita Craver, MD;  Location: [facility];  Service: Endoscopy;  Laterality: N/A;    Family History: Family History  Problem Relation Age of Onset   Heart disease Mother    Heart disease Father    Lung disease Sister    Heart disease Brother    Hypertension Sister     Social History: Social History   Socioeconomic History   Marital status: Married    Spouse name: Not on  file   Number of children: Not on file   Years of education: Not on file   Highest education level: Not on file  Occupational History   Not on file  Tobacco Use   Smoking status: Never   Smokeless tobacco: Never  Vaping Use   Vaping Use: Never used  Substance and Sexual Activity   Alcohol use: Yes    Comment: once a month   Drug use: Never   Sexual activity: Not on file  Other Topics Concern   Not on file  Social History Narrative   ** Merged History Encounter **       Social Determinants of Health   Financial Resource Strain: Not on file  Food Insecurity: Not on file  Transportation Needs: Not on file  Physical Activity: Not on file  Stress: Not on file  Social Connections: Not on file    Allergies:  Allergies  Allergen Reactions   Diovan [Valsartan] Other (See Comments)    Messes up kidneys   Lisinopril Swelling    Facial swelling   Lisinopril Shortness Of Breath, Swelling and Other (See Comments)    Face, tongue, and mouth swell   Tape Rash and Other (See Comments)    PLEASE USE COBAN WRAP!!   Diovan [Valsartan] Other (See Comments)    Affected kidneys   Metolazone     Acute kidney injury in 07/2016   Zaroxolyn [Metolazone] Other (See Comments)    Caused acute kidney injury at a frequent dosage    Objective:    Vital Signs:   Temp:  [97.2 F (36.2 C)-97.9 F (36.6 C)] 97.6 F (36.4 C) (08/01 0400) Pulse Rate:  [2-126] 91 (08/01 0600) Resp:  [16-37] 37 (08/01 0600) BP: (60-167)/(23-137) 83/64 (08/01 0600) SpO2:  [96 %-100 %] 97 % (08/01 0600) Arterial Line BP: (76-115)/(57-79) 100/74 (08/01 0600)    Weight change: Filed Weights   11/20/20 0308 11/20/20 0325 11/20/20 0355  Weight: 119.7 kg 123.3 kg 126.4 kg    Intake/Output:   Intake/Output Summary (Last 24 hours) at 11/21/2020 0725 Last data filed at 11/21/2020 0600 Gross per 24 hour  Intake 441.72 ml  Output 210 ml  Net 231.72 ml  Physical Exam   CVP 20  General: appear weak.  No  resp difficulty HEENT: normal Neck: supple. JVP difficult to assess due to body habitus. . Carotids 2+ bilat; no bruits. No lymphadenopathy or thyromegaly appreciated. RIJ  Cor: PMI nondisplaced. Regular rate & rhythm. No rubs, gallops or murmurs. Lungs: Diminsed  Abdomen: soft, nontender, nondistended. No hepatosplenomegaly. No bruits or masses. Good bowel sounds. Extremities: cool, no cyanosis, clubbing, rash, R and LLE 3+ edema Neuro: alert & orientedx3, cranial nerves grossly intact. moves all 4 extremities w/o difficulty. Affect flat   Telemetry  NSR today  EKG    A fib RVR   Labs   Basic Metabolic Panel: Recent Labs  Lab 11/20/20 0318 11/20/20 2141 11/20/20 2143 11/21/20 0258 11/21/20 0550  NA 137 134* 135 132* 136  K 3.3* 3.1* 3.1* 4.6 4.4  CL 96* 98  --  97* 97*  CO2 27 22  --  20* 25  GLUCOSE 121* 150*  --  128* 161*  BUN 94* 98*  --  99* 99*  CREATININE 3.73* 3.90*  --  3.95* 4.21*  CALCIUM 9.2 9.1  --  9.3 9.2  MG  --  2.0  --  2.1  --     Liver Function Tests: Recent Labs  Lab 11/20/20 2141  AST 199*  ALT 297*  ALKPHOS 76  BILITOT 1.2  PROT 5.6*  ALBUMIN 2.8*   No results for input(s): LIPASE, AMYLASE in the last 168 hours. No results for input(s): AMMONIA in the last 168 hours.  CBC: Recent Labs  Lab 11/20/20 0318 11/20/20 2141 11/20/20 2143 11/21/20 0258  WBC 6.5 6.2  --  6.1  NEUTROABS 4.8  --   --   --   HGB 14.4 13.6 13.6 14.2  HCT 43.1 40.2 40.0 41.9  MCV 95.6 95.0  --  95.9  PLT 277 261  --  261    Cardiac Enzymes: No results for input(s): CKTOTAL, CKMB, CKMBINDEX, TROPONINI in the last 168 hours.  BNP: BNP (last 3 results) Recent Labs    11/20/20 0318 11/21/20 0258  BNP 2,051.3* 2,022.4*    ProBNP (last 3 results) No results for input(s): PROBNP in the last 8760 hours.   CBG: Recent Labs  Lab 11/20/20 1846 11/20/20 2358  GLUCAP 145* 119*    Coagulation Studies: Recent Labs    11/21/20 0258  LABPROT 28.4*   INR 2.7*     Imaging   US RENAL  Result Date: 11/20/2020 CLINICAL DATA:  AK I EXAM: RENAL / URINARY TRACT ULTRASOUND COMPLETE COMPARISON:  Renal ultrasound 10/29/2019 FINDINGS: Right Kidney: Renal measurements: 8.7 x 3.8 x 3.8 cm = volume: 67 mL. No hydronephrosis. No definite mass visualized. Cortical thinning. Left Kidney: Renal measurements: 8.3 x 4.9 x 4.3 cm = volume: 92 mL. No hydronephrosis. No definite mass visualized. Cortical thinning. Bladder: Appears normal for degree of bladder distention. Other: None. IMPRESSION: Limited study due to patient body habitus. No definite acute finding. Bilateral cortical thinning. Electronically Signed   By: Audie Pinto M.D.   On: 11/20/2020 16:26     Medications:     Current Medications:  calcitRIOL  0.25 mcg Oral Daily   Chlorhexidine Gluconate Cloth  6 each Topical Daily   clotrimazole  1 application Topical BID   estradiol  1 mg Oral Daily   febuxostat  40 mg Oral q AM   ferrous sulfate  325 mg Oral q morning   fluticasone  2 spray Each Nare  Daily   fluticasone furoate-vilanterol  1 puff Inhalation Daily   gabapentin  100 mg Oral TID   levothyroxine  75 mcg Oral q morning   lidocaine  1 patch Transdermal Q24H   phenylephrine       potassium chloride  40 mEq Oral Daily   predniSONE  5 mg Oral Q breakfast   sodium bicarbonate       sodium chloride flush  3 mL Intravenous Q12H    Infusions:  sodium chloride     amiodarone 60 mg/hr (11/21/20 0600)   amiodarone     furosemide (LASIX) 200 mg in dextrose 5% 100 mL (2mg /mL) infusion 20 mg/hr (11/21/20 0600)   heparin     milrinone     norepinephrine (LEVOPHED) Adult infusion 2 mcg/min (11/21/20 0701)      Patient Profile   Caitlyn Fuentes is a 65 year old with a history of chronic systolic hf previously EF 45%,  NICM, hypothyroid, PAF, gout, OSA, CKD Stage IV, HTN, hyperlipidemia, and lupus. .   Admitted with A fiB  RVR and cardiogenic shock.   Assessment/Plan    Acute/Chronic Systolic Heart Failure Cardiogenic shock  -Lactic acid 1.5. CO-OX 43%.-->QX calculate CO 2.3 CI 0.98. Had urgent cardioversion with restoration of NSR. Improved pressure.  - stop Neo. Continue milrinone 0.375 mcg + norepe 4 mcg. CO-OX repeated and now up to 55%--> QX calculate CO 3.3 CI 1.25 SVR 1648  - Echo completed and showed EF has fallen from  30-35% in 2021  to 20-25% today.  - CVP 20 with minimal urine output. Increase lasix drip to 30 mg per hour.  - No room for GDMT for now.   2. PAF--> Afib RVR - Developed shock and had rurgent cardioversion this morning with conversion to NSR.  - TSH 8. Check T3 and T4 - On amio drip -On heparin drip, previously on eliquis. Last dose of eliquis 11/19/20 at 2315.   3. AKI on CKD Stage IIIB -Recent Creatinine baseline 3.2  -Renal US with no acute findings.  -Worsening renal function suspect in the setting of shocK -Massive volume overload. Increase lasix drip but may need CVVHD.  - Consult nephrology   4. OSA Uses CPAP   5. Hypothyroidism  TSH 8. Check T3 and T4.  On synthroid.   6. Gout  Uric  Acid 10      Length of Stay: 1  Amy Clegg, NP  11/21/2020, 7:25 AM  Advanced Heart Failure Team Pager [phone] (M-F; 7a - 5p)  Please contact [facility] for night-coverage after hours (4p -7a ) and weekends on [email or web address]  Agree with above  61 y/o obese female (BMI 49), CKD IV (last SCR 3.2 with Dr. Dr. Dimas Aguas), SLE, OSA, PAF and systolic HF due to presumed NICM (last cath 2009).   Had admission to [facility] in 7/21 with ADHF. EF 30-35%. Discharged to rehab facility.   Since that time, lives at home with her husband and has very limited mobility. CAn get from room-to-room with [employer].   Has been followed by Dr. Claiborne Billings in our practice and Dr. Dimas Aguas in Renal in [place]. Last Scr 3.2 last week.   Admitted yesterday with worsening HF and volume overload in setting of recurrent AF of unknown duration. (Was in NSR in  11/21). Initially treated with IVF and IV diltiazem in ER. Patient then developed hypotension which improved after starting diltiazem. Scr 3.9. Given IV lasix 120 with minimal response.   Overnight decompensated with hypotension. Seen  by Dr. Renella Cunas. Arterial line and central access placed.  Lactate 1.5. Emergent DC-CV to NSR. Started on milrinone and phenylephrine. Co-ox 43%. HF team consulted. Switched phenylephrine to NE. Co-ox now 55%   Bedside echo EF 25%. RV ok  Now lying in bed comfortably. No orthopnea or PND. Occasional chest tightness. Remains in NSR  General:  Obese woman lying flat in bed. Weak appearing  No resp difficulty HEENT: normal Neck: supple. Thick hard to see JVP. LIJ central line  Carotids 2+ bilat; no bruits. No lymphadenopathy or thryomegaly appreciated. Cor: Distant HS Regular rate & rhythm.  Lungs: clear Abdomen: obese soft, nontender, nondistended. No hepatosplenomegaly. No bruits or masses. Good bowel sounds. Extremities: no cyanosis, clubbing, rash, 2-3+ edema cool Neuro: alert & orientedx3, cranial nerves grossly intact. moves all 4 extremities w/o difficulty. Affect pleasant  She has acute on chronic systolic HF with low output/cardiogenic shock in setting of recurrent AF and progressive renal failure. Unsure how much of this was triggered by AF versus progression of underlying HF and renal failure.  Now back in NSR after emergent DC-CV overnight. Co-ox improved on milrinone and low-dose NE.   Given progressive HF, renal failure and poor functional capacity she is not a candidate for advanced HF therapies. With advanced HF and poor functional capacity may also not be good long-term HD candidate.   For now would support with NE and milrinone and see if she will diurese with high-dose lasix and metolazone. Follow co-ox and CVP. If urine output remains poor can consider short-term trial of CRRT though no current acute indications for HD. (I have d/w Nephrology).    Will attempt to maintain NSR with IV amio. Continue heparin. Hopefully maintenance of NSR will help with some degree of myocardia recovery though  baseline EF was already fairly compromised.   Suspect we may need to consider Palliative Care consultation to further discuss [place].   I have d/w with her daughters.   Total CCT 90 mins  Glori Bickers, MD  9:21 AM

## 2020-11-21 NOTE — Significant Event (Signed)
Paged at 20:00 that patient was hypotensive. BP on arms didn't correlate and were variable. Able to get consistent pressures on LUE at 70/30 so RRT to [facility] for further care. Patient with mild-moderate SOB but otherwise no significant sx. Labs returned with hypoK (3.1), elevated transaminases (AST 199/ALT 297), with a normal lactic acid (1.6). TSH (8.2).   In 2H obtained A-line in L radial artery and BP cuff were not c/w this. BP cuff pressures much lower. A line pressures ~100/60s with AF in the 100-115 range. Patient otherwise comfortable with mild SOB.   Had received lasix 120 mg IV with minimal UOP earlier in night. Reordered for lasix 120 mg IV x1 and lasix 20 mg/h. Unfortunately did not put out much over the following 2 hours so placed LIJ CVL (keep RIJ open for possible vascath) for CoOx/CVP. CVP 20. BP still borderline. Resumed amio wo bolus as she started to have AF/RVR 120-140 and was symptomatic. Rate better controlled on amio but persistent over the next hour. Acutely diaphoretic and extremities went from luke warm to cold within 30 minutes. Explained risk/benefits of emergent DCCV despite 1 week off [place]. Patient understood and accepted these risks. Bedside TTE with global HK and EF ~25% while in AF/RVR in the 130s before amio had been resumed. Versed 2 mg/Fentanyl 100 mcg given. Shocked 120J x1 with restoration of NSR. Required phenylephrine 600 mcg in pushes and gtt to maintain. Started on milrinone 0.25 mcg. Checked an hour later and still with cold extremities and no UOP. Increased to 0.375 mcg, discussed with Dr. Loletha Grayer. Unfortunately CoOx is very low (42.7) so will likely need mechanical support. Still mentating but UOP minimal. Cath lab aware of need for RHC and likely mechanical support this morning.

## 2020-11-21 NOTE — Progress Notes (Signed)
Assisted tele visit to patient with family member.  Dejanay Wamboldt M, RN   

## 2020-11-21 NOTE — Progress Notes (Signed)
ANTICOAGULATION CONSULT NOTE - Follow-Up Consult  Pharmacy Consult for Heparin Indication: atrial fibrillation  Patient Measurements: Height: 5' 3.5" (161.3 cm) Weight: 126.4 kg (278 lb 9.6 oz) IBW/kg (Calculated) : 53.55 Heparin Dosing Weight: 85 kg  Vital Signs: Temp: 97.4 F (36.3 C) (08/01 1900) Temp Source: Oral (08/01 1900) BP: 108/91 (08/01 1100) Pulse Rate: 120 (08/01 2000)  Labs: Recent Labs    11/20/20 0318 11/20/20 0335 11/20/20 0600 11/20/20 2141 11/20/20 2143 11/21/20 0258 11/21/20 0550 11/21/20 1942  HGB 14.4  --   --  13.6 13.6 14.2  --   --   HCT 43.1  --   --  40.2 40.0 41.9  --   --   PLT 277  --   --  261  --  261  --   --   APTT  --   --   --   --   --   --   --  122*  LABPROT  --   --   --   --   --  28.4*  --   --   INR  --   --   --   --   --  2.7*  --   --   CREATININE 3.73*  --   --  3.90*  --  3.95* 4.21* 3.92*  TROPONINIHS  --  38* 35*  --   --   --   --   --      Estimated Creatinine Clearance: 18.9 mL/min (A) (by C-G formula based on SCr of 3.92 mg/dL (H)).   Medical History: Past Medical History:  Diagnosis Date   A-fib ([place])    Cardiomyopathy    Hyperlipidemia    Hypertension    Lupus ([place])    Pulmonary hypertension ([place]) 05/10/2011   Echo, EF-40-45   Renal disorder    Sleep apnea 2008   CPAP, pt does not know settings   Thyroid disease     Medications:   calcitRIOL  0.25 mcg Oral Daily   Chlorhexidine Gluconate Cloth  6 each Topical Daily   clotrimazole  1 application Topical BID   estradiol  1 mg Oral Daily   febuxostat  40 mg Oral q AM   ferrous sulfate  325 mg Oral q morning   fluticasone  2 spray Each Nare Daily   fluticasone furoate-vilanterol  1 puff Inhalation Daily   gabapentin  100 mg Oral TID   levothyroxine  75 mcg Oral q morning   lidocaine  1 patch Transdermal Q24H   potassium chloride  40 mEq Oral Daily   predniSONE  5 mg Oral Q breakfast   sodium chloride flush  10-40 mL Intracatheter Q12H    sodium chloride flush  3 mL Intravenous Q12H     Assessment: 65 y.o. female with  h/o Afib, DOAC on hold, for heparin.  Pt was on Xarelto PTA and changed to Eliquis due to ARF.  Eliquis 5 mg last given at 2317 7/31.   aPTT this evening is SUPRAtherapeutic (aPTT 122, goal of 66-102) - drawn from A-line right next to heparin infusion though drip paused for 5 minutes. Re-draw via central is confirmed high at 129 - will hold and reduce rate. No bleeding noted per discussion with RN.    Goal of Therapy:  Heparin level 0.3-0.7 units/ml aPTT 66-102 seconds Monitor platelets by anticoagulation protocol: Yes   Plan:  - Decrease Heparin to 1100 units/hr (11 ml/hr) - Will continue to monitor for  any signs/symptoms of bleeding and will follow up with aPTT level in 8 hours   Thank you for allowing pharmacy to be a part of this patient's care.  Alycia Rossetti, PharmD, BCPS Clinical Pharmacist Clinical phone for 11/21/2020: [phone] 11/21/2020 10:14 PM   **Pharmacist phone directory can now be found on [email or web address] ([email or web address]).  Listed under [employer].

## 2020-11-21 NOTE — Progress Notes (Signed)
  Patient now on milrinone and NE. Co-ox improved.   Feeling better. Has diuresed 1300cc today on lasix gtt. No further orthopnea.  Back in AF with RVR. Bolused with amio and gtt continued at 30/hr.   Seen by Nephrology and felt to be poor candidate for long-term HD but would consider CRRT as needed.    Will recheck co-ox and BMET.   Discussed with 2 daughters and son at bedside. Prognosis is guarded with end-stage HF and renal failure.   Additional CCT 35 mins.   Glori Bickers, MD  5:04 PM

## 2020-11-22 DIAGNOSIS — I4891 Unspecified atrial fibrillation: Secondary | ICD-10-CM | POA: Diagnosis not present

## 2020-11-22 DIAGNOSIS — R570 Cardiogenic shock: Secondary | ICD-10-CM | POA: Diagnosis not present

## 2020-11-22 LAB — RENAL FUNCTION PANEL
Albumin: 2.9 g/dL — ABNORMAL LOW (ref 3.5–5.0)
Anion gap: 15 (ref 5–15)
BUN: 91 mg/dL — ABNORMAL HIGH (ref 8–23)
CO2: 25 mmol/L (ref 22–32)
Calcium: 9.4 mg/dL (ref 8.9–10.3)
Chloride: 89 mmol/L — ABNORMAL LOW (ref 98–111)
Creatinine, Ser: 3.82 mg/dL — ABNORMAL HIGH (ref 0.44–1.00)
GFR, Estimated: 13 mL/min — ABNORMAL LOW (ref >60)
Glucose, Bld: 201 mg/dL — ABNORMAL HIGH (ref 70–99)
Phosphorus: 2.9 mg/dL (ref 2.5–4.6)
Potassium: 3.5 mmol/L (ref 3.5–5.1)
Sodium: 129 mmol/L — ABNORMAL LOW (ref 135–145)

## 2020-11-22 LAB — COMPREHENSIVE METABOLIC PANEL
ALT: 513 U/L — ABNORMAL HIGH (ref 0–44)
AST: 216 U/L — ABNORMAL HIGH (ref 15–41)
Albumin: 2.8 g/dL — ABNORMAL LOW (ref 3.5–5.0)
Alkaline Phosphatase: 71 U/L (ref 38–126)
Anion gap: 13 (ref 5–15)
BUN: 92 mg/dL — ABNORMAL HIGH (ref 8–23)
CO2: 27 mmol/L (ref 22–32)
Calcium: 9.3 mg/dL (ref 8.9–10.3)
Chloride: 91 mmol/L — ABNORMAL LOW (ref 98–111)
Creatinine, Ser: 3.82 mg/dL — ABNORMAL HIGH (ref 0.44–1.00)
GFR, Estimated: 13 mL/min — ABNORMAL LOW (ref >60)
Glucose, Bld: 144 mg/dL — ABNORMAL HIGH (ref 70–99)
Potassium: 2.8 mmol/L — ABNORMAL LOW (ref 3.5–5.1)
Sodium: 131 mmol/L — ABNORMAL LOW (ref 135–145)
Total Bilirubin: 1.2 mg/dL (ref 0.3–1.2)
Total Protein: 5.5 g/dL — ABNORMAL LOW (ref 6.5–8.1)

## 2020-11-22 LAB — MAGNESIUM: Magnesium: 1.7 mg/dL (ref 1.7–2.4)

## 2020-11-22 LAB — CBC
HCT: 38 % (ref 36.0–46.0)
Hemoglobin: 12.8 g/dL (ref 12.0–15.0)
MCH: 31.8 pg (ref 26.0–34.0)
MCHC: 33.7 g/dL (ref 30.0–36.0)
MCV: 94.3 fL (ref 80.0–100.0)
Platelets: 256 10*3/uL (ref 150–400)
RBC: 4.03 MIL/uL (ref 3.87–5.11)
RDW: 13.2 % (ref 11.5–15.5)
WBC: 7.5 10*3/uL (ref 4.0–10.5)
nRBC: 0.4 % — ABNORMAL HIGH (ref 0.0–0.2)

## 2020-11-22 LAB — BASIC METABOLIC PANEL
Anion gap: 16 — ABNORMAL HIGH (ref 5–15)
BUN: 92 mg/dL — ABNORMAL HIGH (ref 8–23)
CO2: 25 mmol/L (ref 22–32)
Calcium: 9.6 mg/dL (ref 8.9–10.3)
Chloride: 89 mmol/L — ABNORMAL LOW (ref 98–111)
Creatinine, Ser: 3.78 mg/dL — ABNORMAL HIGH (ref 0.44–1.00)
GFR, Estimated: 13 mL/min — ABNORMAL LOW (ref >60)
Glucose, Bld: 179 mg/dL — ABNORMAL HIGH (ref 70–99)
Potassium: 3.3 mmol/L — ABNORMAL LOW (ref 3.5–5.1)
Sodium: 130 mmol/L — ABNORMAL LOW (ref 135–145)

## 2020-11-22 LAB — COOXEMETRY PANEL
Carboxyhemoglobin: 1.1 % (ref 0.5–1.5)
Methemoglobin: 1 % (ref 0.0–1.5)
O2 Saturation: 71.4 %
Total hemoglobin: 12.9 g/dL (ref 12.0–16.0)

## 2020-11-22 LAB — APTT: aPTT: 106 seconds — ABNORMAL HIGH (ref 24–36)

## 2020-11-22 LAB — C3 COMPLEMENT: C3 Complement: 109 mg/dL (ref 82–167)

## 2020-11-22 LAB — C4 COMPLEMENT: Complement C4, Body Fluid: 25 mg/dL (ref 12–38)

## 2020-11-22 LAB — ANA W/REFLEX IF POSITIVE: Anti Nuclear Antibody (ANA): NEGATIVE

## 2020-11-22 LAB — T3, FREE: T3, Free: 2.6 pg/mL (ref 2.0–4.4)

## 2020-11-22 LAB — HEPARIN LEVEL (UNFRACTIONATED): Heparin Unfractionated: >1.1 IU/mL — ABNORMAL HIGH (ref 0.30–0.70)

## 2020-11-22 LAB — ANTI-DNA ANTIBODY, DOUBLE-STRANDED: ds DNA Ab: <1 IU/mL (ref 0–9)

## 2020-11-22 MED ORDER — POTASSIUM CHLORIDE CRYS ER 20 MEQ PO TBCR
60.0000 meq | EXTENDED_RELEASE_TABLET | Freq: Once | ORAL | Status: AC
  Administered 2020-11-22: 60 meq via ORAL
  Filled 2020-11-22: qty 3

## 2020-11-22 MED ORDER — PREDNISONE 10 MG PO TABS
5.0000 mg | ORAL_TABLET | Freq: Every day | ORAL | Status: DC
  Administered 2020-11-25 – 2020-12-19 (×25): 5 mg via ORAL
  Filled 2020-11-22 (×26): qty 1

## 2020-11-22 MED ORDER — LEVOTHYROXINE SODIUM 88 MCG PO TABS
88.0000 ug | ORAL_TABLET | Freq: Every morning | ORAL | Status: DC
  Administered 2020-11-23 – 2020-12-17 (×25): 88 ug via ORAL
  Filled 2020-11-22 (×25): qty 1

## 2020-11-22 MED ORDER — POTASSIUM CHLORIDE CRYS ER 20 MEQ PO TBCR
40.0000 meq | EXTENDED_RELEASE_TABLET | Freq: Once | ORAL | Status: AC
  Administered 2020-11-22: 40 meq via ORAL
  Filled 2020-11-22: qty 2

## 2020-11-22 MED ORDER — AMIODARONE LOAD VIA INFUSION
150.0000 mg | Freq: Once | INTRAVENOUS | Status: AC
  Administered 2020-11-22: 150 mg via INTRAVENOUS
  Filled 2020-11-22: qty 83.34

## 2020-11-22 MED ORDER — MAGNESIUM SULFATE 4 GM/100ML IV SOLN
4.0000 g | Freq: Once | INTRAVENOUS | Status: AC
  Administered 2020-11-22: 4 g via INTRAVENOUS
  Filled 2020-11-22: qty 100

## 2020-11-22 MED ORDER — PREDNISONE 20 MG PO TABS
30.0000 mg | ORAL_TABLET | Freq: Every day | ORAL | Status: AC
  Administered 2020-11-22: 25 mg via ORAL
  Administered 2020-11-23 – 2020-11-24 (×2): 30 mg via ORAL
  Filled 2020-11-22 (×3): qty 1

## 2020-11-22 MED ORDER — AMIODARONE LOAD VIA INFUSION
150.0000 mg | Freq: Once | INTRAVENOUS | Status: AC
  Administered 2020-11-22 (×2): 150 mg via INTRAVENOUS
  Filled 2020-11-22: qty 83.34

## 2020-11-22 MED ORDER — POTASSIUM CHLORIDE CRYS ER 20 MEQ PO TBCR
20.0000 meq | EXTENDED_RELEASE_TABLET | Freq: Once | ORAL | Status: AC
  Administered 2020-11-22: 20 meq via ORAL
  Filled 2020-11-22: qty 1

## 2020-11-22 NOTE — Procedures (Signed)
CENTRAL LINE PLACEMENT PROCEDURE NOTE Date: 11/21/20 Time: 0300 Operator: Sheral Apley. Renella Cunas, MD Indication: Medication delivery, pressor requirement, decompensation  Consent: Informed consent was obtained from the patient/family after risks, benefits, and alternatives were explained at length. A general consent form was signed and placed in the chart.  Procedure summary: A bedside ultrasound identified the left IJV. A time-out was performed verifying the correct patient, procedure, and body site. The neck was prepped and draped in the usual sterile fashion using chlorhexidine scrub. 1% lidocaine was used for local anesthesia at the site. A 16-gauge needle was inserted into the left IJV and advanced with negative pressure until venous blood was obtained. Using the Seldinger technique with wire, a triple lumen catheter was inserted into the left IJV. The wire was pulled out completely. Each of the 3 lumens drew back blood and were flushed with normal saline. The catheter was sutured into place and a sterile dressing with antimicrobial gel was placed over the insertion site. The patient tolerated the procedure well and there were no immediate complications. The catheter is ready for immediate use.   Estimated blood loss: < 5 cc  Immediate complications: None I attest that I supervised and assisted the resident physician for all key and critical portions of the procedure, including use of the ultrasound.  Dion Body, MD

## 2020-11-22 NOTE — Progress Notes (Signed)
Pt will place self on/off cpap as needed. RT will  monitor.

## 2020-11-22 NOTE — TOC Initial Note (Signed)
Transition of Care [facility]) - Initial/Assessment Note    Patient Details  Name: Caitlyn Fuentes MRN: [id number] Date of Birth: [birth year]  Transition of [facility]) CM/SW Contact:    Erenest Rasher, RN Phone Number: [phone] 11/22/2020, 3:08 PM  Clinical Narrative:                 TOC CM spoke to pt, dtr Andee Poles and husband at bedside and offered choice for [facility]. Pt states she had [facility] in the past. Explained to pt that we are waiting PT recommendations for home. PT may recommend SNF rehab, explained how rehab works at [facility]. Will have HF TOC CSW follow for possible SNF. Pt's family is leaning more toward SNF and will discuss with pt. Pt has a Rollator at home but states it is older that she borrowed from family. Per [place], Freda Munro, pt had wheelchair last year and she would have to pay out of pocket for Rollator. She also has a bedside commode at home. TOC CM sent message to [place] for [facility] vs SNF rehab. Waiting PT/OT recommendations.   Expected Discharge Plan: [place] Barriers to Discharge: Continued Medical Work up   Patient Goals and CMS Choice Patient states their goals for this hospitalization and ongoing recovery are:: patient states she wants get stronger   Choice offered to / list presented to : Patient  Expected Discharge Plan and Services Expected Discharge Plan: [place] In-house Referral: Clinical Social Work Discharge Planning Services: CM Consult Post Acute Care Choice: [place] arrangements for the past 2 months: [place]                 DME Arranged: Walker rolling with seat DME Agency: [employer] Date DME Agency Contacted: 11/22/20 Time DME Agency Contacted: [phone] Representative spoke with at DME Agency: Melina Modena HH Arranged: RN, PT, OT [facility] Agency: [place] Date [place]: 11/22/20   Representative spoke with at [place]: Adela Lank  Prior Living  Arrangements/Services Living arrangements for the past 2 months: [place] Lives with:: Spouse Patient language and need for interpreter reviewed:: Yes        Need for Family Participation in Patient Care: Yes (Comment) Care giver support system in place?: Yes (comment) Current home services: DME (rollator (older), bedside commode) Criminal Activity/Legal Involvement Pertinent to Current Situation/Hospitalization: No - Comment as needed  Activities of Daily Living      Permission Sought/Granted Permission sought to share information with : Case Manager, PCP, Family Supports Permission granted to share information with : Yes, Verbal Permission Granted  Share Information with NAME: Orion Crook  Permission granted to share info w AGENCY: Home Health, DME  Permission granted to share info w Relationship: daughter  Permission granted to share info w Contact Information: [phone]  Emotional Assessment Appearance:: Appears stated age Attitude/Demeanor/Rapport: Gracious Affect (typically observed): Accepting Orientation: : Oriented to Self, Oriented to Place, Oriented to  Time, Oriented to Situation   Psych Involvement: No (comment)  Admission diagnosis:  Atrial fibrillation with rapid ventricular response (HCC) [I48.91] Atrial fibrillation with RVR (HCC) [I48.91] Acute renal failure superimposed on stage 4 chronic kidney disease, unspecified acute renal failure type ([place]) [N17.9, N18.4] CHF (congestive heart failure) ([place]) [I50.9] Patient Active Problem List   Diagnosis Date Noted   Atrial fibrillation with rapid ventricular response ([place]) 11/20/2020   CHF (congestive heart failure) ([place]) 11/20/2020   CKD (  chronic kidney disease) stage 4, GFR 15-29 ml/min (HCC) 10/16/2019   AKI (acute kidney injury) ([place]) 02/72/5366   Chronic systolic congestive heart failure ([place]) 10/16/2019   Gout due to renal impairment 10/16/2019   Morbid obesity ([place]) 10/16/2019   Hypokalemia  10/15/2019   CKD (chronic kidney disease) stage 3, GFR 30-59 ml/min (HCC) 08/28/2016   Dizziness 08/17/2016   Acute hypokalemia 08/17/2016   Acute hyponatremia 08/17/2016   Chest tightness 08/17/2016   Near syncope 08/17/2016   Nonischemic cardiomyopathy ([place]) 05/25/2015   Chronic combined systolic and diastolic heart failure ([place]) 05/25/2015   Palpitations 05/25/2015   Gout 10/16/2013   Hyperlipidemia 10/16/2013   Long term current use of anticoagulant therapy 07/20/2013   Chest pain, atypical 08/16/2011   Morbid obesity ([place]) 08/16/2011   Bilateral lower extremity edema 08/16/2011   Obstructive sleep apnea on CPAP 08/16/2011   Paroxysmal atrial fibrillation ([place]) 08/16/2011   PCP:  Guadlupe Spanish, MD Pharmacy:   [id number] [place] [address] AT [address]OSS [address] [place] [address] Phone: [phone] Fax: [phone]     Social Determinants of Health (SDOH) Interventions    Readmission Risk Interventions No flowsheet data found.

## 2020-11-22 NOTE — Progress Notes (Addendum)
Advanced Heart Failure Rounding Note  PCP-Cardiologist: Shelva Majestic, MD   Subjective:   8/1 A fib RVR & A/C systolic heart faiure --> cardiogenic shock. Had urgent DC-CV with brief conversion to NSR but went back in A fib. . On Norepi + milrinone. AKI. Nephrology consulted.   Norepi 2 + Milrinone 0.375 mcg+ amio 60 + lasix drip 30. CO-OX 71%. Brisk diuresis noted.   Complaining of RLE pain. Pain improved with oxycodone. Denies SOB.    Objective:   Weight Range: 128.1 kg Body mass index is 49.24 kg/m.   Vital Signs:   Temp:  [96.4 F (35.8 C)-97.6 F (36.4 C)] 97.6 F (36.4 C) (08/02 0400) Pulse Rate:  [31-134] 113 (08/02 0500) Resp:  [16-32] 18 (08/02 0500) BP: (81-129)/(23-98) 108/91 (08/01 1100) SpO2:  [91 %-100 %] 96 % (08/02 0500) Arterial Line BP: (96-137)/(48-86) 106/54 (08/02 0500) Weight:  [128.1 kg] 128.1 kg (08/02 0500) Last BM Date:  (PTA)  Weight change: Filed Weights   11/20/20 0325 11/20/20 0355 11/22/20 0500  Weight: 123.3 kg 126.4 kg 128.1 kg    Intake/Output:   Intake/Output Summary (Last 24 hours) at 11/22/2020 0707 Last data filed at 11/22/2020 0400 Gross per 24 hour  Intake 2292.84 ml  Output 4650 ml  Net -2357.16 ml      Physical Exam   CVP 13 personally checked.  General: . No resp difficulty HEENT: Normal Neck: Supple. JVP difficult to assess due to body habitus.  Carotids 2+ bilat; no bruits. No lymphadenopathy or thyromegaly appreciated. LIJ  Cor: PMI nondisplaced. Irregular rate & rhythm. No rubs, gallops or murmurs. ZOll pads in place Lungs: Clear Abdomen: obese, soft, nontender, nondistended. No hepatosplenomegaly. No bruits or masses. Good bowel sounds. Extremities: No cyanosis, clubbing, rash, R and LLE  2+edema Neuro: Alert & orientedx3, cranial nerves grossly intact. moves all 4 extremities w/o difficulty. Affect pleasant GU : foley clear urine    Telemetry  A fib RVR 130s   EKG    N/A  Labs    CBC Recent Labs     11/20/20 0318 11/20/20 2141 11/21/20 0258 11/22/20 0447  WBC 6.5   < > 6.1 7.5  NEUTROABS 4.8  --   --   --   HGB 14.4   < > 14.2 12.8  HCT 43.1   < > 41.9 38.0  MCV 95.6   < > 95.9 94.3  PLT 277   < > 261 256   < > = values in this interval not displayed.   Basic Metabolic Panel Recent Labs    11/20/20 2141 11/20/20 2143 11/21/20 0258 11/21/20 0550 11/21/20 1942 11/22/20 0447  NA 134*   < > 132*   < > 132* 131*  K 3.1*   < > 4.6   < > 3.4* 2.8*  CL 98  --  97*   < > 95* 91*  CO2 22  --  20*   < > 24 27  GLUCOSE 150*  --  128*   < > 177* 144*  BUN 98*  --  99*   < > 98* 92*  CREATININE 3.90*  --  3.95*   < > 3.92* 3.82*  CALCIUM 9.1  --  9.3   < > 9.3 9.3  MG 2.0  --  2.1  --   --   --    < > = values in this interval not displayed.   Liver Function Tests Recent Labs    11/20/20  2141 11/22/20 0447  AST 199* 216*  ALT 297* 513*  ALKPHOS 76 71  BILITOT 1.2 1.2  PROT 5.6* 5.5*  ALBUMIN 2.8* 2.8*   No results for input(s): LIPASE, AMYLASE in the last 72 hours. Cardiac Enzymes No results for input(s): CKTOTAL, CKMB, CKMBINDEX, TROPONINI in the last 72 hours.  BNP: BNP (last 3 results) Recent Labs    11/20/20 0318 11/21/20 0258  BNP 2,051.3* 2,022.4*    ProBNP (last 3 results) No results for input(s): PROBNP in the last 8760 hours.   D-Dimer No results for input(s): DDIMER in the last 72 hours. Hemoglobin A1C No results for input(s): HGBA1C in the last 72 hours. Fasting Lipid Panel No results for input(s): CHOL, HDL, LDLCALC, TRIG, CHOLHDL, LDLDIRECT in the last 72 hours. Thyroid Function Tests Recent Labs    11/20/20 2140 11/21/20 0826  TSH 8.239*  --   T3FREE  --  2.6    Other results:   Imaging    ECHOCARDIOGRAM COMPLETE  Result Date: 11/21/2020    ECHOCARDIOGRAM REPORT   Patient Name:   NISHA DHAMI Date of Exam: 11/21/2020 Medical Rec #:  [id number]         Height:       63.5 in Accession #:    [id number]        Weight:        278.6 lb Date of Birth:  Sep 30, 1955         BSA:          2.239 m Patient Age:    65 years          BP:           124/77 mmHg Patient Gender: F                 HR:           91 bpm. Exam Location:  Inpatient Procedure: 2D Echo, Cardiac Doppler and Color Doppler Indications:     CHF  History:         Patient has prior history of Echocardiogram examinations, most                  recent 12/25/2018. Cardiomyopathy, Pulmonary HTN,                  Arrythmias:Atrial Fibrillation; Risk Factors:Dyslipidemia and                  Hypertension.  Sonographer:     Luisa Hart RDCS Referring Phys:  [id number] Lequita Halt Diagnosing Phys: Oswaldo Milian MD  Sonographer Comments: Technically challenging study due to limited acoustic windows and patient is morbidly obese. Image acquisition challenging due to patient body habitus and Image acquisition challenging due to respiratory motion. IMPRESSIONS  1. Left ventricular ejection fraction, by estimation, is 25 to 30%. The left ventricle has severely decreased function. The left ventricle demonstrates global hypokinesis. There is moderate left ventricular hypertrophy. Left ventricular diastolic parameters are indeterminate. Apex not well visualized, consider limited echo with contrast to evaluate for apical thrombus given severe systolic dysfunction.  2. Right ventricular systolic function is mildly reduced. The right ventricular size is normal. There is mildly elevated pulmonary artery systolic pressure. The estimated right ventricular systolic pressure is 02.4 mmHg.  3. The mitral valve is normal in structure. Moderate mitral valve regurgitation.  4. The aortic valve was not well visualized. Aortic valve regurgitation is not visualized. AV gradients were not measured.  5. The inferior vena  cava is dilated in size with <50% respiratory variability, suggesting right atrial pressure of 15 mmHg. FINDINGS  Left Ventricle: Left ventricular ejection fraction, by estimation, is 25 to  30%. The left ventricle has severely decreased function. The left ventricle demonstrates global hypokinesis. The left ventricular internal cavity size was normal in size. There is moderate left ventricular hypertrophy. Left ventricular diastolic parameters are indeterminate. Right Ventricle: The right ventricular size is normal. Right vetricular wall thickness was not well visualized. Right ventricular systolic function is mildly reduced. There is mildly elevated pulmonary artery systolic pressure. The tricuspid regurgitant velocity is 2.67 m/s, and with an assumed right atrial pressure of 15 mmHg, the estimated right ventricular systolic pressure is 65.9 mmHg. Left Atrium: Left atrial size was normal in size. Right Atrium: Right atrial size was normal in size. Pericardium: Trivial pericardial effusion is present. Mitral Valve: The mitral valve is normal in structure. Moderate mitral valve regurgitation. Tricuspid Valve: The tricuspid valve is normal in structure. Tricuspid valve regurgitation is trivial. Aortic Valve: The aortic valve was not well visualized. Aortic valve regurgitation is not visualized. Pulmonic Valve: The pulmonic valve was not well visualized. Pulmonic valve regurgitation is not visualized. Aorta: The aortic root and ascending aorta are structurally normal, with no evidence of dilitation. Venous: The inferior vena cava is dilated in size with less than 50% respiratory variability, suggesting right atrial pressure of 15 mmHg. IAS/Shunts: The interatrial septum was not well visualized.  LEFT VENTRICLE PLAX 2D LVIDd:         5.00 cm LVIDs:         3.30 cm LV PW:         1.20 cm LV IVS:        1.40 cm LVOT diam:     2.50 cm LV SV:         47 LV SV Index:   21 LVOT Area:     4.91 cm  LV Volumes (MOD) LV vol d, MOD A2C: 129.0 ml LV vol d, MOD A4C: 123.0 ml LV vol s, MOD A2C: 86.2 ml LV vol s, MOD A4C: 86.2 ml LV SV MOD A2C:     42.8 ml LV SV MOD A4C:     123.0 ml LV SV MOD BP:      38.8 ml RIGHT  VENTRICLE RV Basal diam:  4.10 cm RV Mid diam:    2.40 cm TAPSE (M-mode): 1.5 cm LEFT ATRIUM             Index       RIGHT ATRIUM           Index LA diam:        3.90 cm 1.74 cm/m  RA Area:     19.30 cm LA Vol (A2C):   77.8 ml 34.74 ml/m RA Volume:   52.20 ml  23.31 ml/m LA Vol (A4C):   55.1 ml 24.61 ml/m LA Biplane Vol: 70.9 ml 31.66 ml/m  AORTIC VALVE LVOT Vmax:   73.75 cm/s LVOT Vmean:  48.200 cm/s LVOT VTI:    0.096 m  AORTA Ao Root diam: 3.40 cm Ao Asc diam:  3.30 cm MITRAL VALVE                 TRICUSPID VALVE MV Area (PHT): 6.02 cm      TR Peak grad:   28.5 mmHg MV Decel Time: 126 msec      TR Vmax:        267.00 cm/s MR Peak  grad:    75.0 mmHg MR Mean grad:    49.0 mmHg   SHUNTS MR Vmax:         433.00 cm/s Systemic VTI:  0.10 m MR Vmean:        328.0 cm/s  Systemic Diam: 2.50 cm MR PISA:         1.57 cm MR PISA Eff ROA: 8 mm MR PISA Radius:  0.50 cm MV E velocity: 109.00 cm/s Oswaldo Milian MD Electronically signed by Oswaldo Milian MD Signature Date/Time: 11/21/2020/10:18:11 AM    Final (Updated)      Medications:     Scheduled Medications:  calcitRIOL  0.25 mcg Oral Daily   Chlorhexidine Gluconate Cloth  6 each Topical Daily   clotrimazole  1 application Topical BID   estradiol  1 mg Oral Daily   febuxostat  40 mg Oral q AM   ferrous sulfate  325 mg Oral q morning   fluticasone  2 spray Each Nare Daily   fluticasone furoate-vilanterol  1 puff Inhalation Daily   gabapentin  100 mg Oral TID   levothyroxine  75 mcg Oral q morning   lidocaine  1 patch Transdermal Q24H   potassium chloride  40 mEq Oral Daily   potassium chloride  20 mEq Oral Once   predniSONE  5 mg Oral Q breakfast   sodium chloride flush  10-40 mL Intracatheter Q12H   sodium chloride flush  3 mL Intravenous Q12H    Infusions:  sodium chloride     amiodarone 60 mg/hr (11/22/20 0649)   furosemide (LASIX) 200 mg in dextrose 5% 100 mL (2mg /mL) infusion 30 mg/hr (11/22/20 0649)   heparin 1,100  Units/hr (11/22/20 0400)   milrinone 0.375 mcg/kg/min (11/22/20 0400)   norepinephrine (LEVOPHED) Adult infusion 3 mcg/min (11/22/20 0400)    PRN Medications: sodium chloride, albuterol, cyclobenzaprine, HYDROcodone-acetaminophen, ondansetron (ZOFRAN) IV, oxyCODONE, sodium chloride flush, sodium chloride flush    Patient Profile   Ms Jaso is a 65 year old with a history of chronic systolic hf previously EF 45%,  NICM, hypothyroid, PAF, gout, OSA, CKD Stage IV, HTN, hyperlipidemia, and lupus.   Admitted with A fib RVR and A/C systolic heart failure complicated by cardiogenic shock.      Assessment/Plan  Acute/Chronic Systolic Heart Failure Cardiogenic shock -8/1 Lactic acid 1.5. CO-OX 43%.--> milrinone increased 0.375 mcg + norepe 4 mcg. CO-OX repeated with improvement.  - Echo completed and showed EF has fallen from  30-35% in 2021  to 25-30%.  - CO-OX 71% on norepi 2 mcg + milrinone 0.375 mcg.  - CVP coming down 20-->13. Brisk diuresis noted. Continue lasix drip.  - Supp K. Renal function a little better.  - - No room for GDMT for now.   2. PAF--> Afib RVR - Developed shock and had rurgent cardioversion this morning with conversion to NSR. - TSH elevated. Increase synthroid.   - Rate uncontrolled. GIve 150 mg bolus now. Continue amio drip 30 mg per hour.  On amio drip -On heparin drip, previously on eliquis. Last dose of eliquis 11/19/20 at 2315.   3. AKI on CKD Stage IV -Recent Creatinine baseline 3.2 -> peaked at 4.2. Now 3.8 -Renal US with no acute findings. -Worsening renal function suspect in the setting of shocK -Brisk diuresis noted. Creatinine trending down.  - Nephrology following.    4. OSA Uses CPAP   5. Hypothyroidism -TSH 8. Check T3 ok and T4 2.3  -On synthroid will increase to 88 mcg.  -  Repeat TSH in 6 weeks.    6. Gout -Uric  Acid 10. Pain RLE ? Flare  - On uloric 40 mg daily  - Give 40 mg prednisone daily x 3 days.   7. Hypokalemia  Supp  K. Repeat BMEt this morning.   8. Deconditioning Consult PT   Length of Stay: 2  Amy Clegg, NP  11/22/2020, 7:07 AM  Advanced Heart Failure Team Pager [phone] (M-F; 7a - 5p)  Please contact [facility] for night-coverage after hours (5p -7a ) and weekends on [email or web address]  Agree with above.  She is now on low-dose NE and milrinone 0.375. Also on lasix gtt at 30. Had diuresed well. SCr improving slightly improved. Breathing much better. Co-ox 42% -> 72%   Echo 25-30%. C/o RLE pain.  General:  Sitting up in bed. No resp difficulty HEENT: normal Neck: supple. LIJ TLC Carotids 2+ bilat; no bruits. No lymphadenopathy or thryomegaly appreciated. Cor: PMI nondisplaced. Irregular tachyNo rubs, gallops or murmurs. Lungs: clear Abdomen: obese soft, nontender, nondistended. No hepatosplenomegaly. No bruits or masses. Good bowel sounds. Extremities: no cyanosis, clubbing, rash, 2+ edema Neuro: alert & orientedx3, cranial nerves grossly intact. moves all 4 extremities w/o difficulty. Affect pleasant  Hemodynamically improved on dual pressors.Co-ox up. Diuresing well. SCr slightly improved. Remains volume overloaded. Continue IV lasix.   Back in AF. Will continue IV amio and heparin. If doesn't convert will need TEE & DC-CV once off inotropes.  Will advanced CKD and advanced HF; long-term options are limited. Hopefully will stabilize some once back in NSR.  Supp K aggressively.   CRITICAL CARE Performed by: Glori Bickers  Total critical care time: 35 minutes  Critical care time was exclusive of separately billable procedures and treating other patients.  Critical care was necessary to treat or prevent imminent or life-threatening deterioration.  Critical care was time spent personally by me (independent of midlevel providers or residents) on the following activities: development of treatment plan with patient and/or surrogate as well as nursing, discussions with consultants,  evaluation of patient's response to treatment, examination of patient, obtaining history from patient or surrogate, ordering and performing treatments and interventions, ordering and review of laboratory studies, ordering and review of radiographic studies, pulse oximetry and re-evaluation of patient's condition.  Glori Bickers, MD  8:45 AM

## 2020-11-22 NOTE — Progress Notes (Addendum)
Nephrology Follow-Up Consult note   Assessment/Recommendations: Caitlyn Fuentes is a/an 65 y.o. female with a past medical history significant for atrial fibrillation, HFrEF 2/2 NICM, CKD IV, SLE, HTN, COPD, pHTN, gout, hypothyroidism  who present w/ CHF exacerbation w/ shock    Oliguric AKI, improving: Likely secondary to cardiorenal syndrome/shock.  She had great response to diuretics as well as inotropes/pressors. -Management of shock as below -Continue with diuresis as below -Continue to monitor daily Cr, Dose meds for GFR -Monitor Daily I/Os, Daily weight -Maintain MAP>65 for optimal renal perfusion. -Avoid nephrotoxic medications including NSAIDs and Vanc/Zosyn combo -Urinalysis is bland -Poor long-term dialysis candidate.  Fortunately no need for dialysis at this time given she is improving   Heart failure exacerbation/shock: Concern for cardiogenic shock.  Heart failure managing.  Currently on milrinone, norepinephrine, and Lasix infusion.  We will continue with aggressive diuresis as she seems to be improving.  Hypokalemia: Secondary to increased urine output with Lasix.  Continue with repletion.  Watch potassium on labs today.  Consider spironolactone when she is more stable   Hypertension: Holding home medications History of SLE: Unclear details.  Urine without proteins of lupus unlikely contributing. Atrial fibrillation: Required cardioversion but back in atrial fibrillation.  On heparin and amiodarone. OSA: On CPAP Hypothyroidism: On Synthroid   Recommendations conveyed to primary service.    [facility] 11/22/2020 9:20 AM  ___________________________________________________________  CC: AKI, cardiogenic shock  Interval History/Subjective: Significant urine output over the past 24 hours with 4.7 L.  Creatinine improved to 3.8.  Some right lower extremity pain but overall states that she feels much better with less shortness of  breath.   Medications:  Current Facility-Administered Medications  Medication Dose Route Frequency Provider Last Rate Last Admin   0.9 %  sodium chloride infusion  250 mL Intravenous PRN Wynetta Fines T, MD       albuterol (PROVENTIL) (2.5 MG/3ML) 0.083% nebulizer solution 2.5 mg  2.5 mg Nebulization Daily PRN Wynetta Fines T, MD       amiodarone (NEXTERONE PREMIX) 360-4.14 MG/200ML-% (1.8 mg/mL) IV infusion  60 mg/hr Intravenous Continuous Bensimhon, Shaune Pascal, MD 33.3 mL/hr at 11/22/20 0800 60 mg/hr at 11/22/20 0800   calcitRIOL (ROCALTROL) capsule 0.25 mcg  0.25 mcg Oral Daily Wynetta Fines T, MD   0.25 mcg at 11/21/20 1046   Chlorhexidine Gluconate Cloth 2 % PADS 6 each  6 each Topical Daily Lequita Halt, MD   6 each at 11/21/20 1448   clotrimazole (LOTRIMIN) 1 % cream 1 application  1 application Topical BID Wynetta Fines T, MD   1 application at 76/28/31 2116   cyclobenzaprine (FLEXERIL) tablet 10 mg  10 mg Oral TID PRN Lequita Halt, MD       estradiol (ESTRACE) tablet 1 mg  1 mg Oral Daily Wynetta Fines T, MD   1 mg at 11/21/20 1048   febuxostat (ULORIC) tablet 40 mg  40 mg Oral q AM Lequita Halt, MD   40 mg at 11/22/20 5176   ferrous sulfate tablet 325 mg  325 mg Oral q morning Wynetta Fines T, MD   325 mg at 11/21/20 1303   fluticasone (FLONASE) 50 MCG/ACT nasal spray 2 spray  2 spray Each Nare Daily Wynetta Fines T, MD   2 spray at 11/21/20 1119   fluticasone furoate-vilanterol (BREO ELLIPTA) 200-25 MCG/INH 1 puff  1 puff Inhalation Daily Lequita Halt, MD   1 puff at 11/22/20 [id number]  furosemide (LASIX) 200 mg in dextrose 5 % 100 mL (2 mg/mL) infusion  30 mg/hr Intravenous Continuous Bensimhon, Shaune Pascal, MD 15 mL/hr at 11/22/20 0800 30 mg/hr at 11/22/20 0800   gabapentin (NEURONTIN) capsule 100 mg  100 mg Oral TID Wynetta Fines T, MD   100 mg at 11/21/20 2115   heparin ADULT infusion 100 units/mL (25000 units/268m)  1,000 Units/hr Intravenous Continuous BEinar Grad RPH 10 mL/hr at 11/22/20  0811 1,000 Units/hr at 11/22/20 01610  HYDROcodone-acetaminophen (NORCO/VICODIN) 5-325 MG per tablet 1 tablet  1 tablet Oral Q6H PRN ZLequita Halt MD       [Derrill MemoON 11/23/2020] levothyroxine (SYNTHROID) tablet 88 mcg  88 mcg Oral q morning Clegg, Amy D, NP       lidocaine (LIDODERM) 5 % 1 patch  1 patch Transdermal Q24H ZWynetta FinesT, MD   1 patch at 11/21/20 1049   milrinone (PRIMACOR) 20 MG/100 ML (0.2 mg/mL) infusion  0.375 mcg/kg/min Intravenous Continuous CDion Body MD 14.22 mL/hr at 11/22/20 0810 0.375 mcg/kg/min at 11/22/20 0810   norepinephrine (LEVOPHED) 431min 25047mremix infusion  0-40 mcg/min Intravenous Titrated CarDion BodyD 7.5 mL/hr at 11/22/20 0800 2 mcg/min at 11/22/20 0800   ondansetron (ZOFRAN) injection 4 mg  4 mg Intravenous Q6H PRN ZhaWynetta Fines MD   4 mg at 11/21/20 [id number] (Oxy IR/ROXICODONE) immediate release tablet 5 mg  5 mg Oral Q6H PRN CarDion BodyD   5 mg at 11/22/20 0146   potassium chloride (KLOR-CON) CR tablet 40 mEq  40 mEq Oral Daily CarDion BodyD   40 mEq at 11/22/20 [id number] (DELTASONE) tablet 30 mg  30 mg Oral Q breakfast Clegg, Amy D, NP   25 mg at 11/22/20 0835   [START ON 11/25/2020] predniSONE (DELTASONE) tablet 5 mg  5 mg Oral Q breakfast Clegg, Amy D, NP       sodium chloride flush (NS) 0.9 % injection 10-40 mL  10-40 mL Intracatheter Q12H Chandrasekhar, Mahesh A, MD   10 mL at 11/21/20 2114   sodium chloride flush (NS) 0.9 % injection 10-40 mL  10-40 mL Intracatheter PRN Chandrasekhar, Mahesh A, MD       sodium chloride flush (NS) 0.9 % injection 3 mL  3 mL Intravenous Q12H ZhaWynetta Fines MD   3 mL at 11/21/20 2114   sodium chloride flush (NS) 0.9 % injection 3 mL  3 mL Intravenous PRN ZhaWynetta Fines MD   3 mL at 11/20/20 2359      Review of Systems: 10 systems reviewed and negative except per interval history/subjective  Physical Exam: Vitals:   11/22/20 0812 11/22/20 0846  BP:    Pulse:     Resp:    Temp: (!) 96.2 F (35.7 C)   SpO2:  93%   Total I/O In: 80.9 [I.V.:80.9] Out: 30 [Urine:30]  Intake/Output Summary (Last 24 hours) at 11/22/2020 0920 Last data filed at 11/22/2020 0800 Gross per 24 hour  Intake 2473.45 ml  Output 5230 ml  Net -2756.55 ml   Constitutional: well-appearing, no acute distress ENMT: ears and nose without scars or lesions, MMM CV: normal rate, 2+ pitting edema in the bilateral lower extremities Respiratory: Minimal crackles present bilaterally, normal work of breathing Gastrointestinal: soft, non-tender, no palpable masses or hernias Skin: no visible lesions or rashes, warm lower extremities Psych: alert, judgement/insight appropriate, appropriate mood and affect   Test Results  I personally reviewed new and old clinical labs and radiology tests Lab Results  Component Value Date   NA 131 (L) 11/22/2020   K 2.8 (L) 11/22/2020   CL 91 (L) 11/22/2020   CO2 27 11/22/2020   BUN 92 (H) 11/22/2020   CREATININE 3.82 (H) 11/22/2020   CALCIUM 9.3 11/22/2020   ALBUMIN 2.8 (L) 11/22/2020   Recent Results (from the past 2160 hour(s))  Basic metabolic panel     Status: Abnormal   Collection Time: 11/20/20  3:18 AM  Result Value Ref Range   Sodium 137 135 - 145 mmol/L   Potassium 3.3 (L) 3.5 - 5.1 mmol/L   Chloride 96 (L) 98 - 111 mmol/L   CO2 27 22 - 32 mmol/L   Glucose, Bld 121 (H) 70 - 99 mg/dL    Comment: Glucose reference range applies only to samples taken after fasting for at least 8 hours.   BUN 94 (H) 8 - 23 mg/dL   Creatinine, Ser 3.73 (H) 0.44 - 1.00 mg/dL   Calcium 9.2 8.9 - 10.3 mg/dL   GFR, Estimated 13 (L) >60 mL/min    Comment: (NOTE) Calculated using the CKD-EPI Creatinine Equation (2021)    Anion gap 14 5 - 15    Comment: Performed at [facility], [address] [place], [place] [address]  CBC with Differential/Platelet     Status: Abnormal   Collection Time: 11/20/20  3:18 AM  Result Value Ref Range   WBC  6.5 4.0 - 10.5 K/uL   RBC 4.51 3.87 - 5.11 MIL/uL   Hemoglobin 14.4 12.0 - 15.0 g/dL   HCT 43.1 36.0 - 46.0 %   MCV 95.6 80.0 - 100.0 fL   MCH 31.9 26.0 - 34.0 pg   MCHC 33.4 30.0 - 36.0 g/dL   RDW 13.6 11.5 - 15.5 %   Platelets 277 150 - 400 K/uL   nRBC 0.9 (H) 0.0 - 0.2 %   Neutrophils Relative % 76 %   Neutro Abs 4.8 1.7 - 7.7 K/uL   Lymphocytes Relative 16 %   Lymphs Abs 1.1 0.7 - 4.0 K/uL   Monocytes Relative 7 %   Monocytes Absolute 0.5 0.1 - 1.0 K/uL   Eosinophils Relative 1 %   Eosinophils Absolute 0.0 0.0 - 0.5 K/uL   Basophils Relative 0 %   Basophils Absolute 0.0 0.0 - 0.1 K/uL   Immature Granulocytes 0 %   Abs Immature Granulocytes 0.02 0.00 - 0.07 K/uL    Comment: Performed at [facility], [address] [place], [place] [address]  Brain natriuretic peptide     Status: Abnormal   Collection Time: 11/20/20  3:18 AM  Result Value Ref Range   B Natriuretic Peptide 2,051.3 (H) 0.0 - 100.0 pg/mL    Comment: Performed at [facility], [address] [place], [place] [address]  Resp Panel by RT-PCR (Flu A&B, Covid) Nasopharyngeal Swab     Status: None   Collection Time: 11/20/20  3:24 AM   Specimen: Nasopharyngeal Swab; Nasopharyngeal(NP) swabs in vial transport medium  Result Value Ref Range   SARS Coronavirus 2 by RT PCR NEGATIVE NEGATIVE    Comment: (NOTE) SARS-CoV-2 target nucleic acids are NOT DETECTED.  The SARS-CoV-2 RNA is generally detectable in upper respiratory specimens during the acute phase of infection. The lowest concentration of SARS-CoV-2 viral copies this assay can detect is 138 copies/mL. A negative result does not preclude SARS-Cov-2 infection  and should not be used as the sole basis for treatment or other patient management decisions. A negative result may occur with  improper specimen collection/handling, submission of specimen other than nasopharyngeal swab, presence of viral mutation(s) within the areas targeted by  this assay, and inadequate number of viral copies(<138 copies/mL). A negative result must be combined with clinical observations, patient history, and epidemiological information. The expected result is Negative.  Fact Sheet for Patients:  EntrepreneurPulse.com.au  Fact Sheet for Healthcare Providers:  IncredibleEmployment.be  This test is no t yet approved or cleared by the [place] FDA and  has been authorized for detection and/or diagnosis of SARS-CoV-2 by FDA under an Emergency Use Authorization (EUA). This EUA will remain  in effect (meaning this test can be used) for the duration of the COVID-19 declaration under Section 564(b)(1) of the Act, 21 U.S.C.section 360bbb-3(b)(1), unless the authorization is terminated  or revoked sooner.       Influenza A by PCR NEGATIVE NEGATIVE   Influenza B by PCR NEGATIVE NEGATIVE    Comment: (NOTE) The Xpert Xpress SARS-CoV-2/FLU/RSV plus assay is intended as an aid in the diagnosis of influenza from Nasopharyngeal swab specimens and should not be used as a sole basis for treatment. Nasal washings and aspirates are unacceptable for Xpert Xpress SARS-CoV-2/FLU/RSV testing.  Fact Sheet for Patients: EntrepreneurPulse.com.au  Fact Sheet for Healthcare Providers: IncredibleEmployment.be  This test is not yet approved or cleared by the [place] FDA and has been authorized for detection and/or diagnosis of SARS-CoV-2 by FDA under an Emergency Use Authorization (EUA). This EUA will remain in effect (meaning this test can be used) for the duration of the COVID-19 declaration under Section 564(b)(1) of the Act, 21 U.S.C. section 360bbb-3(b)(1), unless the authorization is terminated or revoked.  Performed at [facility], [address] [place], [place] [address]   Troponin I (High Sensitivity)     Status: Abnormal   Collection Time: 11/20/20  3:35  AM  Result Value Ref Range   Troponin I (High Sensitivity) 38 (H) <18 ng/L    Comment: (NOTE) Elevated high sensitivity troponin I (hsTnI) values and significant  changes across serial measurements may suggest ACS but many other  chronic and acute conditions are known to elevate hsTnI results.  Refer to the "Links" section for chest pain algorithms and additional  guidance. Performed at [facility], [address] [place], [place] [address]   Troponin I (High Sensitivity)     Status: Abnormal   Collection Time: 11/20/20  6:00 AM  Result Value Ref Range   Troponin I (High Sensitivity) 35 (H) <18 ng/L    Comment: (NOTE) Elevated high sensitivity troponin I (hsTnI) values and significant  changes across serial measurements may suggest ACS but many other  chronic and acute conditions are known to elevate hsTnI results.  Refer to the "Links" section for chest pain algorithms and additional  guidance. Performed at [facility], [address] [place], [place] [address]   Glucose, capillary     Status: Abnormal   Collection Time: 11/20/20  6:46 PM  Result Value Ref Range   Glucose-Capillary 145 (H) 70 - 99 mg/dL    Comment: Glucose reference range applies only to samples taken after fasting for at least 8 hours.  Lactic acid, plasma     Status: None   Collection Time: 11/20/20  9:40 PM  Result Value Ref Range   Lactic Acid, Venous 1.6  0.5 - 1.9 mmol/L    Comment: Performed at [facility], [place] [address] [place], [place] [address]  TSH     Status: Abnormal   Collection Time: 11/20/20  9:40 PM  Result Value Ref Range   TSH 8.239 (H) 0.350 - 4.500 uIU/mL    Comment: Performed by a 3rd Generation assay with a functional sensitivity of <=0.01 uIU/mL. Performed at [facility], [place] [address], [place], [place] [address]   CBC     Status: Abnormal   Collection Time: 11/20/20  9:41 PM  Result Value Ref Range   WBC 6.2 4.0 - 10.5 K/uL   RBC 4.23  3.87 - 5.11 MIL/uL   Hemoglobin 13.6 12.0 - 15.0 g/dL   HCT 40.2 36.0 - 46.0 %   MCV 95.0 80.0 - 100.0 fL   MCH 32.2 26.0 - 34.0 pg   MCHC 33.8 30.0 - 36.0 g/dL   RDW 13.6 11.5 - 15.5 %   Platelets 261 150 - 400 K/uL   nRBC 1.0 (H) 0.0 - 0.2 %    Comment: Performed at [facility] [address] [place], [place] [address]  Comprehensive metabolic panel     Status: Abnormal   Collection Time: 11/20/20  9:41 PM  Result Value Ref Range   Sodium 134 (L) 135 - 145 mmol/L   Potassium 3.1 (L) 3.5 - 5.1 mmol/L   Chloride 98 98 - 111 mmol/L   CO2 22 22 - 32 mmol/L   Glucose, Bld 150 (H) 70 - 99 mg/dL    Comment: Glucose reference range applies only to samples taken after fasting for at least 8 hours.   BUN 98 (H) 8 - 23 mg/dL   Creatinine, Ser 3.90 (H) 0.44 - 1.00 mg/dL   Calcium 9.1 8.9 - 10.3 mg/dL   Total Protein 5.6 (L) 6.5 - 8.1 g/dL   Albumin 2.8 (L) 3.5 - 5.0 g/dL   AST 199 (H) 15 - 41 U/L   ALT 297 (H) 0 - 44 U/L   Alkaline Phosphatase 76 38 - 126 U/L   Total Bilirubin 1.2 0.3 - 1.2 mg/dL   GFR, Estimated 12 (L) >60 mL/min    Comment: (NOTE) Calculated using the CKD-EPI Creatinine Equation (2021)    Anion gap 14 5 - 15    Comment: Performed at [facility] [address] [place], [place] [address]  Bilirubin, direct     Status: None   Collection Time: 11/20/20  9:41 PM  Result Value Ref Range   Bilirubin, Direct 0.2 0.0 - 0.2 mg/dL    Comment: Performed at [facility] [address], [place], [place] [address]  Uric acid     Status: Abnormal   Collection Time: 11/20/20  9:41 PM  Result Value Ref Range   Uric Acid, Serum 10.3 (H) 2.5 - 7.1 mg/dL    Comment: Performed at [facility] [address], [place], [place] [address]  Magnesium     Status: None   Collection Time: 11/20/20  9:41 PM  Result Value Ref Range   Magnesium 2.0 1.7 - 2.4 mg/dL    Comment: Performed at [facility], [place] [address], [place], [place] [address]  I-STAT  7, (LYTES, BLD GAS, ICA, H+H)     Status: Abnormal   Collection Time: 11/20/20  9:43 PM  Result Value Ref Range   pH, Arterial 7.487 (H) 7.350 - 7.450   pCO2  arterial 30.6 (L) 32.0 - 48.0 mmHg   pO2, Arterial 95 83.0 - 108.0 mmHg   Bicarbonate 23.3 20.0 - 28.0 mmol/L   TCO2 24 22 - 32 mmol/L   O2 Saturation 98.0 %   Acid-Base Excess 0.0 0.0 - 2.0 mmol/L   Sodium 135 135 - 145 mmol/L   Potassium 3.1 (L) 3.5 - 5.1 mmol/L   Calcium, Ion 1.13 (L) 1.15 - 1.40 mmol/L   HCT 40.0 36.0 - 46.0 %   Hemoglobin 13.6 12.0 - 15.0 g/dL   Patient temperature 97.6 F    Sample type ARTERIAL   Glucose, capillary     Status: Abnormal   Collection Time: 11/20/20 11:58 PM  Result Value Ref Range   Glucose-Capillary 119 (H) 70 - 99 mg/dL    Comment: Glucose reference range applies only to samples taken after fasting for at least 8 hours.  HIV Antibody (routine testing w rflx)     Status: None   Collection Time: 11/21/20  2:58 AM  Result Value Ref Range   HIV Screen 4th Generation wRfx Non Reactive Non Reactive    Comment: Performed at [facility], [place] [address] [place], [place] [address]  Basic metabolic panel     Status: Abnormal   Collection Time: 11/21/20  2:58 AM  Result Value Ref Range   Sodium 132 (L) 135 - 145 mmol/L   Potassium 4.6 3.5 - 5.1 mmol/L   Chloride 97 (L) 98 - 111 mmol/L   CO2 20 (L) 22 - 32 mmol/L   Glucose, Bld 128 (H) 70 - 99 mg/dL    Comment: Glucose reference range applies only to samples taken after fasting for at least 8 hours.   BUN 99 (H) 8 - 23 mg/dL   Creatinine, Ser 3.95 (H) 0.44 - 1.00 mg/dL   Calcium 9.3 8.9 - 10.3 mg/dL   GFR, Estimated 12 (L) >60 mL/min    Comment: (NOTE) Calculated using the CKD-EPI Creatinine Equation (2021)    Anion gap 15 5 - 15    Comment: Performed at [facility] [address] [place], [place] [address]  Brain natriuretic peptide     Status: Abnormal   Collection Time: 11/21/20  2:58 AM  Result Value Ref Range   B  Natriuretic Peptide 2,022.4 (H) 0.0 - 100.0 pg/mL    Comment: Performed at [facility] [address], [place], [place] [address]  CBC     Status: Abnormal   Collection Time: 11/21/20  2:58 AM  Result Value Ref Range   WBC 6.1 4.0 - 10.5 K/uL   RBC 4.37 3.87 - 5.11 MIL/uL   Hemoglobin 14.2 12.0 - 15.0 g/dL   HCT 41.9 36.0 - 46.0 %   MCV 95.9 80.0 - 100.0 fL   MCH 32.5 26.0 - 34.0 pg   MCHC 33.9 30.0 - 36.0 g/dL   RDW 13.7 11.5 - 15.5 %   Platelets 261 150 - 400 K/uL   nRBC 1.0 (H) 0.0 - 0.2 %    Comment: Performed at [facility], [place] [address], [place], [place] [address]  Magnesium     Status: None   Collection Time: 11/21/20  2:58 AM  Result Value Ref Range   Magnesium 2.1 1.7 - 2.4 mg/dL    Comment: Performed at [facility], [place] [address] [place],  [address]  Lactic acid, plasma     Status: None   Collection Time: 11/21/20  2:58  AM  Result Value Ref Range   Lactic Acid, Venous 1.5 0.5 - 1.9 mmol/L    Comment: Performed at [facility] [address], [place], [place] [address]  Protime-INR     Status: Abnormal   Collection Time: 11/21/20  2:58 AM  Result Value Ref Range   Prothrombin Time 28.4 (H) 11.4 - 15.2 seconds   INR 2.7 (H) 0.8 - 1.2    Comment: (NOTE) INR goal varies based on device and disease states. Performed at [facility], [place] [address] [place], [place] [address]   Basic metabolic panel     Status: Abnormal   Collection Time: 11/21/20  5:50 AM  Result Value Ref Range   Sodium 136 135 - 145 mmol/L   Potassium 4.4 3.5 - 5.1 mmol/L   Chloride 97 (L) 98 - 111 mmol/L   CO2 25 22 - 32 mmol/L   Glucose, Bld 161 (H) 70 - 99 mg/dL    Comment: Glucose reference range applies only to samples taken after fasting for at least 8 hours.   BUN 99 (H) 8 - 23 mg/dL   Creatinine, Ser 4.21 (H) 0.44 - 1.00 mg/dL   Calcium 9.2 8.9 - 10.3 mg/dL   GFR, Estimated 11 (L) >60 mL/min    Comment: (NOTE) Calculated using the CKD-EPI  Creatinine Equation (2021)    Anion gap 14 5 - 15    Comment: Performed at [facility] [address] [place], [place] [address]  .Cooxemetry Panel (carboxy, met, total hgb, O2 sat)     Status: None   Collection Time: 11/21/20  5:50 AM  Result Value Ref Range   Total hemoglobin 13.9 12.0 - 16.0 g/dL   O2 Saturation 42.7 %   Carboxyhemoglobin 0.7 0.5 - 1.5 %   Methemoglobin 0.9 0.0 - 1.5 %    Comment: Performed at [facility], [place] [address], [place], [place] [address]  Cooxemetry Panel (carboxy, met, total hgb, O2 sat)     Status: None   Collection Time: 11/21/20  7:45 AM  Result Value Ref Range   Total hemoglobin 13.8 12.0 - 16.0 g/dL   O2 Saturation 54.5 %   Carboxyhemoglobin 0.9 0.5 - 1.5 %   Methemoglobin 0.9 0.0 - 1.5 %    Comment: Performed at [facility], [place] [address], [place], [place] [address]  Lactic acid, plasma     Status: None   Collection Time: 11/21/20  8:03 AM  Result Value Ref Range   Lactic Acid, Venous 1.2 0.5 - 1.9 mmol/L    Comment: Performed at [facility] [address], [place], [place] [address]  MRSA Next Gen by PCR, Nasal     Status: None   Collection Time: 11/21/20  8:23 AM   Specimen: Nasal Mucosa; Nasal Swab  Result Value Ref Range   MRSA by PCR Next Gen NOT DETECTED NOT DETECTED    Comment: (NOTE) The GeneXpert MRSA Assay (FDA approved for NASAL specimens only), is one component of a comprehensive MRSA colonization surveillance program. It is not intended to diagnose MRSA infection nor to guide or monitor treatment for MRSA infections. Test performance is not FDA approved in patients less than 93 years old. Performed at [facility], [place] [address], [place],  [address]   T3, free     Status: None   Collection Time: 11/21/20  8:26 AM  Result Value Ref Range   T3, Free  2.6 2.0 - 4.4 pg/mL    Comment: (NOTE) Performed At: [facility] [place], [place] [id number] Rush Farmer MD [id number]   T4, free     Status: Abnormal   Collection Time: 11/21/20  8:26 AM  Result Value Ref Range   Free T4 2.03 (H) 0.61 - 1.12 ng/dL    Comment: (NOTE) Biotin ingestion may interfere with free T4 tests. If the results are inconsistent with the TSH level, previous test results, or the clinical presentation, then consider biotin interference. If needed, order repeat testing after stopping biotin. Performed at [facility], [place] [address], [place], [place] [address]   ECHOCARDIOGRAM COMPLETE     Status: None   Collection Time: 11/21/20  8:30 AM  Result Value Ref Range   Weight 4,457.6 oz   Height 63.5 in   BP 83/64 mmHg   Single Plane A2C EF 33.2 %   Single Plane A4C EF 29.9 %   Calc EF 30.5 %   S' Lateral 3.30 cm   Area-P 1/2 6.02 cm2   Radius 0.50 cm   MV M vel 4.33 m/s   MV Peak grad 75.0 mmHg  C-reactive protein     Status: Abnormal   Collection Time: 11/21/20 12:05 PM  Result Value Ref Range   CRP 1.4 (H) <1.0 mg/dL    Comment: Performed at [facility] [address] [place], [place] [address]  C3 complement     Status: None   Collection Time: 11/21/20 12:05 PM  Result Value Ref Range   C3 Complement 109 82 - 167 mg/dL    Comment: (NOTE) Performed At: [facility] [place], [place] [id number] Rush Farmer MD [id number]   C4 complement     Status: None   Collection Time: 11/21/20 12:05 PM  Result Value Ref Range   Complement C4, Body Fluid 25 12 - 38 mg/dL    Comment: (NOTE) Performed At: [facility] [place], [place] [id number] Rush Farmer MD [id number]   Protein / creatinine ratio, urine     Status: None   Collection Time: 11/21/20  3:45 PM  Result Value Ref Range   Creatinine, Urine 30.78 mg/dL   Total Protein, Urine <3.0 mg/dL    Comment: NO NORMAL RANGE ESTABLISHED FOR THIS TEST   Protein Creatinine Ratio        0.00 - 0.15 mg/mg[Cre]    Comment: RESULT BELOW  REPORTABLE RANGE, UNABLE TO CALCULATE. Performed at [facility], [place] [address] [place], [place] [address]   Urinalysis, Routine w reflex microscopic Urine, Catheterized     Status: None   Collection Time: 11/21/20  3:46 PM  Result Value Ref Range   Color, Urine YELLOW YELLOW   APPearance CLEAR CLEAR   Specific Gravity, Urine 1.008 1.005 - 1.030   pH 5.0 5.0 - 8.0   Glucose, UA NEGATIVE NEGATIVE mg/dL   Hgb urine dipstick NEGATIVE NEGATIVE   Bilirubin Urine NEGATIVE NEGATIVE   Ketones, ur NEGATIVE NEGATIVE mg/dL   Protein, ur NEGATIVE NEGATIVE mg/dL   Nitrite NEGATIVE NEGATIVE   Leukocytes,Ua NEGATIVE NEGATIVE    Comment: Performed at [facility] [address] [place], [place] [address]  Cooxemetry Panel (carboxy, met, total hgb, O2 sat)     Status: None   Collection Time: 11/21/20  6:05 PM  Result Value Ref Range   Total hemoglobin 13.6 12.0 - 16.0 g/dL   O2 Saturation  71.6 %   Carboxyhemoglobin 1.1 0.5 - 1.5 %   Methemoglobin 0.8 0.0 - 1.5 %    Comment: Performed at [facility] [address], [place], [place] [address]  APTT     Status: Abnormal   Collection Time: 11/21/20  7:42 PM  Result Value Ref Range   aPTT 122 (H) 24 - 36 seconds    Comment:        IF BASELINE aPTT IS ELEVATED, SUGGEST PATIENT RISK ASSESSMENT BE USED TO DETERMINE APPROPRIATE ANTICOAGULANT THERAPY. Performed at [facility], [place] [address] [place], [place] [address]   Basic metabolic panel     Status: Abnormal   Collection Time: 11/21/20  7:42 PM  Result Value Ref Range   Sodium 132 (L) 135 - 145 mmol/L   Potassium 3.4 (L) 3.5 - 5.1 mmol/L   Chloride 95 (L) 98 - 111 mmol/L   CO2 24 22 - 32 mmol/L   Glucose, Bld 177 (H) 70 - 99 mg/dL    Comment: Glucose reference range applies only to samples taken after fasting for at least 8 hours.   BUN 98 (H) 8 - 23 mg/dL   Creatinine, Ser 3.92 (H) 0.44 - 1.00 mg/dL   Calcium 9.3 8.9 - 10.3 mg/dL   GFR, Estimated 12 (L) >60  mL/min    Comment: (NOTE) Calculated using the CKD-EPI Creatinine Equation (2021)    Anion gap 13 5 - 15    Comment: Performed at [facility] [address], [place], [place] [address]  APTT     Status: Abnormal   Collection Time: 11/21/20  8:47 PM  Result Value Ref Range   aPTT 129 (H) 24 - 36 seconds    Comment:        IF BASELINE aPTT IS ELEVATED, SUGGEST PATIENT RISK ASSESSMENT BE USED TO DETERMINE APPROPRIATE ANTICOAGULANT THERAPY. Performed at [facility], [place] [address] [place], [place] [address]   Cooxemetry Panel (carboxy, met, total hgb, O2 sat)     Status: None   Collection Time: 11/22/20  4:47 AM  Result Value Ref Range   Total hemoglobin 12.9 12.0 - 16.0 g/dL   O2 Saturation 71.4 %   Carboxyhemoglobin 1.1 0.5 - 1.5 %   Methemoglobin 1.0 0.0 - 1.5 %    Comment: Performed at [facility] [address] [place], [place] [address]  CBC     Status: Abnormal   Collection Time: 11/22/20  4:47 AM  Result Value Ref Range   WBC 7.5 4.0 - 10.5 K/uL   RBC 4.03 3.87 - 5.11 MIL/uL   Hemoglobin 12.8 12.0 - 15.0 g/dL   HCT 38.0 36.0 - 46.0 %   MCV 94.3 80.0 - 100.0 fL   MCH 31.8 26.0 - 34.0 pg   MCHC 33.7 30.0 - 36.0 g/dL   RDW 13.2 11.5 - 15.5 %   Platelets 256 150 - 400 K/uL   nRBC 0.4 (H) 0.0 - 0.2 %    Comment: Performed at [facility], [place] [address], [place], [place] [address]  Comprehensive metabolic panel     Status: Abnormal   Collection Time: 11/22/20  4:47 AM  Result Value Ref Range   Sodium 131 (L) 135 - 145 mmol/L   Potassium 2.8 (L) 3.5 - 5.1 mmol/L   Chloride 91 (L) 98 - 111 mmol/L   CO2 27 22 - 32 mmol/L   Glucose, Bld 144 (H) 70 - 99  mg/dL    Comment: Glucose reference range applies only to samples taken after fasting for at least 8 hours.   BUN 92 (H) 8 - 23 mg/dL   Creatinine, Ser 3.82 (H) 0.44 - 1.00 mg/dL    Comment: DELTA CHECK NOTED   Calcium 9.3 8.9 - 10.3 mg/dL   Total Protein 5.5 (L) 6.5 - 8.1 g/dL   Albumin  2.8 (L) 3.5 - 5.0 g/dL   AST 216 (H) 15 - 41 U/L   ALT 513 (H) 0 - 44 U/L   Alkaline Phosphatase 71 38 - 126 U/L   Total Bilirubin 1.2 0.3 - 1.2 mg/dL   GFR, Estimated 13 (L) >60 mL/min    Comment: (NOTE) Calculated using the CKD-EPI Creatinine Equation (2021)    Anion gap 13 5 - 15    Comment: Performed at [facility] [address], [place], [place] [address]  Heparin level (unfractionated)     Status: Abnormal   Collection Time: 11/22/20  4:47 AM  Result Value Ref Range   Heparin Unfractionated >1.10 (H) 0.30 - 0.70 IU/mL    Comment: (NOTE) The clinical reportable range upper limit is being lowered to >1.10 to align with the FDA approved guidance for the current laboratory assay.  If heparin results are below expected values, and patient dosage has  been confirmed, suggest follow up testing of antithrombin III levels. Performed at [facility], [place] [address] [place], [place] [address]   APTT     Status: Abnormal   Collection Time: 11/22/20  4:47 AM  Result Value Ref Range   aPTT 106 (H) 24 - 36 seconds    Comment:        IF BASELINE aPTT IS ELEVATED, SUGGEST PATIENT RISK ASSESSMENT BE USED TO DETERMINE APPROPRIATE ANTICOAGULANT THERAPY. Performed at [facility], [place] [address] [place], [place] [address]

## 2020-11-22 NOTE — Progress Notes (Signed)
ANTICOAGULATION CONSULT NOTE - Follow-Up Consult  Pharmacy Consult for Heparin Indication: atrial fibrillation  Patient Measurements: Height: 5' 3.5" (161.3 cm) Weight: 128.1 kg (282 lb 6.6 oz) IBW/kg (Calculated) : 53.55 Heparin Dosing Weight: 85 kg  Vital Signs: Temp: 97.6 F (36.4 C) (08/02 0400) Temp Source: Oral (08/02 0400) Pulse Rate: 113 (08/02 0500)  Labs: Recent Labs    11/20/20 0335 11/20/20 0600 11/20/20 2141 11/20/20 2143 11/21/20 0258 11/21/20 0550 11/21/20 1942 11/21/20 2047 11/22/20 0447  HGB  --   --  13.6 13.6 14.2  --   --   --  12.8  HCT  --   --  40.2 40.0 41.9  --   --   --  38.0  PLT  --   --  261  --  261  --   --   --  256  APTT  --   --   --   --   --   --  122* 129* 106*  LABPROT  --   --   --   --  28.4*  --   --   --   --   INR  --   --   --   --  2.7*  --   --   --   --   HEPARINUNFRC  --   --   --   --   --   --   --   --  >1.10*  CREATININE  --   --  3.90*  --  3.95* 4.21* 3.92*  --  3.82*  TROPONINIHS 38* 35*  --   --   --   --   --   --   --      Estimated Creatinine Clearance: 19.6 mL/min (A) (by C-G formula based on SCr of 3.82 mg/dL (H)).   Medical History: Past Medical History:  Diagnosis Date   A-fib ([place])    Cardiomyopathy    Hyperlipidemia    Hypertension    Lupus ([place])    Pulmonary hypertension ([place]) 05/10/2011   Echo, [id number]   Renal disorder    Sleep apnea 2008   CPAP, pt does not know settings   Thyroid disease     Medications:   calcitRIOL  0.25 mcg Oral Daily   Chlorhexidine Gluconate Cloth  6 each Topical Daily   clotrimazole  1 application Topical BID   estradiol  1 mg Oral Daily   febuxostat  40 mg Oral q AM   ferrous sulfate  325 mg Oral q morning   fluticasone  2 spray Each Nare Daily   fluticasone furoate-vilanterol  1 puff Inhalation Daily   gabapentin  100 mg Oral TID   levothyroxine  75 mcg Oral q morning   lidocaine  1 patch Transdermal Q24H   potassium chloride  40 mEq Oral Daily    potassium chloride  20 mEq Oral Once   predniSONE  5 mg Oral Q breakfast   sodium chloride flush  10-40 mL Intracatheter Q12H   sodium chloride flush  3 mL Intravenous Q12H     Assessment: 65 y.o. female with  h/o Afib, DOAC on hold, for heparin.  Pt was on Xarelto PTA and changed to Eliquis due to ARF.  Eliquis 5 mg last given at 2317 7/31.   aPTT remains slightly above goal at 106 seconds, heparin level still altered by DOAC. CBC stable.   Goal of Therapy:  Heparin level 0.3-0.7 units/ml aPTT 66-102 seconds Monitor  platelets by anticoagulation protocol: Yes   Plan:  -Reduce heparin to 1000 units/h -Recheck aPTT and heparin level with daily labs   Arrie Senate, PharmD, BCPS, [facility] Clinical Pharmacist [phone] Please check AMION for all [place] numbers 11/22/2020

## 2020-11-22 NOTE — Procedures (Addendum)
Radial ARTERIAL LINE INSERTION  Date: 11/20/20 Time: 2300 Operator: Rodman Key A. Renella Cunas, MD  Procedure Indication  Hypotension/pressors  Consent Benefits, risks, and possible complications of the procedure were explained to the patient, who verbalized understanding and gave consent.  Anesthesia Local lido 3 cc  Description of Procedure A time-out was completed verifying correct patient, procedure, site, positioning, and special equipment if applicable. The patient's left radial artery was prepped and draped in sterile fashion. 1% Lidocaine was used to anesthetize the area. An Arrow arterial line was introduced into the left radial artery. The catheter was threaded over the guide wire and the needle was removed with appropriate pulsatile blood return. The catheter was then sutured in place to the skin and a sterile dressing applied.   Complications  The patient was stable throughout the procedure. There were no complications. R radial access was first attempted however slow pulsatile blood was observed from the catheter despite smooth wire introduction, removed and pressure held.   Blood Loss 2 cc  Dion Body, MD

## 2020-11-23 DIAGNOSIS — R570 Cardiogenic shock: Secondary | ICD-10-CM | POA: Diagnosis not present

## 2020-11-23 DIAGNOSIS — I4891 Unspecified atrial fibrillation: Secondary | ICD-10-CM | POA: Diagnosis not present

## 2020-11-23 LAB — APTT: aPTT: 64 seconds — ABNORMAL HIGH (ref 24–36)

## 2020-11-23 LAB — CBC
HCT: 37.9 % (ref 36.0–46.0)
Hemoglobin: 12.8 g/dL (ref 12.0–15.0)
MCH: 31.7 pg (ref 26.0–34.0)
MCHC: 33.8 g/dL (ref 30.0–36.0)
MCV: 93.8 fL (ref 80.0–100.0)
Platelets: 253 10*3/uL (ref 150–400)
RBC: 4.04 MIL/uL (ref 3.87–5.11)
RDW: 13.4 % (ref 11.5–15.5)
WBC: 6.9 10*3/uL (ref 4.0–10.5)
nRBC: 0.7 % — ABNORMAL HIGH (ref 0.0–0.2)

## 2020-11-23 LAB — COMPREHENSIVE METABOLIC PANEL
ALT: 359 U/L — ABNORMAL HIGH (ref 0–44)
AST: 74 U/L — ABNORMAL HIGH (ref 15–41)
Albumin: 3 g/dL — ABNORMAL LOW (ref 3.5–5.0)
Alkaline Phosphatase: 71 U/L (ref 38–126)
Anion gap: 15 (ref 5–15)
BUN: 86 mg/dL — ABNORMAL HIGH (ref 8–23)
CO2: 29 mmol/L (ref 22–32)
Calcium: 9.8 mg/dL (ref 8.9–10.3)
Chloride: 87 mmol/L — ABNORMAL LOW (ref 98–111)
Creatinine, Ser: 3.53 mg/dL — ABNORMAL HIGH (ref 0.44–1.00)
GFR, Estimated: 14 mL/min — ABNORMAL LOW (ref >60)
Glucose, Bld: 142 mg/dL — ABNORMAL HIGH (ref 70–99)
Potassium: 3.6 mmol/L (ref 3.5–5.1)
Sodium: 131 mmol/L — ABNORMAL LOW (ref 135–145)
Total Bilirubin: 1.2 mg/dL (ref 0.3–1.2)
Total Protein: 5.9 g/dL — ABNORMAL LOW (ref 6.5–8.1)

## 2020-11-23 LAB — RENAL FUNCTION PANEL
Albumin: 3 g/dL — ABNORMAL LOW (ref 3.5–5.0)
Anion gap: 13 (ref 5–15)
BUN: 88 mg/dL — ABNORMAL HIGH (ref 8–23)
CO2: 30 mmol/L (ref 22–32)
Calcium: 9.4 mg/dL (ref 8.9–10.3)
Chloride: 88 mmol/L — ABNORMAL LOW (ref 98–111)
Creatinine, Ser: 3.81 mg/dL — ABNORMAL HIGH (ref 0.44–1.00)
GFR, Estimated: 13 mL/min — ABNORMAL LOW (ref >60)
Glucose, Bld: 158 mg/dL — ABNORMAL HIGH (ref 70–99)
Phosphorus: 2.8 mg/dL (ref 2.5–4.6)
Potassium: 4.4 mmol/L (ref 3.5–5.1)
Sodium: 131 mmol/L — ABNORMAL LOW (ref 135–145)

## 2020-11-23 LAB — COOXEMETRY PANEL
Carboxyhemoglobin: 1.4 % (ref 0.5–1.5)
Carboxyhemoglobin: 1.1 % (ref 0.5–1.5)
Methemoglobin: 0.9 % (ref 0.0–1.5)
Methemoglobin: 0.9 % (ref 0.0–1.5)
O2 Saturation: 68.3 %
O2 Saturation: 47.7 %
Total hemoglobin: 13.1 g/dL (ref 12.0–16.0)
Total hemoglobin: 13.2 g/dL (ref 12.0–16.0)

## 2020-11-23 LAB — GLUCOSE, CAPILLARY: Glucose-Capillary: 142 mg/dL — ABNORMAL HIGH (ref 70–99)

## 2020-11-23 LAB — MAGNESIUM: Magnesium: 2.7 mg/dL — ABNORMAL HIGH (ref 1.7–2.4)

## 2020-11-23 LAB — HEPARIN LEVEL (UNFRACTIONATED): Heparin Unfractionated: >1.1 IU/mL — ABNORMAL HIGH (ref 0.30–0.70)

## 2020-11-23 MED ORDER — POTASSIUM CHLORIDE CRYS ER 20 MEQ PO TBCR
40.0000 meq | EXTENDED_RELEASE_TABLET | Freq: Every day | ORAL | Status: DC
  Administered 2020-11-23 – 2020-11-29 (×7): 40 meq via ORAL
  Filled 2020-11-23 (×7): qty 2

## 2020-11-23 MED ORDER — AMIODARONE LOAD VIA INFUSION
150.0000 mg | Freq: Once | INTRAVENOUS | Status: AC
  Administered 2020-11-23: 150 mg via INTRAVENOUS
  Filled 2020-11-23: qty 83.34

## 2020-11-23 MED ORDER — POTASSIUM CHLORIDE CRYS ER 20 MEQ PO TBCR
40.0000 meq | EXTENDED_RELEASE_TABLET | Freq: Once | ORAL | Status: AC
  Administered 2020-11-23: 40 meq via ORAL
  Filled 2020-11-23: qty 2

## 2020-11-23 NOTE — Progress Notes (Signed)
Orthopedic Tech Progress Note Patient Details:  Caitlyn Fuentes [birth year] [id number]  Ortho Devices Type of Ortho Device: Haematologist Ortho Device/Splint Location: Bi LE Ortho Device/Splint Interventions: Application   Post Interventions Patient Tolerated: Well  Caitlyn Fuentes 11/23/2020, 3:08 PM

## 2020-11-23 NOTE — Progress Notes (Addendum)
Advanced Heart Failure Rounding Note  PCP-Cardiologist: Shelva Majestic, MD   Subjective:   8/1 A fib RVR & A/C systolic heart faiure --> cardiogenic shock. Had urgent DC-CV with brief conversion to NSR but went back in A fib. . On Norepi + milrinone. AKI. Nephrology consulted.  8/2 A fib RVR given amio bolus x2 and continued on amio drip 60 mg per hour. Continue to diurese with lasix drip. Negative 1 liter. Continued on milrinone 0.375 mcg + norepi 2 mcg. Started on prednisone 40 mg daily x3 days for gout flare.   Off norepi. Remains on Milrinone 0.375 mcg+ amio 60 + lasix drip 20. CO-OX 68%  Creatinine trending down. Peaked 3.9-->3.5   Feeling better. Denies SOB.     Objective:   Weight Range: 128 kg Body mass index is 49.2 kg/m.   Vital Signs:   Temp:  [96.2 F (35.7 C)-98.4 F (36.9 C)] 97.6 F (36.4 C) (08/02 2300) Pulse Rate:  [54-141] 121 (08/03 0700) Resp:  [15-25] 16 (08/03 0700) BP: (95-125)/(69-94) 125/86 (08/02 1600) SpO2:  [87 %-97 %] 95 % (08/03 0700) Arterial Line BP: (97-134)/(55-78) 134/78 (08/03 0700) Weight:  [709 kg] 128 kg (08/03 0500) Last BM Date:  (PTA)  Weight change: Filed Weights   11/20/20 0355 11/22/20 0500 11/23/20 0500  Weight: 126.4 kg 128.1 kg 128 kg    Intake/Output:   Intake/Output Summary (Last 24 hours) at 11/23/2020 0708 Last data filed at 11/23/2020 0700 Gross per 24 hour  Intake 2989.34 ml  Output 4005 ml  Net -1015.66 ml     CVP 10-11 Physical Exam   General:  . No resp difficulty HEENT: normal Neck: supple. JVP difficult to assess due to body habitus. Carotids 2+ bilat; no bruits. No lymphadenopathy or thryomegaly appreciated. LIJ  Cor: PMI nondisplaced. Tachy Irregular rate & rhythm. No rubs, gallops or murmurs. Lungs: clear Abdomen: obese, soft, nontender, nondistended. No hepatosplenomegaly. No bruits or masses. Good bowel sounds. Extremities: warm, no cyanosis, clubbing, rash, R and LLE 2+ edema Neuro: alert &  orientedx3, cranial nerves grossly intact. moves all 4 extremities w/o difficulty. Affect pleasant   Telemetry   A fib RVR 110-140s with brief NSVT  EKG    N/A  Labs    CBC Recent Labs    11/22/20 0447 11/23/20 0449  WBC 7.5 6.9  HGB 12.8 12.8  HCT 38.0 37.9  MCV 94.3 93.8  PLT 256 628   Basic Metabolic Panel Recent Labs    11/22/20 1018 11/22/20 1400 11/23/20 0449  NA 130* 129* 131*  K 3.3* 3.5 3.6  CL 89* 89* 87*  CO2 25 25 29   GLUCOSE 179* 201* 142*  BUN 92* 91* 86*  CREATININE 3.78* 3.82* 3.53*  CALCIUM 9.6 9.4 9.8  MG 1.7  --  2.7*  PHOS  --  2.9  --    Liver Function Tests Recent Labs    11/22/20 0447 11/22/20 1400 11/23/20 0449  AST 216*  --  74*  ALT 513*  --  359*  ALKPHOS 71  --  71  BILITOT 1.2  --  1.2  PROT 5.5*  --  5.9*  ALBUMIN 2.8* 2.9* 3.0*   No results for input(s): LIPASE, AMYLASE in the last 72 hours. Cardiac Enzymes No results for input(s): CKTOTAL, CKMB, CKMBINDEX, TROPONINI in the last 72 hours.  BNP: BNP (last 3 results) Recent Labs    11/20/20 0318 11/21/20 0258  BNP 2,051.3* 2,022.4*    ProBNP (last 3  results) No results for input(s): PROBNP in the last 8760 hours.   D-Dimer No results for input(s): DDIMER in the last 72 hours. Hemoglobin A1C No results for input(s): HGBA1C in the last 72 hours. Fasting Lipid Panel No results for input(s): CHOL, HDL, LDLCALC, TRIG, CHOLHDL, LDLDIRECT in the last 72 hours. Thyroid Function Tests Recent Labs    11/20/20 2140 11/21/20 0826  TSH 8.239*  --   T3FREE  --  2.6    Other results:   Imaging    No results found.   Medications:     Scheduled Medications:  calcitRIOL  0.25 mcg Oral Daily   Chlorhexidine Gluconate Cloth  6 each Topical Daily   clotrimazole  1 application Topical BID   estradiol  1 mg Oral Daily   febuxostat  40 mg Oral q AM   ferrous sulfate  325 mg Oral q morning   fluticasone  2 spray Each Nare Daily   fluticasone furoate-vilanterol   1 puff Inhalation Daily   gabapentin  100 mg Oral TID   levothyroxine  88 mcg Oral q morning   lidocaine  1 patch Transdermal Q24H   potassium chloride  40 mEq Oral Daily   predniSONE  30 mg Oral Q breakfast   [START ON 11/25/2020] predniSONE  5 mg Oral Q breakfast   sodium chloride flush  10-40 mL Intracatheter Q12H   sodium chloride flush  3 mL Intravenous Q12H    Infusions:  sodium chloride     amiodarone 60 mg/hr (11/23/20 0700)   furosemide (LASIX) 200 mg in dextrose 5% 100 mL (2mg /mL) infusion 20 mg/hr (11/23/20 0700)   heparin 1,000 Units/hr (11/23/20 0700)   milrinone 0.375 mcg/kg/min (11/23/20 0700)   norepinephrine (LEVOPHED) Adult infusion Stopped (11/23/20 0132)    PRN Medications: sodium chloride, albuterol, cyclobenzaprine, HYDROcodone-acetaminophen, ondansetron (ZOFRAN) IV, oxyCODONE, sodium chloride flush, sodium chloride flush    Patient Profile   Caitlyn Fuentes is a 65 year old with a history of chronic systolic hf previously EF 45%,  NICM, hypothyroid, PAF, gout, OSA, CKD Stage IV, HTN, hyperlipidemia, and lupus.   Admitted with A fib RVR and A/C systolic heart failure complicated by cardiogenic shock.      Assessment/Plan  Acute/Chronic Systolic Heart Failure Cardiogenic shock -8/1 Lactic acid 1.5. CO-OX 43%.--> milrinone increased 0.375 mcg + norepe 4 mcg. CO-OX repeated with improvement.  - Echo completed and showed EF has fallen from  30-35% in 2021  to 25-30%.  - Now off norepi. CO-OX 68% on  milrinone 0.375 mcg. Cut back milrinone 0.25 mcg.  - CVP coming down 20-->10-11 today. Volume status improving.  Continue lasix drip.  - Supp K.   -No room for GDMT for now.   2. PAF--> Afib RVR - Developed shock and had rurgent cardioversion this morning with conversion to NSR. - TSH elevated. Increase synthroid.   - Rate uncontrolled. Off norepi. Cut back milrinone a little to 0.25 mcg.   -GIve 150 mg bolus now. Continue amio drip 60 mg per hour.  -On heparin  drip, previously on eliquis. Last dose of eliquis 11/19/20 at 2315. - If remains in A fib with need TEE/DC-CV once diuresed.    3. AKI on CKD Stage IV -Recent Creatinine baseline 3.2 -> peaked at 4.2. Now 3.5 -Renal US with no acute findings. -Worsening renal function suspect in the setting of shocK -Making 4 liters urine.  - Nephrology following.    4. OSA Uses CPAP   5. Hypothyroidism -TSH  8. Check T3 ok and T4 2.3  -On synthroid will increase to 88 mcg.  - Repeat TSH in 6 weeks.    6. Gout -Uric  Acid 10. Pain RLE ? Flare  - On uloric 40 mg daily  - Day  2/3  40 mg prednisone daily x 3 days.   7. Hypokalemia  Supp K.   8. Deconditioning Consult PT /OT . HH ordered but may need . I wonder if may need SNF.     Length of Stay: 3  Amy Clegg, NP  11/23/2020, 7:08 AM  Advanced Heart Failure Team Pager [phone] (M-F; 7a - 5p)  Please contact [facility] for night-coverage after hours (5p -7a ) and weekends on [email or web address]   Agree with above.  Continues to diurese well on milrinone, NE and lasix gtt at 20. However weight seems inaccurate.   Now off NE and co-ox 68% on milrinone 0.375   Remains in AF with RVR 130-150s despite amio gtt and boluses.   Breathing better. Creatinine trending down toward recent baseline of 3.2  General:  Sitting up in bed No resp difficulty HEENT: normal Neck: supple. JVP 10-11. Carotids 2+ bilat; no bruits. No lymphadenopathy or thryomegaly appreciated. Cor: PMI nondisplaced. Irreg tachy No rubs, gallops or murmurs. Lungs: clear Abdomen: obese soft, nontender, nondistended. No hepatosplenomegaly. No bruits or masses. Good bowel sounds. Extremities: no cyanosis, clubbing, rash,  1+ edema Neuro: alert & orientedx3, cranial nerves grossly intact. moves all 4 extremities w/o difficulty. Affect pleasant  Ok to wean milrinone to 0.25 but would not wean too fast too support further diuresis and full recovery of renal function. Continue IV  amio. Will schedule TEE/DC-CV tomorrow with anesthesia support. Ideally would do in CCU if possible.   CRITICAL CARE Performed by: Glori Bickers  Total critical care time: 35 minutes  Critical care time was exclusive of separately billable procedures and treating other patients.  Critical care was necessary to treat or prevent imminent or life-threatening deterioration.  Critical care was time spent personally by me (independent of midlevel providers or residents) on the following activities: development of treatment plan with patient and/or surrogate as well as nursing, discussions with consultants, evaluation of patient's response to treatment, examination of patient, obtaining history from patient or surrogate, ordering and performing treatments and interventions, ordering and review of laboratory studies, ordering and review of radiographic studies, pulse oximetry and re-evaluation of patient's condition.  Glori Bickers, MD  8:15 AM

## 2020-11-23 NOTE — Evaluation (Signed)
Occupational Therapy Evaluation Patient Details Name: Caitlyn Fuentes MRN: [id number] DOB: Mar 23, 1956 Today's Date: 11/23/2020    History of Present Illness Pt is a 65 y/o female presenting on 11/20/20 with worsening palpitations, SOB and LE edema. Pt found in rapid a fib, chest x-ray with significant vascular congestion. 8/1 hypotensive with cardiogenic shock, transferred to ICU and urgent cardioversion.   PMH includes: afib, cardiomyopathy, HTN, lupus, PAF, CKD.   Clinical Impression   PTA patient independent with mobility and most ADLs. Admitted for above and limited by problem list below including afib, decreased activity tolerance, edema and generalized weakness. Pt currently requires up to max assist for LB ADLs, min guard for transfers to recliner.  Limited session due to afib with HR ranging from 130-150 during session, but cleared by MD for OOB to recliner. Pt will benefit from continued OT services while admitted and after dc at [facility] vs SNF level pending progress and further mobility/ADL engagement.  Will follow acutely.     Follow Up Recommendations  Home health OT;SNF;Other (comment);Supervision/Assistance - 24 hour (TBD pending progress)    Equipment Recommendations  Other (comment) (TBD)    Recommendations for Other Services PT consult     Precautions / Restrictions Precautions Precautions: Fall;Other (comment) Precaution Comments: watch HR Restrictions Weight Bearing Restrictions: No      Mobility Bed Mobility Overal bed mobility: Needs Assistance Bed Mobility: Supine to Sit     Supine to sit: Min guard     General bed mobility comments: for line mgmt and safety    Transfers Overall transfer level: Needs assistance   Transfers: Sit to/from Stand;Stand Pivot Transfers Sit to Stand: Min guard Stand pivot transfers: Min guard       General transfer comment: min guard for safety/balance, line mgmt    Balance Overall balance assessment: Mild deficits  observed, not formally tested                                         ADL either performed or assessed with clinical judgement   ADL Overall ADL's : Needs assistance/impaired     Grooming: Set up;Sitting           Upper Body Dressing : Minimal assistance;Sitting   Lower Body Dressing: Maximal assistance;Sit to/from stand Lower Body Dressing Details (indicate cue type and reason): able for socks, min guard sit to stand Toilet Transfer: Min Designer, jewellery Details (indicate cue type and reason): simulated to recliner         Functional mobility during ADLs: Min guard General ADL Comments: pt limited by afib, decreased activity tolerance, body habitus     Vision   Vision Assessment?: No apparent visual deficits     Perception     Praxis      Pertinent Vitals/Pain Pain Assessment: No/denies pain     Hand Dominance Right   Extremity/Trunk Assessment Upper Extremity Assessment Upper Extremity Assessment: Generalized weakness   Lower Extremity Assessment Lower Extremity Assessment: Defer to PT evaluation       Communication Communication Communication: No difficulties   Cognition Arousal/Alertness: Awake/alert Behavior During Therapy: WFL for tasks assessed/performed Overall Cognitive Status: Within Functional Limits for tasks assessed                                     General Comments  Pt in afib during session, cleared to transfer to recliner from MD and RN. BP 112/69, HR 130-150 and 94% SpO2 on RA    Exercises     Shoulder Instructions      Home Living Family/patient expects to be discharged to:: Private residence Living Arrangements: Spouse/significant other Available Help at Discharge: Family;Available 24 hours/day (spouse has dementia) Type of Home: House Home Access: Stairs to enter     Home Layout: Two level;Able to live on main level with bedroom/bathroom     Bathroom Shower/Tub:  Walk-in shower   Bathroom Toilet: Handicapped height     Home Equipment: Environmental consultant - 2 wheels;Walker - 4 wheels;Bedside commode;Shower seat;Grab bars - toilet;Wheelchair - manual          Prior Functioning/Environment Level of Independence: Needs assistance  Gait / Transfers Assistance Needed: reports completing mobility without AD in home, but furniture walking ADL's / Homemaking Assistance Needed: reports independent with ADLs            OT Problem List: Decreased strength;Decreased activity tolerance;Decreased knowledge of precautions;Decreased knowledge of use of DME or AE;Cardiopulmonary status limiting activity;Obesity;Increased edema      OT Treatment/Interventions: Self-care/ADL training;Therapeutic exercise;DME and/or AE instruction;Therapeutic activities;Patient/family education;Balance training;Energy conservation    OT Goals(Current goals can be found in the care plan section) Acute Rehab OT Goals Patient Stated Goal: to get better OT Goal Formulation: With patient Time For Goal Achievement: 12/07/20 Potential to Achieve Goals: Good  OT Frequency: Min 2X/week   Barriers to D/C:            Co-evaluation              AM-PAC OT "6 Clicks" Daily Activity     Outcome Measure Help from another person eating meals?: None Help from another person taking care of personal grooming?: A Little Help from another person toileting, which includes using toliet, bedpan, or urinal?: A Lot Help from another person bathing (including washing, rinsing, drying)?: A Lot Help from another person to put on and taking off regular upper body clothing?: A Little Help from another person to put on and taking off regular lower body clothing?: A Lot 6 Click Score: 16   End of Session Nurse Communication: Mobility status  Activity Tolerance: Patient tolerated treatment well Patient left: in chair;with call bell/phone within reach;with family/visitor present;with nursing/sitter in  room  OT Visit Diagnosis: Other abnormalities of gait and mobility (R26.89);Muscle weakness (generalized) (M62.81)                Time: 1610-9604 OT Time Calculation (min): 29 min Charges:  OT General Charges $OT Visit: 1 Visit OT Evaluation $OT Eval Moderate Complexity: 1 Mod OT Treatments $Self Care/Home Management : 8-22 mins  Jolaine Artist, OT Acute Rehabilitation Services Pager [phone] Office [place] 11/23/2020, 10:10 AM

## 2020-11-23 NOTE — TOC Progression Note (Signed)
Transition of Care [facility]) - Progression Note    Patient Details  Name: Caitlyn Fuentes MRN: [id number] Date of Birth: 10/28/1955  Transition of Care [facility]) CM/SW Contact  Graves-Bigelow, Ocie Cornfield, RN Phone Number: 11/23/2020, 12:26 PM  Clinical Narrative:  Case Manager received a call from the Staff RN that the family had questions regarding disposition plan of care. 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 Daughter Baxter Flattery, states that the patient will no longer be able to be his caregiver. We discussed SNF possibility and daughter had questions regarding the PACE Program. Case Manager provided the patient with verbal information and has asked the CSW to speak with the patient regarding PACE and SNF options.   Expected Discharge Plan: [place] Barriers to Discharge: Continued Medical Work up  Expected Discharge Plan and Services Expected Discharge Plan: [place] In-house Referral: Clinical Social Work Discharge Planning Services: CM Consult Post Acute Care Choice: [place] arrangements for the past 2 months: [place]                 DME Arranged: Walker rolling with seat DME Agency: [employer] Date DME Agency Contacted: 11/22/20 Time DME Agency Contacted: [phone] Representative spoke with at DME Agency: Melina Modena HH Arranged: RN, PT, OT [facility] Agency: [place] Date [place]: 11/22/20   Representative spoke with at [place]: Adela Lank   Readmission Risk Interventions No flowsheet data found.

## 2020-11-23 NOTE — Progress Notes (Signed)
ANTICOAGULATION CONSULT NOTE - Follow-Up Consult  Pharmacy Consult for Heparin Indication: atrial fibrillation  Patient Measurements: Height: 5' 3.5" (161.3 cm) Weight: 128 kg (282 lb 3 oz) IBW/kg (Calculated) : 53.55 Heparin Dosing Weight: 85 kg  Vital Signs: Temp: 97.6 F (36.4 C) (08/02 2300) Temp Source: Oral (08/02 2300) Pulse Rate: 121 (08/03 0700)  Labs: Recent Labs    11/21/20 0258 11/21/20 0550 11/21/20 2047 11/22/20 0447 11/22/20 1018 11/22/20 1400 11/23/20 0449  HGB 14.2  --   --  12.8  --   --  12.8  HCT 41.9  --   --  38.0  --   --  37.9  PLT 261  --   --  256  --   --  253  APTT  --    < > 129* 106*  --   --  64*  LABPROT 28.4*  --   --   --   --   --   --   INR 2.7*  --   --   --   --   --   --   HEPARINUNFRC  --   --   --  >1.10*  --   --  >1.10*  CREATININE 3.95*   < >  --  3.82* 3.78* 3.82* 3.53*   < > = values in this interval not displayed.     Estimated Creatinine Clearance: 21.2 mL/min (A) (by C-G formula based on SCr of 3.53 mg/dL (H)).   Medical History: Past Medical History:  Diagnosis Date   A-fib ([place])    Cardiomyopathy    Hyperlipidemia    Hypertension    Lupus ([place])    Pulmonary hypertension ([place]) 05/10/2011   Echo, EF-40-45   Renal disorder    Sleep apnea 2008   CPAP, pt does not know settings   Thyroid disease     Medications:   calcitRIOL  0.25 mcg Oral Daily   Chlorhexidine Gluconate Cloth  6 each Topical Daily   clotrimazole  1 application Topical BID   estradiol  1 mg Oral Daily   febuxostat  40 mg Oral q AM   ferrous sulfate  325 mg Oral q morning   fluticasone  2 spray Each Nare Daily   fluticasone furoate-vilanterol  1 puff Inhalation Daily   gabapentin  100 mg Oral TID   levothyroxine  88 mcg Oral q morning   lidocaine  1 patch Transdermal Q24H   potassium chloride  40 mEq Oral Daily   predniSONE  30 mg Oral Q breakfast   [START ON 11/25/2020] predniSONE  5 mg Oral Q breakfast   sodium chloride flush  10-40  mL Intracatheter Q12H   sodium chloride flush  3 mL Intravenous Q12H     Assessment: 65 y.o. female with  h/o Afib, DOAC on hold, for heparin.  Pt was on Xarelto PTA and changed to Eliquis due to ARF.  Eliquis 5 mg last given at 2317 7/31.   aPTT now slightly below goal at 64 seconds, heparin level still altered by DOAC. H/H and pltc stable,   Goal of Therapy:  Heparin level 0.3-0.7 units/ml aPTT 66-102 seconds Monitor platelets by anticoagulation protocol: Yes   Plan:  -Increase heparin to 1050 units/h -Recheck aPTT and heparin level with daily labs   Arrie Senate, PharmD, BCPS, [facility] Clinical Pharmacist [phone] Please check AMION for all [place] numbers 11/23/2020

## 2020-11-23 NOTE — Evaluation (Signed)
Physical Therapy Evaluation Patient Details Name: Caitlyn Fuentes MRN: [id number] DOB: [birth year] Today's Date: 11/23/2020   History of Present Illness  Pt is a 65 y/o female presenting on 11/20/20 with worsening palpitations, SOB and LE edema. Pt found in rapid a fib, chest x-ray with significant vascular congestion. 8/1 hypotensive with cardiogenic shock, transferred to ICU and urgent cardioversion.   PMH includes: afib, cardiomyopathy, HTN, lupus, PAF, CKD.   Clinical Impression  Pt admitted with/for afib with RVR.  Pt going for cardioversion, but presently limited by rapid HR/dyspnea.  Pt needed min guard for basic mobility and gait.Marland Kitchen  Pt currently limited functionally due to the problems listed below.  (see problems list.)  Pt will benefit from PT to maximize function and safety to be able to get home safely with available assist.  Pt/husband would both benefit from a program like PACE to help with caregiver burden and safety. .     Follow Up Recommendations Home health PT;Other (comment) (SNF if unable to get help at home for she/husband.  Pt/husband could be great candidates for PACE.)    Equipment Recommendations    NONE   Recommendations for Other Services Rehab consult     Precautions / Restrictions Precautions Precautions: Fall;Other (comment) Precaution Comments: watch HR Restrictions Weight Bearing Restrictions: No      Mobility  Bed Mobility Overal bed mobility: Needs Assistance Bed Mobility: Supine to Sit     Supine to sit: Min guard     General bed mobility comments: OOB on arrival    Transfers Overall transfer level: Needs assistance   Transfers: Sit to/from Stand;Stand Pivot Transfers Sit to Stand: Min guard Stand pivot transfers: Min guard       General transfer comment: min guard for safety/balance, line mgmt  Ambulation/Gait Ambulation/Gait assistance: Min guard Gait Distance (Feet): 90 Feet Assistive device: Rolling walker (2 wheeled)      Gait velocity interpretation: 1.31 - 2.62 ft/sec, indicative of limited community ambulator General Gait Details: generally steady with the RW, EHR 130's to 163 bpm, Sats low 90's overall,  dropping to 89% with dyspnea after fatigued for ambulation.  Stairs            Wheelchair Mobility    Modified Rankin (Stroke Patients Only)       Balance Overall balance assessment: Mild deficits observed, not formally tested                                           Pertinent Vitals/Pain Pain Assessment: No/denies pain    Home Living Family/patient expects to be discharged to:: Private residence Living Arrangements: Spouse/significant other Available Help at Discharge: Family;Available 24 hours/day (spouse has dementia) Type of Home: House Home Access: Stairs to enter     Home Layout: Two level;Able to live on main level with bedroom/bathroom Home Equipment: Gilford Rile - 2 wheels;Walker - 4 wheels;Bedside commode;Shower seat;Grab bars - toilet;Wheelchair - manual      Prior Function Level of Independence: Needs assistance   Gait / Transfers Assistance Needed: reports completing mobility without AD in home, but furniture walking  ADL's / Homemaking Assistance Needed: reports independent with ADLs        Hand Dominance   Dominant Hand: Right    Extremity/Trunk Assessment   Upper Extremity Assessment Upper Extremity Assessment: Generalized weakness    Lower Extremity Assessment Lower Extremity Assessment: Generalized weakness  Communication   Communication: No difficulties  Cognition Arousal/Alertness: Awake/alert Behavior During Therapy: WFL for tasks assessed/performed Overall Cognitive Status: Within Functional Limits for tasks assessed                                        General Comments General comments (skin integrity, edema, etc.): HR 130's to 150's, afib, SpO2 on RA generally low 90's with one drop to 89% on RA  with gait and progressing fatigue    Exercises     Assessment/Plan    PT Assessment Patient needs continued PT services  PT Problem List Decreased strength;Decreased activity tolerance;Decreased balance;Decreased mobility;Decreased knowledge of use of DME;Cardiopulmonary status limiting activity       PT Treatment Interventions DME instruction;Gait training;Stair training;Functional mobility training;Therapeutic activities;Patient/family education;Balance training    PT Goals (Current goals can be found in the Care Plan section)  Acute Rehab PT Goals Patient Stated Goal: to get better PT Goal Formulation: With patient Time For Goal Achievement: 12/07/20 Potential to Achieve Goals: Good    Frequency Min 3X/week   Barriers to discharge        Co-evaluation               AM-PAC PT "6 Clicks" Mobility  Outcome Measure Help needed turning from your back to your side while in a flat bed without using bedrails?: A Little Help needed moving from lying on your back to sitting on the side of a flat bed without using bedrails?: A Little Help needed moving to and from a bed to a chair (including a wheelchair)?: A Little Help needed standing up from a chair using your arms (e.g., wheelchair or bedside chair)?: A Little Help needed to walk in hospital room?: A Little Help needed climbing 3-5 steps with a railing? : A Little 6 Click Score: 18    End of Session   Activity Tolerance: Patient tolerated treatment well;Patient limited by fatigue Patient left: in chair;with call bell/phone within reach;with family/visitor present Nurse Communication: Other (comment) PT Visit Diagnosis: Unsteadiness on feet (R26.81);Other abnormalities of gait and mobility (R26.89);Difficulty in walking, not elsewhere classified (R26.2)    Time: 1610-9604 PT Time Calculation (min) (ACUTE ONLY): 31 min   Charges:   PT Evaluation $PT Eval Moderate Complexity: 1 Mod PT Treatments $Gait Training:  8-22 mins        11/23/2020  [name], PT Acute Rehabilitation Services [phone]  (pager) [phone]  (office)  [name] Nonna Renninger 11/23/2020, 1:22 PM

## 2020-11-23 NOTE — Progress Notes (Addendum)
Called by nursing staff for reduced urine output.   Earlier today lasix drip cut back to 20 mg per hour and milirinone cut back to 0.25 mcg.   Remains in A fib RVR on amio drip.   CVP 11-13. Continue current dose of lasix drip.    CO-OX repeated - down from 68%--->48%. Increase milrinone to 0.375 mcg.   Plan for TEE/DC-CV tomorrow at bedside.   Khristi Schiller NP-C  5:24 PM

## 2020-11-23 NOTE — TOC Progression Note (Addendum)
Transition of Care (TOC) - Progression Note  Heart Failure   Patient Details  Name: Caitlyn Fuentes MRN: [id number] Date of Birth: [birth year]  Transition of Care [facility]) CM/SW [place], [place] Phone Number: 11/23/2020, 12:40 PM  Clinical Narrative:    CSW spoke with the patient and her daughter, Caitlyn Fuentes at bedside and completed a very brief SDOH with the patient who reported that a Food Stamp referral would be helpful. CSW obtained [name]'s signature for the Food Stamp referral for the [facility] to reach out to them. CSW faxed over the Food Stamp application and informed the patient that the [facility] will be in touch. CSW spoke with [name] and her dauther about the [employer] and they reported wanting a referral to that program for her and her husband. CSW can refer the patient but not her husband at this time as he not a patient her in the hospital and CSW explained that to [name] and her daughter. CSW spoke about the patient's discharge plan and recommendations from PT and OT and waiting to see what they suggest and spoke briefly about the differences with SNF, HH PT/OT, and CIR. [name] and her daughter have a preference for CIR and if not CIR [name] states HH would be her preference due to her husband having dementia and being his caretaker. Awaiting PT/OT recommendation and patient's medical progression.  Referral faxed over to PACE. Family and patient requesting to see the chaplain for POA and notary. CSW notified the chaplain and they will be by tomorrow.  CSW will continue to follow throughout discharge.   Expected Discharge Plan: [place] Barriers to Discharge: Continued Medical Work up  Expected Discharge Plan and Services Expected Discharge Plan: [place] In-house Referral: Clinical Social Work Discharge Planning Services: CM Consult Post Acute Care Choice: [place]  arrangements for the past 2 months: [place]                 DME Arranged: Walker rolling with seat DME Agency: [employer] Date DME Agency Contacted: 11/22/20 Time DME Agency Contacted: [phone] Representative spoke with at DME Agency: Melina Modena HH Arranged: RN, PT, OT [facility] Agency: [place] Date [place]: 11/22/20   Representative spoke with at [place]: Adela Lank   Social Determinants of Health (SDOH) Interventions Food Insecurity Interventions: Assist with SNAP Application Financial Strain Interventions: Other (Comment) (Referral to [facility]) Housing Interventions: Intervention Not Indicated Transportation Interventions: Intervention Not Indicated  Readmission Risk Interventions No flowsheet data found.  Sitara Cashwell, MSW, [place] Heart Failure Social Worker

## 2020-11-23 NOTE — Progress Notes (Signed)
Nephrology Follow-Up Consult note   Assessment/Recommendations: Eliabeth Shoff is a/an 65 y.o. female with a past medical history significant for atrial fibrillation, HFrEF 2/2 NICM, CKD IV, SLE, HTN, COPD, pHTN, gout, hypothyroidism  who present w/ CHF exacerbation w/ shock    Oliguric AKI, improving now nonoliguric: Likely secondary to cardiorenal syndrome/shock.  Her creatinine continues to improve along with her volume status -Management of shock as below -Continue with diuresis as below -Continue to monitor daily Cr, Dose meds for GFR -Monitor Daily I/Os, Daily weight -Maintain MAP>65 for optimal renal perfusion. -Avoid nephrotoxic medications including NSAIDs and Vanc/Zosyn combo -Urinalysis is bland -Poor long-term dialysis candidate.  Fortunately no need for dialysis at this time given she is improving -Given she has significantly improved we will sign off at this time and encourage ongoing diuresis and management of her cardiogenic shock.  We will be available if any further assistance is needed in the future.   Heart failure exacerbation/shock: Concern for cardiogenic shock.  Heart failure managing.  Currently on milrinone, norepinephrine, and Lasix infusion.  Agree with ongoing diuretic management per heart failure team.  Hypokalemia: Secondary to increased urine output with Lasix.  Has improved with supplementation   Hypertension: Holding home medications History of SLE: Unclear details.  Urine without proteins of lupus unlikely contributing. Atrial fibrillation: Required cardioversion but back in atrial fibrillation.  On heparin and amiodarone. OSA: On CPAP Hypothyroidism: On Synthroid   Recommendations conveyed to primary service.    [facility] 11/23/2020 9:38 AM  ___________________________________________________________  CC: AKI, cardiogenic shock  Interval History/Subjective: Patient continues to have good urine output with  3.9 L made.  Creatinine continues to improve to 3.5.  Only on milrinone now.  Patient continues to feel much better today.   Medications:  Current Facility-Administered Medications  Medication Dose Route Frequency Provider Last Rate Last Admin   0.9 %  sodium chloride infusion  250 mL Intravenous PRN Wynetta Fines T, MD       albuterol (PROVENTIL) (2.5 MG/3ML) 0.083% nebulizer solution 2.5 mg  2.5 mg Nebulization Daily PRN Wynetta Fines T, MD       amiodarone (NEXTERONE PREMIX) 360-4.14 MG/200ML-% (1.8 mg/mL) IV infusion  60 mg/hr Intravenous Continuous Bensimhon, Shaune Pascal, MD 33.3 mL/hr at 11/23/20 0900 60 mg/hr at 11/23/20 0900   calcitRIOL (ROCALTROL) capsule 0.25 mcg  0.25 mcg Oral Daily Wynetta Fines T, MD   0.25 mcg at 11/23/20 1308   Chlorhexidine Gluconate Cloth 2 % PADS 6 each  6 each Topical Daily Lequita Halt, MD   6 each at 11/22/20 1833   clotrimazole (LOTRIMIN) 1 % cream 1 application  1 application Topical BID Wynetta Fines T, MD   1 application at 65/78/46 2230   cyclobenzaprine (FLEXERIL) tablet 10 mg  10 mg Oral TID PRN Lequita Halt, MD       estradiol (ESTRACE) tablet 1 mg  1 mg Oral Daily Wynetta Fines T, MD   1 mg at 11/23/20 0931   febuxostat (ULORIC) tablet 40 mg  40 mg Oral q AM Lequita Halt, MD   40 mg at 11/23/20 9629   ferrous sulfate tablet 325 mg  325 mg Oral q morning Wynetta Fines T, MD   325 mg at 11/22/20 1009   fluticasone (FLONASE) 50 MCG/ACT nasal spray 2 spray  2 spray Each Nare Daily Wynetta Fines T, MD   2 spray at 11/23/20 0931   fluticasone furoate-vilanterol (BREO ELLIPTA) 200-25 MCG/INH 1  puff  1 puff Inhalation Daily Wynetta Fines T, MD   1 puff at 11/23/20 0736   furosemide (LASIX) 200 mg in dextrose 5 % 100 mL (2 mg/mL) infusion  30 mg/hr Intravenous Continuous Bensimhon, Shaune Pascal, MD 10 mL/hr at 11/23/20 0900 20 mg/hr at 11/23/20 0900   gabapentin (NEURONTIN) capsule 100 mg  100 mg Oral TID Wynetta Fines T, MD   100 mg at 11/23/20 0931   heparin ADULT infusion 100  units/mL (25000 units/269m)  1,050 Units/hr Intravenous Continuous BEinar Grad RPH 10.5 mL/hr at 11/23/20 0900 1,050 Units/hr at 11/23/20 0900   HYDROcodone-acetaminophen (NORCO/VICODIN) 5-325 MG per tablet 1 tablet  1 tablet Oral Q6H PRN ZLequita Halt MD       levothyroxine (SYNTHROID) tablet 88 mcg  88 mcg Oral q morning Clegg, Amy D, NP   88 mcg at 11/23/20 0613   lidocaine (LIDODERM) 5 % 1 patch  1 patch Transdermal Q24H ZWynetta FinesT, MD   1 patch at 11/23/20 0931   milrinone (PRIMACOR) 20 MG/100 ML (0.2 mg/mL) infusion  0.25 mcg/kg/min Intravenous Continuous Clegg, Amy D, NP 9.48 mL/hr at 11/23/20 0900 0.25 mcg/kg/min at 11/23/20 0900   norepinephrine (LEVOPHED) 435min 25087mremix infusion  0-40 mcg/min Intravenous Titrated CarDion BodyD   Stopped at 11/23/20 0132   ondansetron (ZOFRAN) injection 4 mg  4 mg Intravenous Q6H PRN ZhaWynetta Fines MD   4 mg at 11/21/20 [id number] (Oxy IR/ROXICODONE) immediate release tablet 5 mg  5 mg Oral Q6H PRN CarDion BodyD   5 mg at 11/23/20 0156   potassium chloride SA (KLOR-CON) CR tablet 40 mEq  40 mEq Oral Once Clegg, Amy D, NP       potassium chloride SA (KLOR-CON) CR tablet 40 mEq  40 mEq Oral Daily Clegg, Amy D, NP   40 mEq at 11/23/20 0930   predniSONE (DELTASONE) tablet 30 mg  30 mg Oral Q breakfast Clegg, Amy D, NP   30 mg at 11/23/20 0815   [START ON 11/25/2020] predniSONE (DELTASONE) tablet 5 mg  5 mg Oral Q breakfast Clegg, Amy D, NP       sodium chloride flush (NS) 0.9 % injection 10-40 mL  10-40 mL Intracatheter Q12H Chandrasekhar, Mahesh A, MD   10 mL at 11/22/20 2228   sodium chloride flush (NS) 0.9 % injection 10-40 mL  10-40 mL Intracatheter PRN Chandrasekhar, Mahesh A, MD       sodium chloride flush (NS) 0.9 % injection 3 mL  3 mL Intravenous Q12H ZhaWynetta Fines MD   3 mL at 11/22/20 2228   sodium chloride flush (NS) 0.9 % injection 3 mL  3 mL Intravenous PRN ZhaWynetta Fines MD   3 mL at 11/20/20 2359       Review of Systems: 10 systems reviewed and negative except per interval history/subjective  Physical Exam: Vitals:   11/23/20 0801 11/23/20 0900  BP:    Pulse:  (!) 137  Resp:  (!) 25  Temp: (!) 97.5 F (36.4 C)   SpO2:  94%   Total I/O In: 352.5 [P.O.:150; I.V.:202.5] Out: 125 [Urine:125]  Intake/Output Summary (Last 24 hours) at 11/23/2020 [id number] data filed at 11/23/2020 0900 Gross per 24 hour  Intake 2954.71 ml  Output 4100 ml  Net -1145.29 ml   Constitutional: well-appearing, no acute distress ENMT: ears and nose without scars or lesions, MMM CV: normal rate, 2+ pitting edema in  the bilateral upper and lower extremities Respiratory: Bilateral chest rise, normal work of breathing Gastrointestinal: soft, non-tender, no palpable masses or hernias Skin: no visible lesions or rashes, warm lower extremities Psych: alert, judgement/insight appropriate, appropriate mood and affect   Test Results I personally reviewed new and old clinical labs and radiology tests Lab Results  Component Value Date   NA 131 (L) 11/23/2020   K 3.6 11/23/2020   CL 87 (L) 11/23/2020   CO2 29 11/23/2020   BUN 86 (H) 11/23/2020   CREATININE 3.53 (H) 11/23/2020   CALCIUM 9.8 11/23/2020   ALBUMIN 3.0 (L) 11/23/2020   PHOS 2.9 11/22/2020   Recent Results (from the past 2160 hour(s))  Basic metabolic panel     Status: Abnormal   Collection Time: 11/20/20  3:18 AM  Result Value Ref [place]   Sodium 137 135 - 145 mmol/L   Potassium 3.3 (L) 3.5 - 5.1 mmol/L   Chloride 96 (L) 98 - 111 mmol/L   CO2 27 22 - 32 mmol/L   Glucose, Bld 121 (H) 70 - 99 mg/dL    Comment: Glucose reference range applies only to samples taken after fasting for at least 8 hours.   BUN 94 (H) 8 - 23 mg/dL   Creatinine, Ser 3.73 (H) 0.44 - 1.00 mg/dL   Calcium 9.2 8.9 - 10.3 mg/dL   GFR, Estimated 13 (L) >60 mL/min    Comment: (NOTE) Calculated using the CKD-EPI Creatinine Equation (2021)    Anion gap 14 5 - 15     Comment: Performed at [facility], [address] [place], [place] [address]  CBC with Differential/Platelet     Status: Abnormal   Collection Time: 11/20/20  3:18 AM  Result Value Ref [place]   WBC 6.5 4.0 - 10.5 K/uL   RBC 4.51 3.87 - 5.11 MIL/uL   Hemoglobin 14.4 12.0 - 15.0 g/dL   HCT 43.1 36.0 - 46.0 %   MCV 95.6 80.0 - 100.0 fL   MCH 31.9 26.0 - 34.0 pg   MCHC 33.4 30.0 - 36.0 g/dL   RDW 13.6 11.5 - 15.5 %   Platelets 277 150 - 400 K/uL   nRBC 0.9 (H) 0.0 - 0.2 %   Neutrophils Relative % 76 %   Neutro Abs 4.8 1.7 - 7.7 K/uL   Lymphocytes Relative 16 %   Lymphs Abs 1.1 0.7 - 4.0 K/uL   Monocytes Relative 7 %   Monocytes Absolute 0.5 0.1 - 1.0 K/uL   Eosinophils Relative 1 %   Eosinophils Absolute 0.0 0.0 - 0.5 K/uL   Basophils Relative 0 %   Basophils Absolute 0.0 0.0 - 0.1 K/uL   Immature Granulocytes 0 %   Abs Immature Granulocytes 0.02 0.00 - 0.07 K/uL    Comment: Performed at [facility], [address] [place], [place] [address]  Brain natriuretic peptide     Status: Abnormal   Collection Time: 11/20/20  3:18 AM  Result Value Ref [place]   B Natriuretic Peptide 2,051.3 (H) 0.0 - 100.0 pg/mL    Comment: Performed at [facility], [address] [place], [place] [address]  Resp Panel by RT-PCR (Flu A&B, Covid) Nasopharyngeal Swab     Status: None   Collection Time: 11/20/20  3:24 AM   Specimen: Nasopharyngeal Swab; Nasopharyngeal(NP) swabs in vial transport medium  Result Value Ref [place]   SARS Coronavirus 2 by RT PCR NEGATIVE NEGATIVE    Comment: (  NOTE) SARS-CoV-2 target nucleic acids are NOT DETECTED.  The SARS-CoV-2 RNA is generally detectable in upper respiratory specimens during the acute phase of infection. The lowest concentration of SARS-CoV-2 viral copies this assay can detect is 138 copies/mL. A negative result does not preclude SARS-Cov-2 infection and should not be used as the sole basis for treatment or other patient  management decisions. A negative result may occur with  improper specimen collection/handling, submission of specimen other than nasopharyngeal swab, presence of viral mutation(s) within the areas targeted by this assay, and inadequate number of viral copies(<138 copies/mL). A negative result must be combined with clinical observations, patient history, and epidemiological information. The expected result is Negative.  Fact Sheet for Patients:  EntrepreneurPulse.com.au  Fact Sheet for Healthcare Providers:  IncredibleEmployment.be  This test is no t yet approved or cleared by the [place] FDA and  has been authorized for detection and/or diagnosis of SARS-CoV-2 by FDA under an Emergency Use Authorization (EUA). This EUA will remain  in effect (meaning this test can be used) for the duration of the COVID-19 declaration under Section 564(b)(1) of the Act, 21 U.S.C.section 360bbb-3(b)(1), unless the authorization is terminated  or revoked sooner.       Influenza A by PCR NEGATIVE NEGATIVE   Influenza B by PCR NEGATIVE NEGATIVE    Comment: (NOTE) The Xpert Xpress SARS-CoV-2/FLU/RSV plus assay is intended as an aid in the diagnosis of influenza from Nasopharyngeal swab specimens and should not be used as a sole basis for treatment. Nasal washings and aspirates are unacceptable for Xpert Xpress SARS-CoV-2/FLU/RSV testing.  Fact Sheet for Patients: EntrepreneurPulse.com.au  Fact Sheet for Healthcare Providers: IncredibleEmployment.be  This test is not yet approved or cleared by the [place] FDA and has been authorized for detection and/or diagnosis of SARS-CoV-2 by FDA under an Emergency Use Authorization (EUA). This EUA will remain in effect (meaning this test can be used) for the duration of the COVID-19 declaration under Section 564(b)(1) of the Act, 21 U.S.C. section 360bbb-3(b)(1), unless the  authorization is terminated or revoked.  Performed at [facility], [address] [place], [place] [address]   Troponin I (High Sensitivity)     Status: Abnormal   Collection Time: 11/20/20  3:35 AM  Result Value Ref [place]   Troponin I (High Sensitivity) 38 (H) <18 ng/L    Comment: (NOTE) Elevated high sensitivity troponin I (hsTnI) values and significant  changes across serial measurements may suggest ACS but many other  chronic and acute conditions are known to elevate hsTnI results.  Refer to the "Links" section for chest pain algorithms and additional  guidance. Performed at [facility], [address] [place], [place] [address]   Troponin I (High Sensitivity)     Status: Abnormal   Collection Time: 11/20/20  6:00 AM  Result Value Ref [place]   Troponin I (High Sensitivity) 35 (H) <18 ng/L    Comment: (NOTE) Elevated high sensitivity troponin I (hsTnI) values and significant  changes across serial measurements may suggest ACS but many other  chronic and acute conditions are known to elevate hsTnI results.  Refer to the "Links" section for chest pain algorithms and additional  guidance. Performed at [facility], [address] [place], [place] [address]   Glucose, capillary     Status: Abnormal   Collection Time: 11/20/20  6:46 PM  Result Value Ref Range   Glucose-Capillary 145 (H) 70 - 99 mg/dL  Comment: Glucose reference range applies only to samples taken after fasting for at least 8 hours.  Lactic acid, plasma     Status: None   Collection Time: 11/20/20  9:40 PM  Result Value Ref [place]   Lactic Acid, Venous 1.6 0.5 - 1.9 mmol/L    Comment: Performed at [facility], [place] [address], [place], [place] [address]  TSH     Status: Abnormal   Collection Time: 11/20/20  9:40 PM  Result Value Ref [place]   TSH 8.239 (H) 0.350 - 4.500 uIU/mL    Comment: Performed by a 3rd Generation assay with a functional sensitivity of <=0.01  uIU/mL. Performed at [facility], [place] [address] [place], [place] [address]   CBC     Status: Abnormal   Collection Time: 11/20/20  9:41 PM  Result Value Ref [place]   WBC 6.2 4.0 - 10.5 K/uL   RBC 4.23 3.87 - 5.11 MIL/uL   Hemoglobin 13.6 12.0 - 15.0 g/dL   HCT 40.2 36.0 - 46.0 %   MCV 95.0 80.0 - 100.0 fL   MCH 32.2 26.0 - 34.0 pg   MCHC 33.8 30.0 - 36.0 g/dL   RDW 13.6 11.5 - 15.5 %   Platelets 261 150 - 400 K/uL   nRBC 1.0 (H) 0.0 - 0.2 %    Comment: Performed at [facility] [address] [place], [place] [address]  Comprehensive metabolic panel     Status: Abnormal   Collection Time: 11/20/20  9:41 PM  Result Value Ref [place]   Sodium 134 (L) 135 - 145 mmol/L   Potassium 3.1 (L) 3.5 - 5.1 mmol/L   Chloride 98 98 - 111 mmol/L   CO2 22 22 - 32 mmol/L   Glucose, Bld 150 (H) 70 - 99 mg/dL    Comment: Glucose reference range applies only to samples taken after fasting for at least 8 hours.   BUN 98 (H) 8 - 23 mg/dL   Creatinine, Ser 3.90 (H) 0.44 - 1.00 mg/dL   Calcium 9.1 8.9 - 10.3 mg/dL   Total Protein 5.6 (L) 6.5 - 8.1 g/dL   Albumin 2.8 (L) 3.5 - 5.0 g/dL   AST 199 (H) 15 - 41 U/L   ALT 297 (H) 0 - 44 U/L   Alkaline Phosphatase 76 38 - 126 U/L   Total Bilirubin 1.2 0.3 - 1.2 mg/dL   GFR, Estimated 12 (L) >60 mL/min    Comment: (NOTE) Calculated using the CKD-EPI Creatinine Equation (2021)    Anion gap 14 5 - 15    Comment: Performed at [facility] [address] [place], [place] [address]  Bilirubin, direct     Status: None   Collection Time: 11/20/20  9:41 PM  Result Value Ref [place]   Bilirubin, Direct 0.2 0.0 - 0.2 mg/dL    Comment: Performed at [facility] [address] [place], [place] [address]  Uric acid     Status: Abnormal   Collection Time: 11/20/20  9:41 PM  Result Value Ref Range   Uric Acid, Serum 10.3 (H) 2.5 - 7.1 mg/dL    Comment: Performed at [facility] [address], [place], [place] [address]   Magnesium     Status: None   Collection Time: 11/20/20  9:41 PM  Result Value Ref [place]   Magnesium 2.0 1.7 - 2.4 mg/dL    Comment: Performed at [facility], [address] [place], [place] [address]  I-STAT 7, (LYTES, BLD GAS, ICA, H+H)     Status: Abnormal   Collection Time: 11/20/20  9:43 PM  Result Value Ref Range   pH, Arterial 7.487 (H) 7.350 - 7.450   pCO2 arterial 30.6 (L) 32.0 - 48.0 mmHg   pO2, Arterial 95 83.0 - 108.0 mmHg   Bicarbonate 23.3 20.0 - 28.0 mmol/L   TCO2 24 22 - 32 mmol/L   O2 Saturation 98.0 %   Acid-Base Excess 0.0 0.0 - 2.0 mmol/L   Sodium 135 135 - 145 mmol/L   Potassium 3.1 (L) 3.5 - 5.1 mmol/L   Calcium, Ion 1.13 (L) 1.15 - 1.40 mmol/L   HCT 40.0 36.0 - 46.0 %   Hemoglobin 13.6 12.0 - 15.0 g/dL   Patient temperature 97.6 F    Sample type ARTERIAL   Glucose, capillary     Status: Abnormal   Collection Time: 11/20/20 11:58 PM  Result Value Ref Range   Glucose-Capillary 119 (H) 70 - 99 mg/dL    Comment: Glucose reference range applies only to samples taken after fasting for at least 8 hours.  HIV Antibody (routine testing w rflx)     Status: None   Collection Time: 11/21/20  2:58 AM  Result Value Ref Range   HIV Screen 4th Generation wRfx Non Reactive Non Reactive    Comment: Performed at [facility], [place] [address], [place], [place] [address]  Basic metabolic panel     Status: Abnormal   Collection Time: 11/21/20  2:58 AM  Result Value Ref [place]   Sodium 132 (L) 135 - 145 mmol/L   Potassium 4.6 3.5 - 5.1 mmol/L   Chloride 97 (L) 98 - 111 mmol/L   CO2 20 (L) 22 - 32 mmol/L   Glucose, Bld 128 (H) 70 - 99 mg/dL    Comment: Glucose reference range applies only to samples taken after fasting for at least 8 hours.   BUN 99 (H) 8 - 23 mg/dL   Creatinine, Ser 3.95 (H) 0.44 - 1.00 mg/dL   Calcium 9.3 8.9 - 10.3 mg/dL   GFR, Estimated 12 (L) >60 mL/min    Comment: (NOTE) Calculated using the CKD-EPI Creatinine Equation (2021)    Anion  gap 15 5 - 15    Comment: Performed at [facility] [address], [place], [place] [address]  Brain natriuretic peptide     Status: Abnormal   Collection Time: 11/21/20  2:58 AM  Result Value Ref Range   B Natriuretic Peptide 2,022.4 (H) 0.0 - 100.0 pg/mL    Comment: Performed at [facility] [address] [place], [place] [address]  CBC     Status: Abnormal   Collection Time: 11/21/20  2:58 AM  Result Value Ref [place]   WBC 6.1 4.0 - 10.5 K/uL   RBC 4.37 3.87 - 5.11 MIL/uL   Hemoglobin 14.2 12.0 - 15.0 g/dL   HCT 41.9 36.0 - 46.0 %   MCV 95.9 80.0 - 100.0 fL   MCH 32.5 26.0 - 34.0 pg   MCHC 33.9 30.0 - 36.0 g/dL   RDW 13.7 11.5 - 15.5 %   Platelets 261 150 - 400 K/uL   nRBC 1.0 (H) 0.0 - 0.2 %    Comment: Performed at [facility], [place] [address] [place], [place] [address]  Magnesium     Status: None   Collection Time: 11/21/20  2:58 AM  Result  Value Ref Range   Magnesium 2.1 1.7 - 2.4 mg/dL    Comment: Performed at [facility], [place] [address] [place], [place] [address]  Lactic acid, plasma     Status: None   Collection Time: 11/21/20  2:58 AM  Result Value Ref Range   Lactic Acid, Venous 1.5 0.5 - 1.9 mmol/L    Comment: Performed at [facility] [address] [place], [place] [address]  Protime-INR     Status: Abnormal   Collection Time: 11/21/20  2:58 AM  Result Value Ref Range   Prothrombin Time 28.4 (H) 11.4 - 15.2 seconds   INR 2.7 (H) 0.8 - 1.2    Comment: (NOTE) INR goal varies based on device and disease states. Performed at [facility], [place] [address], [place], [place] [address]   Basic metabolic panel     Status: Abnormal   Collection Time: 11/21/20  5:50 AM  Result Value Ref Range   Sodium 136 135 - 145 mmol/L   Potassium 4.4 3.5 - 5.1 mmol/L   Chloride 97 (L) 98 - 111 mmol/L   CO2 25 22 - 32 mmol/L   Glucose, Bld 161 (H) 70 - 99 mg/dL    Comment: Glucose reference range applies only to samples taken after  fasting for at least 8 hours.   BUN 99 (H) 8 - 23 mg/dL   Creatinine, Ser 4.21 (H) 0.44 - 1.00 mg/dL   Calcium 9.2 8.9 - 10.3 mg/dL   GFR, Estimated 11 (L) >60 mL/min    Comment: (NOTE) Calculated using the CKD-EPI Creatinine Equation (2021)    Anion gap 14 5 - 15    Comment: Performed at [facility] [address], [place], [place] [address]  .Cooxemetry Panel (carboxy, met, total hgb, O2 sat)     Status: None   Collection Time: 11/21/20  5:50 AM  Result Value Ref Range   Total hemoglobin 13.9 12.0 - 16.0 g/dL   O2 Saturation 42.7 %   Carboxyhemoglobin 0.7 0.5 - 1.5 %   Methemoglobin 0.9 0.0 - 1.5 %    Comment: Performed at [facility], [place] [address], [place], [place] [address]  Cooxemetry Panel (carboxy, met, total hgb, O2 sat)     Status: None   Collection Time: 11/21/20  7:45 AM  Result Value Ref [place]   Total hemoglobin 13.8 12.0 - 16.0 g/dL   O2 Saturation 54.5 %   Carboxyhemoglobin 0.9 0.5 - 1.5 %   Methemoglobin 0.9 0.0 - 1.5 %    Comment: Performed at [facility], [place] [address], [place], [place] [address]  Lactic acid, plasma     Status: None   Collection Time: 11/21/20  8:03 AM  Result Value Ref Range   Lactic Acid, Venous 1.2 0.5 - 1.9 mmol/L    Comment: Performed at [facility] [address], [place], [place] [address]  MRSA Next Gen by PCR, Nasal     Status: None   Collection Time: 11/21/20  8:23 AM   Specimen: Nasal Mucosa; Nasal Swab  Result Value Ref Range   MRSA by PCR Next Gen NOT DETECTED NOT DETECTED    Comment: (NOTE) The GeneXpert MRSA Assay (FDA approved for NASAL specimens only), is one component of a comprehensive MRSA colonization surveillance program. It is not intended to diagnose MRSA infection nor to guide or monitor treatment for MRSA infections. Test performance is not FDA approved in patients  less than 77 years old. Performed at [facility], [place] [address], [place], [place] [address]   T3, free      Status: None   Collection Time: 11/21/20  8:26 AM  Result Value Ref Range   T3, Free 2.6 2.0 - 4.4 pg/mL    Comment: (NOTE) Performed At: [facility] [place], [place] [id number] Rush Farmer MD [id number]   T4, free     Status: Abnormal   Collection Time: 11/21/20  8:26 AM  Result Value Ref [place]   Free T4 2.03 (H) 0.61 - 1.12 ng/dL    Comment: (NOTE) Biotin ingestion may interfere with free T4 tests. If the results are inconsistent with the TSH level, previous test results, or the clinical presentation, then consider biotin interference. If needed, order repeat testing after stopping biotin. Performed at [facility], [place] [address], [place], [place] [address]   ECHOCARDIOGRAM COMPLETE     Status: None   Collection Time: 11/21/20  8:30 AM  Result Value Ref [place]   Weight 4,457.6 oz   Height 63.5 in   BP 83/64 mmHg   Single Plane A2C EF 33.2 %   Single Plane A4C EF 29.9 %   Calc EF 30.5 %   S' Lateral 3.30 cm   Area-P 1/2 6.02 cm2   Radius 0.50 cm   MV M vel 4.33 m/s   MV Peak grad 75.0 mmHg  C-reactive protein     Status: Abnormal   Collection Time: 11/21/20 12:05 PM  Result Value Ref [place]   CRP 1.4 (H) <1.0 mg/dL    Comment: Performed at [facility] [address] [place], [place] [address]  ANA w/Reflex if Positive     Status: None   Collection Time: 11/21/20 12:05 PM  Result Value Ref [place]   Anti Nuclear Antibody (ANA) Negative Negative    Comment: (NOTE) Performed At: [facility] [place], [place] [id number] Rush Farmer MD [id number]   Anti-DNA antibody, double-stranded     Status: None   Collection Time: 11/21/20 12:05 PM  Result Value Ref [place]   ds DNA Ab <1 0 - 9 IU/mL    Comment: (NOTE)                                   Negative      <5                                   Equivocal  5 - 9                                   Positive      >9 Performed At: [facility] [place], [place] [id number] Rush Farmer MD [id number]   C3 complement     Status: None   Collection Time: 11/21/20 12:05 PM  Result Value Ref [place]   C3 Complement 109 82 - 167 mg/dL    Comment: (NOTE) Performed At: [facility] [place], [place] [id number] Rush Farmer MD [id number]   C4 complement     Status: None   Collection Time: 11/21/20 12:05 PM  Result Value Ref [place]   Complement C4, Body Fluid 25  12 - 38 mg/dL    Comment: (NOTE) Performed At: [facility] [place], [place] [id number] Rush Farmer MD [id number]   Protein / creatinine ratio, urine     Status: None   Collection Time: 11/21/20  3:45 PM  Result Value Ref [place]   Creatinine, Urine 30.78 mg/dL   Total Protein, Urine <3.0 mg/dL    Comment: NO NORMAL RANGE ESTABLISHED FOR THIS TEST   Protein Creatinine Ratio        0.00 - 0.15 mg/mg[Cre]    Comment: RESULT BELOW REPORTABLE RANGE, UNABLE TO CALCULATE. Performed at [facility], [place] [address], [place], [place] [address]   Urinalysis, Routine w reflex microscopic Urine, Catheterized     Status: None   Collection Time: 11/21/20  3:46 PM  Result Value Ref Range   Color, Urine YELLOW YELLOW   APPearance CLEAR CLEAR   Specific Gravity, Urine 1.008 1.005 - 1.030   pH 5.0 5.0 - 8.0   Glucose, UA NEGATIVE NEGATIVE mg/dL   Hgb urine dipstick NEGATIVE NEGATIVE   Bilirubin Urine NEGATIVE NEGATIVE   Ketones, ur NEGATIVE NEGATIVE mg/dL   Protein, ur NEGATIVE NEGATIVE mg/dL   Nitrite NEGATIVE NEGATIVE   Leukocytes,Ua NEGATIVE NEGATIVE    Comment: Performed at [facility] [address], [place], [place] [address]  Cooxemetry Panel (carboxy, met, total hgb, O2 sat)     Status: None   Collection Time: 11/21/20  6:05 PM  Result Value Ref Range   Total hemoglobin 13.6 12.0 - 16.0 g/dL   O2 Saturation 71.6 %   Carboxyhemoglobin 1.1 0.5 - 1.5 %   Methemoglobin 0.8 0.0  - 1.5 %    Comment: Performed at [facility] [address], [place], [place] [address]  APTT     Status: Abnormal   Collection Time: 11/21/20  7:42 PM  Result Value Ref Range   aPTT 122 (H) 24 - 36 seconds    Comment:        IF BASELINE aPTT IS ELEVATED, SUGGEST PATIENT RISK ASSESSMENT BE USED TO DETERMINE APPROPRIATE ANTICOAGULANT THERAPY. Performed at [facility], [place] [address] [place], [place] [address]   Basic metabolic panel     Status: Abnormal   Collection Time: 11/21/20  7:42 PM  Result Value Ref Range   Sodium 132 (L) 135 - 145 mmol/L   Potassium 3.4 (L) 3.5 - 5.1 mmol/L   Chloride 95 (L) 98 - 111 mmol/L   CO2 24 22 - 32 mmol/L   Glucose, Bld 177 (H) 70 - 99 mg/dL    Comment: Glucose reference range applies only to samples taken after fasting for at least 8 hours.   BUN 98 (H) 8 - 23 mg/dL   Creatinine, Ser 3.92 (H) 0.44 - 1.00 mg/dL   Calcium 9.3 8.9 - 10.3 mg/dL   GFR, Estimated 12 (L) >60 mL/min    Comment: (NOTE) Calculated using the CKD-EPI Creatinine Equation (2021)    Anion gap 13 5 - 15    Comment: Performed at [facility] [address], [place], [place] [address]  APTT     Status: Abnormal   Collection Time: 11/21/20  8:47 PM  Result Value Ref Range   aPTT 129 (H) 24 - 36 seconds    Comment:        IF BASELINE aPTT IS ELEVATED, SUGGEST PATIENT RISK ASSESSMENT BE USED TO DETERMINE APPROPRIATE ANTICOAGULANT THERAPY. Performed at [facility], [address], [place], [place] [address]   Cooxemetry Panel (carboxy, met, total hgb, O2 sat)     Status: None   Collection Time: 11/22/20  4:47 AM  Result Value Ref Range   Total hemoglobin 12.9 12.0 - 16.0 g/dL   O2 Saturation 71.4 %   Carboxyhemoglobin 1.1 0.5 - 1.5 %   Methemoglobin 1.0 0.0 - 1.5 %    Comment: Performed at [facility] [address], [place], [place] [address]  CBC     Status: Abnormal   Collection Time: 11/22/20  4:47 AM  Result Value Ref Range    WBC 7.5 4.0 - 10.5 K/uL   RBC 4.03 3.87 - 5.11 MIL/uL   Hemoglobin 12.8 12.0 - 15.0 g/dL   HCT 38.0 36.0 - 46.0 %   MCV 94.3 80.0 - 100.0 fL   MCH 31.8 26.0 - 34.0 pg   MCHC 33.7 30.0 - 36.0 g/dL   RDW 13.2 11.5 - 15.5 %   Platelets 256 150 - 400 K/uL   nRBC 0.4 (H) 0.0 - 0.2 %    Comment: Performed at [facility], [place] [address], [place], [place] [address]  Comprehensive metabolic panel     Status: Abnormal   Collection Time: 11/22/20  4:47 AM  Result Value Ref Range   Sodium 131 (L) 135 - 145 mmol/L   Potassium 2.8 (L) 3.5 - 5.1 mmol/L   Chloride 91 (L) 98 - 111 mmol/L   CO2 27 22 - 32 mmol/L   Glucose, Bld 144 (H) 70 - 99 mg/dL    Comment: Glucose reference range applies only to samples taken after fasting for at least 8 hours.   BUN 92 (H) 8 - 23 mg/dL   Creatinine, Ser 3.82 (H) 0.44 - 1.00 mg/dL    Comment: DELTA CHECK NOTED   Calcium 9.3 8.9 - 10.3 mg/dL   Total Protein 5.5 (L) 6.5 - 8.1 g/dL   Albumin 2.8 (L) 3.5 - 5.0 g/dL   AST 216 (H) 15 - 41 U/L   ALT 513 (H) 0 - 44 U/L   Alkaline Phosphatase 71 38 - 126 U/L   Total Bilirubin 1.2 0.3 - 1.2 mg/dL   GFR, Estimated 13 (L) >60 mL/min    Comment: (NOTE) Calculated using the CKD-EPI Creatinine Equation (2021)    Anion gap 13 5 - 15    Comment: Performed at [facility] [address] [place], [place] [address]  Heparin level (unfractionated)     Status: Abnormal   Collection Time: 11/22/20  4:47 AM  Result Value Ref Range   Heparin Unfractionated >1.10 (H) 0.30 - 0.70 IU/mL    Comment: (NOTE) The clinical reportable range upper limit is being lowered to >1.10 to align with the FDA approved guidance for the current laboratory assay.  If heparin results are below expected values, and patient dosage has  been confirmed, suggest follow up testing of antithrombin III levels. Performed at [facility], [place] [address] [place], [place] [address]   APTT     Status: Abnormal   Collection Time:  11/22/20  4:47 AM  Result Value Ref Range   aPTT 106 (H) 24 - 36 seconds    Comment:        IF BASELINE aPTT IS ELEVATED, SUGGEST PATIENT RISK ASSESSMENT BE USED TO DETERMINE APPROPRIATE ANTICOAGULANT THERAPY. Performed at [facility], [place] [address] [place], [place] [address]   Basic metabolic  panel     Status: Abnormal   Collection Time: 11/22/20 10:18 AM  Result Value Ref Range   Sodium 130 (L) 135 - 145 mmol/L   Potassium 3.3 (L) 3.5 - 5.1 mmol/L   Chloride 89 (L) 98 - 111 mmol/L   CO2 25 22 - 32 mmol/L   Glucose, Bld 179 (H) 70 - 99 mg/dL    Comment: Glucose reference range applies only to samples taken after fasting for at least 8 hours.   BUN 92 (H) 8 - 23 mg/dL   Creatinine, Ser 3.78 (H) 0.44 - 1.00 mg/dL   Calcium 9.6 8.9 - 10.3 mg/dL   GFR, Estimated 13 (L) >60 mL/min    Comment: (NOTE) Calculated using the CKD-EPI Creatinine Equation (2021)    Anion gap 16 (H) 5 - 15    Comment: Performed at [facility] [address] [place], [place] [address]  Magnesium     Status: None   Collection Time: 11/22/20 10:18 AM  Result Value Ref Range   Magnesium 1.7 1.7 - 2.4 mg/dL    Comment: Performed at [facility] [address] [place], [place] [address]  Renal function panel     Status: Abnormal   Collection Time: 11/22/20  2:00 PM  Result Value Ref Range   Sodium 129 (L) 135 - 145 mmol/L   Potassium 3.5 3.5 - 5.1 mmol/L   Chloride 89 (L) 98 - 111 mmol/L   CO2 25 22 - 32 mmol/L   Glucose, Bld 201 (H) 70 - 99 mg/dL    Comment: Glucose reference range applies only to samples taken after fasting for at least 8 hours.   BUN 91 (H) 8 - 23 mg/dL   Creatinine, Ser 3.82 (H) 0.44 - 1.00 mg/dL   Calcium 9.4 8.9 - 10.3 mg/dL   Phosphorus 2.9 2.5 - 4.6 mg/dL   Albumin 2.9 (L) 3.5 - 5.0 g/dL   GFR, Estimated 13 (L) >60 mL/min    Comment: (NOTE) Calculated using the CKD-EPI Creatinine Equation (2021)    Anion gap 15 5 - 15    Comment: Performed at [facility] [address], [place], [place] [address]  CBC     Status: Abnormal   Collection Time: 11/23/20  4:49 AM  Result Value Ref Range   WBC 6.9 4.0 - 10.5 K/uL   RBC 4.04 3.87 - 5.11 MIL/uL   Hemoglobin 12.8 12.0 - 15.0 g/dL   HCT 37.9 36.0 - 46.0 %   MCV 93.8 80.0 - 100.0 fL   MCH 31.7 26.0 - 34.0 pg   MCHC 33.8 30.0 - 36.0 g/dL   RDW 13.4 11.5 - 15.5 %   Platelets 253 150 - 400 K/uL   nRBC 0.7 (H) 0.0 - 0.2 %    Comment: Performed at [facility], [place] [address] [place], [place] [address]  Comprehensive metabolic panel     Status: Abnormal   Collection Time: 11/23/20  4:49 AM  Result Value Ref Range   Sodium 131 (L) 135 - 145 mmol/L   Potassium 3.6 3.5 - 5.1 mmol/L   Chloride 87 (L) 98 - 111 mmol/L   CO2 29 22 - 32 mmol/L   Glucose, Bld 142 (H) 70 - 99 mg/dL    Comment: Glucose reference range applies only to samples taken after fasting for at least 8 hours.   BUN 86 (H) 8 - 23 mg/dL   Creatinine, Ser 3.53 (H) 0.44 -  1.00 mg/dL   Calcium 9.8 8.9 - 10.3 mg/dL   Total Protein 5.9 (L) 6.5 - 8.1 g/dL   Albumin 3.0 (L) 3.5 - 5.0 g/dL   AST 74 (H) 15 - 41 U/L   ALT 359 (H) 0 - 44 U/L   Alkaline Phosphatase 71 38 - 126 U/L   Total Bilirubin 1.2 0.3 - 1.2 mg/dL   GFR, Estimated 14 (L) >60 mL/min    Comment: (NOTE) Calculated using the CKD-EPI Creatinine Equation (2021)    Anion gap 15 5 - 15    Comment: Performed at [facility] [address], [place], [place] [address]  APTT     Status: Abnormal   Collection Time: 11/23/20  4:49 AM  Result Value Ref Range   aPTT 64 (H) 24 - 36 seconds    Comment:        IF BASELINE aPTT IS ELEVATED, SUGGEST PATIENT RISK ASSESSMENT BE USED TO DETERMINE APPROPRIATE ANTICOAGULANT THERAPY. Performed at [facility], [place] [address], [place], [place] [address]   Magnesium     Status: Abnormal   Collection Time: 11/23/20  4:49 AM  Result Value Ref Range   Magnesium 2.7 (H) 1.7 - 2.4 mg/dL    Comment: Performed  at [facility] [address], [place], [place] [address]  Heparin level (unfractionated)     Status: Abnormal   Collection Time: 11/23/20  4:49 AM  Result Value Ref Range   Heparin Unfractionated >1.10 (H) 0.30 - 0.70 IU/mL    Comment: (NOTE) The clinical reportable range upper limit is being lowered to >1.10 to align with the FDA approved guidance for the current laboratory assay.  If heparin results are below expected values, and patient dosage has  been confirmed, suggest follow up testing of antithrombin III levels. Performed at [facility], [place] [address], [place], [place] [address]   Cooxemetry Panel (carboxy, met, total hgb, O2 sat)     Status: None   Collection Time: 11/23/20  5:00 AM  Result Value Ref Range   Total hemoglobin 13.1 12.0 - 16.0 g/dL   O2 Saturation 68.3 %   Carboxyhemoglobin 1.4 0.5 - 1.5 %   Methemoglobin 0.9 0.0 - 1.5 %    Comment: Performed at [facility] [address] [place], [place] [address]

## 2020-11-23 NOTE — Progress Notes (Signed)
Pharmacist Heart Failure Core Measure Documentation  Assessment: Caitlyn Fuentes has an EF documented as 25-30% on 11/21/20 by ECHO.  Rationale: Heart failure patients with left ventricular systolic dysfunction (LVSD) and an EF < 40% should be prescribed an angiotensin converting enzyme inhibitor (ACEI) or angiotensin receptor blocker (ARB) at discharge unless a contraindication is documented in the medical record.  This patient is not currently on an ACEI or ARB for HF.  This note is being placed in the record in order to provide documentation that a contraindication to the use of these agents is present for this encounter.  ACE Inhibitor or Angiotensin Receptor Blocker is contraindicated (specify all that apply)  []   ACEI allergy AND ARB allergy []   Angioedema []   Moderate or severe aortic stenosis []   Hyperkalemia []   Hypotension []   Renal artery stenosis [x]   Worsening renal function, preexisting renal disease or dysfunction   Uvaldo Rising, BCPS, BCCP Clinical Pharmacist  11/23/2020 3:20 PM   [facility] pharmacy phone numbers are listed on [email or web address]

## 2020-11-24 ENCOUNTER — Inpatient Hospital Stay (HOSPITAL_COMMUNITY): Payer: Medicare Other

## 2020-11-24 ENCOUNTER — Encounter (HOSPITAL_COMMUNITY)
Admission: EM | Disposition: A | Discharge status: Hospice | Payer: Self-pay | Source: Home / Self Care | Attending: Internal Medicine

## 2020-11-24 ENCOUNTER — Encounter (HOSPITAL_COMMUNITY): Payer: Self-pay | Admitting: Internal Medicine

## 2020-11-24 ENCOUNTER — Inpatient Hospital Stay (HOSPITAL_COMMUNITY): Payer: Medicare Other | Admitting: Certified Registered Nurse Anesthetist

## 2020-11-24 DIAGNOSIS — I361 Nonrheumatic tricuspid (valve) insufficiency: Secondary | ICD-10-CM

## 2020-11-24 DIAGNOSIS — I340 Nonrheumatic mitral (valve) insufficiency: Secondary | ICD-10-CM | POA: Diagnosis not present

## 2020-11-24 DIAGNOSIS — I4891 Unspecified atrial fibrillation: Secondary | ICD-10-CM | POA: Diagnosis not present

## 2020-11-24 DIAGNOSIS — R570 Cardiogenic shock: Secondary | ICD-10-CM | POA: Diagnosis not present

## 2020-11-24 HISTORY — PX: CARDIOVERSION: SHX1299

## 2020-11-24 HISTORY — PX: TEE WITHOUT CARDIOVERSION: SHX5443

## 2020-11-24 LAB — COMPREHENSIVE METABOLIC PANEL
ALT: 245 U/L — ABNORMAL HIGH (ref 0–44)
AST: 36 U/L (ref 15–41)
Albumin: 2.9 g/dL — ABNORMAL LOW (ref 3.5–5.0)
Alkaline Phosphatase: 66 U/L (ref 38–126)
Anion gap: 17 — ABNORMAL HIGH (ref 5–15)
BUN: 93 mg/dL — ABNORMAL HIGH (ref 8–23)
CO2: 26 mmol/L (ref 22–32)
Calcium: 10 mg/dL (ref 8.9–10.3)
Chloride: 86 mmol/L — ABNORMAL LOW (ref 98–111)
Creatinine, Ser: 4.16 mg/dL — ABNORMAL HIGH (ref 0.44–1.00)
GFR, Estimated: 11 mL/min — ABNORMAL LOW (ref >60)
Glucose, Bld: 189 mg/dL — ABNORMAL HIGH (ref 70–99)
Potassium: 3.7 mmol/L (ref 3.5–5.1)
Sodium: 129 mmol/L — ABNORMAL LOW (ref 135–145)
Total Bilirubin: 1.1 mg/dL (ref 0.3–1.2)
Total Protein: 5.8 g/dL — ABNORMAL LOW (ref 6.5–8.1)

## 2020-11-24 LAB — COOXEMETRY PANEL
Carboxyhemoglobin: 1.6 % — ABNORMAL HIGH (ref 0.5–1.5)
Carboxyhemoglobin: 0.9 % (ref 0.5–1.5)
Carboxyhemoglobin: 1.2 % (ref 0.5–1.5)
Carboxyhemoglobin: 1.3 % (ref 0.5–1.5)
Methemoglobin: 0.7 % (ref 0.0–1.5)
Methemoglobin: 0.6 % (ref 0.0–1.5)
Methemoglobin: 0.7 % (ref 0.0–1.5)
Methemoglobin: 0.8 % (ref 0.0–1.5)
O2 Saturation: 97.7 %
O2 Saturation: 37.6 %
O2 Saturation: 49.5 %
O2 Saturation: 58.5 %
Total hemoglobin: 12.5 g/dL (ref 12.0–16.0)
Total hemoglobin: 12.8 g/dL (ref 12.0–16.0)
Total hemoglobin: 12.6 g/dL (ref 12.0–16.0)
Total hemoglobin: 12 g/dL (ref 12.0–16.0)

## 2020-11-24 LAB — CBC
HCT: 36.4 % (ref 36.0–46.0)
Hemoglobin: 12 g/dL (ref 12.0–15.0)
MCH: 31.7 pg (ref 26.0–34.0)
MCHC: 33 g/dL (ref 30.0–36.0)
MCV: 96 fL (ref 80.0–100.0)
Platelets: 250 10*3/uL (ref 150–400)
RBC: 3.79 MIL/uL — ABNORMAL LOW (ref 3.87–5.11)
RDW: 13.8 % (ref 11.5–15.5)
WBC: 7.5 10*3/uL (ref 4.0–10.5)
nRBC: 0.7 % — ABNORMAL HIGH (ref 0.0–0.2)

## 2020-11-24 LAB — GLUCOSE, CAPILLARY
Glucose-Capillary: 105 mg/dL — ABNORMAL HIGH (ref 70–99)
Glucose-Capillary: 146 mg/dL — ABNORMAL HIGH (ref 70–99)
Glucose-Capillary: 223 mg/dL — ABNORMAL HIGH (ref 70–99)

## 2020-11-24 LAB — RENAL FUNCTION PANEL
Albumin: 2.9 g/dL — ABNORMAL LOW (ref 3.5–5.0)
Anion gap: 13 (ref 5–15)
BUN: 93 mg/dL — ABNORMAL HIGH (ref 8–23)
CO2: 29 mmol/L (ref 22–32)
Calcium: 9.4 mg/dL (ref 8.9–10.3)
Chloride: 89 mmol/L — ABNORMAL LOW (ref 98–111)
Creatinine, Ser: 4 mg/dL — ABNORMAL HIGH (ref 0.44–1.00)
GFR, Estimated: 12 mL/min — ABNORMAL LOW (ref >60)
Glucose, Bld: 169 mg/dL — ABNORMAL HIGH (ref 70–99)
Phosphorus: 3.3 mg/dL (ref 2.5–4.6)
Potassium: 3.9 mmol/L (ref 3.5–5.1)
Sodium: 131 mmol/L — ABNORMAL LOW (ref 135–145)

## 2020-11-24 LAB — HEPARIN LEVEL (UNFRACTIONATED): Heparin Unfractionated: 0.77 IU/mL — ABNORMAL HIGH (ref 0.30–0.70)

## 2020-11-24 LAB — APTT
aPTT: 45 seconds — ABNORMAL HIGH (ref 24–36)
aPTT: 65 seconds — ABNORMAL HIGH (ref 24–36)

## 2020-11-24 LAB — HEMOGLOBIN A1C
Hgb A1c MFr Bld: 5.9 % — ABNORMAL HIGH (ref 4.8–5.6)
Mean Plasma Glucose: 122.63 mg/dL

## 2020-11-24 SURGERY — ECHOCARDIOGRAM, TRANSESOPHAGEAL
Anesthesia: Monitor Anesthesia Care

## 2020-11-24 MED ORDER — PHENYLEPHRINE 40 MCG/ML (10ML) SYRINGE FOR IV PUSH (FOR BLOOD PRESSURE SUPPORT)
PREFILLED_SYRINGE | INTRAVENOUS | Status: DC | PRN
  Administered 2020-11-24: 120 ug via INTRAVENOUS

## 2020-11-24 MED ORDER — NOREPINEPHRINE 4 MG/250ML-% IV SOLN
2.0000 ug/min | INTRAVENOUS | Status: DC
  Administered 2020-11-24 – 2020-11-28 (×4): 2 ug/min via INTRAVENOUS
  Filled 2020-11-24: qty 2500
  Filled 2020-11-24 (×3): qty 250

## 2020-11-24 MED ORDER — NOREPINEPHRINE 4 MG/250ML-% IV SOLN: 3.0000 ug/min | INTRAVENOUS | Status: DC

## 2020-11-24 MED ORDER — SODIUM CHLORIDE 0.9 % IV SOLN: INTRAVENOUS | Status: DC | PRN

## 2020-11-24 MED ORDER — SODIUM CHLORIDE 0.9 % IV SOLN: INTRAVENOUS | Status: DC

## 2020-11-24 MED ORDER — DEXMEDETOMIDINE (PRECEDEX) IN NS 20 MCG/5ML (4 MCG/ML) IV SYRINGE
PREFILLED_SYRINGE | INTRAVENOUS | Status: DC | PRN
  Administered 2020-11-24: 12 ug via INTRAVENOUS

## 2020-11-24 MED ORDER — PROPOFOL 10 MG/ML IV BOLUS
INTRAVENOUS | Status: DC | PRN
  Administered 2020-11-24: 30 mg via INTRAVENOUS
  Administered 2020-11-24: 20 mg via INTRAVENOUS
  Administered 2020-11-24: 30 mg via INTRAVENOUS

## 2020-11-24 MED ORDER — INSULIN ASPART 100 UNIT/ML IJ SOLN
0.0000 [IU] | Freq: Three times a day (TID) | INTRAMUSCULAR | Status: DC
Start: 2020-11-24 — End: 2020-12-18
  Administered 2020-11-24: 2 [IU] via SUBCUTANEOUS
  Administered 2020-11-25 – 2020-12-18 (×9): 1 [IU] via SUBCUTANEOUS

## 2020-11-24 NOTE — Progress Notes (Signed)
   11/24/20 1029  Clinical Encounter Type  Visited With Patient and family together  Visit Type Initial  Referral From Social work  Consult/Referral To [employer] responded to page from [employer] in Social Work. Chaplain provided AD education to Pt and Pt's daughter at bedside and answered their questions. Pt's husband was also present. Chaplain explained next steps and how to reach out to chaplain for notarization. Chaplain remains available.  This note was prepared by Chaplain Resident, Dante Gang, MDiv. Chaplain remains available as needed through the on-call pager: [phone].

## 2020-11-24 NOTE — Progress Notes (Addendum)
Advanced Heart Failure Rounding Note  PCP-Cardiologist: Shelva Majestic, MD   Subjective:   8/1 A fib RVR & A/C systolic heart faiure --> cardiogenic shock. Had urgent DC-CV with brief conversion to NSR but went back in A fib. . On Norepi + milrinone. AKI. Nephrology consulted.  8/2 A fib RVR given amio bolus x2 and continued on amio drip 60 mg per hour. Continue to diurese with lasix drip. Negative 1 liter. Continued on milrinone 0.375 mcg + norepi 2 mcg. Started on prednisone 40 mg daily x3 days for gout flare.  8/3 did not tolerate milrinone wean w/ drop in Co-ox 68%--->48% and decrease in UOP. Milrinone increased back to 0.375.  Lasix gtt increased to 30/hr.   Remains on milrinone 0.375. Off NE. Co-ox resulted 98%. Doubt accurate. Repeat pending.   On Lasix gtt at 30/hr. Only 2.7L in UOP yesterday. Wt only down 2 lb. CVP 14   SCr rising, 3.53>>3.81>>4.16 Na low 129  K 3.7   Remains in Afib HR 130s.   Sitting up on side of bed. Feels ok currently but did not sleep well last night due to SOB.   Objective:   Weight Range: 127.1 kg Body mass index is 48.86 kg/m.   Vital Signs:   Temp:  [97.3 F (36.3 C)-98 F (36.7 C)] 97.3 F (36.3 C) (08/04 0300) Pulse Rate:  [50-155] 69 (08/04 0700) Resp:  [18-30] 23 (08/04 0700) SpO2:  [91 %-99 %] 92 % (08/04 0700) Arterial Line BP: (84-128)/(57-81) 116/69 (08/04 0700) Weight:  [127.1 kg] 127.1 kg (08/04 0500) Last BM Date:  (PTA)  Weight change: Filed Weights   11/22/20 0500 11/23/20 0500 11/24/20 0500  Weight: 128.1 kg 128 kg 127.1 kg    Intake/Output:   Intake/Output Summary (Last 24 hours) at 11/24/2020 0730 Last data filed at 11/24/2020 0604 Gross per 24 hour  Intake 2454.58 ml  Output 2705 ml  Net -250.42 ml     Physical Exam   CVP 14  General:  Well appearing, obese, sitting up on side of bed. No respiratory difficulty HEENT: normal Neck: supple. JVD elevated to jaw. Carotids 2+ bilat; no bruits. No  lymphadenopathy or thyromegaly appreciated. Cor: PMI nondisplaced. Irregularly irregular rhythm, tachy rate. No rubs, gallops or murmurs. Lungs: decreased BS at the bases bilaterally, no wheezing  Abdomen: obese, soft, nontender, nondistended. No hepatosplenomegaly. No bruits or masses. Good bowel sounds. Extremities: no cyanosis, clubbing, rash, 2+ bilateral LE edema + unna boots  Neuro: alert & oriented x 3, cranial nerves grossly intact. moves all 4 extremities w/o difficulty. Affect pleasant.   Telemetry   Afib, 130s, personally reviewed.   EKG    N/A  Labs    CBC Recent Labs    11/23/20 0449 11/24/20 0502  WBC 6.9 7.5  HGB 12.8 12.0  HCT 37.9 36.4  MCV 93.8 96.0  PLT 253 825   Basic Metabolic Panel Recent Labs    11/22/20 1018 11/22/20 1400 11/23/20 0449 11/23/20 1310 11/24/20 0502  NA 130* 129* 131* 131* 129*  K 3.3* 3.5 3.6 4.4 3.7  CL 89* 89* 87* 88* 86*  CO2 25 25 29 30 26   GLUCOSE 179* 201* 142* 158* 189*  BUN 92* 91* 86* 88* 93*  CREATININE 3.78* 3.82* 3.53* 3.81* 4.16*  CALCIUM 9.6 9.4 9.8 9.4 10.0  MG 1.7  --  2.7*  --   --   PHOS  --  2.9  --  2.8  --  Liver Function Tests Recent Labs    11/23/20 0449 11/23/20 1310 11/24/20 0502  AST 74*  --  36  ALT 359*  --  245*  ALKPHOS 71  --  66  BILITOT 1.2  --  1.1  PROT 5.9*  --  5.8*  ALBUMIN 3.0* 3.0* 2.9*   No results for input(s): LIPASE, AMYLASE in the last 72 hours. Cardiac Enzymes No results for input(s): CKTOTAL, CKMB, CKMBINDEX, TROPONINI in the last 72 hours.  BNP: BNP (last 3 results) Recent Labs    11/20/20 0318 11/21/20 0258  BNP 2,051.3* 2,022.4*    ProBNP (last 3 results) No results for input(s): PROBNP in the last 8760 hours.   D-Dimer No results for input(s): DDIMER in the last 72 hours. Hemoglobin A1C No results for input(s): HGBA1C in the last 72 hours. Fasting Lipid Panel No results for input(s): CHOL, HDL, LDLCALC, TRIG, CHOLHDL, LDLDIRECT in the last 72  hours. Thyroid Function Tests Recent Labs    11/21/20 0826  T3FREE 2.6    Other results:   Imaging    No results found.   Medications:     Scheduled Medications:  calcitRIOL  0.25 mcg Oral Daily   Chlorhexidine Gluconate Cloth  6 each Topical Daily   clotrimazole  1 application Topical BID   estradiol  1 mg Oral Daily   febuxostat  40 mg Oral q AM   ferrous sulfate  325 mg Oral q morning   fluticasone  2 spray Each Nare Daily   fluticasone furoate-vilanterol  1 puff Inhalation Daily   gabapentin  100 mg Oral TID   levothyroxine  88 mcg Oral q morning   lidocaine  1 patch Transdermal Q24H   potassium chloride  40 mEq Oral Daily   predniSONE  30 mg Oral Q breakfast   [START ON 11/25/2020] predniSONE  5 mg Oral Q breakfast   sodium chloride flush  10-40 mL Intracatheter Q12H   sodium chloride flush  3 mL Intravenous Q12H    Infusions:  sodium chloride     amiodarone 60 mg/hr (11/24/20 0600)   furosemide (LASIX) 200 mg in dextrose 5% 100 mL (2mg /mL) infusion 30 mg/hr (11/24/20 0600)   heparin 1,300 Units/hr (11/24/20 0604)   milrinone 0.375 mcg/kg/min (11/24/20 0624)   norepinephrine (LEVOPHED) Adult infusion Stopped (11/23/20 0132)    PRN Medications: sodium chloride, albuterol, cyclobenzaprine, HYDROcodone-acetaminophen, ondansetron (ZOFRAN) IV, oxyCODONE, sodium chloride flush, sodium chloride flush    Patient Profile   Caitlyn Fuentes is a 65 year old with a history of chronic systolic hf previously EF 45%,  NICM, hypothyroid, PAF, gout, OSA, CKD Stage IV, HTN, hyperlipidemia, and lupus.   Admitted with A fib RVR and A/C systolic heart failure complicated by cardiogenic shock.     Assessment/Plan  Acute/Chronic Systolic Heart Failure Cardiogenic shock -8/1 Lactic acid 1.5. CO-OX 43%.--> milrinone increased 0.375 mcg + norepe 4 mcg. CO-OX repeated with improvement.  - Echo completed and showed EF has fallen from  30-35% in 2021  to 25-30%.  - Now off norepi.  On milrinone 0.375 mcg. Co-ox pending  - CVP 14. C/w lasix gtt at 30/hr and monitor renal fx - Hopefully diuresis will improve once NSR is restored - Supp K.   -No room for GDMT for now.   2. PAF--> Afib RVR - Developed shock and had urgent cardioversion 8/1 with conversion to NSR w/ ERAF - TSH elevated. Synthroid increased.   - Rate uncontrolled in 130s - Continue amio drip  60 mg per hour.  - On heparin drip, previously on eliquis. Last dose of eliquis 11/19/20 at 2315. - Plan TEE/DCCV today    3. AKI on CKD Stage IV -Recent Creatinine baseline 3.2 -> peaked at 4.2.  -Renal US with no acute findings. -Worsening renal function suspect in the setting of shock -Nonoliguric  -Continue inotropic support -Nephrology following    4. OSA Uses CPAP   5. Hypothyroidism -TSH 8. T3 ok and T4 2.3  -On synthroid. Dose increased to 88 mcg.  -Repeat TSH in 6 weeks.    6. Gout - Uric  Acid 10. Pain RLE ? Flare  - On uloric 40 mg daily  - 40 mg prednisone daily x 3 days.   7. Hypokalemia  - Supp K.   8. Deconditioning - Continue PT/OT.  - needs HHPT. ? SNF  9. Hypervolemic Hyponatremia - Na 129  - diuresis per above - if further drop to < 125, will need tolvaptan    Length of Stay: [address], PA-C  11/24/2020, 7:30 AM  Advanced Heart Failure Team Pager [phone] (M-F; 7a - 5p)  Please contact [facility] for night-coverage after hours (5p -7a ) and weekends on [email or web address]  Agree with above. Feels worse today. More SOB. Weak. Remains on milrinone 0.375 co-ox low CVP up to 14  Remains in AF with RVR despite amio gtt. SCr worse  General:  Lying in bed mild SOB HEENT: normal Neck: supple. SVP to ear Carotids 2+ bilat; no bruits. No lymphadenopathy or thryomegaly appreciated. Cor: PMI nondisplaced. Irregular tachy Lungs: clear Abdomen: obese soft, nontender, nondistended. No hepatosplenomegaly. No bruits or masses. Good bowel sounds. Extremities: no cyanosis,  clubbing, rash, 2-3+ edema Neuro: alert & orientedx3, cranial nerves grossly intact. moves all 4 extremities w/o difficulty. Affect pleasant  She is worse today with falling co-ox and worsening renal function in setting of recurrent AF.   Will restart low-dose NE. Continue IV diuresis. Plan TEE/DC-CV today.  I have d/w Nephrology. I think we may be headed toward HD.   CRITICAL CARE Performed by: Glori Bickers  Total critical care time: 35 minutes  Critical care time was exclusive of separately billable procedures and treating other patients.  Critical care was necessary to treat or prevent imminent or life-threatening deterioration.  Critical care was time spent personally by me (independent of midlevel providers or residents) on the following activities: development of treatment plan with patient and/or surrogate as well as nursing, discussions with consultants, evaluation of patient's response to treatment, examination of patient, obtaining history from patient or surrogate, ordering and performing treatments and interventions, ordering and review of laboratory studies, ordering and review of radiographic studies, pulse oximetry and re-evaluation of patient's condition. Glori Bickers, MD  1:47 PM

## 2020-11-24 NOTE — Progress Notes (Signed)
Rehab Admissions Coordinator Note:  Patient was screened by Cleatrice Burke for appropriateness for an Inpatient Acute Rehab Consult per therapy recs.  At this time, we are recommending Inpatient Rehab consult if patient fails to progress to be able to go home with [facility]. Please place rehab consult order if you would like [place] to assess for CIR.  Cleatrice Burke RN MSN 11/24/2020, 8:21 PM  I can be reached at [phone].

## 2020-11-24 NOTE — CV Procedure (Signed)
   TRANSESOPHAGEAL ECHOCARDIOGRAM GUIDED DIRECT CURRENT CARDIOVERSION  NAME:  Cosette Prindle   MRN: [id number] DOB:  05/29/1955   ADMIT DATE: 11/20/2020  INDICATIONS:  Atrial fibrillation  PROCEDURE:   Informed consent was obtained prior to the procedure. The risks, benefits and alternatives for the procedure were discussed and the patient comprehended these risks.  Risks include, but are not limited to, cough, sore throat, vomiting, nausea, somnolence, esophageal and stomach trauma or perforation, bleeding, low blood pressure, aspiration, pneumonia, infection, trauma to the teeth and death.    After a procedural time-out, the oropharynx was anesthetized and the patient was sedated by the anesthesia service. The transesophageal probe was inserted in the esophagus and stomach without difficulty and multiple views were obtained.   FINDINGS:  EF 15-20% RV severely HK. Moderate MR/TR. No LAA clot. (See [place] for full report)   CARDIOVERSION:     Indications:  Atrial Fibrillation  Procedure Details:  Once the TEE was complete, the patient had the defibrillator pads placed in the anterior and posterior position. Once an appropriate level of sedation was achieved, the patient received a single biphasic, synchronized 200J shock with prompt conversion to sinus rhythm. No apparent complications.   Glori Bickers, MD  11:12 PM

## 2020-11-24 NOTE — Anesthesia Procedure Notes (Signed)
Procedure Name: MAC Date/Time: 11/24/2020 1:12 PM Performed by: Kathryne Hitch, CRNA Pre-anesthesia Checklist: Patient identified, Emergency Drugs available, Suction available and Patient being monitored Patient Re-evaluated:Patient Re-evaluated prior to induction Oxygen Delivery Method: Simple face mask Preoxygenation: Pre-oxygenation with 100% oxygen Induction Type: IV induction

## 2020-11-24 NOTE — Progress Notes (Signed)
ANTICOAGULATION CONSULT NOTE - Follow Up Consult  Pharmacy Consult for heparin Indication: atrial fibrillation  Labs: Recent Labs    11/22/20 0447 11/22/20 1018 11/23/20 0449 11/23/20 1310 11/24/20 0502 11/24/20 1411  HGB 12.8  --  12.8  --  12.0  --   HCT 38.0  --  37.9  --  36.4  --   PLT 256  --  253  --  250  --   APTT 106*  --  64*  --  45* 65*  HEPARINUNFRC >1.10*  --  >1.10*  --  0.77*  --   CREATININE 3.82*   < > 3.53* 3.81* 4.16* 4.00*   < > = values in this interval not displayed.     Assessment: 65yo female on heparin for AF s/p DCCV this am. aPTT is slightly below goal at 65 seconds.  Goal of Therapy:  aPTT 66-102 seconds   Plan:  Increase heparin to 1400 units/h Recheck aPTT with am labs  Arrie Senate, PharmD, [place], [facility] Clinical Pharmacist [phone] Please check AMION for all [place] numbers 11/24/2020

## 2020-11-24 NOTE — Progress Notes (Signed)
ANTICOAGULATION CONSULT NOTE - Follow Up Consult  Pharmacy Consult for heparin Indication: atrial fibrillation  Labs: Recent Labs    11/22/20 0447 11/22/20 1018 11/22/20 1400 11/23/20 0449 11/23/20 1310 11/24/20 0502  HGB 12.8  --   --  12.8  --  12.0  HCT 38.0  --   --  37.9  --  36.4  PLT 256  --   --  253  --  250  APTT 106*  --   --  64*  --  45*  HEPARINUNFRC >1.10*  --   --  >1.10*  --  0.77*  CREATININE 3.82*   < > 3.82* 3.53* 3.81*  --    < > = values in this interval not displayed.    Assessment: 65yo female subtherapeutic on heparin with lower PTT despite increased rate yesterday am; no infusion issues or signs of bleeding per RN.  Goal of Therapy:  aPTT 66-102 seconds   Plan:  Will increase heparin infusion by 3 units/kgABW/hr to 1300 units/hr and check PTT in 8 hours.    Wynona Neat, PharmD, BCPS  11/24/2020,6:03 AM

## 2020-11-24 NOTE — Transfer of Care (Signed)
Immediate Anesthesia Transfer of Care Note  Patient: Caitlyn Fuentes  Procedure(s) Performed: TRANSESOPHAGEAL ECHOCARDIOGRAM (TEE) CARDIOVERSION  Patient Location: ICU  Anesthesia Type:General  Level of Consciousness: drowsy and patient cooperative  Airway & Oxygen Therapy: Patient Spontanous Breathing and Patient connected to face mask oxygen  Post-op Assessment: Report given to RN and Post -op Vital signs reviewed and stable  Post vital signs: Reviewed and stable  Last Vitals:  Vitals Value Taken Time  BP 101/66   Temp    Pulse 102 11/24/20 1334  Resp 29 11/24/20 1334  SpO2 97 % 11/24/20 1334  Vitals shown include unvalidated device data.  Last Pain:  Vitals:   11/24/20 1309  TempSrc: Oral  PainSc: 0-No pain      Patients Stated Pain Goal: 0 (62/19/47 1252)  Complications: No notable events documented.

## 2020-11-24 NOTE — Progress Notes (Signed)
RT note. Patient stated she will place self on cpap when ready for bed, cpap all set up and ready for use. RT will continue to monitor.

## 2020-11-24 NOTE — Plan of Care (Signed)
  Problem: Education: Goal: Ability to demonstrate management of disease process will improve Outcome: Not Progressing Goal: Ability to verbalize understanding of medication therapies will improve Outcome: Not Progressing Goal: Individualized Educational Video(s) Outcome: Not Applicable   Problem: Activity: Goal: Capacity to carry out activities will improve Outcome: Not Progressing   Problem: Coping: Goal: Level of anxiety will decrease Outcome: Not Progressing

## 2020-11-24 NOTE — Progress Notes (Addendum)
Physical Therapy Treatment Patient Details Name: Caitlyn Fuentes MRN: [id number] DOB: 01-25-1956 Today's Date: 11/24/2020    History of Present Illness Pt is a 65 y/o female presenting on 11/20/20 with worsening palpitations, SOB and LE edema. Pt found in rapid a fib, chest x-ray with significant vascular congestion. 8/1 hypotensive with cardiogenic shock, transferred to ICU and urgent cardioversion.   PMH includes: afib, cardiomyopathy, HTN, lupus, PAF, CKD.    PT Comments    Pt was tired post several procedures, but eager to mobilize.  Emphasis on transitions to EOB, sit to stand and progression of gait stability, stamina with monitoring of HR and Sats.  Pt's HR better controlled post cardioversion, but SOB continued after a moderate distance ambulated.  Pt needing more minimal assist today.  This patient is dissatisfied with her mobility level and wishes for a CIR consult for more intensity.  It's also important for her to be able to assist care-giving for her husband.    Follow Up Recommendations  CIR;Supervision/Assistance - 24 hour;Other (comment) (pt/dtr are very interested in the intensity of CIR)     Equipment Recommendations  Other (comment) (TBA)    Recommendations for Other Services Rehab consult     Precautions / Restrictions Precautions Precautions: Fall Precaution Comments: watch HR    Mobility  Bed Mobility Overal bed mobility: Needs Assistance Bed Mobility: Supine to Sit     Supine to sit: Min guard          Transfers Overall transfer level: Needs assistance   Transfers: Sit to/from Stand Sit to Stand: Min assist;Min guard (min from chair height x2)         General transfer comment: cues for hand placement.  assist to come forward and minor boost assist  Ambulation/Gait Ambulation/Gait assistance: Min guard;Min assist Gait Distance (Feet): 60 Feet (then 40 feet and 20 feet to chair.) Assistive device: Rolling walker (2 wheeled) Gait  Pattern/deviations: Step-through pattern   Gait velocity interpretation: <1.8 ft/sec, indicate of risk for recurrent falls General Gait Details: pt generally steady with the RW, HR stayed lower in the 100's and low 110's, SpO2 dropped into the lower 90's on RA, but as pt became dyspneic 2-3/4 and more fatigued, sats dropped down to the 85-88% range, pt sat and quickly recovered from SOB and lower sats.   Stairs             Wheelchair Mobility    Modified Rankin (Stroke Patients Only)       Balance Overall balance assessment: Mild deficits observed, not formally tested;Needs assistance Sitting-balance support: No upper extremity supported;Single extremity supported;Feet supported Sitting balance-Leahy Scale: Fair       Standing balance-Leahy Scale: Poor Standing balance comment: reliant on AD or external support at this point.                            Cognition Arousal/Alertness: Awake/alert Behavior During Therapy: WFL for tasks assessed/performed Overall Cognitive Status: Within Functional Limits for tasks assessed                                        Exercises      General Comments General comments (skin integrity, edema, etc.): pt just underwent cardioversion and TEE earlier in the day.  HR was much better controlled and though pt fared okay during the session, she was more  fatigued, needed several rest breaks and more assist.  Pt rebounded decently.      Pertinent Vitals/Pain Pain Assessment: No/denies pain    Home Living                      Prior Function            PT Goals (current goals can now be found in the care plan section) Acute Rehab PT Goals Patient Stated Goal: to get better PT Goal Formulation: With patient Time For Goal Achievement: 12/07/20 Potential to Achieve Goals: Good Progress towards PT goals: Progressing toward goals    Frequency    Min 3X/week      PT Plan Discharge plan needs  to be updated    Co-evaluation              AM-PAC PT "6 Clicks" Mobility   Outcome Measure  Help needed turning from your back to your side while in a flat bed without using bedrails?: A Little Help needed moving from lying on your back to sitting on the side of a flat bed without using bedrails?: A Little Help needed moving to and from a bed to a chair (including a wheelchair)?: A Little Help needed standing up from a chair using your arms (e.g., wheelchair or bedside chair)?: A Little Help needed to walk in hospital room?: A Little Help needed climbing 3-5 steps with a railing? : A Lot 6 Click Score: 17    End of Session   Activity Tolerance: Patient tolerated treatment well;Patient limited by fatigue Patient left: in chair;with call bell/phone within reach;with family/visitor present   PT Visit Diagnosis: Other abnormalities of gait and mobility (R26.89);Muscle weakness (generalized) (M62.81);Unsteadiness on feet (R26.81)     Time: 0254-2706 PT Time Calculation (min) (ACUTE ONLY): 37 min  Charges:  $Gait Training: 8-22 mins $Therapeutic Activity: 8-22 mins                     11/24/2020  [name], PT Acute Rehabilitation Services [phone]  (pager) [phone]  (office)   [name] Yossi Hinchman 11/24/2020, 6:51 PM

## 2020-11-24 NOTE — Anesthesia Preprocedure Evaluation (Addendum)
Anesthesia Evaluation  Patient identified by MRN, date of birth, ID band Patient awake    Reviewed: Allergy & Precautions, NPO status , Patient's Chart, lab work & pertinent test results  Airway Mallampati: III  TM Distance: >3 FB Neck ROM: Full    Dental  (+) Teeth Intact   Pulmonary sleep apnea and Continuous Positive Airway Pressure Ventilation , COPD,  COPD inhaler,     + decreased breath sounds      Cardiovascular hypertension, Pt. on medications and Pt. on home beta blockers +CHF  + dysrhythmias Atrial Fibrillation  Rhythm:Irregular Rate:Normal  pHTN   Neuro/Psych negative neurological ROS  negative psych ROS   GI/Hepatic negative GI ROS, Neg liver ROS,   Endo/Other  Hypothyroidism   Renal/GU CRFRenal disease  negative genitourinary   Musculoskeletal negative musculoskeletal ROS (+)   Abdominal (+) + obese,   Peds  Hematology negative hematology ROS (+)   Anesthesia Other Findings   Reproductive/Obstetrics                           Anesthesia Physical Anesthesia Plan  ASA: 4  Anesthesia Plan: MAC   Post-op Pain Management:    Induction: Intravenous  PONV Risk Score and Plan: 2 and Propofol infusion and Treatment may vary due to age or medical condition  Airway Management Planned: Simple Face Mask, Natural Airway and Nasal Cannula  Additional Equipment: None  Intra-op Plan:   Post-operative Plan:   Informed Consent: I have reviewed the patients History and Physical, chart, labs and discussed the procedure including the risks, benefits and alternatives for the proposed anesthesia with the patient or authorized representative who has indicated his/her understanding and acceptance.     Dental advisory given  Plan Discussed with: CRNA  Anesthesia Plan Comments: (Lab Results      Component                Value               Date                      WBC                       7.5                 11/24/2020                HGB                      12.0                11/24/2020                HCT                      36.4                11/24/2020                MCV                      96.0                11/24/2020                PLT  250                 11/24/2020           Lab Results      Component                Value               Date                      NA                       129 (L)             11/24/2020                K                        3.7                 11/24/2020                CO2                      26                  11/24/2020                GLUCOSE                  189 (H)             11/24/2020                BUN                      93 (H)              11/24/2020                CREATININE               4.16 (H)            11/24/2020                CALCIUM                  10.0                11/24/2020                GFRNONAA                 11 (L)              11/24/2020                GFRAA                    37 (L)              03/09/2020           ECHO 11/21/20: 1. Left ventricular ejection fraction, by estimation, is 25 to 30%. The  left ventricle has severely decreased function. The left ventricle  demonstrates global hypokinesis. There is moderate left ventricular  hypertrophy. Left ventricular diastolic  parameters are indeterminate. Apex not well visualized, consider limited  echo with contrast to evaluate for apical thrombus  given severe systolic  dysfunction.  2. Right ventricular systolic function is mildly reduced. The right  ventricular size is normal. There is mildly elevated pulmonary artery  systolic pressure. The estimated right ventricular systolic pressure is  83.3 mmHg.  3. The mitral valve is normal in structure. Moderate mitral valve  regurgitation.  4. The aortic valve was not well visualized. Aortic valve regurgitation  is not visualized. AV gradients were not measured.  5.  The inferior vena cava is dilated in size with <50% respiratory  variability, suggesting right atrial pressure of 15 mmHg. )      Anesthesia Quick Evaluation

## 2020-11-25 ENCOUNTER — Encounter (HOSPITAL_COMMUNITY): Payer: Self-pay | Admitting: Internal Medicine

## 2020-11-25 DIAGNOSIS — R570 Cardiogenic shock: Secondary | ICD-10-CM | POA: Diagnosis not present

## 2020-11-25 DIAGNOSIS — I4891 Unspecified atrial fibrillation: Secondary | ICD-10-CM | POA: Diagnosis not present

## 2020-11-25 LAB — COMPREHENSIVE METABOLIC PANEL
ALT: 179 U/L — ABNORMAL HIGH (ref 0–44)
AST: 25 U/L (ref 15–41)
Albumin: 2.9 g/dL — ABNORMAL LOW (ref 3.5–5.0)
Alkaline Phosphatase: 63 U/L (ref 38–126)
Anion gap: 14 (ref 5–15)
BUN: 97 mg/dL — ABNORMAL HIGH (ref 8–23)
CO2: 30 mmol/L (ref 22–32)
Calcium: 9.6 mg/dL (ref 8.9–10.3)
Chloride: 88 mmol/L — ABNORMAL LOW (ref 98–111)
Creatinine, Ser: 4.1 mg/dL — ABNORMAL HIGH (ref 0.44–1.00)
GFR, Estimated: 12 mL/min — ABNORMAL LOW (ref >60)
Glucose, Bld: 135 mg/dL — ABNORMAL HIGH (ref 70–99)
Potassium: 3.6 mmol/L (ref 3.5–5.1)
Sodium: 132 mmol/L — ABNORMAL LOW (ref 135–145)
Total Bilirubin: 1 mg/dL (ref 0.3–1.2)
Total Protein: 5.7 g/dL — ABNORMAL LOW (ref 6.5–8.1)

## 2020-11-25 LAB — GLUCOSE, CAPILLARY
Glucose-Capillary: 143 mg/dL — ABNORMAL HIGH (ref 70–99)
Glucose-Capillary: 142 mg/dL — ABNORMAL HIGH (ref 70–99)
Glucose-Capillary: 172 mg/dL — ABNORMAL HIGH (ref 70–99)
Glucose-Capillary: 141 mg/dL — ABNORMAL HIGH (ref 70–99)

## 2020-11-25 LAB — COOXEMETRY PANEL
Carboxyhemoglobin: 1.5 % (ref 0.5–1.5)
Methemoglobin: 0.9 % (ref 0.0–1.5)
O2 Saturation: 68.3 %
Total hemoglobin: 12 g/dL (ref 12.0–16.0)

## 2020-11-25 LAB — BASIC METABOLIC PANEL
Anion gap: 14 (ref 5–15)
BUN: 96 mg/dL — ABNORMAL HIGH (ref 8–23)
CO2: 31 mmol/L (ref 22–32)
Calcium: 9.8 mg/dL (ref 8.9–10.3)
Chloride: 88 mmol/L — ABNORMAL LOW (ref 98–111)
Creatinine, Ser: 4.13 mg/dL — ABNORMAL HIGH (ref 0.44–1.00)
GFR, Estimated: 11 mL/min — ABNORMAL LOW (ref >60)
Glucose, Bld: 135 mg/dL — ABNORMAL HIGH (ref 70–99)
Potassium: 3.6 mmol/L (ref 3.5–5.1)
Sodium: 133 mmol/L — ABNORMAL LOW (ref 135–145)

## 2020-11-25 LAB — CBC
HCT: 35.4 % — ABNORMAL LOW (ref 36.0–46.0)
Hemoglobin: 12 g/dL (ref 12.0–15.0)
MCH: 32.5 pg (ref 26.0–34.0)
MCHC: 33.9 g/dL (ref 30.0–36.0)
MCV: 95.9 fL (ref 80.0–100.0)
Platelets: 237 10*3/uL (ref 150–400)
RBC: 3.69 MIL/uL — ABNORMAL LOW (ref 3.87–5.11)
RDW: 13.9 % (ref 11.5–15.5)
WBC: 9.4 10*3/uL (ref 4.0–10.5)
nRBC: 0.3 % — ABNORMAL HIGH (ref 0.0–0.2)

## 2020-11-25 LAB — APTT: aPTT: 66 seconds — ABNORMAL HIGH (ref 24–36)

## 2020-11-25 LAB — MAGNESIUM: Magnesium: 2 mg/dL (ref 1.7–2.4)

## 2020-11-25 LAB — HEPARIN LEVEL (UNFRACTIONATED): Heparin Unfractionated: 0.78 IU/mL — ABNORMAL HIGH (ref 0.30–0.70)

## 2020-11-25 MED ORDER — POTASSIUM CHLORIDE 10 MEQ/50ML IV SOLN
10.0000 meq | INTRAVENOUS | Status: AC
  Administered 2020-11-25 (×2): 10 meq via INTRAVENOUS
  Filled 2020-11-25: qty 50

## 2020-11-25 MED ORDER — AMIODARONE IV BOLUS ONLY 150 MG/100ML
150.0000 mg | Freq: Once | INTRAVENOUS | Status: AC
  Administered 2020-11-25: 150 mg via INTRAVENOUS
  Filled 2020-11-25: qty 100

## 2020-11-25 MED ORDER — AMIODARONE LOAD VIA INFUSION
150.0000 mg | Freq: Once | INTRAVENOUS | Status: AC
  Administered 2020-11-25: 150 mg via INTRAVENOUS

## 2020-11-25 MED ORDER — POTASSIUM CHLORIDE CRYS ER 20 MEQ PO TBCR
40.0000 meq | EXTENDED_RELEASE_TABLET | Freq: Once | ORAL | Status: AC
  Administered 2020-11-25: 40 meq via ORAL
  Filled 2020-11-25: qty 2

## 2020-11-25 MED ORDER — AMIODARONE LOAD VIA INFUSION
150.0000 mg | Freq: Once | INTRAVENOUS | Status: AC
  Administered 2020-11-25: 150 mg via INTRAVENOUS
  Filled 2020-11-25: qty 83.34

## 2020-11-25 NOTE — Progress Notes (Signed)
Physical Therapy Treatment Patient Details Name: Caitlyn Fuentes MRN: [id number] DOB: Nov 25, 1955 Today's Date: 11/25/2020    History of Present Illness Pt is a 65 y/o female presenting on 11/20/20 with worsening palpitations, SOB and LE edema. Pt found in rapid a fib, chest x-ray with significant vascular congestion. 8/1 hypotensive with cardiogenic shock, transferred to ICU and urgent cardioversion.   S/P TEE and cardioversion 8/4. PMH includes: afib, cardiomyopathy, HTN, lupus, PAF, CKD.    PT Comments    Pt upset that her heart fell back into afib.  Not feeling as well today, but willing to participate.  Emphasis on scooting to edge of chair to prep for standing, sit to stands from different chair heights, progression of gait with the RW with monitoring of HR and SpO2.    Follow Up Recommendations  CIR;Supervision/Assistance - 24 hour;Other (comment)     Equipment Recommendations  Other (comment) (TBA)    Recommendations for Other Services Rehab consult     Precautions / Restrictions Precautions Precautions: Fall Precaution Comments: watch HR    Mobility  Bed Mobility               General bed mobility comments: OOB on arrival, left in chair.    Transfers Overall transfer level: Needs assistance   Transfers: Sit to/from Stand Sit to Stand: Min assist         General transfer comment: cuing for hand placement, assist forward and boosting,  more stability assist as she got tired/fatigued.  Ambulation/Gait Ambulation/Gait assistance: Min assist Gait Distance (Feet): 35 Feet (x3) Assistive device: Rolling walker (2 wheeled) Gait Pattern/deviations: Step-through pattern   Gait velocity interpretation: <1.8 ft/sec, indicate of risk for recurrent falls General Gait Details: mildly unsteady today, more flexed of posture, SOB coming on quicker, but safs maintained in low 90's on 2L [place], HR again in afib at 130's to 140's   Stairs             Wheelchair  Mobility    Modified Rankin (Stroke Patients Only)       Balance Overall balance assessment: Needs assistance Sitting-balance support: No upper extremity supported;Feet supported Sitting balance-Leahy Scale: Fair     Standing balance support: Bilateral upper extremity supported;During functional activity Standing balance-Leahy Scale: Poor Standing balance comment: BUE support                            Cognition Arousal/Alertness: Awake/alert Behavior During Therapy: WFL for tasks assessed/performed Overall Cognitive Status: Within Functional Limits for tasks assessed                                        Exercises      General Comments        Pertinent Vitals/Pain Pain Assessment: No/denies pain    Home Living                      Prior Function            PT Goals (current goals can now be found in the care plan section) Acute Rehab PT Goals Patient Stated Goal: to get better PT Goal Formulation: With patient Time For Goal Achievement: 12/07/20 Potential to Achieve Goals: Good Progress towards PT goals: Progressing toward goals    Frequency    Min 3X/week      PT Plan  Current plan remains appropriate    Co-evaluation              AM-PAC PT "6 Clicks" Mobility   Outcome Measure  Help needed turning from your back to your side while in a flat bed without using bedrails?: A Little Help needed moving from lying on your back to sitting on the side of a flat bed without using bedrails?: A Little Help needed moving to and from a bed to a chair (including a wheelchair)?: A Little Help needed standing up from a chair using your arms (e.g., wheelchair or bedside chair)?: A Little Help needed to walk in hospital room?: A Little Help needed climbing 3-5 steps with a railing? : A Lot 6 Click Score: 17    End of Session Equipment Utilized During Treatment: Oxygen Activity Tolerance: Patient tolerated treatment  well;Patient limited by fatigue Patient left: in chair;with call bell/phone within reach;with family/visitor present Nurse Communication: Mobility status PT Visit Diagnosis: Other abnormalities of gait and mobility (R26.89);Muscle weakness (generalized) (M62.81);Unsteadiness on feet (R26.81)     Time: 1586-8257 PT Time Calculation (min) (ACUTE ONLY): 29 min  Charges:  $Gait Training: 8-22 mins $Therapeutic Activity: 8-22 mins                     11/25/2020  [name], PT Acute Rehabilitation Services [phone]  (pager) [phone]  (office)   [name] Nitika Jackowski 11/25/2020, 4:42 PM

## 2020-11-25 NOTE — Progress Notes (Signed)
Occupational Therapy Treatment Patient Details Name: Caitlyn Fuentes MRN: [id number] DOB: 13-May-1955 Today's Date: 11/25/2020    History of present illness Pt is a 65 y/o female presenting on 11/20/20 with worsening palpitations, SOB and LE edema. Pt found in rapid a fib, chest x-ray with significant vascular congestion. 8/1 hypotensive with cardiogenic shock, transferred to ICU and urgent cardioversion.   S/P TEE and cardioversion 8/4. PMH includes: afib, cardiomyopathy, HTN, lupus, PAF, CKD.   OT comments  Patient seated in recliner, just finished with PT.  Short distance functional mobility using RW to sink with min assist, seated at sink to engage in oral care with setup assist.  While brushing teeth, HR with brief episode up to 248 with RN present; assisted pt back to bed with min assist. On 2L supplemental O2 during session, in afib with HR 125-140. Will follow acutely, updated dc plan to CIR.    Follow Up Recommendations  CIR    Equipment Recommendations  Other (comment) (TBD)    Recommendations for Other Services Rehab consult    Precautions / Restrictions Precautions Precautions: Fall Precaution Comments: watch HR Restrictions Weight Bearing Restrictions: No       Mobility Bed Mobility Overal bed mobility: Needs Assistance Bed Mobility: Sit to Supine       Sit to supine: Min assist   General bed mobility comments: min assist to fully bring LEs back to supine    Transfers Overall transfer level: Needs assistance   Transfers: Sit to/from Stand;Stand Pivot Transfers Sit to Stand: Min assist Stand pivot transfers: Min assist       General transfer comment: pt with cueing for hand placement and safety, min assist to fully power up    Balance Overall balance assessment: Needs assistance Sitting-balance support: No upper extremity supported;Feet supported Sitting balance-Leahy Scale: Fair     Standing balance support: Bilateral upper extremity  supported;During functional activity Standing balance-Leahy Scale: Poor Standing balance comment: BUE support                           ADL either performed or assessed with clinical judgement   ADL Overall ADL's : Needs assistance/impaired     Grooming: Set up;Sitting Grooming Details (indicate cue type and reason): func mobility to sink using RW, min assist; seated at sink for oral care with setup assist                 Toilet Transfer: Minimal assistance;Ambulation;RW Toilet Transfer Details (indicate cue type and reason): simulated in room         Functional mobility during ADLs: Minimal assistance;Rolling walker;Cueing for safety General ADL Comments: pt limited by HR, decreased activity tolerance, SOB and body habitus     Vision       Perception     Praxis      Cognition Arousal/Alertness: Awake/alert Behavior During Therapy: WFL for tasks assessed/performed Overall Cognitive Status: Within Functional Limits for tasks assessed                                          Exercises     Shoulder Instructions       General Comments HR 125-140, seated at sink up to 248 briefly with RN present limiting session and returned back to bed; on 2L supplemental O2 today with SOB with minimal activity    Pertinent  Vitals/ Pain       Pain Assessment: No/denies pain  Home Living                                          Prior Functioning/Environment              Frequency  Min 2X/week        Progress Toward Goals  OT Goals(current goals can now be found in the care plan section)  Progress towards OT goals: Progressing toward goals  Acute Rehab OT Goals Patient Stated Goal: to get better OT Goal Formulation: With patient  Plan Frequency remains appropriate;Discharge plan needs to be updated    Co-evaluation                 AM-PAC OT "6 Clicks" Daily Activity     Outcome Measure   Help from  another person eating meals?: None Help from another person taking care of personal grooming?: A Little Help from another person toileting, which includes using toliet, bedpan, or urinal?: A Lot Help from another person bathing (including washing, rinsing, drying)?: A Lot Help from another person to put on and taking off regular upper body clothing?: A Little Help from another person to put on and taking off regular lower body clothing?: A Lot 6 Click Score: 16    End of Session Equipment Utilized During Treatment: Rolling walker;Oxygen  OT Visit Diagnosis: Other abnormalities of gait and mobility (R26.89);Muscle weakness (generalized) (M62.81)   Activity Tolerance Treatment limited secondary to medical complications (Comment) (elevated HR)   Patient Left in bed;with call bell/phone within reach;with nursing/sitter in room   Nurse Communication Mobility status        Time: 6270-3500 OT Time Calculation (min): 27 min  Charges: OT General Charges $OT Visit: 1 Visit OT Treatments $Self Care/Home Management : 23-37 mins  Jolaine Artist, OT Acute Rehabilitation Services Pager [phone] Office [phone]    Caitlyn Fuentes 11/25/2020, 12:50 PM

## 2020-11-25 NOTE — Progress Notes (Signed)
ANTICOAGULATION CONSULT NOTE - Follow-Up Consult  Pharmacy Consult for Heparin Indication: atrial fibrillation  Patient Measurements: Height: 5' 3.5" (161.3 cm) Weight: 125 kg (275 lb 9.2 oz) IBW/kg (Calculated) : 53.55 Heparin Dosing Weight: 85 kg  Vital Signs: Temp: 97.7 F (36.5 C) (08/05 0730) Temp Source: Oral (08/05 0730) Pulse Rate: 120 (08/05 0700)  Labs: Recent Labs    11/23/20 0449 11/23/20 1310 11/24/20 0502 11/24/20 1411 11/25/20 0011 11/25/20 0419  HGB 12.8  --  12.0  --   --  12.0  HCT 37.9  --  36.4  --   --  35.4*  PLT 253  --  250  --   --  237  APTT 64*  --  45* 65*  --  66*  HEPARINUNFRC >1.10*  --  0.77*  --   --  0.78*  CREATININE 3.53*   < > 4.16* 4.00* 4.13* 4.10*   < > = values in this interval not displayed.     Estimated Creatinine Clearance: 18 mL/min (A) (by C-G formula based on SCr of 4.1 mg/dL (H)).   Medical History: Past Medical History:  Diagnosis Date   A-fib ([place])    Cardiomyopathy    Hyperlipidemia    Hypertension    Lupus ([place])    Pulmonary hypertension ([place]) 05/10/2011   Echo, [id number]   Renal disorder    Sleep apnea 2008   CPAP, pt does not know settings   Thyroid disease     Medications:   calcitRIOL  0.25 mcg Oral Daily   Chlorhexidine Gluconate Cloth  6 each Topical Daily   clotrimazole  1 application Topical BID   estradiol  1 mg Oral Daily   febuxostat  40 mg Oral q AM   ferrous sulfate  325 mg Oral q morning   fluticasone  2 spray Each Nare Daily   fluticasone furoate-vilanterol  1 puff Inhalation Daily   gabapentin  100 mg Oral TID   insulin aspart  0-6 Units Subcutaneous TID WC   levothyroxine  88 mcg Oral q morning   lidocaine  1 patch Transdermal Q24H   potassium chloride  40 mEq Oral Daily   predniSONE  5 mg Oral Q breakfast   sodium chloride flush  10-40 mL Intracatheter Q12H   sodium chloride flush  3 mL Intravenous Q12H     Assessment: 65 y.o. female with  h/o Afib, DOAC on hold, for  heparin.  Pt was on Xarelto PTA and changed to Eliquis due to ARF.  Eliquis 5 mg last given at 2317 7/31.   aPTT therapeutic at 66 seconds, CBC stable, heparin level still altered by DOAC use in setting of renal failure. Pt s/p DCCV to NSR 8/4.  Goal of Therapy:  Heparin level 0.3-0.7 units/ml aPTT 66-102 seconds Monitor platelets by anticoagulation protocol: Yes   Plan:  -Increase heparin to 1500 units/h -Recheck aPTT and heparin level with daily labs   Arrie Senate, PharmD, BCPS, [facility] Clinical Pharmacist [phone] Please check AMION for all [place] numbers 11/25/2020

## 2020-11-25 NOTE — Anesthesia Postprocedure Evaluation (Signed)
Anesthesia Post Note  Patient: Caitlyn Fuentes  Procedure(s) Performed: TRANSESOPHAGEAL ECHOCARDIOGRAM (TEE) CARDIOVERSION     Patient location during evaluation: SICU Anesthesia Type: MAC Level of consciousness: awake Pain management: pain level controlled Vital Signs Assessment: post-procedure vital signs reviewed and stable Respiratory status: patient connected to face mask oxygen Cardiovascular status: stable Postop Assessment: no apparent nausea or vomiting Anesthetic complications: no   No notable events documented.  Last Vitals:  Vitals:   11/25/20 1015 11/25/20 1124  BP:    Pulse: (!) 109   Resp: 19   Temp:  (!) 36.4 C  SpO2: 96%     Last Pain:  Vitals:   11/25/20 1124  TempSrc: Oral  PainSc:                  March Rummage Lether Tesch

## 2020-11-25 NOTE — Progress Notes (Addendum)
Advanced Heart Failure Rounding Note  PCP-Cardiologist: Shelva Majestic, MD   Subjective:   - 8/1 A fib RVR & A/C systolic heart faiure --> cardiogenic shock. Had urgent DC-CV with brief conversion to NSR but went back in A fib. . On Norepi + milrinone. AKI. Nephrology consulted.  - 8/2 A fib RVR given amio bolus x2 and continued on amio drip 60 mg per hour. Continue to diurese with lasix drip. Negative 1 liter. Continued on milrinone 0.375 mcg + norepi 2 mcg. Started on prednisone 40 mg daily x3 days for gout flare.  - 8/3 did not tolerate milrinone wean w/ drop in Co-ox 68%--->48% and decrease in UOP. Milrinone increased back to 0.375.  Lasix gtt increased to 30/hr.  - 8/4 NE added back given persistently low Co-ox - 8/4 s/p TEE/DCCV>>NSR  - 8/5 back in Afib w/ RVR   Unfortunately, she went back into Afib w/ RVR around midnight. Rate currently in the 120s. On amio gtt at 60/hr.   Remains on milrinone 0.375 + NE 2.  Co-ox  better 49>>68%. SCr has plateaued at 4.1.    On Lasix gtt at 30/hr. UOP picking up, 3.5 L out yesterday. Wt down 5 lb but remains markedly fluid overloaded. CVP 18.   OOB, sitting up in chair. Feels tired today. Mild SOB last night. No dyspnea currently sitting in chair.    Objective:   Weight Range: 125 kg Body mass index is 48.05 kg/m.   Vital Signs:   Temp:  [97.4 F (36.3 C)-98.4 F (36.9 C)] 97.7 F (36.5 C) (08/05 0730) Pulse Rate:  [70-150] 125 (08/05 0800) Resp:  [15-33] 22 (08/05 0800) BP: (122)/(84) 122/84 (08/04 1309) SpO2:  [87 %-99 %] 93 % (08/05 0800) Arterial Line BP: (72-140)/(60-91) 90/61 (08/05 0800) Weight:  [125 kg] 125 kg (08/05 0500) Last BM Date:  (PTA)  Weight change: Filed Weights   11/23/20 0500 11/24/20 0500 11/25/20 0500  Weight: 128 kg 127.1 kg 125 kg    Intake/Output:   Intake/Output Summary (Last 24 hours) at 11/25/2020 0836 Last data filed at 11/25/2020 0800 Gross per 24 hour  Intake 2244.62 ml  Output 3505 ml   Net -1260.38 ml     Physical Exam   CVP 18  General:  Well appearing sitting up in chair, obese, No respiratory difficulty HEENT: normal + left IJ CVC  Neck: supple. Thick and short neck, JVD not well visualized . Carotids 2+ bilat; no bruits. No lymphadenopathy or thyromegaly appreciated. Cor: PMI nondisplaced. Irregularly irregular rhythm tachy rate. No rubs, gallops or murmurs. Lungs: decreased BS at the bases  Abdomen: obese, soft, nontender, nondistended. No hepatosplenomegaly. No bruits or masses. Good bowel sounds. Extremities: no cyanosis, clubbing, rash, 2+ bilateral LE edema + unna boots  Neuro: alert & oriented x 3, cranial nerves grossly intact. moves all 4 extremities w/o difficulty. Affect pleasant.  Telemetry   Afib, 120s, personally reviewed.   EKG    N/A  Labs    CBC Recent Labs    11/24/20 0502 11/25/20 0419  WBC 7.5 9.4  HGB 12.0 12.0  HCT 36.4 35.4*  MCV 96.0 95.9  PLT 250 629   Basic Metabolic Panel Recent Labs    11/23/20 0449 11/23/20 1310 11/24/20 0502 11/24/20 1411 11/25/20 0011 11/25/20 0419  NA 131* 131*   < > 131* 133* 132*  K 3.6 4.4   < > 3.9 3.6 3.6  CL 87* 88*   < > 89* 88* 88*  CO2 29 30   < > 29 31 30   GLUCOSE 142* 158*   < > 169* 135* 135*  BUN 86* 88*   < > 93* 96* 97*  CREATININE 3.53* 3.81*   < > 4.00* 4.13* 4.10*  CALCIUM 9.8 9.4   < > 9.4 9.8 9.6  MG 2.7*  --   --   --  2.0  --   PHOS  --  2.8  --  3.3  --   --    < > = values in this interval not displayed.   Liver Function Tests Recent Labs    11/24/20 0502 11/24/20 1411 11/25/20 0419  AST 36  --  25  ALT 245*  --  179*  ALKPHOS 66  --  63  BILITOT 1.1  --  1.0  PROT 5.8*  --  5.7*  ALBUMIN 2.9* 2.9* 2.9*   No results for input(s): LIPASE, AMYLASE in the last 72 hours. Cardiac Enzymes No results for input(s): CKTOTAL, CKMB, CKMBINDEX, TROPONINI in the last 72 hours.  BNP: BNP (last 3 results) Recent Labs    11/20/20 0318 11/21/20 0258  BNP  2,051.3* 2,022.4*    ProBNP (last 3 results) No results for input(s): PROBNP in the last 8760 hours.   D-Dimer No results for input(s): DDIMER in the last 72 hours. Hemoglobin A1C Recent Labs    11/24/20 0502  HGBA1C 5.9*   Fasting Lipid Panel No results for input(s): CHOL, HDL, LDLCALC, TRIG, CHOLHDL, LDLDIRECT in the last 72 hours. Thyroid Function Tests No results for input(s): TSH, T4TOTAL, T3FREE, THYROIDAB in the last 72 hours.  Invalid input(s): FREET3   Other results:   Imaging    ECHO TEE  Result Date: 11/24/2020    TRANSESOPHOGEAL ECHO REPORT   Patient Name:   Caitlyn Fuentes Date of Exam: 11/24/2020 Medical Rec #:  [id number]         Height:       63.5 in Accession #:    [id number]        Weight:       280.2 lb Date of Birth:  08/27/1955         BSA:          2.245 m Patient Age:    65 years          BP:           124/81 mmHg Patient Gender: F                 HR:           127 bpm. Exam Location:  Inpatient Procedure: Transesophageal Echo, Cardiac Doppler, Color Doppler and 2D Echo Indications:     Cardioversion  History:         Patient has prior history of Echocardiogram examinations, most                  recent 12/25/2018.  Sonographer:     Merrie Roof RDCS Referring Phys:  [id number] Shaune Pascal Lenetta Piche Diagnosing Phys: Glori Bickers MD PROCEDURE: The transesophogeal probe was passed without difficulty through the esophogus of the patient. Sedation performed by different physician. The patient's vital signs; including heart rate, blood pressure, and oxygen saturation; remained stable throughout the procedure. The patient developed no complications during the procedure. IMPRESSIONS  1. Left ventricular ejection fraction, by estimation, is <20%. The left ventricle has severely decreased function. The left ventricle demonstrates global hypokinesis. The left ventricular internal cavity size was  moderately dilated.  2. Right ventricular systolic function is severely reduced. The  right ventricular size is normal.  3. Left atrial size was severely dilated. No left atrial/left atrial appendage thrombus was detected.  4. Right atrial size was moderately dilated.  5. The mitral valve is normal in structure. Moderate mitral valve regurgitation.  6. Tricuspid valve regurgitation is moderate.  7. The aortic valve is tricuspid. Aortic valve regurgitation is not visualized.  8. There is mild (Grade II) plaque involving the descending aorta. FINDINGS  Left Ventricle: Left ventricular ejection fraction, by estimation, is <20%. The left ventricle has severely decreased function. The left ventricle demonstrates global hypokinesis. The left ventricular internal cavity size was moderately dilated. Right Ventricle: The right ventricular size is normal. No increase in right ventricular wall thickness. Right ventricular systolic function is severely reduced. Left Atrium: Left atrial size was severely dilated. No left atrial/left atrial appendage thrombus was detected. Right Atrium: Right atrial size was moderately dilated. Pericardium: There is no evidence of pericardial effusion. Mitral Valve: The mitral valve is normal in structure. Moderate mitral valve regurgitation. Tricuspid Valve: The tricuspid valve is normal in structure. Tricuspid valve regurgitation is moderate. Aortic Valve: The aortic valve is tricuspid. Aortic valve regurgitation is not visualized. Pulmonic Valve: The pulmonic valve was grossly normal. Pulmonic valve regurgitation is trivial. Aorta: The aortic root is normal in size and structure. There is mild (Grade II) plaque involving the descending aorta. IAS/Shunts: No atrial level shunt detected by color flow Doppler. Glori Bickers MD Electronically signed by Glori Bickers MD Signature Date/Time: 11/24/2020/4:56:36 PM    Final      Medications:     Scheduled Medications:  calcitRIOL  0.25 mcg Oral Daily   Chlorhexidine Gluconate Cloth  6 each Topical Daily   clotrimazole  1  application Topical BID   estradiol  1 mg Oral Daily   febuxostat  40 mg Oral q AM   ferrous sulfate  325 mg Oral q morning   fluticasone  2 spray Each Nare Daily   fluticasone furoate-vilanterol  1 puff Inhalation Daily   gabapentin  100 mg Oral TID   insulin aspart  0-6 Units Subcutaneous TID WC   levothyroxine  88 mcg Oral q morning   lidocaine  1 patch Transdermal Q24H   potassium chloride  40 mEq Oral Daily   predniSONE  5 mg Oral Q breakfast   sodium chloride flush  10-40 mL Intracatheter Q12H   sodium chloride flush  3 mL Intravenous Q12H    Infusions:  sodium chloride     amiodarone 60 mg/hr (11/25/20 0700)   furosemide (LASIX) 200 mg in dextrose 5% 100 mL (2mg /mL) infusion 30 mg/hr (11/25/20 0700)   heparin 1,500 Units/hr (11/25/20 0815)   milrinone 0.375 mcg/kg/min (11/25/20 0700)   norepinephrine (LEVOPHED) Adult infusion 2 mcg/min (11/25/20 0700)    PRN Medications: sodium chloride, albuterol, cyclobenzaprine, HYDROcodone-acetaminophen, ondansetron (ZOFRAN) IV, oxyCODONE, sodium chloride flush, sodium chloride flush    Patient Profile   Ms Caley is a 65 year old with a history of chronic systolic hf previously EF 45%,  NICM, hypothyroid, PAF, gout, OSA, CKD Stage IV, HTN, hyperlipidemia, and lupus.   Admitted with A fib RVR and A/C systolic heart failure complicated by cardiogenic shock.     Assessment/Plan  Acute/Chronic Systolic Heart Failure Cardiogenic shock -8/1 Lactic acid 1.5. CO-OX 43%.--> milrinone increased 0.375 mcg + norepe 4 mcg. CO-OX repeated with improvement.  - Echo completed and showed EF has fallen  from  30-35% in 2021 to 25-30%.  - Back on dual inotropes, Milrinone 0.375 + NE 2. Co-ox 68%  - Remains fluid overloaded, CVP 18  - HF c/b persistent Afib, failed DCCV x 2  - Continue Lasix gtt at 30/hr  - If poor response to diuretics/ refractory volume overload ? CVVHD to help w/ fluid removal  - continue NE and milrinone at current dosages   - c/w amio gtt for rate control  - Supp K.   -No room for GDMT for now.   2. PAF--> Afib RVR - Developed shock and had urgent cardioversion 8/1 with conversion to NSR w/ ERAF - Repeat DCCV 8/4 w/ ERAF - c/w amio gtt at 60 hr and rebolus 150 x 1  - unfortunately not a candidate for ablation due to size  - TSH elevated. Synthroid increased.   - Continue heparin gtt    3. AKI on CKD Stage IV -Recent Creatinine baseline 3.2 -> peaked at 4.2.  -Renal US with no acute findings. - SCr now plateaued ~4.1  -Nonoliguric  -Continue inotropic support -Nephrology following  - ? Short term HD to help w/ fluid removal    4. OSA Uses CPAP   5. Hypothyroidism -TSH 8. T3 ok and T4 2.3  -On synthroid. Dose increased to 88 mcg.  -Repeat TSH in 6 weeks.    6. Gout - Uric  Acid 10. Pain RLE ? Flare  - On uloric 40 mg daily  - Completed 3 day course of prednisone    7. Hypokalemia  - 3.6 today Supp K.   8. Deconditioning - Continue PT/OT.  - needs HHPT. ? SNF  9. Hypervolemic Hyponatremia - Na 129>>132   - diuresis per above    Length of Stay: [address], PA-C  11/25/2020, 8:36 AM  Advanced Heart Failure Team Pager [phone] (M-F; 7a - 5p)  Please contact [facility] for night-coverage after hours (5p -7a ) and weekends on [email or web address]  Agree with above.   Remains on milrinone and NE. Back in AF with RVR after DC-CV yesterday. Co-ox 68% CVP 16   Breathing ok. SCr stable at 4.1  General:  Sitting in chair No resp difficulty HEENT: normal Neck: supple. JVP to ear  Carotids 2+ bilat; no bruits. No lymphadenopathy or thryomegaly appreciated. Cor: PMI nondisplaced. Irreg tachy No rubs, gallops or murmurs. Lungs: clear Abdomen: obese soft, nontender, nondistended. No hepatosplenomegaly. No bruits or masses. Good bowel sounds. Extremities: no cyanosis, clubbing, rash, 2+ edema Neuro: alert & orientedx3, cranial nerves grossly intact. moves all 4 extremities w/o  difficulty. Affect pleasant  Remains in very difficult spot.She has severe biventricular dysfunction on TEE. Does not tolerate AF well. SCR hovering around 4. Remains volume overloaded.  Will continue on NE and milrinone. I think maintaining NSR is our only hope for her to get some cardiac recovery. Will plan for repeat DC-CV again over the weekend. Not good candidate for AV node ablation and CRT with likely need for HD soon.   No acute need for HD but I worry she is headed that way. Though she may not be able to tolerate.   CRITICAL CARE Performed by: Glori Bickers  Total critical care time: 35 minutes  Critical care time was exclusive of separately billable procedures and treating other patients.  Critical care was necessary to treat or prevent imminent or life-threatening deterioration.  Critical care was time spent personally by me (independent of midlevel providers or residents) on the  following activities: development of treatment plan with patient and/or surrogate as well as nursing, discussions with consultants, evaluation of patient's response to treatment, examination of patient, obtaining history from patient or surrogate, ordering and performing treatments and interventions, ordering and review of laboratory studies, ordering and review of radiographic studies, pulse oximetry and re-evaluation of patient's condition.   Glori Bickers, MD  10:43 AM

## 2020-11-26 DIAGNOSIS — R570 Cardiogenic shock: Secondary | ICD-10-CM | POA: Diagnosis not present

## 2020-11-26 DIAGNOSIS — I4891 Unspecified atrial fibrillation: Secondary | ICD-10-CM | POA: Diagnosis not present

## 2020-11-26 DIAGNOSIS — N179 Acute kidney failure, unspecified: Secondary | ICD-10-CM | POA: Diagnosis not present

## 2020-11-26 LAB — CBC
HCT: 37.2 % (ref 36.0–46.0)
Hemoglobin: 12.4 g/dL (ref 12.0–15.0)
MCH: 32.3 pg (ref 26.0–34.0)
MCHC: 33.3 g/dL (ref 30.0–36.0)
MCV: 96.9 fL (ref 80.0–100.0)
Platelets: 220 10*3/uL (ref 150–400)
RBC: 3.84 MIL/uL — ABNORMAL LOW (ref 3.87–5.11)
RDW: 14.1 % (ref 11.5–15.5)
WBC: 9.8 10*3/uL (ref 4.0–10.5)
nRBC: 0.2 % (ref 0.0–0.2)

## 2020-11-26 LAB — COMPREHENSIVE METABOLIC PANEL
ALT: 140 U/L — ABNORMAL HIGH (ref 0–44)
AST: 18 U/L (ref 15–41)
Albumin: 3.1 g/dL — ABNORMAL LOW (ref 3.5–5.0)
Alkaline Phosphatase: 60 U/L (ref 38–126)
Anion gap: 14 (ref 5–15)
BUN: 97 mg/dL — ABNORMAL HIGH (ref 8–23)
CO2: 29 mmol/L (ref 22–32)
Calcium: 9.5 mg/dL (ref 8.9–10.3)
Chloride: 88 mmol/L — ABNORMAL LOW (ref 98–111)
Creatinine, Ser: 3.81 mg/dL — ABNORMAL HIGH (ref 0.44–1.00)
GFR, Estimated: 13 mL/min — ABNORMAL LOW (ref >60)
Glucose, Bld: 122 mg/dL — ABNORMAL HIGH (ref 70–99)
Potassium: 3.1 mmol/L — ABNORMAL LOW (ref 3.5–5.1)
Sodium: 131 mmol/L — ABNORMAL LOW (ref 135–145)
Total Bilirubin: 1.1 mg/dL (ref 0.3–1.2)
Total Protein: 6.3 g/dL — ABNORMAL LOW (ref 6.5–8.1)

## 2020-11-26 LAB — COOXEMETRY PANEL
Carboxyhemoglobin: 1.4 % (ref 0.5–1.5)
Methemoglobin: 0.9 % (ref 0.0–1.5)
O2 Saturation: 57.9 %
Total hemoglobin: 12.4 g/dL (ref 12.0–16.0)

## 2020-11-26 LAB — HEPARIN LEVEL (UNFRACTIONATED): Heparin Unfractionated: 0.91 IU/mL — ABNORMAL HIGH (ref 0.30–0.70)

## 2020-11-26 LAB — APTT: aPTT: 84 seconds — ABNORMAL HIGH (ref 24–36)

## 2020-11-26 LAB — GLUCOSE, CAPILLARY
Glucose-Capillary: 127 mg/dL — ABNORMAL HIGH (ref 70–99)
Glucose-Capillary: 154 mg/dL — ABNORMAL HIGH (ref 70–99)
Glucose-Capillary: 137 mg/dL — ABNORMAL HIGH (ref 70–99)
Glucose-Capillary: 117 mg/dL — ABNORMAL HIGH (ref 70–99)

## 2020-11-26 MED ORDER — POTASSIUM CHLORIDE 10 MEQ/50ML IV SOLN
10.0000 meq | Freq: Once | INTRAVENOUS | Status: AC
  Administered 2020-11-26: 10 meq via INTRAVENOUS
  Filled 2020-11-26: qty 50

## 2020-11-26 MED ORDER — METOLAZONE 5 MG PO TABS
5.0000 mg | ORAL_TABLET | Freq: Once | ORAL | Status: AC
  Administered 2020-11-26: 5 mg via ORAL
  Filled 2020-11-26: qty 1

## 2020-11-26 MED ORDER — POTASSIUM CHLORIDE CRYS ER 20 MEQ PO TBCR
40.0000 meq | EXTENDED_RELEASE_TABLET | Freq: Once | ORAL | Status: AC
  Administered 2020-11-26: 40 meq via ORAL
  Filled 2020-11-26: qty 2

## 2020-11-26 NOTE — Progress Notes (Signed)
ANTICOAGULATION CONSULT NOTE - Follow-Up Consult  Pharmacy Consult for Heparin Indication: atrial fibrillation  Patient Measurements: Height: 5' 3.5" (161.3 cm) Weight: 126.1 kg (278 lb) IBW/kg (Calculated) : 53.55 Heparin Dosing Weight: 85 kg  Vital Signs: Temp: 97.8 F (36.6 C) (08/06 1100) Temp Source: Axillary (08/06 0621) Pulse Rate: 80 (08/06 1200)  Labs: Recent Labs    11/24/20 0502 11/24/20 1411 11/25/20 0011 11/25/20 0419 11/26/20 0413  HGB 12.0  --   --  12.0 12.4  HCT 36.4  --   --  35.4* 37.2  PLT 250  --   --  237 220  APTT 45* 65*  --  66* 84*  HEPARINUNFRC 0.77*  --   --  0.78* 0.91*  CREATININE 4.16* 4.00* 4.13* 4.10* 3.81*     Estimated Creatinine Clearance: 19.5 mL/min (A) (by C-G formula based on SCr of 3.81 mg/dL (H)).   Medical History: Past Medical History:  Diagnosis Date   A-fib ([place])    Cardiomyopathy    Hyperlipidemia    Hypertension    Lupus ([place])    Pulmonary hypertension ([place]) 05/10/2011   Echo, EF-40-45   Renal disorder    Sleep apnea 2008   CPAP, pt does not know settings   Thyroid disease     Medications:   calcitRIOL  0.25 mcg Oral Daily   Chlorhexidine Gluconate Cloth  6 each Topical Daily   clotrimazole  1 application Topical BID   estradiol  1 mg Oral Daily   febuxostat  40 mg Oral q AM   ferrous sulfate  325 mg Oral q morning   fluticasone  2 spray Each Nare Daily   fluticasone furoate-vilanterol  1 puff Inhalation Daily   gabapentin  100 mg Oral TID   insulin aspart  0-6 Units Subcutaneous TID WC   levothyroxine  88 mcg Oral q morning   lidocaine  1 patch Transdermal Q24H   potassium chloride  40 mEq Oral Daily   potassium chloride  40 mEq Oral Once   predniSONE  5 mg Oral Q breakfast   sodium chloride flush  10-40 mL Intracatheter Q12H   sodium chloride flush  3 mL Intravenous Q12H     Assessment: 65 y.o. female with  h/o Afib, DOAC on hold, for heparin.  Pt was on Xarelto PTA and changed to Eliquis due  to ARF.  Eliquis 5 mg last given at 2317 7/31.   aPTT therapeutic at 84 seconds on heparin drip 1500 uts/hr  CBC stable, heparin level still altered by DOAC use in setting of renal failure. Pt s/p DCCV to NSR 8/4 back in SR 8/5 >  Amio iv 60mg /hr  Goal of Therapy:  Heparin level 0.3-0.7 units/ml aPTT 66-102 seconds Monitor platelets by anticoagulation protocol: Yes   Plan:  Continue heparin to 1500 units/h -Recheck CBC  aPTT and heparin level with daily labs   [facility] Pharm.D. CPP, BCPS Clinical Pharmacist [phone] 11/26/2020 1:16 PM   Please check AMION for all [place] numbers 11/26/2020

## 2020-11-26 NOTE — Progress Notes (Signed)
Pt not ready to wear CPAP at this time.  RN stated he would assist patient with CPAP. RT will monitor as needed.

## 2020-11-26 NOTE — Progress Notes (Addendum)
Advanced Heart Failure Rounding Note  PCP-Cardiologist: Shelva Majestic, MD   Subjective:   - 8/1 A fib RVR & A/C systolic heart faiure --> cardiogenic shock. Had urgent DC-CV with brief conversion to NSR but went back in A fib. . On Norepi + milrinone. AKI. Nephrology consulted.  - 8/2 A fib RVR given amio bolus x2 and continued on amio drip 60 mg per hour. Continue to diurese with lasix drip. Negative 1 liter. Continued on milrinone 0.375 mcg + norepi 2 mcg. Started on prednisone 40 mg daily x3 days for gout flare.  - 8/3 did not tolerate milrinone wean w/ drop in Co-ox 68%--->48% and decrease in UOP. Milrinone increased back to 0.375.  Lasix gtt increased to 30/hr.  - 8/4 NE added back given persistently low Co-ox - 8/4 s/p TEE/DCCV>>NSR  - 8/5 back in Afib w/ RVR   Remains in AF. Rates 110-140.   Remains on milrinone 0.375 + NE 2. Co-ox 58% SCr 4.1 -> 3.8   2.7L out. But I/Os even on lasix gtt at 30. Weight up 3 pounds.   Objective:   Weight Range: 126.1 kg Body mass index is 48.47 kg/m.   Vital Signs:   Temp:  [97.2 F (36.2 C)-98.3 F (36.8 C)] 98.2 F (36.8 C) (08/06 0621) Pulse Rate:  [29-137] 134 (08/06 0905) Resp:  [15-40] 29 (08/06 0905) SpO2:  [90 %-100 %] 99 % (08/06 0905) Arterial Line BP: (81-138)/(64-89) 108/65 (08/06 0905) Weight:  [126.1 kg] 126.1 kg (08/06 0500) Last BM Date:  (PTA)  Weight change: Filed Weights   11/24/20 0500 11/25/20 0500 11/26/20 0500  Weight: 127.1 kg 125 kg 126.1 kg    Intake/Output:   Intake/Output Summary (Last 24 hours) at 11/26/2020 0943 Last data filed at 11/26/2020 0900 Gross per 24 hour  Intake 2850.61 ml  Output 2635 ml  Net 215.61 ml      Physical Exam   General:  Sitting up  No resp difficulty HEENT: normal Neck: supple. JVP to jaw  Carotids 2+ bilat; no bruits. No lymphadenopathy or thryomegaly appreciated. Cor: PMI nondisplaced. Irregular tachy No rubs, gallops or murmurs. Lungs: clear Abdomen: obese soft,  nontender, nondistended. No hepatosplenomegaly. No bruits or masses. Good bowel sounds. Extremities: no cyanosis, clubbing, rash, 2+ edema + UNNA Neuro: alert & orientedx3, cranial nerves grossly intact. moves all 4 extremities w/o difficulty. Affect pleasant   Telemetry   Afib, 110-140, personally reviewed.    Labs    CBC Recent Labs    11/25/20 0419 11/26/20 0413  WBC 9.4 9.8  HGB 12.0 12.4  HCT 35.4* 37.2  MCV 95.9 96.9  PLT 237 371    Basic Metabolic Panel Recent Labs    11/23/20 1310 11/24/20 0502 11/24/20 1411 11/25/20 0011 11/25/20 0419 11/26/20 0413  NA 131*   < > 131* 133* 132* 131*  K 4.4   < > 3.9 3.6 3.6 3.1*  CL 88*   < > 89* 88* 88* 88*  CO2 30   < > 29 31 30 29   GLUCOSE 158*   < > 169* 135* 135* 122*  BUN 88*   < > 93* 96* 97* 97*  CREATININE 3.81*   < > 4.00* 4.13* 4.10* 3.81*  CALCIUM 9.4   < > 9.4 9.8 9.6 9.5  MG  --   --   --  2.0  --   --   PHOS 2.8  --  3.3  --   --   --    < > =  values in this interval not displayed.    Liver Function Tests Recent Labs    11/25/20 0419 11/26/20 0413  AST 25 18  ALT 179* 140*  ALKPHOS 63 60  BILITOT 1.0 1.1  PROT 5.7* 6.3*  ALBUMIN 2.9* 3.1*    No results for input(s): LIPASE, AMYLASE in the last 72 hours. Cardiac Enzymes No results for input(s): CKTOTAL, CKMB, CKMBINDEX, TROPONINI in the last 72 hours.  BNP: BNP (last 3 results) Recent Labs    11/20/20 0318 11/21/20 0258  BNP 2,051.3* 2,022.4*     ProBNP (last 3 results) No results for input(s): PROBNP in the last 8760 hours.   D-Dimer No results for input(s): DDIMER in the last 72 hours. Hemoglobin A1C Recent Labs    11/24/20 0502  HGBA1C 5.9*    Fasting Lipid Panel No results for input(s): CHOL, HDL, LDLCALC, TRIG, CHOLHDL, LDLDIRECT in the last 72 hours. Thyroid Function Tests No results for input(s): TSH, T4TOTAL, T3FREE, THYROIDAB in the last 72 hours.  Invalid input(s): FREET3   Other results:   Imaging     No results found.   Medications:     Scheduled Medications:  calcitRIOL  0.25 mcg Oral Daily   Chlorhexidine Gluconate Cloth  6 each Topical Daily   clotrimazole  1 application Topical BID   estradiol  1 mg Oral Daily   febuxostat  40 mg Oral q AM   ferrous sulfate  325 mg Oral q morning   fluticasone  2 spray Each Nare Daily   fluticasone furoate-vilanterol  1 puff Inhalation Daily   gabapentin  100 mg Oral TID   insulin aspart  0-6 Units Subcutaneous TID WC   levothyroxine  88 mcg Oral q morning   lidocaine  1 patch Transdermal Q24H   potassium chloride  40 mEq Oral Daily   predniSONE  5 mg Oral Q breakfast   sodium chloride flush  10-40 mL Intracatheter Q12H   sodium chloride flush  3 mL Intravenous Q12H    Infusions:  sodium chloride     amiodarone 60 mg/hr (11/26/20 0900)   furosemide (LASIX) 200 mg in dextrose 5% 100 mL (2mg /mL) infusion 30 mg/hr (11/26/20 0900)   heparin 1,500 Units/hr (11/26/20 0900)   milrinone 0.375 mcg/kg/min (11/26/20 0900)   norepinephrine (LEVOPHED) Adult infusion 2 mcg/min (11/26/20 0900)    PRN Medications: sodium chloride, albuterol, cyclobenzaprine, HYDROcodone-acetaminophen, ondansetron (ZOFRAN) IV, oxyCODONE, sodium chloride flush, sodium chloride flush    Patient Profile   Caitlyn Fuentes is a 65 year old with a history of chronic systolic hf previously EF 45%,  NICM, hypothyroid, PAF, gout, OSA, CKD Stage IV, HTN, hyperlipidemia, and lupus.   Admitted with A fib RVR and A/C systolic heart failure complicated by cardiogenic shock.     Assessment/Plan    1. Acute/Chronic Systolic Heart Failure Cardiogenic shock -8/1 Lactic acid 1.5. CO-OX 43%.--> milrinone increased 0.375 mcg + norepe 4 mcg. CO-OX repeated with improvement.  - Echo completed and showed EF has fallen from  30-35% in 2021 to 25-30%.  - Back on dual inotropes, Milrinone 0.375 + NE 2. Co-ox 68% -> 58% - Remains fluid overloaded despite lasix gtt at 30. Diuresis  limited by recurrent AF and advanced CKD IV - Has failed DCCV x 2  - If poor response to diuretics/ refractory volume overload ? CVVHD to help w/ fluid removal  - continue NE and milrinone at current dosages   -No room for GDMT for now.   2. PAF-->  Afib RVR - Developed shock and had urgent cardioversion 8/1 with conversion to NSR w/ ERAF - Repeat DCCV 8/4 w/ ERAF - c/w amio gtt at 60 hr and rebolus 150 x 1  - unfortunately not a candidate for ablation due to size. Not candidate for AVN ablation and CRT with likely need for HD soon  - Continue heparin gtt  - Plan repeat DC-CV tomorrow after further amio load   3. AKI on CKD Stage IV -Recent Creatinine baseline 3.2 -> peaked at 4.2.  -Renal US with no acute findings. - SCr  4.1 -> 3.8 - BUN 97. No overt uremia - Difficult situation. Suspect she may need CVVHD soon to remove fluid. Seems to be poor candidate for iHD with advanced HF unless EF recovers some with restoration of NSR.   4. OSA Uses CPAP   5. Hypothyroidism -TSH 8. T3 ok and T4 2.3  -On synthroid. Dose increased to 88 mcg.  -Repeat TSH in 6 weeks.    6. Gout - Uric  Acid 10. Pain RLE ? Flare  - On uloric 40 mg daily  - Completed 3 day course of prednisone    7. Hypokalemia  - 3.1 today Supp K.   8. Deconditioning - Continue PT/OT.  - needs HHPT. ? SNF  9. Hypervolemic Hyponatremia - Na 129>>132 > 131 - limit free H2O  CRITICAL CARE Performed by: Caitlyn Fuentes  Total critical care time: 40 minutes  Critical care time was exclusive of separately billable procedures and treating other patients.  Critical care was necessary to treat or prevent imminent or life-threatening deterioration.  Critical care was time spent personally by me (independent of midlevel providers or residents) on the following activities: development of treatment plan with patient and/or surrogate as well as nursing, discussions with consultants, evaluation of patient's response to  treatment, examination of patient, obtaining history from patient or surrogate, ordering and performing treatments and interventions, ordering and review of laboratory studies, ordering and review of radiographic studies, pulse oximetry and re-evaluation of patient's condition.   Length of Stay: 6  Caitlyn Bickers, MD  11/26/2020, 9:43 AM  Advanced Heart Failure Team Pager [phone] (M-F; 7a - 5p)  Please contact [facility] for night-coverage after hours (5p -7a ) and weekends on [email or web address]

## 2020-11-27 DIAGNOSIS — I4891 Unspecified atrial fibrillation: Secondary | ICD-10-CM | POA: Diagnosis not present

## 2020-11-27 DIAGNOSIS — N179 Acute kidney failure, unspecified: Secondary | ICD-10-CM | POA: Diagnosis not present

## 2020-11-27 DIAGNOSIS — R570 Cardiogenic shock: Secondary | ICD-10-CM | POA: Diagnosis not present

## 2020-11-27 LAB — COOXEMETRY PANEL
Carboxyhemoglobin: 1.7 % — ABNORMAL HIGH (ref 0.5–1.5)
Methemoglobin: 0.9 % (ref 0.0–1.5)
O2 Saturation: 63.2 %
Total hemoglobin: 12.6 g/dL (ref 12.0–16.0)

## 2020-11-27 LAB — BASIC METABOLIC PANEL
Anion gap: 15 (ref 5–15)
BUN: 96 mg/dL — ABNORMAL HIGH (ref 8–23)
CO2: 29 mmol/L (ref 22–32)
Calcium: 9.6 mg/dL (ref 8.9–10.3)
Chloride: 88 mmol/L — ABNORMAL LOW (ref 98–111)
Creatinine, Ser: 3.92 mg/dL — ABNORMAL HIGH (ref 0.44–1.00)
GFR, Estimated: 12 mL/min — ABNORMAL LOW (ref >60)
Glucose, Bld: 123 mg/dL — ABNORMAL HIGH (ref 70–99)
Potassium: 3.1 mmol/L — ABNORMAL LOW (ref 3.5–5.1)
Sodium: 132 mmol/L — ABNORMAL LOW (ref 135–145)

## 2020-11-27 LAB — CBC
HCT: 37.8 % (ref 36.0–46.0)
Hemoglobin: 12.6 g/dL (ref 12.0–15.0)
MCH: 32.3 pg (ref 26.0–34.0)
MCHC: 33.3 g/dL (ref 30.0–36.0)
MCV: 96.9 fL (ref 80.0–100.0)
Platelets: 221 10*3/uL (ref 150–400)
RBC: 3.9 MIL/uL (ref 3.87–5.11)
RDW: 14 % (ref 11.5–15.5)
WBC: 9.6 10*3/uL (ref 4.0–10.5)
nRBC: 0 % (ref 0.0–0.2)

## 2020-11-27 LAB — GLUCOSE, CAPILLARY
Glucose-Capillary: 117 mg/dL — ABNORMAL HIGH (ref 70–99)
Glucose-Capillary: 143 mg/dL — ABNORMAL HIGH (ref 70–99)
Glucose-Capillary: 144 mg/dL — ABNORMAL HIGH (ref 70–99)
Glucose-Capillary: 159 mg/dL — ABNORMAL HIGH (ref 70–99)

## 2020-11-27 LAB — HEPARIN LEVEL (UNFRACTIONATED): Heparin Unfractionated: 0.68 IU/mL (ref 0.30–0.70)

## 2020-11-27 LAB — APTT: aPTT: 72 seconds — ABNORMAL HIGH (ref 24–36)

## 2020-11-27 MED ORDER — METOLAZONE 5 MG PO TABS
10.0000 mg | ORAL_TABLET | Freq: Two times a day (BID) | ORAL | Status: DC
  Administered 2020-11-27 – 2020-11-28 (×3): 10 mg via ORAL
  Filled 2020-11-27 (×3): qty 2

## 2020-11-27 MED ORDER — POTASSIUM CHLORIDE CRYS ER 20 MEQ PO TBCR
40.0000 meq | EXTENDED_RELEASE_TABLET | Freq: Once | ORAL | Status: AC
  Administered 2020-11-27: 40 meq via ORAL
  Filled 2020-11-27: qty 2

## 2020-11-27 NOTE — Progress Notes (Signed)
Patient ID: Caitlyn Fuentes, female   DOB: 15-Mar-1956, 65 y.o.   MRN: [id number] [facility] Progress Note   Interval history: 65 year old African-American woman with past medical history significant for chronic congestive heart failure with reduced ejection fraction (EF 45%), nonischemic cardiomyopathy, hypothyroidism, hypertension, dyslipidemia, obstructive sleep apnea, obesity, systemic lupus erythematosus and chronic kidney disease stage IV at baseline (baseline creatinine apparently around 2.0) who presented to the hospital with worsening shortness of breath, lower extremity edema, palpitations and dyspnea on exertion.  She was found to have volume overload with pulmonary edema along with atrial patient with RVR and was admitted to the ICU for cardiogenic shock.  Nephrology was originally consulted for management of her oliguric acute kidney injury secondary to cardiogenic shock/cardiorenal syndrome and appears to have been sluggishly improving at the time of sign off (creatinine down to 3.5 from 4.2) with decent diuresis.  Unfortunately, she has remained on Lasix drip with suboptimal urine output and worsening renal function (BUN 96/creatinine 3.9) likely in part from recurrence of atrial fibrillation with RVR following DCCV.  She remains on milrinone and Levophed drip with elevated CVP of 22 cm of water. Assessment/ Plan:   1. Acute kidney Injury on chronic kidney disease stage IV: Nonoliguric and ongoing furosemide augmented earlier today with the addition of metolazone.  Concern raised with persistently elevated CVP and worsening azotemia/creatinine and if labs continue to worsen and response to diuretics is suboptimal, she will need initiation of CRRT for volume unloading/azotemia management. 2.  Acute exacerbation of congestive heart failure: On milrinone for inotropic support along with norepinephrine for cardiogenic shock component.  Seemingly with decent urine output but overall  minimally net negative and today had addition of metolazone to augment diuresis.  If unsuccessful overnight, will need initiation of CRRT for extracorporeal volume unloading. 3.  Hypokalemia: Secondary to urinary losses and limited intake, agree with supplementation via oral route. 4.  Atrial fibrillation with rapid ventricular response: RVR with decreased cardiac output likely contributing to renal hypoperfusion/dysfunction. 5.  Hyponatremia: Secondary to acute exacerbation of congestive heart failure/worsening renal function and impaired free water handling.  Continue to monitor with diuresis  Subjective:   Reports to be feeling fairly and denies any chest pain or shortness of breath.   Objective:   BP 108/80   Pulse (!) 131   Temp 98.5 F (36.9 C) (Oral)   Resp (!) 25   Ht 5' 3.5" (1.613 m)   Wt 126.1 kg   SpO2 100%   BMI 48.47 kg/m   Intake/Output Summary (Last 24 hours) at 11/27/2020 1155 Last data filed at 11/27/2020 1100 Gross per 24 hour  Intake 3383.69 ml  Output 3510 ml  Net -126.31 ml   Weight change:   Physical Exam: Gen: Sitting up in recliner eating her lunch, not visibly dyspneic CVS: Pulse irregularly irregular tachycardia, S1 and S2 normal.  Distended jugular veins to angle of jaw Resp: Clear to auscultation bilaterally without distinct rales or rhonchi Abd: Soft, obese, nontender, bowel sounds normal Ext: 3+ pitting edema with bilateral Unna boots  Imaging: No results found.  Labs: BMET Recent Labs  Lab 11/22/20 1400 11/23/20 0449 11/23/20 1310 11/24/20 0502 11/24/20 1411 11/25/20 0011 11/25/20 0419 11/26/20 0413 11/27/20 0313  NA 129*   < > 131* 129* 131* 133* 132* 131* 132*  K 3.5   < > 4.4 3.7 3.9 3.6 3.6 3.1* 3.1*  CL 89*   < > 88* 86* 89* 88* 88* 88* 88*  CO2  25   < > 30 26 29 31 30 29 29   GLUCOSE 201*   < > 158* 189* 169* 135* 135* 122* 123*  BUN 91*   < > 88* 93* 93* 96* 97* 97* 96*  CREATININE 3.82*   < > 3.81* 4.16* 4.00* 4.13* 4.10*  3.81* 3.92*  CALCIUM 9.4   < > 9.4 10.0 9.4 9.8 9.6 9.5 9.6  PHOS 2.9  --  2.8  --  3.3  --   --   --   --    < > = values in this interval not displayed.   CBC Recent Labs  Lab 11/24/20 0502 11/25/20 0419 11/26/20 0413 11/27/20 0313  WBC 7.5 9.4 9.8 9.6  HGB 12.0 12.0 12.4 12.6  HCT 36.4 35.4* 37.2 37.8  MCV 96.0 95.9 96.9 96.9  PLT 250 237 220 221    Medications:     calcitRIOL  0.25 mcg Oral Daily   Chlorhexidine Gluconate Cloth  6 each Topical Daily   clotrimazole  1 application Topical BID   estradiol  1 mg Oral Daily   febuxostat  40 mg Oral q AM   ferrous sulfate  325 mg Oral q morning   fluticasone  2 spray Each Nare Daily   fluticasone furoate-vilanterol  1 puff Inhalation Daily   gabapentin  100 mg Oral TID   insulin aspart  0-6 Units Subcutaneous TID WC   levothyroxine  88 mcg Oral q morning   lidocaine  1 patch Transdermal Q24H   metolazone  10 mg Oral BID   potassium chloride  40 mEq Oral Daily   potassium chloride  40 mEq Oral Once   predniSONE  5 mg Oral Q breakfast   sodium chloride flush  10-40 mL Intracatheter Q12H   sodium chloride flush  3 mL Intravenous Q12H      Elmarie Shiley, MD 11/27/2020, 11:55 AM

## 2020-11-27 NOTE — Progress Notes (Signed)
ANTICOAGULATION CONSULT NOTE - Follow-Up Consult  Pharmacy Consult for Heparin Indication: atrial fibrillation  Patient Measurements: Height: 5' 3.5" (161.3 cm) Weight: 126.1 kg (278 lb) IBW/kg (Calculated) : 53.55 Heparin Dosing Weight: 85 kg  Vital Signs: Temp: 97.5 F (36.4 C) (08/07 0348) Temp Source: Oral (08/07 0348) BP: 113/83 (08/07 1000) Pulse Rate: 63 (08/07 1000)  Labs: Recent Labs    11/25/20 0419 11/26/20 0413 11/27/20 0313  HGB 12.0 12.4 12.6  HCT 35.4* 37.2 37.8  PLT 237 220 221  APTT 66* 84* 72*  HEPARINUNFRC 0.78* 0.91* 0.68  CREATININE 4.10* 3.81* 3.92*     Estimated Creatinine Clearance: 18.9 mL/min (A) (by C-G formula based on SCr of 3.92 mg/dL (H)).   Medical History: Past Medical History:  Diagnosis Date   A-fib ([place])    Cardiomyopathy    Hyperlipidemia    Hypertension    Lupus ([place])    Pulmonary hypertension ([place]) 05/10/2011   Echo, EF-40-45   Renal disorder    Sleep apnea 2008   CPAP, pt does not know settings   Thyroid disease     Medications:   calcitRIOL  0.25 mcg Oral Daily   Chlorhexidine Gluconate Cloth  6 each Topical Daily   clotrimazole  1 application Topical BID   estradiol  1 mg Oral Daily   febuxostat  40 mg Oral q AM   ferrous sulfate  325 mg Oral q morning   fluticasone  2 spray Each Nare Daily   fluticasone furoate-vilanterol  1 puff Inhalation Daily   gabapentin  100 mg Oral TID   insulin aspart  0-6 Units Subcutaneous TID WC   levothyroxine  88 mcg Oral q morning   lidocaine  1 patch Transdermal Q24H   potassium chloride  40 mEq Oral Daily   predniSONE  5 mg Oral Q breakfast   sodium chloride flush  10-40 mL Intracatheter Q12H   sodium chloride flush  3 mL Intravenous Q12H     Assessment: 65 y.o. female with  h/o Afib, DOAC on hold, for heparin.  Pt was on Xarelto PTA and changed to Eliquis due to ARF.  Eliquis 5 mg last given at 2317 7/31.   aPTT therapeutic at 72 seconds on heparin drip 1500 uts/hr   CBC stable, heparin level still altered by DOAC use in setting of renal failure. Pt s/p DCCV to NSR 8/4 back in Afib 8/5 >  Amio iv 60mg /hr  Goal of Therapy:  Heparin level 0.3-0.7 units/ml aPTT 66-102 seconds Monitor platelets by anticoagulation protocol: Yes   Plan:  Continue heparin to 1500 units/h -Recheck CBC  aPTT and heparin level with daily labs   [facility] Pharm.D. CPP, BCPS Clinical Pharmacist [phone] 11/27/2020 10:18 AM   Please check AMION for all [place] numbers 11/27/2020

## 2020-11-27 NOTE — Progress Notes (Addendum)
Advanced Heart Failure Rounding Note  PCP-Cardiologist: Shelva Majestic, MD   Subjective:   - 8/1 A fib RVR & A/C systolic heart faiure --> cardiogenic shock. Had urgent DC-CV with brief conversion to NSR but went back in A fib. . On Norepi + milrinone. AKI. Nephrology consulted.  - 8/2 A fib RVR given amio bolus x2 and continued on amio drip 60 mg per hour. Continue to diurese with lasix drip. Negative 1 liter. Continued on milrinone 0.375 mcg + norepi 2 mcg. Started on prednisone 40 mg daily x3 days for gout flare.  - 8/3 did not tolerate milrinone wean w/ drop in Co-ox 68%--->48% and decrease in UOP. Milrinone increased back to 0.375.  Lasix gtt increased to 30/hr.  - 8/4 NE added back given persistently low Co-ox - 8/4 s/p TEE/DCCV>>NSR  - 8/5 back in Afib w/ RVR   Remains in AF. Rates 110-140.   Remains on milrinone 0.375 + NE 2. Co-ox 63% SCr 4.1 -> 3.8 -> 3.9 CVP 22  On lasix gtt. Received metolazone 5 yesterday as well. 3.0L out. But I/Os only -180 Weight not recorded.   Objective:   Weight Range: 126.1 kg Body mass index is 48.47 kg/m.   Vital Signs:   Temp:  [97.5 F (36.4 C)-98.1 F (36.7 C)] 97.5 F (36.4 C) (08/07 0348) Pulse Rate:  [36-140] 63 (08/07 1000) Resp:  [15-30] 19 (08/07 1000) BP: (103-155)/(71-104) 113/83 (08/07 1000) SpO2:  [91 %-100 %] 99 % (08/07 1000) Arterial Line BP: (74-126)/(57-106) 117/72 (08/06 2200) Last BM Date:  (PTA)  Weight change: Filed Weights   11/24/20 0500 11/25/20 0500 11/26/20 0500  Weight: 127.1 kg 125 kg 126.1 kg    Intake/Output:   Intake/Output Summary (Last 24 hours) at 11/27/2020 1013 Last data filed at 11/27/2020 1000 Gross per 24 hour  Intake 3298.49 ml  Output 3490 ml  Net -191.51 ml      Physical Exam   General:  Sitting up No resp difficulty HEENT: normal Neck: supple. JVP to ear  Carotids 2+ bilat; no bruits. No lymphadenopathy or thryomegaly appreciated. Cor: PMI nondisplaced. Irregular tachy No rubs,  gallops or murmurs. Lungs: clear Abdomen: obese soft, nontender, nondistended. No hepatosplenomegaly. No bruits or masses. Good bowel sounds. Extremities: no cyanosis, clubbing, rash, 3+ edema + UNNA Neuro: alert & orientedx3, cranial nerves grossly intact. moves all 4 extremities w/o difficulty. Affect pleasant   Telemetry   Afib, 110-140, personally reviewed.    Labs    CBC Recent Labs    11/26/20 0413 11/27/20 0313  WBC 9.8 9.6  HGB 12.4 12.6  HCT 37.2 37.8  MCV 96.9 96.9  PLT 220 993    Basic Metabolic Panel Recent Labs    11/24/20 1411 11/25/20 0011 11/25/20 0419 11/26/20 0413 11/27/20 0313  NA 131* 133*   < > 131* 132*  K 3.9 3.6   < > 3.1* 3.1*  CL 89* 88*   < > 88* 88*  CO2 29 31   < > 29 29  GLUCOSE 169* 135*   < > 122* 123*  BUN 93* 96*   < > 97* 96*  CREATININE 4.00* 4.13*   < > 3.81* 3.92*  CALCIUM 9.4 9.8   < > 9.5 9.6  MG  --  2.0  --   --   --   PHOS 3.3  --   --   --   --    < > = values in this interval not  displayed.    Liver Function Tests Recent Labs    11/25/20 0419 11/26/20 0413  AST 25 18  ALT 179* 140*  ALKPHOS 63 60  BILITOT 1.0 1.1  PROT 5.7* 6.3*  ALBUMIN 2.9* 3.1*    No results for input(s): LIPASE, AMYLASE in the last 72 hours. Cardiac Enzymes No results for input(s): CKTOTAL, CKMB, CKMBINDEX, TROPONINI in the last 72 hours.  BNP: BNP (last 3 results) Recent Labs    11/20/20 0318 11/21/20 0258  BNP 2,051.3* 2,022.4*     ProBNP (last 3 results) No results for input(s): PROBNP in the last 8760 hours.   D-Dimer No results for input(s): DDIMER in the last 72 hours. Hemoglobin A1C No results for input(s): HGBA1C in the last 72 hours.  Fasting Lipid Panel No results for input(s): CHOL, HDL, LDLCALC, TRIG, CHOLHDL, LDLDIRECT in the last 72 hours. Thyroid Function Tests No results for input(s): TSH, T4TOTAL, T3FREE, THYROIDAB in the last 72 hours.  Invalid input(s): FREET3   Other results:   Imaging     No results found.   Medications:     Scheduled Medications:  calcitRIOL  0.25 mcg Oral Daily   Chlorhexidine Gluconate Cloth  6 each Topical Daily   clotrimazole  1 application Topical BID   estradiol  1 mg Oral Daily   febuxostat  40 mg Oral q AM   ferrous sulfate  325 mg Oral q morning   fluticasone  2 spray Each Nare Daily   fluticasone furoate-vilanterol  1 puff Inhalation Daily   gabapentin  100 mg Oral TID   insulin aspart  0-6 Units Subcutaneous TID WC   levothyroxine  88 mcg Oral q morning   lidocaine  1 patch Transdermal Q24H   potassium chloride  40 mEq Oral Daily   predniSONE  5 mg Oral Q breakfast   sodium chloride flush  10-40 mL Intracatheter Q12H   sodium chloride flush  3 mL Intravenous Q12H    Infusions:  sodium chloride     amiodarone 60 mg/hr (11/27/20 1000)   furosemide (LASIX) 200 mg in dextrose 5% 100 mL (2mg /mL) infusion 30 mg/hr (11/27/20 1000)   heparin 1,500 Units/hr (11/27/20 1000)   milrinone 0.375 mcg/kg/min (11/27/20 1000)   norepinephrine (LEVOPHED) Adult infusion 2 mcg/min (11/27/20 1000)    PRN Medications: sodium chloride, albuterol, cyclobenzaprine, HYDROcodone-acetaminophen, ondansetron (ZOFRAN) IV, oxyCODONE, sodium chloride flush, sodium chloride flush    Patient Profile   Caitlyn Fuentes is a 65 year old with a history of chronic systolic hf previously EF 45%,  NICM, hypothyroid, PAF, gout, OSA, CKD Stage IV, HTN, hyperlipidemia, and lupus.   Admitted with A fib RVR and A/C systolic heart failure complicated by cardiogenic shock.     Assessment/Plan    1. Acute/Chronic Systolic Heart Failure Cardiogenic shock -8/1 Lactic acid 1.5. CO-OX 43%.--> milrinone increased 0.375 mcg + norepe 4 mcg. CO-OX repeated with improvement.  - Echo 7/22 EF 30-35% in 2021 -> 25-30%.  - Back on dual inotropes, Milrinone 0.375 + NE 2. Co-ox 63% - Remains fluid overloaded despite lasix gtt at 30 and metoalzone Diuresis limited by recurrent AF  and advanced CKD IV - Has failed DCCV x 2  - She has had decent response to diuretics but CVP continues to rise. CVP now 22. I suspect we will need CVVHD - Will continue NE and milrinone at current dosages. Continue lasix gtt.  Add metolazone 10 bid - Plan repeat DC-CV tomorrow - If will d/w Renal again  to figure out timing of CVVHD  -No room for GDMT for now.   2. PAF--> Afib RVR - Developed shock and had urgent cardioversion 8/1 with conversion to NSR w/ ERAF - Repeat DCCV 8/4 w/ ERAF - c/w amio gtt at 60 hr and rebolus 150 x 1  - unfortunately not a candidate for ablation due to size. Not candidate for AVN ablation and CRT with likely need for HD soon  - Continue heparin gtt  - Plan repeat DC-CV in am. NPO now  3. AKI on CKD Stage IV -Recent Creatinine baseline 3.2 -> peaked at 4.2.  -Renal US with no acute findings. - SCr  4.1 -> 3.8 -> 3.9 - BUN 96. No overt uremia - Difficult situation. As above, suspect she may need CVVHD soon to remove fluid. Seems to be poor candidate for iHD with advanced HF unless EF recovers some with restoration of NSR.   4. OSA Uses CPAP   5. Hypothyroidism -TSH 8. T3 ok and T4 2.3  -On synthroid. Dose increased to 88 mcg.  -Repeat TSH in 6 weeks.    6. Gout - Uric  Acid 10. Pain RLE ? Flare  - On uloric 40 mg daily  - Completed 3 day course of prednisone    7. Hypokalemia  - 3.1 today Supp K.   8. Deconditioning - Continue PT/OT.  - needs HHPT. ? SNF  9. Hypervolemic Hyponatremia - Na 129>>132 > 131 > 132 - limit free H2O  10. Lupus - on low-dose prednisone.  CRITICAL CARE Performed by: Glori Bickers  Total critical care time: 45 minutes  Critical care time was exclusive of separately billable procedures and treating other patients.  Critical care was necessary to treat or prevent imminent or life-threatening deterioration.  Critical care was time spent personally by me (independent of midlevel providers or residents) on  the following activities: development of treatment plan with patient and/or surrogate as well as nursing, discussions with consultants, evaluation of patient's response to treatment, examination of patient, obtaining history from patient or surrogate, ordering and performing treatments and interventions, ordering and review of laboratory studies, ordering and review of radiographic studies, pulse oximetry and re-evaluation of patient's condition.   Length of Stay: [place], MD  11/27/2020, 10:13 AM  Advanced Heart Failure Team Pager [phone] (M-F; 7a - 5p)  Please contact [facility] for night-coverage after hours (5p -7a ) and weekends on [email or web address]

## 2020-11-28 ENCOUNTER — Inpatient Hospital Stay (HOSPITAL_COMMUNITY): Payer: Medicare Other

## 2020-11-28 DIAGNOSIS — N179 Acute kidney failure, unspecified: Secondary | ICD-10-CM | POA: Diagnosis not present

## 2020-11-28 DIAGNOSIS — R570 Cardiogenic shock: Secondary | ICD-10-CM | POA: Diagnosis not present

## 2020-11-28 DIAGNOSIS — I4891 Unspecified atrial fibrillation: Secondary | ICD-10-CM | POA: Diagnosis not present

## 2020-11-28 LAB — RENAL FUNCTION PANEL
Albumin: 3 g/dL — ABNORMAL LOW (ref 3.5–5.0)
Anion gap: 13 (ref 5–15)
BUN: 73 mg/dL — ABNORMAL HIGH (ref 8–23)
CO2: 28 mmol/L (ref 22–32)
Calcium: 9.1 mg/dL (ref 8.9–10.3)
Chloride: 89 mmol/L — ABNORMAL LOW (ref 98–111)
Creatinine, Ser: 3.22 mg/dL — ABNORMAL HIGH (ref 0.44–1.00)
GFR, Estimated: 15 mL/min — ABNORMAL LOW (ref >60)
Glucose, Bld: 167 mg/dL — ABNORMAL HIGH (ref 70–99)
Phosphorus: 3.6 mg/dL (ref 2.5–4.6)
Potassium: 3.8 mmol/L (ref 3.5–5.1)
Sodium: 130 mmol/L — ABNORMAL LOW (ref 135–145)

## 2020-11-28 LAB — CBC
HCT: 37 % (ref 36.0–46.0)
Hemoglobin: 12.5 g/dL (ref 12.0–15.0)
MCH: 32.7 pg (ref 26.0–34.0)
MCHC: 33.8 g/dL (ref 30.0–36.0)
MCV: 96.9 fL (ref 80.0–100.0)
Platelets: 225 10*3/uL (ref 150–400)
RBC: 3.82 MIL/uL — ABNORMAL LOW (ref 3.87–5.11)
RDW: 13.8 % (ref 11.5–15.5)
WBC: 8.7 10*3/uL (ref 4.0–10.5)
nRBC: 0 % (ref 0.0–0.2)

## 2020-11-28 LAB — COOXEMETRY PANEL
Carboxyhemoglobin: 1.3 % (ref 0.5–1.5)
Methemoglobin: 0.9 % (ref 0.0–1.5)
O2 Saturation: 56.2 %
Total hemoglobin: 12.5 g/dL (ref 12.0–16.0)

## 2020-11-28 LAB — GLUCOSE, CAPILLARY
Glucose-Capillary: 112 mg/dL — ABNORMAL HIGH (ref 70–99)
Glucose-Capillary: 133 mg/dL — ABNORMAL HIGH (ref 70–99)
Glucose-Capillary: 156 mg/dL — ABNORMAL HIGH (ref 70–99)
Glucose-Capillary: 140 mg/dL — ABNORMAL HIGH (ref 70–99)

## 2020-11-28 LAB — BASIC METABOLIC PANEL
Anion gap: 15 (ref 5–15)
BUN: 98 mg/dL — ABNORMAL HIGH (ref 8–23)
CO2: 28 mmol/L (ref 22–32)
Calcium: 9.5 mg/dL (ref 8.9–10.3)
Chloride: 86 mmol/L — ABNORMAL LOW (ref 98–111)
Creatinine, Ser: 4.1 mg/dL — ABNORMAL HIGH (ref 0.44–1.00)
GFR, Estimated: 12 mL/min — ABNORMAL LOW (ref >60)
Glucose, Bld: 133 mg/dL — ABNORMAL HIGH (ref 70–99)
Potassium: 3.7 mmol/L (ref 3.5–5.1)
Sodium: 129 mmol/L — ABNORMAL LOW (ref 135–145)

## 2020-11-28 LAB — MAGNESIUM: Magnesium: 1.8 mg/dL (ref 1.7–2.4)

## 2020-11-28 LAB — HEPARIN LEVEL (UNFRACTIONATED): Heparin Unfractionated: 0.59 IU/mL (ref 0.30–0.70)

## 2020-11-28 LAB — APTT: aPTT: 79 seconds — ABNORMAL HIGH (ref 24–36)

## 2020-11-28 MED ORDER — PROSOURCE PLUS PO LIQD
30.0000 mL | Freq: Two times a day (BID) | ORAL | Status: DC
  Administered 2020-11-28 – 2020-12-18 (×40): 30 mL via ORAL
  Filled 2020-11-28 (×36): qty 30

## 2020-11-28 MED ORDER — HEPARIN SODIUM (PORCINE) 1000 UNIT/ML DIALYSIS
1000.0000 [IU] | INTRAMUSCULAR | Status: DC | PRN
  Administered 2020-11-28 – 2020-12-01 (×3): 2400 [IU] via INTRAVENOUS_CENTRAL
  Administered 2020-12-03: 3000 [IU] via INTRAVENOUS_CENTRAL
  Filled 2020-11-28 (×3): qty 6
  Filled 2020-11-28: qty 5
  Filled 2020-11-28: qty 2
  Filled 2020-11-28: qty 6
  Filled 2020-11-28: qty 4
  Filled 2020-11-28: qty 3
  Filled 2020-11-28: qty 2
  Filled 2020-11-28: qty 6

## 2020-11-28 MED ORDER — PRISMASOL BGK 4/2.5 32-4-2.5 MEQ/L REPLACEMENT SOLN
Status: DC
Start: 2020-11-28 — End: 2020-12-03

## 2020-11-28 MED ORDER — FENTANYL CITRATE (PF) 100 MCG/2ML IJ SOLN
INTRAMUSCULAR | Status: AC
  Filled 2020-11-28: qty 2

## 2020-11-28 MED ORDER — MIDAZOLAM HCL 2 MG/2ML IJ SOLN
INTRAMUSCULAR | Status: AC
  Filled 2020-11-28: qty 2

## 2020-11-28 MED ORDER — B COMPLEX-C PO TABS
1.0000 | ORAL_TABLET | Freq: Every day | ORAL | Status: DC
  Administered 2020-11-28 – 2020-12-08 (×11): 1 via ORAL
  Filled 2020-11-28 (×11): qty 1

## 2020-11-28 MED ORDER — NOREPINEPHRINE 16 MG/250ML-% IV SOLN
0.0000 ug/min | INTRAVENOUS | Status: DC
  Administered 2020-11-28: 5 ug/min via INTRAVENOUS
  Administered 2020-11-29: 20 ug/min via INTRAVENOUS
  Administered 2020-11-30: 15 ug/min via INTRAVENOUS
  Administered 2020-12-05: 2 ug/min via INTRAVENOUS
  Filled 2020-11-28 (×4): qty 250

## 2020-11-28 MED ORDER — PRISMASOL BGK 4/2.5 32-4-2.5 MEQ/L REPLACEMENT SOLN: Status: DC

## 2020-11-28 MED ORDER — PRISMASOL BGK 4/2.5 32-4-2.5 MEQ/L EC SOLN: Status: DC

## 2020-11-28 NOTE — Progress Notes (Addendum)
Advanced Heart Failure Rounding Note  PCP-Cardiologist: Shelva Majestic, MD  HF clinic: Dr. Jeffie Pollock  Subjective:   - 8/1 A fib RVR & A/C systolic heart faiure --> cardiogenic shock. Had urgent DC-CV with brief conversion to NSR but went back in A fib. . On Norepi + milrinone. AKI. Nephrology consulted.  - 8/2 A fib RVR given amio bolus x2 and continued on amio drip 60 mg per hour. Continue to diurese with lasix drip. Negative 1 liter. Continued on milrinone 0.375 mcg + norepi 2 mcg. Started on prednisone 40 mg daily x3 days for gout flare.  - 8/3 did not tolerate milrinone wean w/ drop in Co-ox 68%--->48% and decrease in UOP. Milrinone increased back to 0.375.  Lasix gtt increased to 30/hr.  - 8/4 NE added back given persistently low Co-ox - 8/4 s/p TEE/DCCV>>NSR  - 8/5 back in Afib w/ RVR     Remains in AF. Rates 120s-130s.  Remains on milrinone 0.375 + NE 2. Co-ox 56%. SCr 4.1 -> 3.8 -> 3.9 -> 4.10. CVP 13  On lasix gtt. On 10 mg metolazone BID. Put out 3L but net even. Weight up 3 lb. Remains volume overloaded. No dyspnea at rest.  Objective:   Weight Range: 126.1 kg Body mass index is 48.47 kg/m.   Vital Signs:   Temp:  [97.6 F (36.4 C)-98.5 F (36.9 C)] 97.7 F (36.5 C) (08/08 0745) Pulse Rate:  [40-145] 60 (08/08 0600) Resp:  [15-29] 17 (08/08 0600) BP: (102-136)/(66-107) 136/97 (08/08 0600) SpO2:  [92 %-100 %] 95 % (08/08 0745) Last BM Date:  (PTA)  Weight change: Filed Weights   11/24/20 0500 11/25/20 0500 11/26/20 0500  Weight: 127.1 kg 125 kg 126.1 kg    Intake/Output:   Intake/Output Summary (Last 24 hours) at 11/28/2020 0809 Last data filed at 11/28/2020 0800 Gross per 24 hour  Intake 3227.1 ml  Output 2815 ml  Net 412.1 ml     Physical Exam  CVP 13 General:  Sitting up No resp difficulty HEENT: normal Neck: supple. +JVD.  Carotids 2+ bilat; no bruits. No lymphadenopathy or thryomegaly appreciated. Cor: PMI nondisplaced. Irregular, tachycardic.  No rubs, gallops or murmurs. Lungs: clear Abdomen: obese soft, nontender, nondistended. No hepatosplenomegaly. No bruits or masses. Good bowel sounds. Extremities: no cyanosis, clubbing, rash, 3+ edema Neuro: alert & orientedx3, cranial nerves grossly intact. moves all 4 extremities w/o difficulty. Affect pleasant   Telemetry   Afib, 120s-130s, personally reviewed.    Labs    CBC Recent Labs    11/27/20 0313 11/28/20 0230  WBC 9.6 8.7  HGB 12.6 12.5  HCT 37.8 37.0  MCV 96.9 96.9  PLT 221 768   Basic Metabolic Panel Recent Labs    11/27/20 0313 11/28/20 0230  NA 132* 129*  K 3.1* 3.7  CL 88* 86*  CO2 29 28  GLUCOSE 123* 133*  BUN 96* 98*  CREATININE 3.92* 4.10*  CALCIUM 9.6 9.5  MG  --  1.8   Liver Function Tests Recent Labs    11/26/20 0413  AST 18  ALT 140*  ALKPHOS 60  BILITOT 1.1  PROT 6.3*  ALBUMIN 3.1*   No results for input(s): LIPASE, AMYLASE in the last 72 hours. Cardiac Enzymes No results for input(s): CKTOTAL, CKMB, CKMBINDEX, TROPONINI in the last 72 hours.  BNP: BNP (last 3 results) Recent Labs    11/20/20 0318 11/21/20 0258  BNP 2,051.3* 2,022.4*    ProBNP (last 3 results) No results for  input(s): PROBNP in the last 8760 hours.   D-Dimer No results for input(s): DDIMER in the last 72 hours. Hemoglobin A1C No results for input(s): HGBA1C in the last 72 hours.  Fasting Lipid Panel No results for input(s): CHOL, HDL, LDLCALC, TRIG, CHOLHDL, LDLDIRECT in the last 72 hours. Thyroid Function Tests No results for input(s): TSH, T4TOTAL, T3FREE, THYROIDAB in the last 72 hours.  Invalid input(s): FREET3   Other results:   Imaging    No results found.   Medications:     Scheduled Medications:  calcitRIOL  0.25 mcg Oral Daily   Chlorhexidine Gluconate Cloth  6 each Topical Daily   clotrimazole  1 application Topical BID   estradiol  1 mg Oral Daily   febuxostat  40 mg Oral q AM   ferrous sulfate  325 mg Oral q  morning   fluticasone  2 spray Each Nare Daily   fluticasone furoate-vilanterol  1 puff Inhalation Daily   gabapentin  100 mg Oral TID   insulin aspart  0-6 Units Subcutaneous TID WC   levothyroxine  88 mcg Oral q morning   lidocaine  1 patch Transdermal Q24H   metolazone  10 mg Oral BID   potassium chloride  40 mEq Oral Daily   predniSONE  5 mg Oral Q breakfast   sodium chloride flush  10-40 mL Intracatheter Q12H   sodium chloride flush  3 mL Intravenous Q12H    Infusions:   prismasol BGK 4/2.5      prismasol BGK 4/2.5     sodium chloride     amiodarone 60 mg/hr (11/28/20 0800)   furosemide (LASIX) 200 mg in dextrose 5% 100 mL (2mg /mL) infusion 30 mg/hr (11/28/20 0800)   heparin 1,500 Units/hr (11/28/20 0800)   milrinone 0.375 mcg/kg/min (11/28/20 0800)   norepinephrine (LEVOPHED) Adult infusion 2 mcg/min (11/28/20 0800)   prismasol BGK 4/2.5      PRN Medications: sodium chloride, albuterol, cyclobenzaprine, heparin, HYDROcodone-acetaminophen, ondansetron (ZOFRAN) IV, oxyCODONE, sodium chloride flush, sodium chloride flush    Patient Profile   Caitlyn Fuentes is a 65 year old with a history of chronic systolic hf previously EF 45%,  NICM, hypothyroid, PAF, gout, OSA, CKD Stage IV, HTN, hyperlipidemia, and lupus.   Admitted with A fib RVR and A/C systolic heart failure complicated by cardiogenic shock.     Assessment/Plan    1. Acute/Chronic Systolic Heart Failure Cardiogenic shock -8/1 Lactic acid 1.5. CO-OX 43%.--> milrinone increased 0.375 mcg + norepe 4 mcg. CO-OX repeated with improvement.  - Echo 7/22 EF 30-35% in 2021 -> 25-30%.  - Back on dual inotropes, Milrinone 0.375 + NE 2. Co-ox 63% - Remains fluid overloaded despite lasix gtt at 30 and metoalzone Diuresis limited by recurrent AF and advanced CKD IV - Has failed DCCV x 2  - Will continue NE and milrinone at current dosages. On lasix gtt at 30/hr and 10 mg metolazone BID. Net even last 24 hrs. Weight up 3lb  ON. - She has had decent response to diuretics but remains significantly volume overloaded. Planning for CRRT for volume unloading. - Plan repeat DC-CV either later today or tomorrow after placing dialysis catheter  -No room for GDMT for now.   2. PAF--> Afib RVR - Developed shock and had urgent cardioversion 8/1 with conversion to NSR w/ ERAF - Repeat DCCV 8/4 w/ ERAF - c/w amio gtt at 60 hr  - Rate remains uncontrolled. Will arrange for DCCV later today or tomorrow am - unfortunately not  a candidate for ablation due to size. Not candidate for AVN ablation and CRT with likely need for HD soon. - Continue heparin gtt   3. AKI on CKD Stage IV -Recent Creatinine baseline 3.2 -> peaked at 4.2.  -Renal US with no acute findings. - SCr  4.1 -> 3.8 -> 3.9 -> 4.10 - BUN 98. No overt uremia - Difficult situation. As above, will need CRRT soon to remove fluid. Seems to be poor candidate for iHD with advanced HF unless EF recovers some with restoration of NSR.   4. OSA Uses CPAP   5. Hypothyroidism -TSH 8. T3 ok and T4 2.3  -On synthroid. Dose increased to 88 mcg.  -Repeat TSH in 6 weeks.    6. Gout - Uric  Acid 10. Pain RLE ? Flare  - On uloric 40 mg daily  - Completed 3 day course of prednisone    7. Hypokalemia  - 3.7 today - Monitor closely  8. Deconditioning - Continue PT/OT.  - needs HHPT. ? SNF  9. Hypervolemic Hyponatremia - Na 129>>132 > 131 > 132 > 129 - limit free H2O  10. Lupus - on low-dose prednisone.   Length of Stay: 8  FINCH, LINDSAY N, PA-C  11/28/2020, 8:09 AM  Advanced Heart Failure Team Pager [phone] (M-F; 7a - 5p)  Please contact [facility] for night-coverage after hours (5p -7a ) and weekends on [email or web address]  Agree with above.   Remains on milrinone and NE. On lasix gtt at 30. Received metolazone 10 bid with decent urine output but I/O even. Still markedly fluid overloaded. Remains in AF with RVR on IV amio and heparin. Scr up to  4.2  General:  Sitting up in chair No respiratory difficulty HEENT: normal Neck: supple. JVP to jaw Carotids 2+ bilat; no bruits. No lymphadenopathy or thryomegaly appreciated. Cor: PMI nondisplaced. Irregular tachy  No rubs, gallops or murmurs. Lungs: clear Abdomen: obese soft, nontender, nondistended. No hepatosplenomegaly. No bruits or masses. Good bowel sounds. Extremities: no cyanosis, clubbing, rash, 3+ edema Neuro: alert & orientedx3, cranial nerves grossly intact. moves all 4 extremities w/o difficulty. Affect pleasant  Remains markedly volume overloaded and not able to mobilize sufficient fluid due to AKI. D/w Renal and will plan to initiate CVVHD today. Continue NE and milrinone for support. Plan repeat DC-CV when volume status improved. Updated her daughter by phone. It is unclear if she will tolerate long-term HD.   CRITICAL CARE Performed by: Glori Bickers  Total critical care time: 45 minutes  Critical care time was exclusive of separately billable procedures and treating other patients.  Critical care was necessary to treat or prevent imminent or life-threatening deterioration.  Critical care was time spent personally by me (independent of midlevel providers or residents) on the following activities: development of treatment plan with patient and/or surrogate as well as nursing, discussions with consultants, evaluation of patient's response to treatment, examination of patient, obtaining history from patient or surrogate, ordering and performing treatments and interventions, ordering and review of laboratory studies, ordering and review of radiographic studies, pulse oximetry and re-evaluation of patient's condition.  Glori Bickers, MD  9:46 AM

## 2020-11-28 NOTE — Progress Notes (Signed)
PT Cancellation Note  Patient Details Name: Caitlyn Fuentes MRN: [id number] DOB: 01/30/1956   Cancelled Treatment:    Reason Eval/Treat Not Completed: Patient not medically ready.  Pt getting ready to go on CRRT presently.  Will likely hold today, for sure this morning. 11/28/2020  [name], PT Acute Rehabilitation Services [phone]  (pager) [phone]  (office)   [name] [name] 11/28/2020, 10:57 AM

## 2020-11-28 NOTE — Progress Notes (Signed)
Orthopedic Tech Progress Note Patient Details:  Caitlyn Fuentes [birth year] [id number]  Ortho Devices Type of Ortho Device: Haematologist Ortho Device/Splint Location: BLE Ortho Device/Splint Interventions: Ordered, Application, Adjustment   Post Interventions Patient Tolerated: Well Instructions Provided: [facility] of [name] 11/28/2020, 2:51 PM

## 2020-11-28 NOTE — Plan of Care (Signed)
  Problem: Activity: Goal: Capacity to carry out activities will improve Outcome: Progressing   Problem: Clinical Measurements: Goal: Ability to maintain clinical measurements within normal limits will improve Outcome: Progressing Goal: Respiratory complications will improve Outcome: Progressing   Problem: Activity: Goal: Risk for activity intolerance will decrease Outcome: Progressing   Problem: Nutrition: Goal: Adequate nutrition will be maintained Outcome: Progressing   Problem: Coping: Goal: Level of anxiety will decrease Outcome: Progressing   Problem: Pain Managment: Goal: General experience of comfort will improve Outcome: Progressing

## 2020-11-28 NOTE — Progress Notes (Signed)
HD catheter placement confirmed by CCM team. Ok to start CRRT.  Joellen Jersey, RN

## 2020-11-28 NOTE — Progress Notes (Signed)
[place] for Heparin Indication: atrial fibrillation  Patient Measurements: Height: 5' 3.5" (161.3 cm) Weight: 126.1 kg (278 lb) IBW/kg (Calculated) : 53.55 Heparin Dosing Weight: 85 kg  Vital Signs: Temp: 97.7 F (36.5 C) (08/08 0745) BP: 136/97 (08/08 0600) Pulse Rate: 60 (08/08 0600)  Labs: Recent Labs    11/26/20 0413 11/27/20 0313 11/28/20 0230  HGB 12.4 12.6 12.5  HCT 37.2 37.8 37.0  PLT 220 221 225  APTT 84* 72* 79*  HEPARINUNFRC 0.91* 0.68 0.59  CREATININE 3.81* 3.92* 4.10*     Estimated Creatinine Clearance: 18.1 mL/min (A) (by C-G formula based on SCr of 4.1 mg/dL (H)).   Medical History: Past Medical History:  Diagnosis Date   A-fib ([place])    Cardiomyopathy    Hyperlipidemia    Hypertension    Lupus ([place])    Pulmonary hypertension ([place]) 05/10/2011   Echo, EF-40-45   Renal disorder    Sleep apnea 2008   CPAP, pt does not know settings   Thyroid disease     Medications:     Assessment: 65 y.o. female with  h/o Afib, DOAC on hold, for heparin.  Pt was on Xarelto PTA and changed to Eliquis due to ARF.  Eliquis 5 mg last given at 2317 7/31.   aPTT therapeutic at 79 seconds on heparin drip 1500 uts/hr  CBC stable, heparin level also at goal 0.59. Pt s/p DCCV to NSR 8/4 back in Afib 8/5, continues on amiodarone at 60mg /hr. May need repeat cardioversion after HD line is placed.   Goal of Therapy:  Heparin level 0.3-0.7 units/ml aPTT 66-102 seconds Monitor platelets by anticoagulation protocol: Yes   Plan:  Continue heparin to 1500 units/h -Recheck CBC  aPTT and heparin level with daily labs  Erin Hearing PharmD., BCPS Clinical Pharmacist 11/28/2020 8:17 AM

## 2020-11-28 NOTE — Progress Notes (Signed)
Initial Nutrition Assessment  DOCUMENTATION CODES:   Not applicable  INTERVENTION:   No BM documented since admit-add scheduled regimen  Add ProSource Plus 30 ml BID, each supplement provides 100 kcals and 15 grams protein.  Add B complex with Vitamin C to account for losses with CRRT  NUTRITION DIAGNOSIS:   Increased nutrient needs related to acute illness as evidenced by estimated needs.  GOAL:   Patient will meet greater than or equal to 90% of their needs  MONITOR:   PO intake, Supplement acceptance, Weight trends, I & O's, Labs  REASON FOR ASSESSMENT:   Rounds    ASSESSMENT:   Patient with PMH significant for cardiomyopathy, HLD, HTN, pulmonary HTN, COPD, CKD IV, and gout. Presents this admission with CHF exacerbation.  08/1- afib RVR, cardiogenic shock, had urgent DCCV 08/4- s/p TEE, DCCV  Starting on CRRT. Remains on levophed.   Patient denies loss in appetite PTA. States she consumed 2-3 meals daily that consisted of good protein sources. Intake this admission has remained stable at 75-100%. Discussed the importance of protein intake for preservation of lean body mass while on CRRT. Patient willing to try PrSource.   Patient endorses a UBW of 260 lb and denies recent weight loss. Records show patient has maintained her weight over the last year.   UOP: 3140 ml x 24 hrs   Drips: 200 mg lasix in D5 @ 15 ml/hr, levophed Medications: calcitriol, SS novolog, 40 mEq Kcl daily, prednsione Labs: Na 129 (L)   NUTRITION - FOCUSED PHYSICAL EXAM:  Flowsheet Row Most Recent Value  Orbital Region No depletion  Upper Arm Region No depletion  Thoracic and Lumbar Region No depletion  Buccal Region No depletion  Temple Region No depletion  Clavicle Bone Region No depletion  Clavicle and Acromion Bone Region No depletion  Scapular Bone Region No depletion  Dorsal Hand No depletion  Patellar Region No depletion  Anterior Thigh Region No depletion  Posterior Calf  Region No depletion  Edema (RD Assessment) Moderate  Hair Reviewed  Eyes Reviewed  Mouth Reviewed  Skin Reviewed  Nails Reviewed      Diet Order:   Diet Order             Diet renal/carb modified with fluid restriction Diet-HS Snack? Nothing; Fluid restriction: 1200 mL Fluid; Room service appropriate? Yes; Fluid consistency: Thin  Diet effective now                   EDUCATION NEEDS:   Education needs have been addressed  Skin:  Skin Assessment: Reviewed RN Assessment  Last BM:  unknown  Height:   Ht Readings from Last 1 Encounters:  11/20/20 5' 3.5" (1.613 m)    Weight:   Wt Readings from Last 1 Encounters:  11/26/20 126.1 kg    BMI:  Body mass index is 48.47 kg/m.  Estimated Nutritional Needs:   Kcal:  1800-2000 kcal  Protein:  95-115 grams  Fluid:  >/= 1.8 L/day   Mariana Single MS, RD, LDN, CNSC Clinical Nutrition Pager listed in [facility]

## 2020-11-28 NOTE — Progress Notes (Signed)
OT Cancellation Note  Patient Details Name: Caitlyn Fuentes MRN: [id number] DOB: 07/07/1955   Cancelled Treatment:    Reason Eval/Treat Not Completed: Medical issues which prohibited therapy (Pt starting CRRT.)  Malka So 11/28/2020, 12:38 PM Nestor Lewandowsky, OTR/L Acute Rehabilitation Services Pager: [phone] Office: [phone]

## 2020-11-28 NOTE — Progress Notes (Signed)
  Asked to see patient due to low BP.  Started on CVVHD earlier today. Currently pulling 75cc/hr. Lasix gtt and metolazone stopped.   On NE 2 and milrinone 0.375  Remains in AF with RVR despite IV amio  Current MAPs in low to mid 97I with systolics high 71E to low 90s.   Denies SOB or lightheadedness.   No bleeding at HD cath site.  Will open up limits on NE to go up to 10 as needed shoot for MAP >= 53mm HG.   PM labs reviewed and ok.   Additional CCT 35 mins   Glori Bickers, MD  7:09 PM

## 2020-11-28 NOTE — CV Procedure (Signed)
Central Venous Catheter Insertion Procedure Note Elyssia Strausser [id number] Mar 10, 1956    Procedure: Insertion of Central Venous Catheter Indications: Hemodialysis   Procedure Details Consent: Risks of procedure as well as the alternatives and risks of each were explained to the (patient/caregiver).  Consent for procedure obtained. Time Out: Verified patient identification, verified procedure, site/side was marked, verified correct patient position, special equipment/implants available, medications/allergies/relevent history reviewed, required imaging and test results available.  Performed   Maximum sterile technique was used including antiseptics, cap, gloves, gown, hand hygiene, mask and sheet. Skin prep: Chlorhexidine; local anesthetic administered A antimicrobial bonded/coated triple lumen 13 FR catheter was placed in the right internal jugular vein using the Seldinger technique and u/s guidance.   Evaluation Blood flow good Complications: No apparent complications Patient did tolerate procedure well. Chest X-ray ordered to verify placement.  CXR: ok   Glori Bickers, MD  1:13 PM

## 2020-11-28 NOTE — Progress Notes (Signed)
Patient ID: Caitlyn Fuentes, female   DOB: [birth year], 65 y.o.   MRN: [id number] [facility] Progress Note    Assessment/ Plan:    1. Acute kidney Injury on chronic kidney disease stage IV: Nonoliguric and ongoing furosemide augmented earlier with the addition of metolazone.  - She will need CRRT for volume unloading/azotemia management.  CHF team is placing a dialysis catheter today and agrees with CRRT.  CRRT orders placed   2.  Acute exacerbation of congestive heart failure: On milrinone for inotropic support along with norepinephrine for cardiogenic shock component.  Optimize volume as above   3.  Hypokalemia: Secondary to urinary losses and limited intake  4.  Atrial fibrillation with rapid ventricular response: RVR with decreased cardiac output likely contributing to renal hypoperfusion/dysfunction.  5.  Hyponatremia: Secondary to acute exacerbation of congestive heart failure/worsening renal function and impaired free water handling.  Continue to monitor with diuresis   Subjective:   patient had 3.1 liters over 8/7 charted.  She however had 3.2 liters intake so was actually slightly net positive.  She has been on metolazone and lasix at 30 mg/hr  Review of systems She reports shortness of breath but states this is much better than before  Denies n/v Denies chest pain   Background on consult:  65 year old African-American woman with past medical history significant for chronic congestive heart failure with reduced ejection fraction (EF 45%), nonischemic cardiomyopathy, hypothyroidism, hypertension, dyslipidemia, obstructive sleep apnea, obesity, systemic lupus erythematosus and chronic kidney disease stage IV at baseline (baseline creatinine apparently around 2.0) who presented to the hospital with worsening shortness of breath, lower extremity edema, palpitations and dyspnea on exertion.  She was found to have volume overload with pulmonary edema along with atrial  patient with RVR and was admitted to the ICU for cardiogenic shock.  Nephrology was originally consulted for management of her oliguric acute kidney injury secondary to cardiogenic shock/cardiorenal syndrome and appears to have been sluggishly improving at the time of sign off (creatinine down to 3.5 from 4.2) with decent diuresis.  Unfortunately, she remained on Lasix drip with suboptimal urine output and worsening renal function (BUN 96/creatinine 3.9) likely in part from recurrence of atrial fibrillation with RVR following DCCV.  She remains on milrinone and Levophed drip with elevated CVP of 22 cm of water.     Objective:   BP (!) 136/97   Pulse 60   Temp 97.7 F (36.5 C)   Resp 17   Ht 5' 3.5" (1.613 m)   Wt 126.1 kg   SpO2 92%   BMI 48.47 kg/m   Intake/Output Summary (Last 24 hours) at 11/28/2020 0720 Last data filed at 11/28/2020 0700 Gross per 24 hour  Intake 3227.09 ml  Output 3140 ml  Net 87.09 ml   Weight change:   Physical Exam:   General adult female in bed in no acute distress seated in chair HEENT normocephalic atraumatic extraocular movements intact sclera anicteric Neck supple trachea midline Lungs clear to auscultation bilaterally normal work of breathing at rest; on 1 liter oxygen Heart S1S2 no rub Abdomen soft nontender nondistended obese habitus Extremities 2+ edema with legs wrapped Psych normal mood and affect Neuro - alert and oriented x 3 provides hx and follows commands  Imaging: No results found.  Labs: BMET Recent Labs  Lab 11/22/20 1400 11/23/20 0449 11/23/20 1310 11/24/20 0502 11/24/20 1411 11/25/20 0011 11/25/20 0419 11/26/20 0413 11/27/20 0313 11/28/20 0230  NA 129*   < > 131* 129*  131* 133* 132* 131* 132* 129*  K 3.5   < > 4.4 3.7 3.9 3.6 3.6 3.1* 3.1* 3.7  CL 89*   < > 88* 86* 89* 88* 88* 88* 88* 86*  CO2 25   < > 30 26 29 31 30 29 29 28   GLUCOSE 201*   < > 158* 189* 169* 135* 135* 122* 123* 133*  BUN 91*   < > 88* 93* 93* 96*  97* 97* 96* 98*  CREATININE 3.82*   < > 3.81* 4.16* 4.00* 4.13* 4.10* 3.81* 3.92* 4.10*  CALCIUM 9.4   < > 9.4 10.0 9.4 9.8 9.6 9.5 9.6 9.5  PHOS 2.9  --  2.8  --  3.3  --   --   --   --   --    < > = values in this interval not displayed.   CBC Recent Labs  Lab 11/25/20 0419 11/26/20 0413 11/27/20 0313 11/28/20 0230  WBC 9.4 9.8 9.6 8.7  HGB 12.0 12.4 12.6 12.5  HCT 35.4* 37.2 37.8 37.0  MCV 95.9 96.9 96.9 96.9  PLT 237 220 221 225    Medications:     calcitRIOL  0.25 mcg Oral Daily   Chlorhexidine Gluconate Cloth  6 each Topical Daily   clotrimazole  1 application Topical BID   estradiol  1 mg Oral Daily   febuxostat  40 mg Oral q AM   ferrous sulfate  325 mg Oral q morning   fluticasone  2 spray Each Nare Daily   fluticasone furoate-vilanterol  1 puff Inhalation Daily   gabapentin  100 mg Oral TID   insulin aspart  0-6 Units Subcutaneous TID WC   levothyroxine  88 mcg Oral q morning   lidocaine  1 patch Transdermal Q24H   metolazone  10 mg Oral BID   potassium chloride  40 mEq Oral Daily   predniSONE  5 mg Oral Q breakfast   sodium chloride flush  10-40 mL Intracatheter Q12H   sodium chloride flush  3 mL Intravenous Q12H      [name], MD 11/28/2020,  7:46 AM

## 2020-11-29 ENCOUNTER — Inpatient Hospital Stay (HOSPITAL_COMMUNITY): Payer: Self-pay | Admitting: Anesthesiology

## 2020-11-29 ENCOUNTER — Encounter (HOSPITAL_COMMUNITY): Payer: Self-pay | Admitting: Anesthesiology

## 2020-11-29 DIAGNOSIS — I4891 Unspecified atrial fibrillation: Secondary | ICD-10-CM | POA: Diagnosis not present

## 2020-11-29 DIAGNOSIS — R570 Cardiogenic shock: Secondary | ICD-10-CM | POA: Diagnosis not present

## 2020-11-29 LAB — CBC
HCT: 39.5 % (ref 36.0–46.0)
Hemoglobin: 12.9 g/dL (ref 12.0–15.0)
MCH: 32 pg (ref 26.0–34.0)
MCHC: 32.7 g/dL (ref 30.0–36.0)
MCV: 98 fL (ref 80.0–100.0)
Platelets: 242 10*3/uL (ref 150–400)
RBC: 4.03 MIL/uL (ref 3.87–5.11)
RDW: 14 % (ref 11.5–15.5)
WBC: 7.6 10*3/uL (ref 4.0–10.5)
nRBC: 0 % (ref 0.0–0.2)

## 2020-11-29 LAB — RENAL FUNCTION PANEL
Albumin: 3.1 g/dL — ABNORMAL LOW (ref 3.5–5.0)
Albumin: 3.1 g/dL — ABNORMAL LOW (ref 3.5–5.0)
Anion gap: 12 (ref 5–15)
Anion gap: 10 (ref 5–15)
BUN: 50 mg/dL — ABNORMAL HIGH (ref 8–23)
BUN: 33 mg/dL — ABNORMAL HIGH (ref 8–23)
CO2: 26 mmol/L (ref 22–32)
CO2: 25 mmol/L (ref 22–32)
Calcium: 9.1 mg/dL (ref 8.9–10.3)
Calcium: 9 mg/dL (ref 8.9–10.3)
Chloride: 95 mmol/L — ABNORMAL LOW (ref 98–111)
Chloride: 98 mmol/L (ref 98–111)
Creatinine, Ser: 2.36 mg/dL — ABNORMAL HIGH (ref 0.44–1.00)
Creatinine, Ser: 1.9 mg/dL — ABNORMAL HIGH (ref 0.44–1.00)
GFR, Estimated: 22 mL/min — ABNORMAL LOW (ref >60)
GFR, Estimated: 29 mL/min — ABNORMAL LOW (ref >60)
Glucose, Bld: 138 mg/dL — ABNORMAL HIGH (ref 70–99)
Glucose, Bld: 149 mg/dL — ABNORMAL HIGH (ref 70–99)
Phosphorus: 3 mg/dL (ref 2.5–4.6)
Phosphorus: 2.2 mg/dL — ABNORMAL LOW (ref 2.5–4.6)
Potassium: 3.8 mmol/L (ref 3.5–5.1)
Potassium: 4.4 mmol/L (ref 3.5–5.1)
Sodium: 133 mmol/L — ABNORMAL LOW (ref 135–145)
Sodium: 133 mmol/L — ABNORMAL LOW (ref 135–145)

## 2020-11-29 LAB — MAGNESIUM: Magnesium: 2.4 mg/dL (ref 1.7–2.4)

## 2020-11-29 LAB — HEPARIN LEVEL (UNFRACTIONATED): Heparin Unfractionated: 0.63 IU/mL (ref 0.30–0.70)

## 2020-11-29 LAB — GLUCOSE, CAPILLARY
Glucose-Capillary: 126 mg/dL — ABNORMAL HIGH (ref 70–99)
Glucose-Capillary: 176 mg/dL — ABNORMAL HIGH (ref 70–99)
Glucose-Capillary: 190 mg/dL — ABNORMAL HIGH (ref 70–99)
Glucose-Capillary: 99 mg/dL (ref 70–99)

## 2020-11-29 LAB — COOXEMETRY PANEL
Carboxyhemoglobin: 1.6 % — ABNORMAL HIGH (ref 0.5–1.5)
Methemoglobin: 1 % (ref 0.0–1.5)
O2 Saturation: 54.5 %
Total hemoglobin: 12.8 g/dL (ref 12.0–16.0)

## 2020-11-29 LAB — APTT: aPTT: 69 seconds — ABNORMAL HIGH (ref 24–36)

## 2020-11-29 MED ORDER — MIDODRINE HCL 5 MG PO TABS
15.0000 mg | ORAL_TABLET | Freq: Three times a day (TID) | ORAL | Status: DC
  Administered 2020-11-29 – 2020-12-19 (×60): 15 mg via ORAL
  Filled 2020-11-29 (×52): qty 3

## 2020-11-29 MED ORDER — HYDROCORTISONE 1 % EX CREA
1.0000 "application " | TOPICAL_CREAM | Freq: Three times a day (TID) | CUTANEOUS | Status: DC | PRN
  Filled 2020-11-29: qty 28

## 2020-11-29 MED ORDER — OXYCODONE HCL 5 MG PO TABS
5.0000 mg | ORAL_TABLET | Freq: Four times a day (QID) | ORAL | Status: DC | PRN
  Administered 2020-12-10 – 2020-12-14 (×9): 5 mg via ORAL
  Filled 2020-11-29 (×9): qty 1

## 2020-11-29 MED ORDER — MIDODRINE HCL 5 MG PO TABS
10.0000 mg | ORAL_TABLET | Freq: Three times a day (TID) | ORAL | Status: DC
  Administered 2020-11-29: 10 mg via ORAL
  Filled 2020-11-29: qty 2

## 2020-11-29 NOTE — Progress Notes (Signed)
[facility] for Heparin Indication: atrial fibrillation  Patient Measurements: Height: 5' 3.5" (161.3 cm) Weight: 125.2 kg (276 lb 0.3 oz) IBW/kg (Calculated) : 53.55 Heparin Dosing Weight: 85 kg  Vital Signs: Temp: 97.6 F (36.4 C) (08/09 0300) Temp Source: Oral (08/08 2327) BP: 85/68 (08/09 0705) Pulse Rate: 64 (08/09 0708)  Labs: Recent Labs    11/27/20 0313 11/28/20 0230 11/28/20 1603 11/29/20 0414  HGB 12.6 12.5  --  12.9  HCT 37.8 37.0  --  39.5  PLT 221 225  --  242  APTT 72* 79*  --  69*  HEPARINUNFRC 0.68 0.59  --  0.63  CREATININE 3.92* 4.10* 3.22* 2.36*     Estimated Creatinine Clearance: 31.3 mL/min (A) (by C-G formula based on SCr of 2.36 mg/dL (H)).   Medical History: Past Medical History:  Diagnosis Date   A-fib ([place])    Cardiomyopathy    Hyperlipidemia    Hypertension    Lupus ([place])    Pulmonary hypertension ([place]) 05/10/2011   Echo, EF-40-45   Renal disorder    Sleep apnea 2008   CPAP, pt does not know settings   Thyroid disease     Medications:     Assessment: 65 y.o. female with  h/o Afib, DOAC on hold, for heparin.  Pt was on Xarelto PTA and changed to Eliquis due to ARF.  Eliquis 5 mg last given at 2317 7/31.   aPTT therapeutic at 69 seconds on heparin drip 1500 uts/hr  CBC stable, heparin level also at goal 0.63 Pt s/p DCCV to NSR 8/4 back in Afib 8/5, continues on amiodarone at 60mg /hr.  Goal of Therapy:  Heparin level 0.3-0.7 units/ml aPTT 66-102 seconds Monitor platelets by anticoagulation protocol: Yes   Plan:  Continue heparin to 1500 units/h -Recheck CBC  aPTT and heparin level with daily labs  Erin Hearing PharmD., BCPS Clinical Pharmacist 11/29/2020 8:18 AM

## 2020-11-29 NOTE — Progress Notes (Addendum)
Systolic pressures in 44P this afternoon on 0.25 milrinone and 20 norepi.  On CRRT with volume removal at 100 ml/hr  Afib with rates 120s-130s  Increased midodrine to 15 mg TID

## 2020-11-29 NOTE — Progress Notes (Signed)
Physical Therapy Treatment Patient Details Name: Caitlyn Fuentes MRN: [id number] DOB: October 28, 1955 Today's Date: 11/29/2020    History of Present Illness Pt is a 65 y/o female presenting on 11/20/20 with worsening palpitations, SOB and LE edema. Pt found in rapid a fib, chest x-ray with significant vascular congestion. 8/1 hypotensive with cardiogenic shock, transferred to ICU and urgent cardioversion.   S/P TEE and cardioversion 8/4. PMH includes: afib, cardiomyopathy, HTN, lupus, PAF, CKD.    PT Comments    Pt now on CRRT, fatigued, but has spent hours OOB in the chair.  Emphasis on sit to stands, transfers, pre gait activity EOB while CRRT rearranged and transition to supine in the bed.    Follow Up Recommendations  CIR;Supervision/Assistance - 24 hour;Other (comment)     Equipment Recommendations  Other (comment) (TBA)    Recommendations for Other Services       Precautions / Restrictions Precautions Precautions: Fall Precaution Comments: watch HR    Mobility  Bed Mobility Overal bed mobility: Needs Assistance Bed Mobility: Sit to Supine       Sit to supine: Mod assist;+2 for physical assistance        Transfers Overall transfer level: Needs assistance   Transfers: Sit to/from Stand;Stand Pivot Transfers Sit to Stand: Min assist;Mod assist Stand pivot transfers: Min assist       General transfer comment: face to face assist to help come forward, boost and pivot over to the bed.  Ambulation/Gait             General Gait Details: pivotal only   Marine scientist Rank[place] (Stroke Patients Only)       Balance     Sitting balance-Leahy Scale: Fair       Standing balance-Leahy Scale: Poor Standing balance comment: reliant on UE support                            Cognition Arousal/Alertness: Awake/alert Behavior During Therapy: WFL for tasks assessed/performed Overall Cognitive Status:  Within Functional Limits for tasks assessed                                        Exercises      General Comments General comments (skin integrity, edema, etc.): On CRRT, fatigued, EHR labile and up into the 150's  (afib)      Pertinent Vitals/Pain      Home Living                      Prior Function            PT Goals (current goals can now be found in the care plan section) Acute Rehab PT Goals Patient Stated Goal: to get better PT Goal Formulation: With patient Time For Goal Achievement: 12/07/20 Potential to Achieve Goals: Good Progress towards PT goals: Progressing toward goals    Frequency    Min 3X/week      PT Plan Current plan remains appropriate    Co-evaluation              AM-PAC PT "6 Clicks" Mobility   Outcome Measure  Help needed turning from your back to your side while in a flat bed without using bedrails?: A Little Help needed moving  from lying on your back to sitting on the side of a flat bed without using bedrails?: A Lot Help needed moving to and from a bed to a chair (including a wheelchair)?: A Little Help needed standing up from a chair using your arms (e.g., wheelchair or bedside chair)?: A Little Help needed to walk in hospital room?: A Lot Help needed climbing 3-5 steps with a railing? : A Lot 6 Click Score: 15    End of Session Equipment Utilized During Treatment: Oxygen Activity Tolerance: Patient tolerated treatment well;Patient limited by fatigue Patient left: in bed;with call bell/phone within reach;with bed alarm set;with family/visitor present;with nursing/sitter in room Nurse Communication: Mobility status PT Visit Diagnosis: Other abnormalities of gait and mobility (R26.89);Muscle weakness (generalized) (M62.81);Unsteadiness on feet (R26.81)     Time: 9476-5465 PT Time Calculation (min) (ACUTE ONLY): 16 min  Charges:  $Therapeutic Activity: 8-22 mins                      11/29/2020  [name], PT Acute Rehabilitation Services [phone]  (pager) [phone]  (office)   [name] Ahsan Esterline 11/29/2020, 5:17 PM

## 2020-11-29 NOTE — Progress Notes (Signed)
OT Cancellation Note  Patient Details Name: Caitlyn Fuentes MRN: [id number] DOB: [birth year]   Cancelled Treatment:    Reason Eval/Treat Not Completed: Patient not medically ready-- pt in chair on CRRT in afib with HR up to 150s.  Plan for cardioversion tomorrow and OT will check on her tomorrow as able.   Jolaine Artist, OT Acute Rehabilitation Services Pager [phone] Office [place] 11/29/2020, 11:30 AM

## 2020-11-29 NOTE — Progress Notes (Signed)
Patient ID: Caitlyn Fuentes, female   DOB: 02-20-1956, 65 y.o.   MRN: [id number] [facility] Progress Note    Assessment/ Plan:    1. Acute kidney Injury on chronic kidney disease stage IV: Nonoliguric and ongoing furosemide augmented earlier with the addition of metolazone.  CRRT started on 8/8 after nontunneled catheter with CHF team for volume management and clearance - Continue CRRT with UF goal net neg 50 to 100 ml/hr - 4K fluids  2.  Acute exacerbation of congestive heart failure: On milrinone for inotropic support along with norepinephrine  Optimize volume as above   3.  Hypokalemia: Secondary to urinary losses   4.  Atrial fibrillation with rapid ventricular response: RVR with decreased cardiac output likely contributing to renal hypoperfusion/dysfunction.  5.  Hyponatremia: Secondary to acute exacerbation of congestive heart failure/worsening renal function and impaired free water handling.  Improved with CRRT   Subjective:   patient had 1.7 liters uop over 8/8 charted.  She had 2.6 kg UF over 8/8 with CRRT. Lasix gtt and metolazone stopped on 8/8 afternoon.  she may have a cardioversion today per nursing - CHF to decide.  Just clotted.  Review of systems  She reports shortness of breath is better today  Denies n/v Denies chest pain   Background on consult:  65 year old African-American woman with past medical history significant for chronic congestive heart failure with reduced ejection fraction (EF 45%), nonischemic cardiomyopathy, hypothyroidism, hypertension, dyslipidemia, obstructive sleep apnea, obesity, systemic lupus erythematosus and chronic kidney disease stage IV at baseline (baseline creatinine apparently around 2.0) who presented to the hospital with worsening shortness of breath, lower extremity edema, palpitations and dyspnea on exertion.  She was found to have volume overload with pulmonary edema along with atrial patient with RVR and was admitted  to the ICU for cardiogenic shock.  Nephrology was originally consulted for management of her oliguric acute kidney injury secondary to cardiogenic shock/cardiorenal syndrome and appears to have been sluggishly improving at the time of sign off (creatinine down to 3.5 from 4.2) with decent diuresis.  Unfortunately, she remained on Lasix drip with suboptimal urine output and worsening renal function (BUN 96/creatinine 3.9) likely in part from recurrence of atrial fibrillation with RVR following DCCV.  She remains on milrinone and Levophed drip with elevated CVP of 22 cm of water.     Objective:   BP 105/74   Pulse (!) 135   Temp 97.6 F (36.4 C)   Resp (!) 24   Ht 5' 3.5" (1.613 m)   Wt 125.2 kg   SpO2 96%   BMI 48.13 kg/m   Intake/Output Summary (Last 24 hours) at 11/29/2020 3570 Last data filed at 11/29/2020 0645 Gross per 24 hour  Intake 2441.75 ml  Output 4589 ml  Net -2147.25 ml   Weight change:   Physical Exam:   General adult female in bed in no acute distress seated in chair HEENT normocephalic atraumatic extraocular movements intact sclera anicteric Neck supple trachea midline Lungs clear to auscultation bilaterally normal work of breathing at rest; on room air  Heart S1S2 no rub Abdomen soft nontender nondistended obese habitus Extremities 1+ edema with legs wrapped Psych normal mood and affect Neuro - alert and oriented x 3 provides hx and follows commands Access RIJ nontunneled dialysis catheter  Imaging: DG CHEST PORT 1 VIEW  Result Date: 11/28/2020 CLINICAL DATA:  Central line placement EXAM: PORTABLE CHEST 1 VIEW COMPARISON:  11/21/2020 FINDINGS: Interval placement of right IJ catheter with  tip at the distal SVC. No pneumothorax identified. Unchanged appearance of left IJ catheter with tip also in the distal SVC. Cardiac enlargement and pulmonary vascular congestion is again noted. Opacification within the left lower lung is unchanged and may represent pneumonia.  IMPRESSION: Interval placement of right IJ catheter with tip at the distal SVC. No pneumothorax. Electronically Signed   By: Kerby Moors M.D.   On: 11/28/2020 14:21    Labs: BMET Recent Labs  Lab 11/22/20 1400 11/23/20 0449 11/23/20 1310 11/24/20 0502 11/24/20 1411 11/25/20 0011 11/25/20 0419 11/26/20 0413 11/27/20 0313 11/28/20 0230 11/28/20 1603 11/29/20 0414  NA 129*   < > 131*   < > 131* 133* 132* 131* 132* 129* 130* 133*  K 3.5   < > 4.4   < > 3.9 3.6 3.6 3.1* 3.1* 3.7 3.8 3.8  CL 89*   < > 88*   < > 89* 88* 88* 88* 88* 86* 89* 95*  CO2 25   < > 30   < > 29 31 30 29 29 28 28 26   GLUCOSE 201*   < > 158*   < > 169* 135* 135* 122* 123* 133* 167* 138*  BUN 91*   < > 88*   < > 93* 96* 97* 97* 96* 98* 73* 50*  CREATININE 3.82*   < > 3.81*   < > 4.00* 4.13* 4.10* 3.81* 3.92* 4.10* 3.22* 2.36*  CALCIUM 9.4   < > 9.4   < > 9.4 9.8 9.6 9.5 9.6 9.5 9.1 9.1  PHOS 2.9  --  2.8  --  3.3  --   --   --   --   --  3.6 3.0   < > = values in this interval not displayed.   CBC Recent Labs  Lab 11/26/20 0413 11/27/20 0313 11/28/20 0230 11/29/20 0414  WBC 9.8 9.6 8.7 7.6  HGB 12.4 12.6 12.5 12.9  HCT 37.2 37.8 37.0 39.5  MCV 96.9 96.9 96.9 98.0  PLT 220 221 225 242    Medications:     (feeding supplement) PROSource Plus  30 mL Oral BID BM   B-complex with vitamin C  1 tablet Oral Daily   calcitRIOL  0.25 mcg Oral Daily   Chlorhexidine Gluconate Cloth  6 each Topical Daily   clotrimazole  1 application Topical BID   estradiol  1 mg Oral Daily   febuxostat  40 mg Oral q AM   ferrous sulfate  325 mg Oral q morning   fluticasone  2 spray Each Nare Daily   fluticasone furoate-vilanterol  1 puff Inhalation Daily   gabapentin  100 mg Oral TID   insulin aspart  0-6 Units Subcutaneous TID WC   levothyroxine  88 mcg Oral q morning   lidocaine  1 patch Transdermal Q24H   potassium chloride  40 mEq Oral Daily   predniSONE  5 mg Oral Q breakfast   sodium chloride flush  10-40 mL  Intracatheter Q12H   sodium chloride flush  3 mL Intravenous Q12H      [name], MD 11/29/2020,  7:09 AM

## 2020-11-29 NOTE — Progress Notes (Signed)
   11/29/20 0910  Clinical Encounter Type  Visited With Patient  Visit Type Initial;Follow-up  Referral From Nurse  Consult/Referral To Chaplain  Chaplain visited with Caitlyn Fuentes to review her HCPOA paperwork.  All was filled out correctly and I set up an appointment with [name] to have notarized.   Chaplain Jemeka Wagler [place] [phone]

## 2020-11-29 NOTE — Progress Notes (Signed)
Advanced Heart Failure Rounding Note  PCP-Cardiologist: Shelva Majestic, MD  HF clinic: Dr. Jeffie Pollock  Subjective:   - 8/1 A fib RVR & A/C systolic heart faiure --> cardiogenic shock. Had urgent DC-CV with brief conversion to NSR but went back in A fib. . On Norepi + milrinone. AKI. Nephrology consulted.  - 8/2 A fib RVR, amio gtt. Continued on milrinone 0.375 mcg + norepi 2 mcg. Started on prednisone 40 mg daily x3 days for gout flare.  - 8/3 did not tolerate milrinone wean w/ drop in Co-ox 68%--->48% and decrease in UOP. Milrinone increased back to 0.375.  Lasix gtt increased to 30/hr.  - 8/4 NE added back given persistently low Co-ox - 8/4 s/p TEE/DCCV>>NSR  - 8/5 back in Afib w/ RVR  -  08/09 HD cath placed, CRRT started  Remains in AF. Rates 110s -120s  Remains on milrinone 0.375 + NE 6. Co-ox 63% > 56.2% > 54%. SCr 4.1 -> 3.8 -> 3.9 -> 4.10 > 2.36. CVP 15  -4L last 24 hrs with combination of urine output and UF   Objective:   Weight Range: 125.2 kg Body mass index is 48.13 kg/m.   Vital Signs:   Temp:  [97.4 F (36.3 C)-97.8 F (36.6 C)] 97.6 F (36.4 C) (08/09 0300) Pulse Rate:  [43-143] 64 (08/09 0708) Resp:  [13-33] 23 (08/09 0708) BP: (71-135)/(27-111) 85/68 (08/09 0705) SpO2:  [91 %-100 %] 94 % (08/09 0708) Weight:  [125.2 kg] 125.2 kg (08/09 0500) Last BM Date:  (PTA)  Weight change: Filed Weights   11/25/20 0500 11/26/20 0500 11/29/20 0500  Weight: 125 kg 126.1 kg 125.2 kg    Intake/Output:   Intake/Output Summary (Last 24 hours) at 11/29/2020 0811 Last data filed at 11/29/2020 0800 Gross per 24 hour  Intake 2295.22 ml  Output 4014 ml  Net -1718.78 ml     Physical Exam  CVP 13 General:  Sitting up No resp difficulty HEENT: normal Neck: supple. +JVD.  Carotids 2+ bilat; no bruits. No lymphadenopathy or thryomegaly appreciated. Cor: PMI nondisplaced. Irregular, tachycardic. No rubs, gallops or murmurs. Lungs: clear Abdomen: obese soft,  nontender, nondistended. No hepatosplenomegaly. No bruits or masses. Good bowel sounds. Extremities: no cyanosis, clubbing, rash, 3+ edema Neuro: alert & orientedx3, cranial nerves grossly intact. moves all 4 extremities w/o difficulty. Affect pleasant   Telemetry   Afib, 120s-130s, personally reviewed.    Labs    CBC Recent Labs    11/28/20 0230 11/29/20 0414  WBC 8.7 7.6  HGB 12.5 12.9  HCT 37.0 39.5  MCV 96.9 98.0  PLT 225 734   Basic Metabolic Panel Recent Labs    11/28/20 0230 11/28/20 1603 11/29/20 0414  NA 129* 130* 133*  K 3.7 3.8 3.8  CL 86* 89* 95*  CO2 28 28 26   GLUCOSE 133* 167* 138*  BUN 98* 73* 50*  CREATININE 4.10* 3.22* 2.36*  CALCIUM 9.5 9.1 9.1  MG 1.8  --  2.4  PHOS  --  3.6 3.0   Liver Function Tests Recent Labs    11/28/20 1603 11/29/20 0414  ALBUMIN 3.0* 3.1*   No results for input(s): LIPASE, AMYLASE in the last 72 hours. Cardiac Enzymes No results for input(s): CKTOTAL, CKMB, CKMBINDEX, TROPONINI in the last 72 hours.  BNP: BNP (last 3 results) Recent Labs    11/20/20 0318 11/21/20 0258  BNP 2,051.3* 2,022.4*    ProBNP (last 3 results) No results for input(s): PROBNP in the last 8760  hours.   D-Dimer No results for input(s): DDIMER in the last 72 hours. Hemoglobin A1C No results for input(s): HGBA1C in the last 72 hours.  Fasting Lipid Panel No results for input(s): CHOL, HDL, LDLCALC, TRIG, CHOLHDL, LDLDIRECT in the last 72 hours. Thyroid Function Tests No results for input(s): TSH, T4TOTAL, T3FREE, THYROIDAB in the last 72 hours.  Invalid input(s): FREET3   Other results:   Imaging    DG CHEST PORT 1 VIEW  Result Date: 11/28/2020 CLINICAL DATA:  Central line placement EXAM: PORTABLE CHEST 1 VIEW COMPARISON:  11/21/2020 FINDINGS: Interval placement of right IJ catheter with tip at the distal SVC. No pneumothorax identified. Unchanged appearance of left IJ catheter with tip also in the distal SVC. Cardiac  enlargement and pulmonary vascular congestion is again noted. Opacification within the left lower lung is unchanged and may represent pneumonia. IMPRESSION: Interval placement of right IJ catheter with tip at the distal SVC. No pneumothorax. Electronically Signed   By: Kerby Moors M.D.   On: 11/28/2020 14:21     Medications:     Scheduled Medications:  (feeding supplement) PROSource Plus  30 mL Oral BID BM   B-complex with vitamin C  1 tablet Oral Daily   calcitRIOL  0.25 mcg Oral Daily   Chlorhexidine Gluconate Cloth  6 each Topical Daily   clotrimazole  1 application Topical BID   estradiol  1 mg Oral Daily   febuxostat  40 mg Oral q AM   ferrous sulfate  325 mg Oral q morning   fluticasone  2 spray Each Nare Daily   fluticasone furoate-vilanterol  1 puff Inhalation Daily   gabapentin  100 mg Oral TID   insulin aspart  0-6 Units Subcutaneous TID WC   levothyroxine  88 mcg Oral q morning   lidocaine  1 patch Transdermal Q24H   potassium chloride  40 mEq Oral Daily   predniSONE  5 mg Oral Q breakfast   sodium chloride flush  10-40 mL Intracatheter Q12H   sodium chloride flush  3 mL Intravenous Q12H    Infusions:   prismasol BGK 4/2.5 400 mL/hr at 11/29/20 0729    prismasol BGK 4/2.5 300 mL/hr at 11/29/20 0160   sodium chloride     amiodarone 60 mg/hr (11/29/20 0800)   heparin 1,500 Units/hr (11/29/20 0800)   milrinone 0.375 mcg/kg/min (11/29/20 0800)   norepinephrine (LEVOPHED) Adult infusion 6 mcg/min (11/29/20 0800)   prismasol BGK 4/2.5 2,000 mL/hr at 11/29/20 0418    PRN Medications: sodium chloride, albuterol, cyclobenzaprine, heparin, HYDROcodone-acetaminophen, ondansetron (ZOFRAN) IV, oxyCODONE, sodium chloride flush, sodium chloride flush    Patient Profile   Caitlyn Fuentes is a 65 year old with a history of chronic systolic hf previously EF 45%,  NICM, hypothyroid, PAF, gout, OSA, CKD Stage IV, HTN, hyperlipidemia, and lupus.   Admitted with A fib RVR and A/C  systolic heart failure complicated by cardiogenic shock.     Assessment/Plan    1. Acute/Chronic Systolic Heart Failure Cardiogenic shock - 8/1 Lactic acid 1.5. CO-OX 43%.--> milrinone increased 0.375 mcg + norepe 4 mcg. CO-OX repeated with improvement.  - Echo 7/22 EF 30-35% in 2021 -> 25-30%.  - Back on dual inotropes, Milrinone 0.375 + NE 6. Co-ox 54% - Remains fluid overloaded despite lasix gtt at 30 and metoalzone Diuresis limited by recurrent AF and advanced CKD IV - Has failed DCCV x 2  - 4L ON with CRRT and urine output. Weight down 2lb.   -No  room for GDMT for now.   2. PAF--> Afib RVR - Developed shock and had urgent cardioversion 8/1 with conversion to NSR w/ ERAF - Repeat DCCV 8/4 w/ ERAF - c/w amio gtt at 60 hr  - Rate remains uncontrolled. Will arrange for DCCV after volume removal - unfortunately not a candidate for ablation due to size. Not candidate for AVN ablation and CRT. - Continue heparin gtt   3. AKI on CKD Stage IV -Recent Creatinine baseline 3.2  - SCr  4.1 -> 3.8 -> 3.9 -> 4.10 > 3.22 > 2.36 - BUN improving, 98 > 50 - Renal US with no acute findings. - CRRT with UF goal net neg 50-100 ml/hr per nephrology, appreciate input - May be poor candidate for iHD with advanced HF unless EF recovers some with restoration of NSR.   4. OSA Uses CPAP   5. Hypothyroidism -TSH 8. T3 ok and T4 2.3  -On synthroid. Dose increased to 88 mcg.  -Repeat TSH in 6 weeks.    6. Gout - Uric  Acid 10. Pain RLE ? Flare  - On uloric 40 mg daily  - Completed 3 day course of prednisone    7. Hypokalemia  - 3.8 today - Monitor closely  8. Deconditioning - Continue PT/OT.  - needs HHPT. ? SNF  9. Hypervolemic Hyponatremia - Na 129>>132 > 131 > 132 > 129  > 130 > 133 - limit free H2O  10. Lupus - on low-dose prednisone.   Length of Stay: 9  FINCH, Caitlyn N, PA-C  11/29/2020, 8:11 AM  Advanced Heart Failure Team Pager [phone] (M-F; 7a - 5p)  Please contact  [facility] for night-coverage after hours (5p -7a ) and weekends on [email or web address]  Agree with above.   Now on CRRT. Negative 4L overnight but requiring increasing doses of NE. SBP remains in 80s on NE 10. CVP 15. Co-ox 54%  Breathing better. Remains in AF with RVR despite IV amio.   General:  Sitting in chair. Weak appearing. No resp difficulty HEENT: normal Neck: supple. RIJ HD cath  Carotids 2+ bilat; no bruits. No lymphadenopathy or thryomegaly appreciated. Cor: PMI nondisplaced. Irreg tachyNo rubs, gallops or murmurs. Lungs: clear Abdomen: obese soft, nontender, nondistended. No hepatosplenomegaly. No bruits or masses. Good bowel sounds. Extremities: no cyanosis, clubbing, rash, 3+ edema +UNNA  Neuro: alert & orientedx3, cranial nerves grossly intact. moves all 4 extremities w/o difficulty. Affect pleasant  Remains tenuous. Now on CVVHD with good volume removal but remains markedly volume overloaded and requiring increasing doses of NE to support.   Will add midodrine and increase NE guardrails to support further fluid removal. Continue IV amio and heparin. Plan DC-CV tomorrow.   I remain very concerned that she will not tolerate transition to iHD when time comes.   CRITICAL CARE Performed by: Glori Bickers  Total critical care time: 45 minutes  Critical care time was exclusive of separately billable procedures and treating other patients.  Critical care was necessary to treat or prevent imminent or life-threatening deterioration.  Critical care was time spent personally by me (independent of midlevel providers or residents) on the following activities: development of treatment plan with patient and/or surrogate as well as nursing, discussions with consultants, evaluation of patient's response to treatment, examination of patient, obtaining history from patient or surrogate, ordering and performing treatments and interventions, ordering and review of laboratory studies,  ordering and review of radiographic studies, pulse oximetry and re-evaluation of patient's condition.  Glori Bickers,  MD  12:20 PM

## 2020-11-29 NOTE — Progress Notes (Signed)
This chaplain is present with the Pt., notary and two witnesses for the notarizing of the Pt. Advance Directive:  HCPOA and Living Will.  The chaplain updated the Pt. RN-Maria.  The Pt. named Richelle Ito as her healthcare agent.   The chaplain gave the Pt. the original AD along with two copies. One copy of the AD was placed in the Pt. chart.  F/U spiritual care is available as needed.

## 2020-11-30 ENCOUNTER — Encounter (HOSPITAL_COMMUNITY)
Admission: EM | Disposition: A | Discharge status: Hospice | Payer: Self-pay | Source: Home / Self Care | Attending: Internal Medicine

## 2020-11-30 DIAGNOSIS — I4891 Unspecified atrial fibrillation: Secondary | ICD-10-CM | POA: Diagnosis not present

## 2020-11-30 DIAGNOSIS — R570 Cardiogenic shock: Secondary | ICD-10-CM | POA: Diagnosis not present

## 2020-11-30 LAB — APTT
aPTT: 121 seconds — ABNORMAL HIGH (ref 24–36)
aPTT: 95 seconds — ABNORMAL HIGH (ref 24–36)

## 2020-11-30 LAB — COOXEMETRY PANEL
Carboxyhemoglobin: 1.4 % (ref 0.5–1.5)
Carboxyhemoglobin: 1.6 % — ABNORMAL HIGH (ref 0.5–1.5)
Methemoglobin: 0.6 % (ref 0.0–1.5)
Methemoglobin: 0.6 % (ref 0.0–1.5)
O2 Saturation: 48.5 %
O2 Saturation: 63.2 %
Total hemoglobin: 11 g/dL — ABNORMAL LOW (ref 12.0–16.0)
Total hemoglobin: 12.3 g/dL (ref 12.0–16.0)

## 2020-11-30 LAB — CBC
HCT: 38.7 % (ref 36.0–46.0)
Hemoglobin: 12.6 g/dL (ref 12.0–15.0)
MCH: 33.1 pg (ref 26.0–34.0)
MCHC: 32.6 g/dL (ref 30.0–36.0)
MCV: 101.6 fL — ABNORMAL HIGH (ref 80.0–100.0)
Platelets: 250 10*3/uL (ref 150–400)
RBC: 3.81 MIL/uL — ABNORMAL LOW (ref 3.87–5.11)
RDW: 14.2 % (ref 11.5–15.5)
WBC: 9.6 10*3/uL (ref 4.0–10.5)
nRBC: 0.2 % (ref 0.0–0.2)

## 2020-11-30 LAB — RENAL FUNCTION PANEL
Albumin: 3 g/dL — ABNORMAL LOW (ref 3.5–5.0)
Albumin: 2.9 g/dL — ABNORMAL LOW (ref 3.5–5.0)
Anion gap: 10 (ref 5–15)
Anion gap: 8 (ref 5–15)
BUN: 22 mg/dL (ref 8–23)
BUN: 17 mg/dL (ref 8–23)
CO2: 25 mmol/L (ref 22–32)
CO2: 26 mmol/L (ref 22–32)
Calcium: 8.9 mg/dL (ref 8.9–10.3)
Calcium: 8.8 mg/dL — ABNORMAL LOW (ref 8.9–10.3)
Chloride: 99 mmol/L (ref 98–111)
Chloride: 100 mmol/L (ref 98–111)
Creatinine, Ser: 1.6 mg/dL — ABNORMAL HIGH (ref 0.44–1.00)
Creatinine, Ser: 1.42 mg/dL — ABNORMAL HIGH (ref 0.44–1.00)
GFR, Estimated: 36 mL/min — ABNORMAL LOW (ref >60)
GFR, Estimated: 41 mL/min — ABNORMAL LOW (ref >60)
Glucose, Bld: 119 mg/dL — ABNORMAL HIGH (ref 70–99)
Glucose, Bld: 133 mg/dL — ABNORMAL HIGH (ref 70–99)
Phosphorus: 2.3 mg/dL — ABNORMAL LOW (ref 2.5–4.6)
Phosphorus: 2.7 mg/dL (ref 2.5–4.6)
Potassium: 4.5 mmol/L (ref 3.5–5.1)
Potassium: 4.4 mmol/L (ref 3.5–5.1)
Sodium: 134 mmol/L — ABNORMAL LOW (ref 135–145)
Sodium: 134 mmol/L — ABNORMAL LOW (ref 135–145)

## 2020-11-30 LAB — GLUCOSE, CAPILLARY
Glucose-Capillary: 121 mg/dL — ABNORMAL HIGH (ref 70–99)
Glucose-Capillary: 114 mg/dL — ABNORMAL HIGH (ref 70–99)
Glucose-Capillary: 144 mg/dL — ABNORMAL HIGH (ref 70–99)
Glucose-Capillary: 124 mg/dL — ABNORMAL HIGH (ref 70–99)
Glucose-Capillary: 121 mg/dL — ABNORMAL HIGH (ref 70–99)

## 2020-11-30 LAB — MAGNESIUM: Magnesium: 2.3 mg/dL (ref 1.7–2.4)

## 2020-11-30 LAB — HEPARIN LEVEL (UNFRACTIONATED)
Heparin Unfractionated: 0.75 IU/mL — ABNORMAL HIGH (ref 0.30–0.70)
Heparin Unfractionated: 0.69 IU/mL (ref 0.30–0.70)

## 2020-11-30 SURGERY — CARDIOVERSION
Anesthesia: General

## 2020-11-30 MED ORDER — SODIUM PHOSPHATES 45 MMOLE/15ML IV SOLN
20.0000 mmol | Freq: Once | INTRAVENOUS | Status: AC
  Administered 2020-11-30: 20 mmol via INTRAVENOUS
  Filled 2020-11-30: qty 6.67

## 2020-11-30 MED ORDER — MILRINONE LACTATE IN DEXTROSE 20-5 MG/100ML-% IV SOLN
0.2500 ug/kg/min | INTRAVENOUS | Status: DC
  Administered 2020-11-30 – 2020-12-06 (×21): 0.375 ug/kg/min via INTRAVENOUS
  Administered 2020-12-06 – 2020-12-08 (×5): 0.25 ug/kg/min via INTRAVENOUS
  Filled 2020-11-30 (×25): qty 100

## 2020-11-30 NOTE — Progress Notes (Signed)
Occupational Therapy Treatment Patient Details Name: Caitlyn Fuentes MRN: [id number] DOB: 1955-08-07 Today's Date: 11/30/2020    History of present illness Pt is a 65 y/o female presenting on 11/20/20 with worsening palpitations, SOB and LE edema. Pt found in rapid a fib, chest x-ray with significant vascular congestion. 8/1 hypotensive with cardiogenic shock, transferred to ICU and urgent cardioversion.   S/P TEE and cardioversion 8/4. CRRT started 8/8. PMH includes: afib, cardiomyopathy, HTN, lupus, PAF, CKD.   OT comments  Patient supine in bed and eager to get OOB.  Min assist for bed mobility and tranfers with +2 safety due to CRRT and lines.  Pt visibly fatigued from transfer, reporting she "feels like she just walked a mile". Setup for grooming in recliner, max assist for LB ADLs.  Continue to recommend CIR at this time.    VSS during session on RA- HR 113-125, BP 110/65 and SpO2 96%   Follow Up Recommendations  CIR    Equipment Recommendations  Other (comment) (TBD)    Recommendations for Other Services Rehab consult    Precautions / Restrictions Precautions Precautions: Fall Precaution Comments: watch HR, CRRT Restrictions Weight Bearing Restrictions: No       Mobility Bed Mobility Overal bed mobility: Needs Assistance Bed Mobility: Supine to Sit     Supine to sit: Min assist;+2 for safety/equipment     General bed mobility comments: min assist for trunk support and increased time, +2 safety for lines    Transfers Overall transfer level: Needs assistance   Transfers: Sit to/from Stand;Stand Pivot Transfers Sit to Stand: Min assist;+2 safety/equipment Stand pivot transfers: Min assist;+2 safety/equipment       General transfer comment: min assist to fully power up and steady during transfer to recliner, RN managing CRRT lines    Balance Overall balance assessment: Needs assistance Sitting-balance support: No upper extremity supported;Feet  supported Sitting balance-Leahy Scale: Fair     Standing balance support: Single extremity supported;During functional activity Standing balance-Leahy Scale: Poor Standing balance comment: relies on UE and external support                           ADL either performed or assessed with clinical judgement   ADL Overall ADL's : Needs assistance/impaired     Grooming: Set up;Sitting Grooming Details (indicate cue type and reason): seated in recliner             Lower Body Dressing: Maximal assistance;Sit to/from stand;+2 for safety/equipment Lower Body Dressing Details (indicate cue type and reason): assist for socks, min assist +2 safety sit to stand Toilet Transfer: Minimal assistance;Stand-pivot Toilet Transfer Details (indicate cue type and reason): simulated in room to recliner         Functional mobility during ADLs: Minimal assistance;+2 for safety/equipment;Cueing for safety General ADL Comments: pt limited by decreased activity tolerance and weakness, +2 safety due to CRRT     Vision       Perception     Praxis      Cognition Arousal/Alertness: Awake/alert Behavior During Therapy: WFL for tasks assessed/performed Overall Cognitive Status: Within Functional Limits for tasks assessed                                          Exercises     Shoulder Instructions       General Comments on CRRT,  VSS with HR up to 125 with OOB activity    Pertinent Vitals/ Pain       Pain Assessment: No/denies pain  Home Living                                          Prior Functioning/Environment              Frequency  Min 2X/week        Progress Toward Goals  OT Goals(current goals can now be found in the care plan section)  Progress towards OT goals: Progressing toward goals  Acute Rehab OT Goals Patient Stated Goal: to get better OT Goal Formulation: With patient  Plan Discharge plan remains  appropriate;Frequency remains appropriate    Co-evaluation                 AM-PAC OT "6 Clicks" Daily Activity     Outcome Measure   Help from another person eating meals?: None Help from another person taking care of personal grooming?: A Little Help from another person toileting, which includes using toliet, bedpan, or urinal?: A Lot Help from another person bathing (including washing, rinsing, drying)?: A Lot Help from another person to put on and taking off regular upper body clothing?: A Little Help from another person to put on and taking off regular lower body clothing?: A Lot 6 Click Score: 16    End of Session    OT Visit Diagnosis: Other abnormalities of gait and mobility (R26.89);Muscle weakness (generalized) (M62.81)   Activity Tolerance Patient tolerated treatment well   Patient Left in chair;with call bell/phone within reach;with nursing/sitter in room   Nurse Communication Mobility status        Time: 1030-1053 OT Time Calculation (min): 23 min  Charges: OT General Charges $OT Visit: 1 Visit OT Treatments $Self Care/Home Management : 23-37 mins  Jolaine Artist, OT Acute Rehabilitation Services Pager [phone] Office [place] 11/30/2020, 12:10 PM

## 2020-11-30 NOTE — Progress Notes (Signed)
[place] for Heparin Indication: atrial fibrillation  Patient Measurements: Height: 5' 3.5" (161.3 cm) Weight: 123.3 kg (271 lb 13.2 oz) IBW/kg (Calculated) : 53.55 Heparin Dosing Weight: 85 kg  Vital Signs: Temp: 97.9 F (36.6 C) (08/10 1200) Temp Source: Oral (08/10 1200) BP: 109/77 (08/10 1530) Pulse Rate: 104 (08/10 1530)  Labs: Recent Labs    11/28/20 0230 11/28/20 1603 11/29/20 0414 11/29/20 1600 11/30/20 0345 11/30/20 1400  HGB 12.5  --  12.9  --  12.6  --   HCT 37.0  --  39.5  --  38.7  --   PLT 225  --  242  --  250  --   APTT 79*  --  69*  --  121* 95*  HEPARINUNFRC 0.59  --  0.63  --  0.75* 0.69  CREATININE 4.10*   < > 2.36* 1.90* 1.60*  --    < > = values in this interval not displayed.     Estimated Creatinine Clearance: 45.7 mL/min (A) (by C-G formula based on SCr of 1.6 mg/dL (H)).   Medical History: Past Medical History:  Diagnosis Date   A-fib ([place])    Cardiomyopathy    Hyperlipidemia    Hypertension    Lupus ([place])    Pulmonary hypertension ([place]) 05/10/2011   Echo, EF-40-45   Renal disorder    Sleep apnea 2008   CPAP, pt does not know settings   Thyroid disease     Medications:     Assessment: 65 y.o. female with  h/o Afib, DOAC on hold, for heparin.  Pt was on Xarelto PTA and changed to Eliquis due to ARF.  Eliquis 5 mg last given at 2317 7/31.   aPTT therapeutic at 95 seconds on heparin drip 1400 uts/hr  CBC stable, heparin level also at goal 0.69 Pt s/p DCCV to NSR 8/4 back in Afib 8/5, continues on amiodarone at 60mg /hr plan for DCCV 8/10 but back in SR on own  Goal of Therapy:  Heparin level 0.3-0.7 units/ml aPTT 66-102 seconds Monitor platelets by anticoagulation protocol: Yes   Plan:  Continue heparin to 1400 units/h -Recheck CBC  aPTT and heparin level with daily labs   [facility] Pharm.D. CPP, BCPS Clinical Pharmacist [phone] 11/30/2020 3:42 PM

## 2020-11-30 NOTE — Progress Notes (Signed)
Patient ID: Caitlyn Fuentes, female   DOB: [birth year], 65 y.o.   MRN: [id number] [facility] Progress Note    Assessment/ Plan:    1. Acute kidney Injury on chronic kidney disease stage IV: Nonoliguric and ongoing furosemide augmented earlier with the addition of metolazone.  CRRT started on 8/8 after nontunneled catheter with CHF team for volume management and clearance - Continue CRRT with UF goal back up to net negative 100 ml/hr as tolerated  - 4K fluids - stop daily potassium supplement  2.  Acute exacerbation of congestive heart failure: On milrinone for inotropic support along with norepinephrine  Optimize volume as above   3.  Hypokalemia: Secondary to urinary losses; improved and stopping supplement as above  4.  Atrial fibrillation with rapid ventricular response: RVR with decreased cardiac output likely contributing to renal hypoperfusion/dysfunction. Per CHF  5.  Hyponatremia: Secondary to acute exacerbation of congestive heart failure/worsening renal function and impaired free water handling.  Improved with CRRT   Subjective:   patient had 100 mL uop over 8/9 charted.  She had 2.4 kg UF over 8/9 with CRRT.  Yesterday at 5pm asked that her goal UF be reduced to keep even to net negative 50 an hour as tolerated as pressor requirement had increased.  Per nursing she converted to sinus as 6:30 am.  Levo was 20 mcg/min at start of shift and is down to 15 mcg/min.  She has been on 2 liters oxygen as dips to 80's with sleep per nursing.  Nursing is going to get heel protectors. Per nursing last CVP 22  Review of systems   She reports shortness of breath is better today  Nausea yesterday and some today - better with PRN Denies chest pain   Background on consult:  65 year old African-American woman with past medical history significant for chronic congestive heart failure with reduced ejection fraction (EF 45%), nonischemic cardiomyopathy, hypothyroidism,  hypertension, dyslipidemia, obstructive sleep apnea, obesity, systemic lupus erythematosus and chronic kidney disease stage IV at baseline (baseline creatinine apparently around 2.0) who presented to the hospital with worsening shortness of breath, lower extremity edema, palpitations and dyspnea on exertion.  She was found to have volume overload with pulmonary edema along with atrial patient with RVR and was admitted to the ICU for cardiogenic shock.  Nephrology was originally consulted for management of her oliguric acute kidney injury secondary to cardiogenic shock/cardiorenal syndrome and appears to have been sluggishly improving at the time of sign off (creatinine down to 3.5 from 4.2) with decent diuresis.  Unfortunately, she remained on Lasix drip with suboptimal urine output and worsening renal function (BUN 96/creatinine 3.9) likely in part from recurrence of atrial fibrillation with RVR following DCCV.  She remains on milrinone and Levophed drip with elevated CVP of 22 cm of water.     Objective:   BP (!) 114/98   Pulse (!) 112   Temp 97.9 F (36.6 C) (Oral)   Resp (!) 21   Ht 5' 3.5" (1.613 m)   Wt 123.3 kg   SpO2 100%   BMI 47.40 kg/m   Intake/Output Summary (Last 24 hours) at 11/30/2020 7673 Last data filed at 11/30/2020 0700 Gross per 24 hour  Intake 2152.98 ml  Output 2535 ml  Net -382.02 ml   Weight change: -1.9 kg  Physical Exam:   General adult female in bed in no acute distress seated in chair HEENT normocephalic atraumatic extraocular movements intact sclera anicteric Neck supple trachea midline Lungs clear  to auscultation bilaterally normal work of breathing at rest; on 2liters Heart S1S2 no rub Abdomen soft nontender nondistended obese habitus Extremities 1+ edema with legs wrapped Psych normal mood and affect Neuro - alert and oriented x 3 provides hx and follows commands Access RIJ nontunneled dialysis catheter  Imaging: DG CHEST PORT 1 VIEW  Result Date:  11/28/2020 CLINICAL DATA:  Central line placement EXAM: PORTABLE CHEST 1 VIEW COMPARISON:  11/21/2020 FINDINGS: Interval placement of right IJ catheter with tip at the distal SVC. No pneumothorax identified. Unchanged appearance of left IJ catheter with tip also in the distal SVC. Cardiac enlargement and pulmonary vascular congestion is again noted. Opacification within the left lower lung is unchanged and may represent pneumonia. IMPRESSION: Interval placement of right IJ catheter with tip at the distal SVC. No pneumothorax. Electronically Signed   By: Kerby Moors M.D.   On: 11/28/2020 14:21    Labs: BMET Recent Labs  Lab 11/23/20 1310 11/24/20 0502 11/24/20 1411 11/25/20 0011 11/26/20 0413 11/27/20 0313 11/28/20 0230 11/28/20 1603 11/29/20 0414 11/29/20 1600 11/30/20 0345  NA 131*   < > 131*   < > 131* 132* 129* 130* 133* 133* 134*  K 4.4   < > 3.9   < > 3.1* 3.1* 3.7 3.8 3.8 4.4 4.5  CL 88*   < > 89*   < > 88* 88* 86* 89* 95* 98 99  CO2 30   < > 29   < > 29 29 28 28 26 25 25   GLUCOSE 158*   < > 169*   < > 122* 123* 133* 167* 138* 149* 119*  BUN 88*   < > 93*   < > 97* 96* 98* 73* 50* 33* 22  CREATININE 3.81*   < > 4.00*   < > 3.81* 3.92* 4.10* 3.22* 2.36* 1.90* 1.60*  CALCIUM 9.4   < > 9.4   < > 9.5 9.6 9.5 9.1 9.1 9.0 8.9  PHOS 2.8  --  3.3  --   --   --   --  3.6 3.0 2.2* 2.3*   < > = values in this interval not displayed.   CBC Recent Labs  Lab 11/27/20 0313 11/28/20 0230 11/29/20 0414 11/30/20 0345  WBC 9.6 8.7 7.6 9.6  HGB 12.6 12.5 12.9 12.6  HCT 37.8 37.0 39.5 38.7  MCV 96.9 96.9 98.0 101.6*  PLT 221 225 242 250    Medications:     (feeding supplement) PROSource Plus  30 mL Oral BID BM   B-complex with vitamin C  1 tablet Oral Daily   calcitRIOL  0.25 mcg Oral Daily   Chlorhexidine Gluconate Cloth  6 each Topical Daily   clotrimazole  1 application Topical BID   estradiol  1 mg Oral Daily   febuxostat  40 mg Oral q AM   ferrous sulfate  325 mg Oral q  morning   fluticasone  2 spray Each Nare Daily   fluticasone furoate-vilanterol  1 puff Inhalation Daily   gabapentin  100 mg Oral TID   insulin aspart  0-6 Units Subcutaneous TID WC   levothyroxine  88 mcg Oral q morning   lidocaine  1 patch Transdermal Q24H   midodrine  15 mg Oral TID WC   potassium chloride  40 mEq Oral Daily   predniSONE  5 mg Oral Q breakfast   sodium chloride flush  10-40 mL Intracatheter Q12H   sodium chloride flush  3 mL Intravenous Q12H  [name], MD 11/30/2020,  7:04 AM

## 2020-11-30 NOTE — Progress Notes (Signed)
RN called Cards Fellow - Alfred Levins, MD, in regard to patients drop in coox 54>48. RN given verbal orders to increase pt Milrinone from .52mcg/kg/min to .367mcg/kg/min.   Pt states this morning she "feel more weak and tired". RN was not able to stand patient for morning wait due to this and therefore used a bed weight to acquire daily weight (123.3kg).

## 2020-11-30 NOTE — Progress Notes (Signed)
Advanced Heart Failure Rounding Note  PCP-Cardiologist: Shelva Majestic, MD  HF clinic: Dr. Haroldine Laws  Subjective:   - 8/1 A fib RVR & A/C systolic heart faiure --> cardiogenic shock. Had urgent DC-CV with brief conversion to NSR but went back in A fib. . On Norepi + milrinone. AKI. Nephrology consulted.  - 8/2 A fib RVR, amio gtt. Continued on milrinone 0.375 mcg + norepi 2 mcg. Started on prednisone 40 mg daily x3 days for gout flare.  - 8/3 did not tolerate milrinone wean w/ drop in Co-ox 68%--->48% and decrease in UOP. Milrinone increased back to 0.375.  Lasix gtt increased to 30/hr.  - 8/4 NE added back given persistently low Co-ox - 8/4 s/p TEE/DCCV>>NSR  - 8/5 back in Afib w/ RVR  -  08/09 HD cath placed, CRRT started - 8/10: conversion to sinus tachycardia   Co-ox dropped from 54 to 48 overnight, so milrinone increased to 0.375 per cardiology fellow.  Co-ox now improved to 63.2%  Remains on milrinone 0.375 with NE up to 15 (ranged from 15-20 over the past several hours). Co-ox 63% > 56.2% > 54%>48>63.2.  SCr 4.1 -> 3.8 -> 3.9 -> 4.10 > 2.36>1.9>1.6. CVP 15-16.  Converted to ST early this AM -- confirmed by EKG   Net I/O is even with 2.4L of volume removed via CVVHD. Weight down 5 pounds since yesterday.    Objective:   Weight Range: 123.3 kg Body mass index is 47.4 kg/m.   Vital Signs:   Temp:  [96.8 F (36 C)-97.9 F (36.6 C)] 96.8 F (36 C) (08/10 0800) Pulse Rate:  [64-149] 111 (08/10 0745) Resp:  [14-29] 24 (08/10 0745) BP: (57-135)/(22-119) 109/97 (08/10 0745) SpO2:  [88 %-100 %] 100 % (08/10 0745) Weight:  [123.3 kg] 123.3 kg (08/10 0500) Last BM Date: 11/29/20  Weight change: Filed Weights   11/26/20 0500 11/29/20 0500 11/30/20 0500  Weight: 126.1 kg 125.2 kg 123.3 kg    Intake/Output:   Intake/Output Summary (Last 24 hours) at 11/30/2020 0813 Last data filed at 11/30/2020 0800 Gross per 24 hour  Intake 2144.52 ml  Output 2622 ml  Net -477.48 ml       Physical Exam  CVP 15-16 General:  Lying in bed in no acute distress  HEENT: normal Neck: supple. +JVD.  RIJ HD catheter, LIJ CVC. Carotids 2+ bilat; no bruits. No lymphadenopathy or thryomegaly appreciated. Cor: PMI nondisplaced. Regular, tachycardic. No rubs, gallops or murmurs. Lungs: clear Abdomen: obese soft, nontender, nondistended. No hepatosplenomegaly. No bruits or masses. Good bowel sounds. Extremities: no cyanosis, clubbing, rash, 3+ edema Neuro: alert & orientedx3, cranial nerves grossly intact. moves all 4 extremities w/o difficulty. Affect pleasant   Telemetry   ST in 110s, personally reviewed.    Labs    CBC Recent Labs    11/29/20 0414 11/30/20 0345  WBC 7.6 9.6  HGB 12.9 12.6  HCT 39.5 38.7  MCV 98.0 101.6*  PLT 242 500    Basic Metabolic Panel Recent Labs    11/29/20 0414 11/29/20 1600 11/30/20 0345  NA 133* 133* 134*  K 3.8 4.4 4.5  CL 95* 98 99  CO2 26 25 25   GLUCOSE 138* 149* 119*  BUN 50* 33* 22  CREATININE 2.36* 1.90* 1.60*  CALCIUM 9.1 9.0 8.9  MG 2.4  --  2.3  PHOS 3.0 2.2* 2.3*    Liver Function Tests Recent Labs    11/29/20 1600 11/30/20 0345  ALBUMIN 3.1* 3.0*  No results for input(s): LIPASE, AMYLASE in the last 72 hours. Cardiac Enzymes No results for input(s): CKTOTAL, CKMB, CKMBINDEX, TROPONINI in the last 72 hours.  BNP: BNP (last 3 results) Recent Labs    11/20/20 0318 11/21/20 0258  BNP 2,051.3* 2,022.4*     ProBNP (last 3 results) No results for input(s): PROBNP in the last 8760 hours.   D-Dimer No results for input(s): DDIMER in the last 72 hours. Hemoglobin A1C No results for input(s): HGBA1C in the last 72 hours.  Fasting Lipid Panel No results for input(s): CHOL, HDL, LDLCALC, TRIG, CHOLHDL, LDLDIRECT in the last 72 hours. Thyroid Function Tests No results for input(s): TSH, T4TOTAL, T3FREE, THYROIDAB in the last 72 hours.  Invalid input(s): FREET3   Other results:   Imaging     No results found.   Medications:     Scheduled Medications:  (feeding supplement) PROSource Plus  30 mL Oral BID BM   B-complex with vitamin C  1 tablet Oral Daily   calcitRIOL  0.25 mcg Oral Daily   Chlorhexidine Gluconate Cloth  6 each Topical Daily   clotrimazole  1 application Topical BID   estradiol  1 mg Oral Daily   febuxostat  40 mg Oral q AM   ferrous sulfate  325 mg Oral q morning   fluticasone  2 spray Each Nare Daily   fluticasone furoate-vilanterol  1 puff Inhalation Daily   gabapentin  100 mg Oral TID   insulin aspart  0-6 Units Subcutaneous TID WC   levothyroxine  88 mcg Oral q morning   lidocaine  1 patch Transdermal Q24H   midodrine  15 mg Oral TID WC   predniSONE  5 mg Oral Q breakfast   sodium chloride flush  10-40 mL Intracatheter Q12H   sodium chloride flush  3 mL Intravenous Q12H    Infusions:   prismasol BGK 4/2.5 400 mL/hr at 11/29/20 2107    prismasol BGK 4/2.5 300 mL/hr at 11/29/20 1641   sodium chloride     amiodarone 60 mg/hr (11/30/20 0800)   heparin 1,400 Units/hr (11/30/20 0800)   milrinone 0.375 mcg/kg/min (11/30/20 0800)   norepinephrine (LEVOPHED) Adult infusion 15 mcg/min (11/30/20 0800)   prismasol BGK 4/2.5 2,000 mL/hr at 11/30/20 5462   sodium phosphate  Dextrose 5% IVPB      PRN Medications: sodium chloride, albuterol, cyclobenzaprine, heparin, HYDROcodone-acetaminophen, hydrocortisone cream, ondansetron (ZOFRAN) IV, oxyCODONE, sodium chloride flush, sodium chloride flush    Patient Profile   Caitlyn Fuentes is a 65 year old with a history of chronic systolic hf previously EF 45%,  NICM, hypothyroid, PAF, gout, OSA, CKD Stage IV, HTN, hyperlipidemia, and lupus.   Admitted with A fib RVR and A/C systolic heart failure complicated by cardiogenic shock.     Assessment/Plan    1. Acute/Chronic Systolic Heart Failure Cardiogenic shock - 8/1 Lactic acid 1.5. CO-OX 43%.--> milrinone increased 0.375 mcg + norepe 4 mcg. CO-OX  repeated with improvement.  - Echo 7/22 EF 30-35% in 2021 -> 25-30%.  - Back on dual inotropes, Milrinone 0.375 + NE 15. Co-ox 63.2 - Failed DCCV x2, but converted to sinus tachycardia on 8/10 - Volume optimization via CVVHD -- now aiming for -100/hr per nephrology   - Unable to tolerate GDMT at this time.   2. PAF--> Afib RVR - Developed shock and had urgent cardioversion 8/1 with conversion to NSR w/ ERAF - Repeat DCCV 8/4 w/ ERAF - Conversion to sinus tachycardia on 8/10  -  c/w amio gtt at 60 hr for now -- can consider decreasing if she maintains regular rhythm  - unfortunately not a candidate for ablation due to size if AF were to recur. Not candidate for AVN ablation and CRT. - Continue heparin gtt   3. AKI on CKD Stage IV - Baseline appears to be around 3.2  - SCr  4.1 -> 3.8 -> 3.9 -> 4.10 > 3.22 > 2.36 > 1.9 > 1.6 - BUN improving -- was 98, now 22  - Renal US with no acute findings - CRRT with UF goal net neg 100 ml/hr per nephrology, appreciate input - May be poor candidate for iHD with advanced HF unless EF recovers and maintains normal rhythm    4. OSA Uses CPAP   5. Hypothyroidism -TSH 8. T3 ok and T4 2.3  -On synthroid. Dose increased to 88 mcg -Repeat TSH in 6 weeks.    6. Gout - Uric  Acid 10. Pain RLE ? Flare  - On uloric 40 mg daily  - Completed 3 day course of prednisone    7. Hypokalemia -- now resolved - 4.5 today, 4.4 yesterday - Daily potassium supplementation stopped  - Monitor closely  8. Deconditioning - Continue PT/OT.  - needs HHPT. ? SNF  9. Hypervolemic hyponatremia -- improving - Na 129>>132 > 131 > 132 > 129  > 130 > 133 > 134 - limit free H2O  10. Lupus - on low-dose prednisone.  Length of Stay: [place], NP  11/30/2020, 8:13 AM  Advanced Heart Failure Team Pager [phone] (M-F; 7a - 5p)  Please contact [facility] for night-coverage after hours (5p -7a ) and weekends on [email or web address]  Agree with above.   She  remains on CVVHD. Pulling fluid well. Remains on NE and milrinone. Milrinone increased last night due to low co-ox. Has converted to NSR overnight on IV amio. Remains on heparin.   Feels weak. Denies SOB, orthopnea or PND.   General:  Lying in bed No resp difficulty HEENT: normal  Neck: supple. nRIJ HD cath Carotids 2+ bilat; no bruits. No lymphadenopathy or thryomegaly appreciated. Cor: PMI nondisplaced. Regular rate & rhythm. No rubs, gallops or murmurs. Lungs: clear Abdomen: obese soft, nontender, nondistended. No hepatosplenomegaly. No bruits or masses. Good bowel sounds. Extremities: no cyanosis, clubbing, rash, 3+ edema + UNNA Neuro: alert & orientedx3, cranial nerves grossly intact. moves all 4 extremities w/o difficulty. Affect pleasant  Continues on CVVHD. Volume status improving but still markedly volume overloaded. Continues to require dual pressors for BP/output support.  Now back in NSR on IV amio.   Will continue current plan. Remove Foley. Ambulate as tolerated.   I remain very concerned that she will not tolerate iHD well if kidneys don't recover.   CRITICAL CARE Performed by: Glori Bickers  Total critical care time: 40 minutes  Critical care time was exclusive of separately billable procedures and treating other patients.  Critical care was necessary to treat or prevent imminent or life-threatening deterioration.  Critical care was time spent personally by me (independent of midlevel providers or residents) on the following activities: development of treatment plan with patient and/or surrogate as well as nursing, discussions with consultants, evaluation of patient's response to treatment, examination of patient, obtaining history from patient or surrogate, ordering and performing treatments and interventions, ordering and review of laboratory studies, ordering and review of radiographic studies, pulse oximetry and re-evaluation of patient's condition.  Glori Bickers, MD  6:29 PM

## 2020-11-30 NOTE — Progress Notes (Signed)
ANTICOAGULATION CONSULT NOTE - Follow Up Consult  Pharmacy Consult for heparin Indication: atrial fibrillation  Labs: Recent Labs    11/28/20 0230 11/28/20 1603 11/29/20 0414 11/29/20 1600 11/30/20 0345  HGB 12.5  --  12.9  --  12.6  HCT 37.0  --  39.5  --  38.7  PLT 225  --  242  --  250  APTT 79*  --  69*  --  121*  HEPARINUNFRC 0.59  --  0.63  --  0.75*  CREATININE 4.10*   < > 2.36* 1.90* 1.60*   < > = values in this interval not displayed.    Assessment: 65yo female now supratherapeutic on heparin after several PTTs at goal; no infusion issues or signs of bleeding per RN.  Goal of Therapy:  Heparin level 0.3-0.7 units/ml aPTT 66-102 seconds   Plan:  Will decrease heparin infusion by 1 units/kg/hr to 1400 units/hr and check labs in 8 hours.    Wynona Neat, PharmD, BCPS  11/30/2020,6:45 AM

## 2020-12-01 DIAGNOSIS — I4891 Unspecified atrial fibrillation: Secondary | ICD-10-CM | POA: Diagnosis not present

## 2020-12-01 DIAGNOSIS — N186 End stage renal disease: Secondary | ICD-10-CM

## 2020-12-01 DIAGNOSIS — R570 Cardiogenic shock: Secondary | ICD-10-CM | POA: Diagnosis not present

## 2020-12-01 LAB — RENAL FUNCTION PANEL
Albumin: 2.8 g/dL — ABNORMAL LOW (ref 3.5–5.0)
Albumin: 3.1 g/dL — ABNORMAL LOW (ref 3.5–5.0)
Anion gap: 11 (ref 5–15)
Anion gap: 9 (ref 5–15)
BUN: 16 mg/dL (ref 8–23)
BUN: 17 mg/dL (ref 8–23)
CO2: 25 mmol/L (ref 22–32)
CO2: 25 mmol/L (ref 22–32)
Calcium: 9.5 mg/dL (ref 8.9–10.3)
Calcium: 9.3 mg/dL (ref 8.9–10.3)
Chloride: 97 mmol/L — ABNORMAL LOW (ref 98–111)
Chloride: 99 mmol/L (ref 98–111)
Creatinine, Ser: 1.88 mg/dL — ABNORMAL HIGH (ref 0.44–1.00)
Creatinine, Ser: 1.83 mg/dL — ABNORMAL HIGH (ref 0.44–1.00)
GFR, Estimated: 29 mL/min — ABNORMAL LOW (ref >60)
GFR, Estimated: 30 mL/min — ABNORMAL LOW (ref >60)
Glucose, Bld: 117 mg/dL — ABNORMAL HIGH (ref 70–99)
Glucose, Bld: 154 mg/dL — ABNORMAL HIGH (ref 70–99)
Phosphorus: 2.7 mg/dL (ref 2.5–4.6)
Phosphorus: 2.2 mg/dL — ABNORMAL LOW (ref 2.5–4.6)
Potassium: 4 mmol/L (ref 3.5–5.1)
Potassium: 4.2 mmol/L (ref 3.5–5.1)
Sodium: 133 mmol/L — ABNORMAL LOW (ref 135–145)
Sodium: 133 mmol/L — ABNORMAL LOW (ref 135–145)

## 2020-12-01 LAB — CBC
HCT: 36.5 % (ref 36.0–46.0)
Hemoglobin: 11.2 g/dL — ABNORMAL LOW (ref 12.0–15.0)
MCH: 31.6 pg (ref 26.0–34.0)
MCHC: 30.7 g/dL (ref 30.0–36.0)
MCV: 103.1 fL — ABNORMAL HIGH (ref 80.0–100.0)
Platelets: 225 10*3/uL (ref 150–400)
RBC: 3.54 MIL/uL — ABNORMAL LOW (ref 3.87–5.11)
RDW: 14.3 % (ref 11.5–15.5)
WBC: 7.9 10*3/uL (ref 4.0–10.5)
nRBC: 0 % (ref 0.0–0.2)

## 2020-12-01 LAB — MAGNESIUM: Magnesium: 2.5 mg/dL — ABNORMAL HIGH (ref 1.7–2.4)

## 2020-12-01 LAB — COOXEMETRY PANEL
Carboxyhemoglobin: 1.4 % (ref 0.5–1.5)
Methemoglobin: 0.7 % (ref 0.0–1.5)
O2 Saturation: 67.6 %
Total hemoglobin: 11.6 g/dL — ABNORMAL LOW (ref 12.0–16.0)

## 2020-12-01 LAB — GLUCOSE, CAPILLARY
Glucose-Capillary: 102 mg/dL — ABNORMAL HIGH (ref 70–99)
Glucose-Capillary: 125 mg/dL — ABNORMAL HIGH (ref 70–99)
Glucose-Capillary: 123 mg/dL — ABNORMAL HIGH (ref 70–99)
Glucose-Capillary: 119 mg/dL — ABNORMAL HIGH (ref 70–99)

## 2020-12-01 LAB — APTT: aPTT: 81 seconds — ABNORMAL HIGH (ref 24–36)

## 2020-12-01 LAB — HEPARIN LEVEL (UNFRACTIONATED): Heparin Unfractionated: 0.51 IU/mL (ref 0.30–0.70)

## 2020-12-01 MED ORDER — SENNOSIDES-DOCUSATE SODIUM 8.6-50 MG PO TABS
2.0000 | ORAL_TABLET | Freq: Two times a day (BID) | ORAL | Status: DC
  Administered 2020-12-01 – 2020-12-18 (×15): 2 via ORAL
  Filled 2020-12-01 (×25): qty 2

## 2020-12-01 NOTE — Progress Notes (Signed)
PT refused CPAP for tonight.

## 2020-12-01 NOTE — Progress Notes (Signed)
Physical Therapy Treatment Patient Details Name: Caitlyn Fuentes MRN: [id number] DOB: [birth year] Today's Date: 12/01/2020    History of Present Illness Pt is a 65 y/o female presenting on 11/20/20 with worsening palpitations, SOB and LE edema. Pt found in rapid a fib, chest x-ray with significant vascular congestion. 8/1 hypotensive with cardiogenic shock, transferred to ICU and urgent cardioversion.   S/P TEE and cardioversion 8/4. CRRT started 8/8. PMH includes: afib, cardiomyopathy, HTN, lupus, PAF, CKD.    PT Comments    Pt tolerated treatment well with VSS throughout. Pt required decreased assist level with transfers compared to previous session. Pt progressed to ambulation in room, limited by CRRT. Will continue to follow acutely.   Follow Up Recommendations  CIR;Supervision/Assistance - 24 hour     Equipment Recommendations  Rolling walker with 5" wheels    Recommendations for Other Services       Precautions / Restrictions Precautions Precautions: Fall Precaution Comments: watch HR, CRRT    Mobility  Bed Mobility               General bed mobility comments: in chair on arrival and end of session    Transfers Overall transfer level: Needs assistance   Transfers: Sit to/from Stand Sit to Stand: Min guard;+2 safety/equipment         General transfer comment: cues for hand placement with guarding for lines  Ambulation/Gait Ambulation/Gait assistance: Min guard Gait Distance (Feet): 40 Feet Assistive device: Rolling walker (2 wheeled) Gait Pattern/deviations: Step-through pattern;Decreased stride length   Gait velocity interpretation: 1.31 - 2.62 ft/sec, indicative of limited community ambulator General Gait Details: pt with limited laps in distance of CRRT line with RW and +2 assist for lines. Limited by fatigue   Stairs             Wheelchair Mobility    Modified Rankin (Stroke Patients Only)       Balance Overall balance assessment:  Mild deficits observed, not formally tested                                          Cognition Arousal/Alertness: Awake/alert Behavior During Therapy: WFL for tasks assessed/performed Overall Cognitive Status: Within Functional Limits for tasks assessed                                        Exercises General Exercises - Lower Extremity Long Arc Quad: AROM;Both;Seated;15 reps Hip ABduction/ADduction: Both;Seated;15 reps;AAROM;AROM (AAROm initially on RLE, AROM on LLE) Straight Leg Raises: AAROM;Both;Seated;15 reps Hip Flexion/Marching: AROM;Both;Seated;15 reps Toe Raises: AROM;Both;Seated;15 reps Heel Raises: AROM;Both;15 reps;Seated    General Comments        Pertinent Vitals/Pain Pain Assessment: No/denies pain    Home Living                      Prior Function            PT Goals (current goals can now be found in the care plan section) Progress towards PT goals: Progressing toward goals    Frequency    Min 3X/week      PT Plan Current plan remains appropriate    Co-evaluation              AM-PAC PT "6 Clicks" Mobility   Outcome Measure  Help needed turning from your back to your side while in a flat bed without using bedrails?: A Little Help needed moving from lying on your back to sitting on the side of a flat bed without using bedrails?: A Little Help needed moving to and from a bed to a chair (including a wheelchair)?: A Little Help needed standing up from a chair using your arms (e.g., wheelchair or bedside chair)?: A Little Help needed to walk in hospital room?: A Little Help needed climbing 3-5 steps with a railing? : A Lot 6 Click Score: 17    End of Session   Activity Tolerance: Patient tolerated treatment well Patient left: in chair;with call bell/phone within reach Nurse Communication: Mobility status PT Visit Diagnosis: Other abnormalities of gait and mobility (R26.89);Muscle weakness  (generalized) (M62.81)     Time: 9747-1855 PT Time Calculation (min) (ACUTE ONLY): 25 min  Charges:  $Gait Training: 8-22 mins $Therapeutic Exercise: 8-22 mins                     Louie Casa, SPT Acute Rehab: [phone]    Domingo Dimes 12/01/2020, 12:54 PM

## 2020-12-01 NOTE — Progress Notes (Signed)
[facility] for Heparin Indication: atrial fibrillation  Patient Measurements: Height: 5' 3.5" (161.3 cm) Weight: 120.5 kg (265 lb 10.5 oz) IBW/kg (Calculated) : 53.55 Heparin Dosing Weight: 85 kg  Vital Signs: Temp: 98.3 F (36.8 C) (08/11 0715) Temp Source: Axillary (08/11 0715) BP: 120/94 (08/11 0715) Pulse Rate: 130 (08/11 0715)  Labs: Recent Labs    11/29/20 0414 11/29/20 1600 11/30/20 0345 11/30/20 1400 11/30/20 1610 12/01/20 0410 12/01/20 0426  HGB 12.9  --  12.6  --   --   --  11.2*  HCT 39.5  --  38.7  --   --   --  36.5  PLT 242  --  250  --   --   --  225  APTT 69*  --  121* 95*  --  81*  --   HEPARINUNFRC 0.63  --  0.75* 0.69  --  0.51  --   CREATININE 2.36*   < > 1.60*  --  1.42* 1.88*  --    < > = values in this interval not displayed.     Estimated Creatinine Clearance: 38.4 mL/min (A) (by C-G formula based on SCr of 1.88 mg/dL (H)).   Medical History: Past Medical History:  Diagnosis Date   A-fib ([place])    Cardiomyopathy    Hyperlipidemia    Hypertension    Lupus ([place])    Pulmonary hypertension ([place]) 05/10/2011   Echo, EF-40-45   Renal disorder    Sleep apnea 2008   CPAP, pt does not know settings   Thyroid disease     Medications:     Assessment: 65 y.o. female with  h/o Afib, DOAC on hold, for heparin.  Pt was on Xarelto PTA and changed to Eliquis due to ARF.  Eliquis 5 mg last given at 2317 7/31.   aPTT therapeutic at 81 seconds, heparin level 0.51 on heparin drip 1400 uts/hr  CBC stable, heparin level also at goal 0.69 Pt s/p DCCV to NSR 8/4 back in Afib 8/5, continues on amiodarone at 60mg /hr plan for DCCV 8/10 but back in SR on own  Goal of Therapy:  Heparin level 0.3-0.7 units/ml aPTT 66-102 seconds Monitor platelets by anticoagulation protocol: Yes   Plan:  Continue heparin to 1400 units/h -Recheck CBC  aPTT and heparin level with daily labs   [employer] Pharm.D.  CPP, BCPS Clinical Pharmacist [phone] 12/01/2020 8:34 AM

## 2020-12-01 NOTE — Progress Notes (Signed)
Advanced Heart Failure Rounding Note  PCP-Cardiologist: Shelva Majestic, MD  HF clinic: Dr. Haroldine Laws  Subjective:   Remains on NE at 2 and milrinone at 0.375.  Co-ox of 0.375. CVP of 9 (personally obtained).  Rhythm still sinus tachycardia with frequent PVCs.    Net I/O is -4L with weight down 6 pounds.    - 8/1 A fib RVR & A/C systolic heart faiure --> cardiogenic shock. Had urgent DC-CV with brief conversion to NSR but went back in A fib. . On Norepi + milrinone. AKI. Nephrology consulted.  - 8/2 A fib RVR, amio gtt. Continued on milrinone 0.375 mcg + norepi 2 mcg. Started on prednisone 40 mg daily x3 days for gout flare.  - 8/3 did not tolerate milrinone wean w/ drop in Co-ox 68%--->48% and decrease in UOP. Milrinone increased back to 0.375.  Lasix gtt increased to 30/hr.  - 8/4 NE added back given persistently low Co-ox - 8/4 s/p TEE/DCCV>>NSR  - 8/5 back in Afib w/ RVR  -  08/09 HD cath placed, CRRT started - 8/10: conversion to sinus tachycardia   She is sitting up in a chair in no acute distress. She feels much better and denies any shortness of breath, chest pain or abdominal pain.    Objective:   Weight Range: 120.5 kg Body mass index is 46.32 kg/m.   Vital Signs:   Temp:  [96.8 F (36 C)-99 F (37.2 C)] 98.3 F (36.8 C) (08/11 0715) Pulse Rate:  [45-131] 130 (08/11 0715) Resp:  [14-34] 17 (08/11 0715) BP: (90-127)/(53-94) 120/94 (08/11 0715) SpO2:  [90 %-100 %] 96 % (08/11 0715) Weight:  [120.5 kg] 120.5 kg (08/11 0500) Last BM Date: 11/29/20  Weight change: Filed Weights   11/29/20 0500 11/30/20 0500 12/01/20 0500  Weight: 125.2 kg 123.3 kg 120.5 kg    Intake/Output:   Intake/Output Summary (Last 24 hours) at 12/01/2020 0759 Last data filed at 12/01/2020 0700 Gross per 24 hour  Intake 2587.21 ml  Output 6506 ml  Net -3918.79 ml      Physical Exam  CVP 9 General:  Lying in bed in no acute distress  HEENT: normal Neck: supple. +JVD.  RIJ HD  catheter, LIJ CVC. Carotids 2+ bilat; no bruits. No lymphadenopathy or thryomegaly appreciated. Cor: PMI nondisplaced. Regular, tachycardic. No rubs, gallops or murmurs. Lungs: clear Abdomen: obese soft, nontender, nondistended. No hepatosplenomegaly. No bruits or masses. Good bowel sounds. Extremities: no cyanosis, clubbing, rash, 2+ edema Neuro: alert & orientedx3, cranial nerves grossly intact. moves all 4 extremities w/o difficulty. Affect pleasant   Telemetry   ST in 110s, personally reviewed.    Labs    CBC Recent Labs    11/30/20 0345 12/01/20 0426  WBC 9.6 7.9  HGB 12.6 11.2*  HCT 38.7 36.5  MCV 101.6* 103.1*  PLT 250 725    Basic Metabolic Panel Recent Labs    11/30/20 0345 11/30/20 1610 12/01/20 0410  NA 134* 134* 133*  K 4.5 4.4 4.0  CL 99 100 97*  CO2 25 26 25   GLUCOSE 119* 133* 117*  BUN 22 17 16   CREATININE 1.60* 1.42* 1.88*  CALCIUM 8.9 8.8* 9.5  MG 2.3  --  2.5*  PHOS 2.3* 2.7 2.7    Liver Function Tests Recent Labs    11/30/20 1610 12/01/20 0410  ALBUMIN 2.9* 2.8*    No results for input(s): LIPASE, AMYLASE in the last 72 hours. Cardiac Enzymes No results for input(s): CKTOTAL, CKMB, CKMBINDEX,  TROPONINI in the last 72 hours.  BNP: BNP (last 3 results) Recent Labs    11/20/20 0318 11/21/20 0258  BNP 2,051.3* 2,022.4*     ProBNP (last 3 results) No results for input(s): PROBNP in the last 8760 hours.   D-Dimer No results for input(s): DDIMER in the last 72 hours. Hemoglobin A1C No results for input(s): HGBA1C in the last 72 hours.  Fasting Lipid Panel No results for input(s): CHOL, HDL, LDLCALC, TRIG, CHOLHDL, LDLDIRECT in the last 72 hours. Thyroid Function Tests No results for input(s): TSH, T4TOTAL, T3FREE, THYROIDAB in the last 72 hours.  Invalid input(s): FREET3   Other results:   Imaging    No results found.   Medications:     Scheduled Medications:  (feeding supplement) PROSource Plus  30 mL Oral  BID BM   B-complex with vitamin C  1 tablet Oral Daily   calcitRIOL  0.25 mcg Oral Daily   Chlorhexidine Gluconate Cloth  6 each Topical Daily   clotrimazole  1 application Topical BID   estradiol  1 mg Oral Daily   febuxostat  40 mg Oral q AM   ferrous sulfate  325 mg Oral q morning   fluticasone  2 spray Each Nare Daily   fluticasone furoate-vilanterol  1 puff Inhalation Daily   gabapentin  100 mg Oral TID   insulin aspart  0-6 Units Subcutaneous TID WC   levothyroxine  88 mcg Oral q morning   lidocaine  1 patch Transdermal Q24H   midodrine  15 mg Oral TID WC   predniSONE  5 mg Oral Q breakfast   sodium chloride flush  10-40 mL Intracatheter Q12H   sodium chloride flush  3 mL Intravenous Q12H    Infusions:   prismasol BGK 4/2.5 400 mL/hr at 11/30/20 2243    prismasol BGK 4/2.5 300 mL/hr at 12/01/20 0305   sodium chloride     amiodarone 60 mg/hr (12/01/20 0700)   heparin 1,400 Units/hr (12/01/20 0700)   milrinone 0.375 mcg/kg/min (12/01/20 0700)   norepinephrine (LEVOPHED) Adult infusion 2 mcg/min (12/01/20 0700)   prismasol BGK 4/2.5 1,500 mL/hr at 12/01/20 0310    PRN Medications: sodium chloride, albuterol, cyclobenzaprine, heparin, HYDROcodone-acetaminophen, hydrocortisone cream, ondansetron (ZOFRAN) IV, oxyCODONE, sodium chloride flush, sodium chloride flush    Patient Profile   Caitlyn Fuentes is a 65 year old with a history of chronic systolic hf previously EF 45%,  NICM, hypothyroid, PAF, gout, OSA, CKD Stage IV, HTN, hyperlipidemia, and lupus.   Admitted with A fib RVR and A/C systolic heart failure complicated by cardiogenic shock.     Assessment/Plan    1. Acute/Chronic Systolic Heart Failure Cardiogenic shock - 8/1 Lactic acid 1.5. CO-OX 43%.--> milrinone increased 0.375 mcg + norepe 4 mcg. CO-OX repeated with improvement.  - Echo 7/22 EF 30-35% in 2021 -> 25-30%.  - Back on dual inotropes, Milrinone 0.375 + NE 2. Co-ox 67.6 - Can consider decreasing  milrinone  - Failed DCCV x2, but converted to sinus tachycardia on 8/10 - Volume optimization via CVVHD  - Unable to tolerate GDMT at this time while on Levophed   2. PAF--> Afib RVR - Developed shock and had urgent cardioversion 8/1 with conversion to NSR w/ ERAF - Repeat DCCV 8/4 w/ ERAF - Conversion to sinus tachycardia on 8/10  - Can decrease amiodarone to 30 today  - unfortunately not a candidate for ablation due to size if AF were to recur. Not candidate for AVN ablation and CRT. -  Continue heparin gtt   3. AKI on CKD Stage IV - Baseline appears to be around 3.2  - SCr  4.1 -> 3.8 -> 3.9 -> 4.10 > 3.22 > 2.36 > 1.9 > 1.6 > 1.4 > 1.88. Creatinine slightly higher today  - BUN improving -- was 98, now 16 - Renal US with no acute findings - Continue CVVHD.  Appreciate nephrology recommendations  - May be poor candidate for iHD with advanced HF unless EF recovers and maintains normal rhythm    4. OSA Uses CPAP   5. Hypothyroidism -TSH 8. T3 ok and T4 2.3  -On synthroid. Dose increased to 88 mcg -Repeat TSH in 6 weeks.    6. Gout - Uric acid 10. Pain RLE ? Flare  - On uloric 40 mg daily  - Completed 3 day course of prednisone    7. Hypokalemia -- now resolved - K of 4 today  - Daily potassium supplementation stopped  - Monitor closely  8. Deconditioning - Continue PT/OT.  - needs HHPT. ? SNF  9. Hypervolemic hyponatremia -- improving - Na 129>>132 > 131 > 132 > 129  > 130 > 133 > 134 > 133 - limit free H2O  10. Lupus - on low-dose prednisone.  Length of Stay: [address] NP  12/01/2020, 7:59 AM  Advanced Heart Failure Team Pager [phone] (M-F; 7a - 5p)  Please contact [facility] for night-coverage after hours (5p -7a ) and weekends on [email or web address]  Agree with above. Remains on CVVHD at -150/hr. On milrinone 0.375 and NE 2. Co-ox 68%. CVP down to 8. Weight 17 pounds. Remains in NSR on amio 60. On heparin. No bleeding.    Feels ok. No CP or SOB at  rest. Still weak.   General:  Sitting up in chair No resp difficulty HEENT: normal Neck: supple. RIJ HD cath  Carotids 2+ bilat; no bruits. No lymphadenopathy or thryomegaly appreciated. Cor: PMI nondisplaced. Regular rate & rhythm. No rubs, gallops or murmurs. Lungs: clear Abdomen: obese. soft, nontender, nondistended. No hepatosplenomegaly. No bruits or masses. Good bowel sounds. Extremities: no cyanosis, clubbing, rash, 1-2+ edema + UNNA Neuro: alert & orientedx3, cranial nerves grossly intact. moves all 4 extremities w/o difficulty. Affect pleasant  Will continue volume removal with CVVHD until euvolemic and then assess for renal recovery. If no renal recovery will try to switch to iHD and wean inotropes as tolerated. Hopefully we will get some myocardial recovery with maintenance of NSR to support inotrope wean.   CRITICAL CARE Performed by: Glori Bickers  Total critical care time: 40 minutes  Critical care time was exclusive of separately billable procedures and treating other patients.  Critical care was necessary to treat or prevent imminent or life-threatening deterioration.  Critical care was time spent personally by me (independent of midlevel providers or residents) on the following activities: development of treatment plan with patient and/or surrogate as well as nursing, discussions with consultants, evaluation of patient's response to treatment, examination of patient, obtaining history from patient or surrogate, ordering and performing treatments and interventions, ordering and review of laboratory studies, ordering and review of radiographic studies, pulse oximetry and re-evaluation of patient's condition.  Glori Bickers, MD  8:42 AM

## 2020-12-01 NOTE — Progress Notes (Signed)
Patient ID: Caitlyn Fuentes, female   DOB: 22-Jan-1956, 65 y.o.   MRN: [id number] [facility] Progress Note    Assessment/ Plan:    1. Acute kidney Injury on chronic kidney disease stage IV: Nonoliguric and ongoing furosemide augmented earlier with the addition of metolazone.  CRRT started on 8/8 after nontunneled catheter with CHF team for volume management and clearance - Continue CRRT with UF goal net negative 100 to 150 ml/hr as tolerated.  Please page nephrology for UF goal changes - 4K fluids  2.  Acute exacerbation of congestive heart failure: On milrinone for inotropic support along with norepinephrine  Optimize volume as above   3.  Hypokalemia: Secondary to urinary losses; improved and doing well on 4K  4.  Atrial fibrillation with rapid ventricular response: RVR with decreased cardiac output likely contributing to renal hypoperfusion/dysfunction.  Per CHF  5.  Hyponatremia: Secondary to acute exacerbation of congestive heart failure/worsening renal function and impaired free water handling.  Improved with CRRT   Subjective:   patient was anuric over 8/10.  She had 6.1 kg UF over 8/10 with CRRT.  Per nursing yesterday her UF goal was increased to 150 ml/hr at some point during day shift.  Levo is at 2 mcg/min.  Per nursing last CVP 9.  Review of systems   She reports shortness of breath is better today; no overt shortness of breath Denies n/v Denies chest pain   Background on consult:  65 year old African-American woman with past medical history significant for chronic congestive heart failure with reduced ejection fraction (EF 45%), nonischemic cardiomyopathy, hypothyroidism, hypertension, dyslipidemia, obstructive sleep apnea, obesity, systemic lupus erythematosus and chronic kidney disease stage IV at baseline (baseline creatinine apparently around 2.0) who presented to the hospital with worsening shortness of breath, lower extremity edema, palpitations and  dyspnea on exertion.  She was found to have volume overload with pulmonary edema along with atrial patient with RVR and was admitted to the ICU for cardiogenic shock.  Nephrology was originally consulted for management of her oliguric acute kidney injury secondary to cardiogenic shock/cardiorenal syndrome and appears to have been sluggishly improving at the time of sign off (creatinine down to 3.5 from 4.2) with decent diuresis.  Unfortunately, she remained on Lasix drip with suboptimal urine output and worsening renal function (BUN 96/creatinine 3.9) likely in part from recurrence of atrial fibrillation with RVR following DCCV.  She remains on milrinone and Levophed drip with elevated CVP of 22 cm of water.     Objective:   BP 127/77   Pulse (!) 106   Temp 97.8 F (36.6 C) (Oral)   Resp 15   Ht 5' 3.5" (1.613 m)   Wt 120.5 kg   SpO2 98%   BMI 46.32 kg/m   Intake/Output Summary (Last 24 hours) at 12/01/2020 0641 Last data filed at 12/01/2020 0522 Gross per 24 hour  Intake 2539.75 ml  Output 6203 ml  Net -3663.25 ml   Weight change: -2.8 kg  Physical Exam:   General adult female in bed in no acute distress seated in chair HEENT normocephalic atraumatic extraocular movements intact sclera anicteric Neck supple trachea midline Lungs clear to auscultation bilaterally normal work of breathing at rest Heart S1S2 no rub Abdomen soft nontender nondistended obese habitus Extremities 1+ edema with legs wrapped Psych normal mood and affect Neuro - alert and oriented x 3 provides hx and follows commands Access RIJ nontunneled dialysis catheter  Imaging: No results found.  Labs: [employer]  Lab 11/24/20 1411 11/25/20 0011 11/28/20 0230 11/28/20 1603 11/29/20 0414 11/29/20 1600 11/30/20 0345 11/30/20 1610 12/01/20 0410  NA 131*   < > 129* 130* 133* 133* 134* 134* 133*  K 3.9   < > 3.7 3.8 3.8 4.4 4.5 4.4 4.0  CL 89*   < > 86* 89* 95* 98 99 100 97*  CO2 29   < > 28 28 26  25 25 26 25   GLUCOSE 169*   < > 133* 167* 138* 149* 119* 133* 117*  BUN 93*   < > 98* 73* 50* 33* 22 17 16   CREATININE 4.00*   < > 4.10* 3.22* 2.36* 1.90* 1.60* 1.42* 1.88*  CALCIUM 9.4   < > 9.5 9.1 9.1 9.0 8.9 8.8* 9.5  PHOS 3.3  --   --  3.6 3.0 2.2* 2.3* 2.7 2.7   < > = values in this interval not displayed.   CBC Recent Labs  Lab 11/28/20 0230 11/29/20 0414 11/30/20 0345 12/01/20 0426  WBC 8.7 7.6 9.6 7.9  HGB 12.5 12.9 12.6 11.2*  HCT 37.0 39.5 38.7 36.5  MCV 96.9 98.0 101.6* 103.1*  PLT 225 242 250 225    Medications:     (feeding supplement) PROSource Plus  30 mL Oral BID BM   B-complex with vitamin C  1 tablet Oral Daily   calcitRIOL  0.25 mcg Oral Daily   Chlorhexidine Gluconate Cloth  6 each Topical Daily   clotrimazole  1 application Topical BID   estradiol  1 mg Oral Daily   febuxostat  40 mg Oral q AM   ferrous sulfate  325 mg Oral q morning   fluticasone  2 spray Each Nare Daily   fluticasone furoate-vilanterol  1 puff Inhalation Daily   gabapentin  100 mg Oral TID   insulin aspart  0-6 Units Subcutaneous TID WC   levothyroxine  88 mcg Oral q morning   lidocaine  1 patch Transdermal Q24H   midodrine  15 mg Oral TID WC   predniSONE  5 mg Oral Q breakfast   sodium chloride flush  10-40 mL Intracatheter Q12H   sodium chloride flush  3 mL Intravenous Q12H      [name], MD 12/01/2020,  6:54 AM

## 2020-12-01 NOTE — Progress Notes (Signed)
[facility] for Heparin Indication: atrial fibrillation  Patient Measurements: Height: 5' 3.5" (161.3 cm) Weight: 120.5 kg (265 lb 10.5 oz) IBW/kg (Calculated) : 53.55 Heparin Dosing Weight: 85 kg  Vital Signs: Temp: 98.4 F (36.9 C) (08/11 1530) Temp Source: Axillary (08/11 1530) BP: 119/79 (08/11 1600) Pulse Rate: 99 (08/11 1600)  Labs: Recent Labs    11/29/20 0414 11/29/20 1600 11/30/20 0345 11/30/20 1400 11/30/20 1610 12/01/20 0410 12/01/20 0426  HGB 12.9  --  12.6  --   --   --  11.2*  HCT 39.5  --  38.7  --   --   --  36.5  PLT 242  --  250  --   --   --  225  APTT 69*  --  121* 95*  --  81*  --   HEPARINUNFRC 0.63  --  0.75* 0.69  --  0.51  --   CREATININE 2.36*   < > 1.60*  --  1.42* 1.88*  --    < > = values in this interval not displayed.     Estimated Creatinine Clearance: 38.4 mL/min (A) (by C-G formula based on SCr of 1.88 mg/dL (H)).   Medical History: Past Medical History:  Diagnosis Date   A-fib ([place])    Cardiomyopathy    Hyperlipidemia    Hypertension    Lupus ([place])    Pulmonary hypertension ([place]) 05/10/2011   Echo, EF-40-45   Renal disorder    Sleep apnea 2008   CPAP, pt does not know settings   Thyroid disease     Medications:     Assessment: 65 y.o. female with  h/o Afib, DOAC on hold, for heparin.  Pt was on Xarelto PTA and changed to Eliquis due to ARF.  Eliquis 5 mg last given at 2317 7/31.   CRRT filter having repeated clotting episodes. Discussed with nephrology who would prefer to increase systemic heparin to higher goal range rather than start circuit heparin. Heparin level 0.51 this morning already on 1400 unit/h - will increase slightly to try to target upper end of range. May need to add low-fixed dose circuit heparin tomorrow if this does not help.  Goal of Therapy:  Heparin level 0.5-0.7 units/ml Monitor platelets by anticoagulation protocol: Yes   Plan:   Increase heparin to 1500 units/h Repeat heparin level in 8h  Arrie Senate, PharmD, [place], [place] Pharmacist [phone] Please check AMION for all [facility] numbers 12/01/2020

## 2020-12-01 NOTE — Plan of Care (Signed)
  Problem: Activity: Goal: Capacity to carry out activities will improve Outcome: Progressing   Problem: Cardiac: Goal: Ability to achieve and maintain adequate cardiopulmonary perfusion will improve Outcome: Progressing   Problem: Clinical Measurements: Goal: Ability to maintain clinical measurements within normal limits will improve Outcome: Progressing Goal: Respiratory complications will improve Outcome: Progressing   Problem: Activity: Goal: Risk for activity intolerance will decrease Outcome: Progressing   Problem: Nutrition: Goal: Adequate nutrition will be maintained Outcome: Progressing   Problem: Pain Managment: Goal: General experience of comfort will improve Outcome: Progressing

## 2020-12-02 ENCOUNTER — Telehealth: Payer: Self-pay | Admitting: Cardiovascular Disease

## 2020-12-02 DIAGNOSIS — R570 Cardiogenic shock: Secondary | ICD-10-CM | POA: Diagnosis not present

## 2020-12-02 DIAGNOSIS — I4891 Unspecified atrial fibrillation: Secondary | ICD-10-CM | POA: Diagnosis not present

## 2020-12-02 LAB — CBC
HCT: 36.5 % (ref 36.0–46.0)
Hemoglobin: 11.6 g/dL — ABNORMAL LOW (ref 12.0–15.0)
MCH: 32.2 pg (ref 26.0–34.0)
MCHC: 31.8 g/dL (ref 30.0–36.0)
MCV: 101.4 fL — ABNORMAL HIGH (ref 80.0–100.0)
Platelets: 188 10*3/uL (ref 150–400)
RBC: 3.6 MIL/uL — ABNORMAL LOW (ref 3.87–5.11)
RDW: 14 % (ref 11.5–15.5)
WBC: 8.6 10*3/uL (ref 4.0–10.5)
nRBC: 0.5 % — ABNORMAL HIGH (ref 0.0–0.2)

## 2020-12-02 LAB — RENAL FUNCTION PANEL
Albumin: 2.9 g/dL — ABNORMAL LOW (ref 3.5–5.0)
Albumin: 3 g/dL — ABNORMAL LOW (ref 3.5–5.0)
Anion gap: 10 (ref 5–15)
Anion gap: 10 (ref 5–15)
BUN: 14 mg/dL (ref 8–23)
BUN: 17 mg/dL (ref 8–23)
CO2: 25 mmol/L (ref 22–32)
CO2: 25 mmol/L (ref 22–32)
Calcium: 9.5 mg/dL (ref 8.9–10.3)
Calcium: 9 mg/dL (ref 8.9–10.3)
Chloride: 100 mmol/L (ref 98–111)
Chloride: 99 mmol/L (ref 98–111)
Creatinine, Ser: 1.83 mg/dL — ABNORMAL HIGH (ref 0.44–1.00)
Creatinine, Ser: 2.17 mg/dL — ABNORMAL HIGH (ref 0.44–1.00)
GFR, Estimated: 30 mL/min — ABNORMAL LOW (ref >60)
GFR, Estimated: 25 mL/min — ABNORMAL LOW (ref >60)
Glucose, Bld: 103 mg/dL — ABNORMAL HIGH (ref 70–99)
Glucose, Bld: 121 mg/dL — ABNORMAL HIGH (ref 70–99)
Phosphorus: 2.3 mg/dL — ABNORMAL LOW (ref 2.5–4.6)
Phosphorus: 2.6 mg/dL (ref 2.5–4.6)
Potassium: 3.7 mmol/L (ref 3.5–5.1)
Potassium: 3.8 mmol/L (ref 3.5–5.1)
Sodium: 135 mmol/L (ref 135–145)
Sodium: 134 mmol/L — ABNORMAL LOW (ref 135–145)

## 2020-12-02 LAB — HEPARIN LEVEL (UNFRACTIONATED): Heparin Unfractionated: 0.82 IU/mL — ABNORMAL HIGH (ref 0.30–0.70)

## 2020-12-02 LAB — GLUCOSE, CAPILLARY
Glucose-Capillary: 108 mg/dL — ABNORMAL HIGH (ref 70–99)
Glucose-Capillary: 126 mg/dL — ABNORMAL HIGH (ref 70–99)
Glucose-Capillary: 127 mg/dL — ABNORMAL HIGH (ref 70–99)
Glucose-Capillary: 113 mg/dL — ABNORMAL HIGH (ref 70–99)

## 2020-12-02 LAB — MAGNESIUM: Magnesium: 2.3 mg/dL (ref 1.7–2.4)

## 2020-12-02 LAB — COOXEMETRY PANEL
Carboxyhemoglobin: 1.6 % — ABNORMAL HIGH (ref 0.5–1.5)
Methemoglobin: 0.7 % (ref 0.0–1.5)
O2 Saturation: 77.3 %
Total hemoglobin: 11.5 g/dL — ABNORMAL LOW (ref 12.0–16.0)

## 2020-12-02 MED ORDER — SODIUM PHOSPHATES 45 MMOLE/15ML IV SOLN
20.0000 mmol | Freq: Once | INTRAVENOUS | Status: AC
  Administered 2020-12-02: 20 mmol via INTRAVENOUS
  Filled 2020-12-02: qty 6.67

## 2020-12-02 NOTE — Telephone Encounter (Signed)
Patient called in to say that she needs the supply for her cpap machine. Unsure of how she needs to go bout doing that. Please advise

## 2020-12-02 NOTE — Progress Notes (Signed)
Occupational Therapy Treatment Patient Details Name: Caitlyn Fuentes MRN: [id number] DOB: 08-12-1955 Today's Date: 12/02/2020    History of present illness Pt is a 65 y/o female presenting on 11/20/20 with worsening palpitations, SOB and LE edema. Pt found in rapid a fib, chest x-ray with significant vascular congestion. 8/1 hypotensive with cardiogenic shock, transferred to ICU and urgent cardioversion.   S/P TEE and cardioversion 8/4. CRRT started 8/8. PMH includes: afib, cardiomyopathy, HTN, lupus, PAF, CKD.   OT comments  Patient eager to participate in OT.  Completes BSC transfers (side stepping) with min guard +2 safety, toileting with min guard +2 safety with increased time. Visibly fatigued after toileting, voicing "I never knew that would be such a workout".  HR up to 133 with toileting, BP soft at end of session with RN present throughout.  Continue to recommend CIR.    Follow Up Recommendations  CIR    Equipment Recommendations  Other (comment) (TBD)    Recommendations for Other Services Rehab consult    Precautions / Restrictions Precautions Precautions: Fall Precaution Comments: watch HR, CRRT Restrictions Weight Bearing Restrictions: No       Mobility Bed Mobility               General bed mobility comments: OOB in recliner upon entry    Transfers Overall transfer level: Needs assistance   Transfers: Sit to/from Stand Sit to Stand: Min guard;+2 safety/equipment         General transfer comment: for safety/balance, increased time; guarding for lines    Balance Overall balance assessment: Mild deficits observed, not formally tested                                         ADL either performed or assessed with clinical judgement   ADL Overall ADL's : Needs assistance/impaired     Grooming: Set up;Sitting Grooming Details (indicate cue type and reason): seated in recliner                 Toilet Transfer: Min  guard;Ambulation;BSC;+2 for safety/equipment Toilet Transfer Details (indicate cue type and reason): side stepping to/from [facility] Toileting- Clothing Manipulation and Hygiene: Min guard;+2 for safety/equipment       Functional mobility during ADLs: Min guard;Cueing for safety;+2 for safety/equipment General ADL Comments: pt limited by decreased activity tolerance, generalized weakness     Vision       Perception     Praxis      Cognition Arousal/Alertness: Awake/alert Behavior During Therapy: WFL for tasks assessed/performed Overall Cognitive Status: Within Functional Limits for tasks assessed                                          Exercises     Shoulder Instructions       General Comments on CRRT with HR up to 133 with ADLs, soft BP 90s/60s at end of session    Pertinent Vitals/ Pain       Pain Assessment: No/denies pain  Home Living                                          Prior Functioning/Environment  Frequency  Min 2X/week        Progress Toward Goals  OT Goals(current goals can now be found in the care plan section)  Progress towards OT goals: Progressing toward goals  Acute Rehab OT Goals Patient Stated Goal: to get better OT Goal Formulation: With patient  Plan Discharge plan remains appropriate;Frequency remains appropriate    Co-evaluation                 AM-PAC OT "6 Clicks" Daily Activity     Outcome Measure   Help from another person eating meals?: None Help from another person taking care of personal grooming?: A Little Help from another person toileting, which includes using toliet, bedpan, or urinal?: A Little Help from another person bathing (including washing, rinsing, drying)?: A Lot Help from another person to put on and taking off regular upper body clothing?: A Little Help from another person to put on and taking off regular lower body clothing?: A Lot 6 Click Score:  17    End of Session    OT Visit Diagnosis: Other abnormalities of gait and mobility (R26.89);Muscle weakness (generalized) (M62.81)   Activity Tolerance Patient tolerated treatment well   Patient Left in chair;with call bell/phone within reach;with nursing/sitter in room   Nurse Communication Mobility status        Time: 1155-2080 OT Time Calculation (min): 28 min  Charges: OT General Charges $OT Visit: 1 Visit OT Treatments $Self Care/Home Management : 23-37 mins  [place] Pager [phone] [place] 12/02/2020, 11:57 AM

## 2020-12-02 NOTE — Progress Notes (Signed)
Patient ID: Caitlyn Fuentes, female   DOB: February 13, 1956, 65 y.o.   MRN: [id number] [facility] Progress Note    Assessment/ Plan:    1. Acute kidney Injury on chronic kidney disease stage IV: Nonoliguric and ongoing furosemide augmented earlier with the addition of metolazone.  CRRT started on 8/8 after nontunneled catheter with CHF team for volume management and clearance - Continue CRRT with UF goal net negative 150 ml/hr as tolerated.  Please page nephrology for UF goal changes  - 4K fluids - increase prefilter fluids to 500  - hopeful for transition to iHD soon  2.  Acute exacerbation of congestive heart failure: On milrinone for inotropic support along with norepinephrine  Optimize volume as above   3.  Hypokalemia: Secondary to urinary losses; improved and doing well on 4K  4.  Atrial fibrillation with rapid ventricular response: RVR with decreased cardiac output likely contributing to renal hypoperfusion/dysfunction.  Per CHF  5.  Hyponatremia: Secondary to acute exacerbation of congestive heart failure/worsening renal function and impaired free water handling.  Improved with CRRT  6. Hypophosphatemia - replete. Secondary to CRRT  Disposition - continue ICU monitoring   Subjective:   patient was anuric over 8/11.  She had 4.6 kg UF over 8/11 with CRRT.  filter clotted overnight and heparin was increased.  Per nursing report her UF goal was verbally increased at some point yesterday to 150 to 200 ml/hr; she has been getting 150 ml/hr off as has PVC's.  She has been on levo at 2 mcg/min and on milrinone.  Patient states "I can see my ankles for the first time in a long time"  Review of systems   She reports shortness of breath is overall much better; no overt shortness of breath Denies n/v Denies chest pain   Background on consult:  65 year old African-American woman with past medical history significant for chronic congestive heart failure with reduced ejection  fraction (EF 45%), nonischemic cardiomyopathy, hypothyroidism, hypertension, dyslipidemia, obstructive sleep apnea, obesity, systemic lupus erythematosus and chronic kidney disease stage IV at baseline (baseline creatinine apparently around 2.0) who presented to the hospital with worsening shortness of breath, lower extremity edema, palpitations and dyspnea on exertion.  She was found to have volume overload with pulmonary edema along with atrial patient with RVR and was admitted to the ICU for cardiogenic shock.  Nephrology was originally consulted for management of her oliguric acute kidney injury secondary to cardiogenic shock/cardiorenal syndrome and appears to have been sluggishly improving at the time of sign off (creatinine down to 3.5 from 4.2) with decent diuresis.  Unfortunately, she remained on Lasix drip with suboptimal urine output and worsening renal function (BUN 96/creatinine 3.9) likely in part from recurrence of atrial fibrillation with RVR following DCCV.  She remains on milrinone and Levophed drip with elevated CVP of 22 cm of water.     Objective:   BP 101/62 (BP Location: Right Wrist)   Pulse (!) 105   Temp (!) 97.4 F (36.3 C) (Oral)   Resp 20   Ht 5' 3.5" (1.613 m)   Wt 118.3 kg   SpO2 96%   BMI 45.47 kg/m   Intake/Output Summary (Last 24 hours) at 12/02/2020 0175 Last data filed at 12/02/2020 0400 Gross per 24 hour  Intake 1794.86 ml  Output 4920 ml  Net -3125.14 ml   Weight change: -2.2 kg  Physical Exam:   General adult female in bed in no acute distress seated in chair HEENT normocephalic atraumatic  extraocular movements intact sclera anicteric Neck supple trachea midline Lungs clear to auscultation bilaterally normal work of breathing at rest Heart S1S2 no rub Abdomen soft nontender nondistended obese habitus Extremities 1+ edema  Psych normal mood and affect Neuro - alert and oriented x 3 provides hx and follows commands Access RIJ nontunneled dialysis  catheter  Imaging: No results found.  Labs: BMET Recent Labs  Lab 11/29/20 0414 11/29/20 1600 11/30/20 0345 11/30/20 1610 12/01/20 0410 12/01/20 1702 12/02/20 0412  NA 133* 133* 134* 134* 133* 133* 135  K 3.8 4.4 4.5 4.4 4.0 4.2 3.7  CL 95* 98 99 100 97* 99 100  CO2 26 25 25 26 25 25 25   GLUCOSE 138* 149* 119* 133* 117* 154* 103*  BUN 50* 33* 22 17 16 17 14   CREATININE 2.36* 1.90* 1.60* 1.42* 1.88* 1.83* 1.83*  CALCIUM 9.1 9.0 8.9 8.8* 9.5 9.3 9.5  PHOS 3.0 2.2* 2.3* 2.7 2.7 2.2* 2.3*   CBC Recent Labs  Lab 11/28/20 0230 11/29/20 0414 11/30/20 0345 12/01/20 0426  WBC 8.7 7.6 9.6 7.9  HGB 12.5 12.9 12.6 11.2*  HCT 37.0 39.5 38.7 36.5  MCV 96.9 98.0 101.6* 103.1*  PLT 225 242 250 225    Medications:     (feeding supplement) PROSource Plus  30 mL Oral BID BM   B-complex with vitamin C  1 tablet Oral Daily   calcitRIOL  0.25 mcg Oral Daily   Chlorhexidine Gluconate Cloth  6 each Topical Daily   clotrimazole  1 application Topical BID   estradiol  1 mg Oral Daily   febuxostat  40 mg Oral q AM   ferrous sulfate  325 mg Oral q morning   fluticasone  2 spray Each Nare Daily   fluticasone furoate-vilanterol  1 puff Inhalation Daily   gabapentin  100 mg Oral TID   insulin aspart  0-6 Units Subcutaneous TID WC   levothyroxine  88 mcg Oral q morning   lidocaine  1 patch Transdermal Q24H   midodrine  15 mg Oral TID WC   predniSONE  5 mg Oral Q breakfast   senna-docusate  2 tablet Oral BID   sodium chloride flush  10-40 mL Intracatheter Q12H   sodium chloride flush  3 mL Intravenous Q12H      [name], MD 12/02/2020,  6:54 AM

## 2020-12-02 NOTE — Telephone Encounter (Signed)
Called patient, number was busy.  Will route to sleep coordinator to advise.  Thank you!

## 2020-12-02 NOTE — Progress Notes (Signed)
Orthopedic Tech Progress Note Patient Details:  Lavette Yankovich May 02, 1955 [id number] Applied BLE unna boots. Notified RN about new scrape on RLE before applying new pair. Ortho Devices Type of Ortho Device: Haematologist Ortho Device/Splint Location: BLE Ortho Device/Splint Interventions: Ordered, Application   Post Interventions Patient Tolerated: Well Instructions Provided: Adjustment of device  Petra Kuba 12/02/2020, 11:11 AM

## 2020-12-02 NOTE — Progress Notes (Signed)
[place] for Heparin Indication: atrial fibrillation  Patient Measurements: Height: 5' 3.5" (161.3 cm) Weight: 120.5 kg (265 lb 10.5 oz) IBW/kg (Calculated) : 53.55 Heparin Dosing Weight: 85 kg  Vital Signs: Temp: 97.9 F (36.6 C) (08/11 2000) Temp Source: Oral (08/11 2000) BP: 105/67 (08/12 0100) Pulse Rate: 97 (08/12 0100)  Labs: Recent Labs    11/29/20 0414 11/29/20 1600 11/30/20 0345 11/30/20 1400 11/30/20 1610 12/01/20 0410 12/01/20 0426 12/01/20 1702 12/02/20 0010  HGB 12.9  --  12.6  --   --   --  11.2*  --   --   HCT 39.5  --  38.7  --   --   --  36.5  --   --   PLT 242  --  250  --   --   --  225  --   --   APTT 69*  --  121* 95*  --  81*  --   --   --   HEPARINUNFRC 0.63  --  0.75* 0.69  --  0.51  --   --  0.82*  CREATININE 2.36*   < > 1.60*  --  1.42* 1.88*  --  1.83*  --    < > = values in this interval not displayed.     Estimated Creatinine Clearance: 39.4 mL/min (A) (by C-G formula based on SCr of 1.83 mg/dL (H)).   Medical History: Past Medical History:  Diagnosis Date   A-fib ([place])    Cardiomyopathy    Hyperlipidemia    Hypertension    Lupus ([place])    Pulmonary hypertension ([place]) 05/10/2011   Echo, EF-40-45   Renal disorder    Sleep apnea 2008   CPAP, pt does not know settings   Thyroid disease     Medications:     Assessment: 65 y.o. female with  h/o Afib, DOAC on hold, for heparin.  Pt was on Xarelto PTA and changed to Eliquis due to ARF.  Eliquis 5 mg last given at 2317 7/31.   CRRT filter having repeated clotting episodes. Discussed with nephrology who would prefer to increase systemic heparin to higher goal range rather than start circuit heparin. Heparin level 0.51 this morning already on 1400 unit/h - will increase slightly to try to target upper end of range. May need to add low-fixed dose circuit heparin tomorrow if this does not help.  8/12 AM update:  Heparin level  above goal  Goal of Therapy:  Heparin level 0.5-0.7 units/ml Monitor platelets by anticoagulation protocol: Yes   Plan:  Dec heparin to 1400 units/hr 1000 heparin level  Narda Bonds, PharmD, [place] Pharmacist Phone: [phone]

## 2020-12-02 NOTE — Progress Notes (Signed)
Inpatient Rehabilitation Admissions Coordinator   Inpatient rehab consult received, I met with patient at bedside for assessment Currently on CRRT. I discussed goals and expectations of a possible Cir admit once medically ready pending her progress with therapy. 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 Her family is currently providing for his care. I will follow up next week.  Danne Baxter, RN, MSN Rehab Admissions Coordinator [phone] 12/02/2020 1:43 PM

## 2020-12-02 NOTE — Progress Notes (Signed)
Advanced Heart Failure Rounding Note  PCP-Cardiologist: Shelva Majestic, MD  HF clinic: Dr. Haroldine Laws  Subjective:   Remains on NE at 2 and milrinone at 0.375.  Co-ox of 77.3. CVP of 7-8.  Rhythm still sinus tachycardia with PVCs.  Net I/O is -2.7L with weight down 5 pounds.  CVVHD catheter clotted and attempting to resume, but prior to stopping, volume removal was at 150 mL/hr.   - 8/1 A fib RVR & A/C systolic heart faiure --> cardiogenic shock. Had urgent DC-CV with brief conversion to NSR but went back in A fib. . On Norepi + milrinone. AKI. Nephrology consulted.  - 8/2 A fib RVR, amio gtt. Continued on milrinone 0.375 mcg + norepi 2 mcg. Started on prednisone 40 mg daily x3 days for gout flare.  - 8/3 did not tolerate milrinone wean w/ drop in Co-ox 68%--->48% and decrease in UOP. Milrinone increased back to 0.375.  Lasix gtt increased to 30/hr.  - 8/4 NE added back given persistently low Co-ox - 8/4 s/p TEE/DCCV>>NSR  - 8/5 back in Afib w/ RVR  -  08/09 HD cath placed, CRRT started - 8/10: conversion to sinus tachycardia   She is sitting up in a chair in no acute distress. She feels much better and denies any shortness of breath, chest pain or abdominal pain.    Objective:   Weight Range: 118.3 kg Body mass index is 45.47 kg/m.   Vital Signs:   Temp:  [97.4 F (36.3 C)-98.5 F (36.9 C)] 98.1 F (36.7 C) (08/12 0754) Pulse Rate:  [95-132] 108 (08/12 0700) Resp:  [13-28] 24 (08/12 0700) BP: (96-124)/(61-86) 118/71 (08/12 0700) SpO2:  [90 %-100 %] 96 % (08/12 0700) Weight:  [118.3 kg] 118.3 kg (08/12 0500) Last BM Date: 11/29/20  Weight change: Filed Weights   11/30/20 0500 12/01/20 0500 12/02/20 0500  Weight: 123.3 kg 120.5 kg 118.3 kg    Intake/Output:   Intake/Output Summary (Last 24 hours) at 12/02/2020 0800 Last data filed at 12/02/2020 0601 Gross per 24 hour  Intake 1628.48 ml  Output 4426 ml  Net -2797.52 ml      Physical Exam  CVP 7-8 General:   Sitting up in a chair in no acute distress HEENT: normal Neck: supple. +JVD.  RIJ HD catheter, LIJ CVC. Carotids 2+ bilat; no bruits. No lymphadenopathy or thryomegaly appreciated. Cor: PMI nondisplaced. Regular, tachycardic. No rubs, gallops or murmurs. Lungs: clear Abdomen: obese soft, nontender, nondistended. No hepatosplenomegaly. No bruits or masses. Good bowel sounds. Extremities: no cyanosis, clubbing, rash, 1+ edema to BLE  Neuro: alert & orientedx3, cranial nerves grossly intact. moves all 4 extremities w/o difficulty. Affect pleasant   Telemetry   ST in 110s, personally reviewed.    Labs    CBC Recent Labs    11/30/20 0345 12/01/20 0426  WBC 9.6 7.9  HGB 12.6 11.2*  HCT 38.7 36.5  MCV 101.6* 103.1*  PLT 250 263    Basic Metabolic Panel Recent Labs    12/01/20 0410 12/01/20 1702 12/02/20 0412  NA 133* 133* 135  K 4.0 4.2 3.7  CL 97* 99 100  CO2 25 25 25   GLUCOSE 117* 154* 103*  BUN 16 17 14   CREATININE 1.88* 1.83* 1.83*  CALCIUM 9.5 9.3 9.5  MG 2.5*  --  2.3  PHOS 2.7 2.2* 2.3*    Liver Function Tests Recent Labs    12/01/20 1702 12/02/20 0412  ALBUMIN 3.1* 2.9*    No results for input(s):  LIPASE, AMYLASE in the last 72 hours. Cardiac Enzymes No results for input(s): CKTOTAL, CKMB, CKMBINDEX, TROPONINI in the last 72 hours.  BNP: BNP (last 3 results) Recent Labs    11/20/20 0318 11/21/20 0258  BNP 2,051.3* 2,022.4*     ProBNP (last 3 results) No results for input(s): PROBNP in the last 8760 hours.   D-Dimer No results for input(s): DDIMER in the last 72 hours. Hemoglobin A1C No results for input(s): HGBA1C in the last 72 hours.  Fasting Lipid Panel No results for input(s): CHOL, HDL, LDLCALC, TRIG, CHOLHDL, LDLDIRECT in the last 72 hours. Thyroid Function Tests No results for input(s): TSH, T4TOTAL, T3FREE, THYROIDAB in the last 72 hours.  Invalid input(s): FREET3   Other results:   Imaging    No results  found.   Medications:     Scheduled Medications:  (feeding supplement) PROSource Plus  30 mL Oral BID BM   B-complex with vitamin C  1 tablet Oral Daily   calcitRIOL  0.25 mcg Oral Daily   Chlorhexidine Gluconate Cloth  6 each Topical Daily   clotrimazole  1 application Topical BID   estradiol  1 mg Oral Daily   febuxostat  40 mg Oral q AM   ferrous sulfate  325 mg Oral q morning   fluticasone  2 spray Each Nare Daily   fluticasone furoate-vilanterol  1 puff Inhalation Daily   gabapentin  100 mg Oral TID   insulin aspart  0-6 Units Subcutaneous TID WC   levothyroxine  88 mcg Oral q morning   lidocaine  1 patch Transdermal Q24H   midodrine  15 mg Oral TID WC   predniSONE  5 mg Oral Q breakfast   senna-docusate  2 tablet Oral BID   sodium chloride flush  10-40 mL Intracatheter Q12H   sodium chloride flush  3 mL Intravenous Q12H    Infusions:   prismasol BGK 4/2.5 400 mL/hr at 12/02/20 0203    prismasol BGK 4/2.5 300 mL/hr at 12/01/20 1501   sodium chloride     amiodarone 30 mg/hr (12/02/20 0700)   heparin 1,400 Units/hr (12/02/20 0700)   milrinone 0.375 mcg/kg/min (12/02/20 0700)   norepinephrine (LEVOPHED) Adult infusion 2 mcg/min (12/02/20 0700)   prismasol BGK 4/2.5 1,500 mL/hr at 12/02/20 0631   sodium phosphate  Dextrose 5% IVPB      PRN Medications: sodium chloride, albuterol, cyclobenzaprine, heparin, HYDROcodone-acetaminophen, hydrocortisone cream, ondansetron (ZOFRAN) IV, oxyCODONE, sodium chloride flush, sodium chloride flush    Patient Profile   Caitlyn Fuentes is a 65 year old with a history of chronic systolic hf previously EF 45%,  NICM, hypothyroid, PAF, gout, OSA, CKD Stage IV, HTN, hyperlipidemia, and lupus.   Admitted with A fib RVR and A/C systolic heart failure complicated by cardiogenic shock.     Assessment/Plan    1. Acute/Chronic Systolic Heart Failure Cardiogenic shock - 8/1 Lactic acid 1.5. CO-OX 43%.--> milrinone increased 0.375 mcg +  norepe 4 mcg. CO-OX repeated with improvement.  - Echo 7/22 EF 30-35% in 2021 -> 25-30%.  - Back on dual inotropes, Milrinone 0.375 + NE 2. Co-ox 77.3 - Will decrease milrinone when euvolemic - Failed DCCV x2, but converted to sinus tachycardia on 8/10 - Volume optimization via CVVHD  - Unable to tolerate GDMT at this time while on Levophed   2. PAF--> Afib RVR - Developed shock and had urgent cardioversion 8/1 with conversion to NSR w/ ERAF - Repeat DCCV 8/4 w/ ERAF - Conversion to sinus  tachycardia on 8/10  - Continue amiodarone at 30 mg/hr  - unfortunately not a candidate for ablation due to size if AF were to recur. Not candidate for AVN ablation and CRT. - Continue heparin gtt   3. AKI on CKD Stage IV - Baseline appears to be around 3.2  - SCr  4.1 -> 3.8 -> 3.9 -> 4.10 > 3.22 > 2.36 > 1.9 > 1.6 > 1.4 > 1.88 > 1.83 - BUN improving -- was 98, now 14 - Renal US with no acute findings - Continue CVVHD with iHD trial as per nephrology.  Hopeful she will tolerate now that sinus rhythm is restored    4. OSA - Uses CPAP, but refused overnight    5. Hypothyroidism -TSH 8. T3 ok and T4 2.3  -On synthroid. Dose increased to 88 mcg -Repeat TSH in 6 weeks.    6. Gout - Uric acid 10. Pain RLE ? Flare  - On uloric 40 mg daily  - Completed 3 day course of prednisone    7. Hypokalemia -- now resolved - K of 3.7 today -- replete  - Daily potassium supplementation stopped  - Monitor closely  8. Deconditioning - Continue PT/OT.  - needs HHPT. ? SNF  9. Hypervolemic hyponatremia -- improving - Na 129>>132 > 131 > 132 > 129  > 130 > 133 > 134 > 133 > 135  - limit free H2O  10. Lupus - on low-dose prednisone.  Length of Stay: [place], NP  12/02/2020, 8:00 AM  Advanced Heart Failure Team Pager [phone] (M-F; 7a - 5p)  Please contact [facility] for night-coverage after hours (5p -7a ) and weekends on [email or web address]  Agree with above  She remains on CVVHD.  Currently on NE 2 and milrinone 0.375 to support BP and cardiac output. Co-ox 77%. Remains in NSR on IV amio. Has had good fluid removal. Breathing better. No orthopnea or PND. She is excited to see her ankles again.   General:  Sitting up in chair. No resp difficulty HEENT: normal Neck: supple. RIJ HD cath. Carotids 2+ bilat; no bruits. No lymphadenopathy or thryomegaly appreciated. Cor: PMI nondisplaced. Regular tachy No rubs, gallops or murmurs. Lungs: clear Abdomen: obese soft, nontender, nondistended. No hepatosplenomegaly. No bruits or masses. Good bowel sounds. Extremities: no cyanosis, clubbing, rash, 1-2+ edema + UNNA Neuro: alert & orientedx3, cranial nerves grossly intact. moves all 4 extremities w/o difficulty. Affect pleasant  Will continue fluid removal with CVVHD. Can wean milrinone to 0.25. Continue heparin. Once volume off will need to see if she can tolerate iHD off pressors.   CRITICAL CARE Performed by: Glori Bickers  Total critical care time: 35 minutes  Critical care time was exclusive of separately billable procedures and treating other patients.  Critical care was necessary to treat or prevent imminent or life-threatening deterioration.  Critical care was time spent personally by me (independent of midlevel providers or residents) on the following activities: development of treatment plan with patient and/or surrogate as well as nursing, discussions with consultants, evaluation of patient's response to treatment, examination of patient, obtaining history from patient or surrogate, ordering and performing treatments and interventions, ordering and review of laboratory studies, ordering and review of radiographic studies, pulse oximetry and re-evaluation of patient's condition.  Glori Bickers, MD  6:34 PM

## 2020-12-02 NOTE — Progress Notes (Signed)
Nutrition Follow Up  DOCUMENTATION CODES:   Not applicable  INTERVENTION:   Continue ProSource Plus 30 ml BID, each supplement provides 100 kcals and 15 grams protein.  Continue B complex with Vitamin C to account for losses with CRRT  NUTRITION DIAGNOSIS:   Increased nutrient needs related to acute illness as evidenced by estimated needs.  Ongoing   GOAL:   Patient will meet greater than or equal to 90% of their needs  Meeting   MONITOR:   PO intake, Supplement acceptance, Weight trends, I & O's, Labs  REASON FOR ASSESSMENT:   Rounds    ASSESSMENT:   Patient with PMH significant for cardiomyopathy, HLD, HTN, pulmonary HTN, COPD, CKD IV, and gout. Presents this admission with CHF exacerbation.  08/1- afib RVR, cardiogenic shock, had urgent DCCV 08/4- s/p TEE, DCCV 08/8- start CRRT  Continues on CRRT, pulling 150 ml/hr. Hopeful to transition to iHD soon.   Had a bowel movement yesterday. Appetite remains stable. Last five meal completions charted as 100%. Taking Prosource twice daily. Noted phosphorus low and being replaced.   Admission weight: 123.3 kg  Current weight: 118.3 kg   UOP 125 ml x 24 hrs  CRRT: 4664 ml x 24 hrs   Drips: sodium phosphate  Medications: calcitriol, SS novolog, prednisone, senokot  Labs: Phosphorus 2.3 (L) CBG 108-126  Diet Order:   Diet Order             Diet renal with fluid restriction Fluid restriction: 1200 mL Fluid; Room service appropriate? Yes; Fluid consistency: Thin  Diet effective now                   EDUCATION NEEDS:   Education needs have been addressed  Skin:  Skin Assessment: Reviewed RN Assessment  Last BM:  8/11  Height:   Ht Readings from Last 1 Encounters:  11/20/20 5' 3.5" (1.613 m)    Weight:   Wt Readings from Last 1 Encounters:  12/02/20 118.3 kg    BMI:  Body mass index is 45.47 kg/m.  Estimated Nutritional Needs:   Kcal:  1800-2000 kcal  Protein:  95-115 grams  Fluid:   >/= 1.8 L/day   Mariana Single MS, RD, LDN, CNSC Clinical Nutrition Pager listed in [place]

## 2020-12-03 DIAGNOSIS — R570 Cardiogenic shock: Secondary | ICD-10-CM | POA: Diagnosis not present

## 2020-12-03 DIAGNOSIS — I4891 Unspecified atrial fibrillation: Secondary | ICD-10-CM | POA: Diagnosis not present

## 2020-12-03 LAB — RENAL FUNCTION PANEL
Albumin: 3.2 g/dL — ABNORMAL LOW (ref 3.5–5.0)
Anion gap: 10 (ref 5–15)
BUN: 15 mg/dL (ref 8–23)
CO2: 23 mmol/L (ref 22–32)
Calcium: 9.3 mg/dL (ref 8.9–10.3)
Chloride: 99 mmol/L (ref 98–111)
Creatinine, Ser: 2.04 mg/dL — ABNORMAL HIGH (ref 0.44–1.00)
GFR, Estimated: 27 mL/min — ABNORMAL LOW (ref >60)
Glucose, Bld: 88 mg/dL (ref 70–99)
Phosphorus: 2.4 mg/dL — ABNORMAL LOW (ref 2.5–4.6)
Potassium: 4 mmol/L (ref 3.5–5.1)
Sodium: 132 mmol/L — ABNORMAL LOW (ref 135–145)

## 2020-12-03 LAB — CBC
HCT: 36.5 % (ref 36.0–46.0)
Hemoglobin: 11.3 g/dL — ABNORMAL LOW (ref 12.0–15.0)
MCH: 32 pg (ref 26.0–34.0)
MCHC: 31 g/dL (ref 30.0–36.0)
MCV: 103.4 fL — ABNORMAL HIGH (ref 80.0–100.0)
Platelets: 136 10*3/uL — ABNORMAL LOW (ref 150–400)
RBC: 3.53 MIL/uL — ABNORMAL LOW (ref 3.87–5.11)
RDW: 13.9 % (ref 11.5–15.5)
WBC: 9.3 10*3/uL (ref 4.0–10.5)
nRBC: 0.4 % — ABNORMAL HIGH (ref 0.0–0.2)

## 2020-12-03 LAB — MAGNESIUM: Magnesium: 2.4 mg/dL (ref 1.7–2.4)

## 2020-12-03 LAB — GLUCOSE, CAPILLARY
Glucose-Capillary: 135 mg/dL — ABNORMAL HIGH (ref 70–99)
Glucose-Capillary: 121 mg/dL — ABNORMAL HIGH (ref 70–99)
Glucose-Capillary: 120 mg/dL — ABNORMAL HIGH (ref 70–99)
Glucose-Capillary: 101 mg/dL — ABNORMAL HIGH (ref 70–99)

## 2020-12-03 LAB — COOXEMETRY PANEL
Carboxyhemoglobin: 1.8 % — ABNORMAL HIGH (ref 0.5–1.5)
Methemoglobin: 0.8 % (ref 0.0–1.5)
O2 Saturation: 55.9 %
Total hemoglobin: 12.3 g/dL (ref 12.0–16.0)

## 2020-12-03 LAB — HEPARIN LEVEL (UNFRACTIONATED): Heparin Unfractionated: 0.6 IU/mL (ref 0.30–0.70)

## 2020-12-03 MED ORDER — SODIUM PHOSPHATES 45 MMOLE/15ML IV SOLN
10.0000 mmol | Freq: Once | INTRAVENOUS | Status: AC
  Administered 2020-12-03: 10 mmol via INTRAVENOUS
  Filled 2020-12-03: qty 3.33

## 2020-12-03 NOTE — Progress Notes (Signed)
Pt declined CPAP for tonight stating she may try tomorrow night.

## 2020-12-03 NOTE — Progress Notes (Signed)
Advanced Heart Failure Rounding Note  PCP-Cardiologist: Shelva Majestic, MD  HF clinic: Dr. Haroldine Laws  Subjective:    Events: - 8/1 A fib RVR & A/C systolic heart faiure --> cardiogenic shock. Had urgent DC-CV with brief conversion to NSR but went back in A fib. . On Norepi + milrinone. AKI. Nephrology consulted.  - 8/2 A fib RVR, amio gtt. Continued on milrinone 0.375 mcg + norepi 2 mcg. Started on prednisone 40 mg daily x3 days for gout flare.  - 8/3 did not tolerate milrinone wean w/ drop in Co-ox 68%--->48% and decrease in UOP. Milrinone increased back to 0.375.  Lasix gtt increased to 30/hr.  - 8/4 NE added back given persistently low Co-ox - 8/4 s/p TEE/DCCV>>NSR  - 8/5 back in Afib w/ RVR  -  08/09 HD cath placed, CRRT started - 8/10: conversion to sinus tachycardia    Remains on CVVHD pulling -150/hr. Weight dow another 5 pounds. (27 pounds total) NE weaned off last night. Remains on milrinone 0.375. Co-ox 56%  CVP 5  Remains in NSR. Denies SOB, orthopnea or PND.  Objective:   Weight Range: 115.7 kg Body mass index is 44.48 kg/m.   Vital Signs:   Temp:  [97.6 F (36.4 C)-98.1 F (36.7 C)] 97.8 F (36.6 C) (08/13 0600) Pulse Rate:  [38-124] 117 (08/13 0830) Resp:  [13-27] 15 (08/13 0830) BP: (79-122)/(43-91) 107/74 (08/13 0830) SpO2:  [91 %-100 %] 97 % (08/13 0830) Weight:  [115.7 kg] 115.7 kg (08/13 0600) Last BM Date: 12/02/20  Weight change: Filed Weights   12/01/20 0500 12/02/20 0500 12/03/20 0600  Weight: 120.5 kg 118.3 kg 115.7 kg    Intake/Output:   Intake/Output Summary (Last 24 hours) at 12/03/2020 0936 Last data filed at 12/03/2020 0900 Gross per 24 hour  Intake 2292.31 ml  Output 5355 ml  Net -3062.69 ml      Physical Exam   General:  Sitting in chair  No resp difficulty HEENT: normal Neck: supple. JVP 7-8  RIJ HD cath Carotids 2+ bilat; no bruits. No lymphadenopathy or thryomegaly appreciated. Cor: PMI nondisplaced. Regular tachy. No  rubs, gallops or murmurs. Lungs: clear Abdomen: obese soft, nontender, nondistended. No hepatosplenomegaly. No bruits or masses. Good bowel sounds. Extremities: no cyanosis, clubbing, rash, 1+ edema (wrapped) Neuro: alert & orientedx3, cranial nerves grossly intact. moves all 4 extremities w/o difficulty. Affect pleasant  Telemetry   Sinus tach 110-120  Personally reviewed   Labs    CBC Recent Labs    12/02/20 0730 12/03/20 0450  WBC 8.6 9.3  HGB 11.6* 11.3*  HCT 36.5 36.5  MCV 101.4* 103.4*  PLT 188 136*    Basic Metabolic Panel Recent Labs    12/02/20 0412 12/02/20 1526 12/03/20 0450  NA 135 134* 132*  K 3.7 3.8 4.0  CL 100 99 99  CO2 25 25 23   GLUCOSE 103* 121* 88  BUN 14 17 15   CREATININE 1.83* 2.17* 2.04*  CALCIUM 9.5 9.0 9.3  MG 2.3  --  2.4  PHOS 2.3* 2.6 2.4*    Liver Function Tests Recent Labs    12/02/20 1526 12/03/20 0450  ALBUMIN 3.0* 3.2*    No results for input(s): LIPASE, AMYLASE in the last 72 hours. Cardiac Enzymes No results for input(s): CKTOTAL, CKMB, CKMBINDEX, TROPONINI in the last 72 hours.  BNP: BNP (last 3 results) Recent Labs    11/20/20 0318 11/21/20 0258  BNP 2,051.3* 2,022.4*     ProBNP (last 3 results)  No results for input(s): PROBNP in the last 8760 hours.   D-Dimer No results for input(s): DDIMER in the last 72 hours. Hemoglobin A1C No results for input(s): HGBA1C in the last 72 hours.  Fasting Lipid Panel No results for input(s): CHOL, HDL, LDLCALC, TRIG, CHOLHDL, LDLDIRECT in the last 72 hours. Thyroid Function Tests No results for input(s): TSH, T4TOTAL, T3FREE, THYROIDAB in the last 72 hours.  Invalid input(s): FREET3   Other results:   Imaging    No results found.   Medications:     Scheduled Medications:  (feeding supplement) PROSource Plus  30 mL Oral BID BM   B-complex with vitamin C  1 tablet Oral Daily   calcitRIOL  0.25 mcg Oral Daily   Chlorhexidine Gluconate Cloth  6 each  Topical Daily   clotrimazole  1 application Topical BID   estradiol  1 mg Oral Daily   febuxostat  40 mg Oral q AM   ferrous sulfate  325 mg Oral q morning   fluticasone  2 spray Each Nare Daily   fluticasone furoate-vilanterol  1 puff Inhalation Daily   gabapentin  100 mg Oral TID   insulin aspart  0-6 Units Subcutaneous TID WC   levothyroxine  88 mcg Oral q morning   lidocaine  1 patch Transdermal Q24H   midodrine  15 mg Oral TID WC   predniSONE  5 mg Oral Q breakfast   senna-docusate  2 tablet Oral BID   sodium chloride flush  10-40 mL Intracatheter Q12H   sodium chloride flush  3 mL Intravenous Q12H    Infusions:   prismasol BGK 4/2.5 600 mL/hr at 12/03/20 0106    prismasol BGK 4/2.5 300 mL/hr at 12/03/20 0612   sodium chloride     amiodarone 30 mg/hr (12/03/20 0800)   heparin 1,450 Units/hr (12/03/20 0804)   milrinone 0.375 mcg/kg/min (12/03/20 0800)   norepinephrine (LEVOPHED) Adult infusion Stopped (12/02/20 0751)   prismasol BGK 4/2.5 1,500 mL/hr at 12/03/20 0607   sodium phosphate  Dextrose 5% IVPB 10 mmol (12/03/20 0802)    PRN Medications: sodium chloride, albuterol, cyclobenzaprine, heparin, HYDROcodone-acetaminophen, hydrocortisone cream, ondansetron (ZOFRAN) IV, oxyCODONE, sodium chloride flush, sodium chloride flush    Patient Profile   Caitlyn Fuentes is a 65 year old with a history of chronic systolic hf previously EF 45%,  NICM, hypothyroid, PAF, gout, OSA, CKD Stage IV, HTN, hyperlipidemia, and lupus.   Admitted with A fib RVR and A/C systolic heart failure complicated by cardiogenic shock.     Assessment/Plan    1. Acute/Chronic Systolic Heart Failure -> Cardiogenic shock in setting AF with RVR - 8/1 Lactic acid 1.5. CO-OX 43%.--> started on milrinone and NE - Echo 7/22 EF 30-35% (2021)  This admit -> 25-30%. - NE weaned of overnight. Remains on milrinone 0.375. Co-ox marginal at 56% - Volume status much improved with CVVHD. CVP 5.  Plan to switch to  iHD soon per Renal - Unable to tolerate GDMT due to hypotension and AKI - Not candidate for advanced therapies with size and ESRD if unable to tolerate iHD off pressors would require Hospice evaluation   2. PAF--> Afib RVR - Developed shock and had urgent cardioversion 8/1 with conversion to NSR w/ ERAF - Repeat DCCV 8/4 w/ ERAF - Conversion to sinus tachycardia on 8/10 with IV amio - Maintaining sinus. Continue amiodarone at 30 mg/hr while on inotropes  - unfortunately not a candidate for ablation due to size if AF were to recur.  Not candidate for AVN ablation and CRT. - Continue heparin gtt   3. AKI on CKD Stage IV -> ESRD - Baseline SCr ~3.2  - Renal US with no acute findings - Continue CVVHD with plan for iHD in next day or two   4. OSA - Uses CPAP, but refused overnight    5. Hypothyroidism -TSH 8. T3 ok and T4 2.3  -On synthroid. Dose increased to 88 mcg -Repeat TSH in 6 weeks.    6. Gout - Uric acid 10. Pain RLE ? Flare  - On uloric 40 mg daily  - Completed 3 day course of prednisone    7. Hypokalemia -- now resolved - K 4.0 today  8. Deconditioning - Continue PT/OT.  - needs HHPT. ? SNF  9. Hypervolemic hyponatremia -- improving - Na 132 today - limit free H2O  10. Lupus - on low-dose prednisone.  CRITICAL CARE Performed by: Glori Bickers  Total critical care time: 35 minutes  Critical care time was exclusive of separately billable procedures and treating other patients.  Critical care was necessary to treat or prevent imminent or life-threatening deterioration.  Critical care was time spent personally by me (independent of midlevel providers or residents) on the following activities: development of treatment plan with patient and/or surrogate as well as nursing, discussions with consultants, evaluation of patient's response to treatment, examination of patient, obtaining history from patient or surrogate, ordering and performing treatments and  interventions, ordering and review of laboratory studies, ordering and review of radiographic studies, pulse oximetry and re-evaluation of patient's condition.  Length of Stay: [place], MD  12/03/2020, 9:36 AM  Advanced Heart Failure Team Pager [phone] (M-F; 7a - 5p)  Please contact [facility] for night-coverage after hours (5p -7a ) and weekends on [email or web address]

## 2020-12-03 NOTE — Progress Notes (Signed)
Patient ID: Caitlyn Fuentes, female   DOB: 1955-08-30, 65 y.o.   MRN: [id number] [facility] Progress Note    Assessment/ Plan:    1. Acute kidney Injury on chronic kidney disease stage IV: Cardiorenal.  Nonoliguric and ongoing furosemide augmented earlier with the addition of metolazone.  Baseline Cr likely around 2 per charting but no outpatient labs available previously.  CRRT started on 8/8 after nontunneled catheter with CHF team for volume management and clearance - Continue CRRT with UF goal net negative 150 ml/hr as tolerated.  Please page nephrology for UF goal changes  - 4K fluids - increased prefilter fluids to 500 and changed to M150 - hopeful for transition to iHD soon - consider on 8/15  2.  Acute exacerbation of congestive heart failure: On milrinone for inotropic support along with norepinephrine  Optimize volume as above   3.  Hypokalemia: Secondary to urinary losses; improved and doing well on 4K  4.  Atrial fibrillation with rapid ventricular response: RVR with decreased cardiac output likely contributing to renal hypoperfusion/dysfunction.  Has converted.  Per CHF team  5.  Hyponatremia: Secondary to acute exacerbation of congestive heart failure/worsening renal function and impaired free water handling.  Improved with CRRT  6. Hypophosphatemia - replete PRN. Secondary to CRRT  Disposition - continue ICU monitoring   Subjective:   patient had 125 mL uop over 8/12.  She had 5.0 kg UF over 8/12 with CRRT.  Goal UF of net neg 150 ml/hr.  filter clotted and was changed to M150.  She has been off of levo and on milrinone.  She has remained in sinus rhythm.  Feels good.   Review of systems  She reports shortness of breath is overall much better; no overt shortness of breath Some nausea no vomiting  Denies chest pain  Leg swelling feels much better - legs still hurt  Background on consult:  65 year old African-American woman with past medical history  significant for chronic congestive heart failure with reduced ejection fraction (EF 45%), nonischemic cardiomyopathy, hypothyroidism, hypertension, dyslipidemia, obstructive sleep apnea, obesity, systemic lupus erythematosus and chronic kidney disease stage IV at baseline (baseline creatinine apparently around 2.0) who presented to the hospital with worsening shortness of breath, lower extremity edema, palpitations and dyspnea on exertion.  She was found to have volume overload with pulmonary edema along with atrial patient with RVR and was admitted to the ICU for cardiogenic shock.  Nephrology was originally consulted for management of her oliguric acute kidney injury secondary to cardiogenic shock/cardiorenal syndrome and appears to have been sluggishly improving at the time of sign off (creatinine down to 3.5 from 4.2) with decent diuresis.  Unfortunately, she remained on Lasix drip with suboptimal urine output and worsening renal function (BUN 96/creatinine 3.9) likely in part from recurrence of atrial fibrillation with RVR following DCCV.  She remains on milrinone and Levophed drip with elevated CVP of 22 cm of water.     Objective:   BP 109/73   Pulse (!) 108   Temp 97.7 F (36.5 C) (Oral)   Resp 19   Ht 5' 3.5" (1.613 m)   Wt 115.7 kg   SpO2 98%   BMI 44.48 kg/m   Intake/Output Summary (Last 24 hours) at 12/03/2020 6387 Last data filed at 12/03/2020 0501 Gross per 24 hour  Intake 2332.54 ml  Output 4980 ml  Net -2647.46 ml   Weight change: -2.6 kg  Physical Exam:   General adult female in bed in no  acute distress seated in chair HEENT normocephalic atraumatic extraocular movements intact sclera anicteric Neck supple trachea midline Lungs clear but reduced to auscultation bilaterally normal work of breathing at rest on room air  Heart S1S2 no rub Abdomen soft nontender nondistended obese habitus Extremities 2+ lower extremity edema  Psych normal mood and affect Neuro - alert and  oriented x 3 provides hx and follows commands Access RIJ nontunneled dialysis catheter  Imaging: No results found.  Labs: BMET Recent Labs  Lab 11/30/20 0345 11/30/20 1610 12/01/20 0410 12/01/20 1702 12/02/20 0412 12/02/20 1526 12/03/20 0450  NA 134* 134* 133* 133* 135 134* 132*  K 4.5 4.4 4.0 4.2 3.7 3.8 4.0  CL 99 100 97* 99 100 99 99  CO2 25 26 25 25 25 25 23   GLUCOSE 119* 133* 117* 154* 103* 121* 88  BUN 22 17 16 17 14 17 15   CREATININE 1.60* 1.42* 1.88* 1.83* 1.83* 2.17* 2.04*  CALCIUM 8.9 8.8* 9.5 9.3 9.5 9.0 9.3  PHOS 2.3* 2.7 2.7 2.2* 2.3* 2.6 2.4*   CBC Recent Labs  Lab 11/30/20 0345 12/01/20 0426 12/02/20 0730 12/03/20 0450  WBC 9.6 7.9 8.6 9.3  HGB 12.6 11.2* 11.6* 11.3*  HCT 38.7 36.5 36.5 36.5  MCV 101.6* 103.1* 101.4* 103.4*  PLT 250 225 188 136*    Medications:     (feeding supplement) PROSource Plus  30 mL Oral BID BM   B-complex with vitamin C  1 tablet Oral Daily   calcitRIOL  0.25 mcg Oral Daily   Chlorhexidine Gluconate Cloth  6 each Topical Daily   clotrimazole  1 application Topical BID   estradiol  1 mg Oral Daily   febuxostat  40 mg Oral q AM   ferrous sulfate  325 mg Oral q morning   fluticasone  2 spray Each Nare Daily   fluticasone furoate-vilanterol  1 puff Inhalation Daily   gabapentin  100 mg Oral TID   insulin aspart  0-6 Units Subcutaneous TID WC   levothyroxine  88 mcg Oral q morning   lidocaine  1 patch Transdermal Q24H   midodrine  15 mg Oral TID WC   predniSONE  5 mg Oral Q breakfast   senna-docusate  2 tablet Oral BID   sodium chloride flush  10-40 mL Intracatheter Q12H   sodium chloride flush  3 mL Intravenous Q12H      [name], MD 12/03/2020,  6:53 AM

## 2020-12-03 NOTE — Progress Notes (Signed)
ANTICOAGULATION CONSULT NOTE - Follow-Up Consult  Pharmacy Consult for Heparin Indication: atrial fibrillation  Patient Measurements: Height: 5' 3.5" (161.3 cm) Weight: 115.7 kg (255 lb 1.2 oz) IBW/kg (Calculated) : 53.55 Heparin Dosing Weight: 85 kg  Vital Signs: Temp: 97.8 F (36.6 C) (08/13 0600) Temp Source: Oral (08/13 0600) BP: 109/73 (08/13 0600) Pulse Rate: 108 (08/13 0600)  Labs: Recent Labs     0000 11/30/20 1400 11/30/20 1610 12/01/20 0410 12/01/20 0426 12/01/20 1702 12/02/20 0010 12/02/20 0412 12/02/20 0730 12/02/20 1526 12/03/20 0450  HGB   < >  --   --   --  11.2*  --   --   --  11.6*  --  11.3*  HCT  --   --   --   --  36.5  --   --   --  36.5  --  36.5  PLT  --   --   --   --  225  --   --   --  188  --  136*  APTT  --  95*  --  81*  --   --   --   --   --   --   --   HEPARINUNFRC  --  0.69  --  0.51  --   --  0.82*  --   --   --  0.60  CREATININE  --   --    < > 1.88*  --    < >  --  1.83*  --  2.17* 2.04*   < > = values in this interval not displayed.     Estimated Creatinine Clearance: 34.5 mL/min (A) (by C-G formula based on SCr of 2.04 mg/dL (H)).   Medical History: Past Medical History:  Diagnosis Date   A-fib ([place])    Cardiomyopathy    Hyperlipidemia    Hypertension    Lupus ([place])    Pulmonary hypertension ([place]) 05/10/2011   Echo, EF-40-45   Renal disorder    Sleep apnea 2008   CPAP, pt does not know settings   Thyroid disease     Medications:     Assessment: 65 y.o. female with  h/o Afib, DOAC on hold, for heparin.  Pt was on Xarelto PTA and changed to Eliquis due to procedures.  Eliquis 5 mg last given at 2317 7/31.   CRRT filter having repeated clotting episodes. Discussed with nephrology who would prefer to increase systemic heparin to higher goal range rather than start circuit heparin. For now, increase to 1450 units/hr.  Heparin level 0.6 this morning on 1400 unit/h. CRRT is currently clotted. May need to add low-fixed  dose circuit heparin tomorrow if this does not help.  Goal of Therapy:  Heparin level 0.5-0.7 units/ml Monitor platelets by anticoagulation protocol: Yes   Plan:  Increase heparin to 1450 units/hr Heparin level with AM labs  Cathrine Muster, PharmD PGY2 Cardiology Pharmacy Resident Phone: [phone] 12/03/2020  6:46 AM  Please check [email or web address] for unit-specific pharmacy phone numbers.

## 2020-12-04 DIAGNOSIS — I4891 Unspecified atrial fibrillation: Secondary | ICD-10-CM | POA: Diagnosis not present

## 2020-12-04 DIAGNOSIS — R570 Cardiogenic shock: Secondary | ICD-10-CM | POA: Diagnosis not present

## 2020-12-04 DIAGNOSIS — N186 End stage renal disease: Secondary | ICD-10-CM | POA: Diagnosis not present

## 2020-12-04 LAB — BASIC METABOLIC PANEL
Anion gap: 14 (ref 5–15)
BUN: 29 mg/dL — ABNORMAL HIGH (ref 8–23)
CO2: 22 mmol/L (ref 22–32)
Calcium: 10 mg/dL (ref 8.9–10.3)
Chloride: 96 mmol/L — ABNORMAL LOW (ref 98–111)
Creatinine, Ser: 3.76 mg/dL — ABNORMAL HIGH (ref 0.44–1.00)
GFR, Estimated: 13 mL/min — ABNORMAL LOW (ref >60)
Glucose, Bld: 100 mg/dL — ABNORMAL HIGH (ref 70–99)
Potassium: 3.9 mmol/L (ref 3.5–5.1)
Sodium: 132 mmol/L — ABNORMAL LOW (ref 135–145)

## 2020-12-04 LAB — COOXEMETRY PANEL
Carboxyhemoglobin: 1.8 % — ABNORMAL HIGH (ref 0.5–1.5)
Carboxyhemoglobin: 1.6 % — ABNORMAL HIGH (ref 0.5–1.5)
Methemoglobin: 0.8 % (ref 0.0–1.5)
Methemoglobin: 0.9 % (ref 0.0–1.5)
O2 Saturation: 81.7 %
O2 Saturation: 63 %
Total hemoglobin: 10.7 g/dL — ABNORMAL LOW (ref 12.0–16.0)
Total hemoglobin: 10.4 g/dL — ABNORMAL LOW (ref 12.0–16.0)

## 2020-12-04 LAB — RENAL FUNCTION PANEL
Albumin: 2.9 g/dL — ABNORMAL LOW (ref 3.5–5.0)
Anion gap: 14 (ref 5–15)
BUN: 29 mg/dL — ABNORMAL HIGH (ref 8–23)
CO2: 22 mmol/L (ref 22–32)
Calcium: 10 mg/dL (ref 8.9–10.3)
Chloride: 96 mmol/L — ABNORMAL LOW (ref 98–111)
Creatinine, Ser: 3.79 mg/dL — ABNORMAL HIGH (ref 0.44–1.00)
GFR, Estimated: 13 mL/min — ABNORMAL LOW (ref >60)
Glucose, Bld: 101 mg/dL — ABNORMAL HIGH (ref 70–99)
Phosphorus: 4.1 mg/dL (ref 2.5–4.6)
Potassium: 3.9 mmol/L (ref 3.5–5.1)
Sodium: 132 mmol/L — ABNORMAL LOW (ref 135–145)

## 2020-12-04 LAB — CBC
HCT: 33.1 % — ABNORMAL LOW (ref 36.0–46.0)
Hemoglobin: 10.5 g/dL — ABNORMAL LOW (ref 12.0–15.0)
MCH: 32.5 pg (ref 26.0–34.0)
MCHC: 31.7 g/dL (ref 30.0–36.0)
MCV: 102.5 fL — ABNORMAL HIGH (ref 80.0–100.0)
Platelets: 132 10*3/uL — ABNORMAL LOW (ref 150–400)
RBC: 3.23 MIL/uL — ABNORMAL LOW (ref 3.87–5.11)
RDW: 14.1 % (ref 11.5–15.5)
WBC: 11.9 10*3/uL — ABNORMAL HIGH (ref 4.0–10.5)
nRBC: 0.2 % (ref 0.0–0.2)

## 2020-12-04 LAB — FERRITIN: Ferritin: 134 ng/mL (ref 11–307)

## 2020-12-04 LAB — GLUCOSE, CAPILLARY
Glucose-Capillary: 112 mg/dL — ABNORMAL HIGH (ref 70–99)
Glucose-Capillary: 148 mg/dL — ABNORMAL HIGH (ref 70–99)
Glucose-Capillary: 132 mg/dL — ABNORMAL HIGH (ref 70–99)
Glucose-Capillary: 120 mg/dL — ABNORMAL HIGH (ref 70–99)

## 2020-12-04 LAB — IRON AND TIBC
Iron: 272 ug/dL — ABNORMAL HIGH (ref 28–170)
Saturation Ratios: 72 % — ABNORMAL HIGH (ref 10.4–31.8)
TIBC: 379 ug/dL (ref 250–450)
UIBC: 107 ug/dL

## 2020-12-04 LAB — HEPARIN LEVEL (UNFRACTIONATED): Heparin Unfractionated: 0.66 IU/mL (ref 0.30–0.70)

## 2020-12-04 LAB — MAGNESIUM: Magnesium: 2.3 mg/dL (ref 1.7–2.4)

## 2020-12-04 MED ORDER — FUROSEMIDE 10 MG/ML IJ SOLN
80.0000 mg | Freq: Once | INTRAMUSCULAR | Status: AC
  Administered 2020-12-04: 80 mg via INTRAVENOUS
  Filled 2020-12-04: qty 8

## 2020-12-04 NOTE — Progress Notes (Signed)
Patient ID: Caitlyn Fuentes, female   DOB: [birth year], 65 y.o.   MRN: [id number] [facility] Progress Note    Assessment/ Plan:    1. Acute kidney Injury on chronic kidney disease stage IV: Cardiorenal.  Nonoliguric and ongoing furosemide augmented earlier with the addition of metolazone.  Baseline Cr likely around 2 per charting but no outpatient labs available previously.  CRRT started on 8/8 after nontunneled catheter with CHF team for volume management and clearance. Clotted and CRRT stopped on 8/13 - Assess HD needs daily and monitor for recovery of renal function; nonoliguric prior to CRRT.  Tentative HD on 8/16 likely   - lasix 80 mg IV once today   2.  Acute exacerbation of congestive heart failure: On milrinone for inotropic support. S/p norepinephrine.  Optimize volume as above   3.  Hypokalemia: Secondary to urinary losses; improved and doing well on 4K  4.  Atrial fibrillation with rapid ventricular response: RVR with decreased cardiac output likely contributing to renal hypoperfusion/dysfunction.  Has converted.  Per CHF team  5.  Hyponatremia: Secondary to acute exacerbation of congestive heart failure/worsening renal function and impaired free water handling.  Improved with CRRT  6. Hypophosphatemia - replete PRN. Secondary to CRRT  7. Anemia macrocytic - check iron panel for now  Disposition - continue ICU monitoring   Subjective:   patient had no uop over 8/13.  She had 1.8 kg UF over 8/13 with CRRT and we stopped midday due to clotting.  She has been on milrinone.  CVP 6-8 mmHg overnight.   Review of systems   She reports shortness of breath is overall much better; some shortness of breath with exertion  Denies n/v Denies chest pain Was able to walk around a bit   Background on consult:  65 year old African-American woman with past medical history significant for chronic congestive heart failure with reduced ejection fraction (EF 45%), nonischemic  cardiomyopathy, hypothyroidism, hypertension, dyslipidemia, obstructive sleep apnea, obesity, systemic lupus erythematosus and chronic kidney disease stage IV at baseline (baseline creatinine apparently around 2.0) who presented to the hospital with worsening shortness of breath, lower extremity edema, palpitations and dyspnea on exertion.  She was found to have volume overload with pulmonary edema along with atrial patient with RVR and was admitted to the ICU for cardiogenic shock.  Nephrology was originally consulted for management of her oliguric acute kidney injury secondary to cardiogenic shock/cardiorenal syndrome and appears to have been sluggishly improving at the time of sign off (creatinine down to 3.5 from 4.2) with decent diuresis.  Unfortunately, she remained on Lasix drip with suboptimal urine output and worsening renal function (BUN 96/creatinine 3.9) likely in part from recurrence of atrial fibrillation with RVR following DCCV.  She remains on milrinone and Levophed drip with elevated CVP of 22 cm of water.     Objective:   BP (!) 98/46 (BP Location: Right Wrist)   Pulse (!) 112   Temp 98.4 F (36.9 C) (Oral)   Resp (!) 27   Ht 5' 3.5" (1.613 m)   Wt 117.6 kg   SpO2 93%   BMI 45.21 kg/m   Intake/Output Summary (Last 24 hours) at 12/04/2020 0646 Last data filed at 12/04/2020 0100 Gross per 24 hour  Intake 1905.13 ml  Output 1813 ml  Net 92.13 ml   Weight change: 1.9 kg  Physical Exam:   General adult female in bed in no acute distress seated in chair HEENT normocephalic atraumatic extraocular movements intact sclera anicteric  Neck supple trachea midline Lungs clear but reduced to auscultation bilaterally normal work of breathing at rest on 1 liter  Heart S1S2 no rub Abdomen soft nontender nondistended obese habitus Extremities 1-2+ lower extremity edema  Psych normal mood and affect Neuro - alert and oriented x 3 provides hx and follows commands Access RIJ nontunneled  dialysis catheter  Imaging: No results found.  Labs: BMET Recent Labs  Lab 11/30/20 1610 12/01/20 0410 12/01/20 1702 12/02/20 0412 12/02/20 1526 12/03/20 0450 12/04/20 0407  NA 134* 133* 133* 135 134* 132* 132*  132*  K 4.4 4.0 4.2 3.7 3.8 4.0 3.9  3.9  CL 100 97* 99 100 99 99 96*  96*  CO2 26 25 25 25 25 23 22  22   GLUCOSE 133* 117* 154* 103* 121* 88 100*  101*  BUN 17 16 17 14 17 15  29*  29*  CREATININE 1.42* 1.88* 1.83* 1.83* 2.17* 2.04* 3.76*  3.79*  CALCIUM 8.8* 9.5 9.3 9.5 9.0 9.3 10.0  10.0  PHOS 2.7 2.7 2.2* 2.3* 2.6 2.4* 4.1   CBC Recent Labs  Lab 12/01/20 0426 12/02/20 0730 12/03/20 0450 12/04/20 0407  WBC 7.9 8.6 9.3 11.9*  HGB 11.2* 11.6* 11.3* 10.5*  HCT 36.5 36.5 36.5 33.1*  MCV 103.1* 101.4* 103.4* 102.5*  PLT 225 188 136* 132*    Medications:     (feeding supplement) PROSource Plus  30 mL Oral BID BM   B-complex with vitamin C  1 tablet Oral Daily   calcitRIOL  0.25 mcg Oral Daily   Chlorhexidine Gluconate Cloth  6 each Topical Daily   clotrimazole  1 application Topical BID   estradiol  1 mg Oral Daily   febuxostat  40 mg Oral q AM   ferrous sulfate  325 mg Oral q morning   fluticasone  2 spray Each Nare Daily   fluticasone furoate-vilanterol  1 puff Inhalation Daily   gabapentin  100 mg Oral TID   insulin aspart  0-6 Units Subcutaneous TID WC   levothyroxine  88 mcg Oral q morning   lidocaine  1 patch Transdermal Q24H   midodrine  15 mg Oral TID WC   predniSONE  5 mg Oral Q breakfast   senna-docusate  2 tablet Oral BID   sodium chloride flush  10-40 mL Intracatheter Q12H   sodium chloride flush  3 mL Intravenous Q12H      [name], MD 12/04/2020,  7:07 AM

## 2020-12-04 NOTE — Progress Notes (Signed)
ANTICOAGULATION CONSULT NOTE - Follow-Up Consult  Pharmacy Consult for Heparin Indication: atrial fibrillation  Patient Measurements: Height: 5' 3.5" (161.3 cm) Weight: 117.6 kg (259 lb 4.2 oz) IBW/kg (Calculated) : 53.55 Heparin Dosing Weight: 85 kg  Vital Signs: Temp: 98.4 F (36.9 C) (08/14 0355) Temp Source: Oral (08/14 0355) BP: 101/58 (08/14 0600) Pulse Rate: 112 (08/14 0600)  Labs: Recent Labs    12/02/20 0010 12/02/20 0412 12/02/20 0730 12/02/20 1526 12/03/20 0450 12/04/20 0407  HGB  --    < > 11.6*  --  11.3* 10.5*  HCT  --   --  36.5  --  36.5 33.1*  PLT  --   --  188  --  136* 132*  HEPARINUNFRC 0.82*  --   --   --  0.60 0.66  CREATININE  --    < >  --  2.17* 2.04* 3.76*  3.79*   < > = values in this interval not displayed.     Estimated Creatinine Clearance: 18.9 mL/min (A) (by C-G formula based on SCr of 3.76 mg/dL (H)).   Medical History: Past Medical History:  Diagnosis Date   A-fib ([place])    Cardiomyopathy    Hyperlipidemia    Hypertension    Lupus ([place])    Pulmonary hypertension ([place]) 05/10/2011   Echo, EF-40-45   Renal disorder    Sleep apnea 2008   CPAP, pt does not know settings   Thyroid disease     Medications:     Assessment: 65 y.o. female with  h/o Afib, DOAC on hold, for heparin.  Pt was on Xarelto PTA and changed to Eliquis due to procedures.  Eliquis 5 mg last given at 2317 7/31.   CRRT filter clotted, so CRRT will be stopped. ow, increase to 1450 units/hr.  Heparin level therapeutic at 0.66 this morning on 1450 unit/h. H&H and plts stable.  Goal of Therapy:  Heparin level 0.5-0.7 units/ml Monitor platelets by anticoagulation protocol: Yes   Plan:  Continue heparin at 1450 units/hr Heparin level with AM labs Follow-up transition back to [facility] now that CRRT is complete  Cathrine Muster, PharmD PGY2 Cardiology Pharmacy Resident Phone: [phone] 12/04/2020  7:17 AM  Please check [email or web address] for unit-specific  pharmacy phone numbers.

## 2020-12-04 NOTE — Progress Notes (Addendum)
Advanced Heart Failure Rounding Note  PCP-Cardiologist: Shelva Majestic, MD  HF clinic: Dr. Haroldine Laws  Subjective:    Events: - 8/1 A fib RVR & A/C systolic heart faiure --> cardiogenic shock. Had urgent DC-CV with brief conversion to NSR but went back in A fib. . On Norepi + milrinone. AKI. Nephrology consulted.  - 8/2 A fib RVR, amio gtt. Continued on milrinone 0.375 mcg + norepi 2 mcg. Started on prednisone 40 mg daily x3 days for gout flare.  - 8/3 did not tolerate milrinone wean w/ drop in Co-ox 68%--->48% and decrease in UOP. Milrinone increased back to 0.375.  Lasix gtt increased to 30/hr.  - 8/4 NE added back given persistently low Co-ox - 8/4 s/p TEE/DCCV>>NSR  - 8/5 back in Afib w/ RVR  -  08/09 HD cath placed, CRRT started - 8/10: conversion to sinus tachycardia  - 8/13 CRRT stopped  CRRT filter clotted yesterday. Now off. Received lasix 80IV x 1 per Renal this am.   Feels ok. Denies SOB, orthopnea or PND. NE off. Remains on milrinone 0.375. co-ox 82% CVP 7-8  Remains in sinus tach  Objective:   Weight Range: 117.6 kg Body mass index is 45.21 kg/m.   Vital Signs:   Temp:  [97.5 F (36.4 C)-99.8 F (37.7 C)] 99.8 F (37.7 C) (08/14 0700) Pulse Rate:  [98-130] 121 (08/14 0915) Resp:  [16-44] 21 (08/14 0915) BP: (84-136)/(40-103) 86/59 (08/14 0915) SpO2:  [87 %-100 %] 92 % (08/14 0915) Weight:  [117.6 kg] 117.6 kg (08/14 0500) Last BM Date: 12/03/20  Weight change: Filed Weights   12/02/20 0500 12/03/20 0600 12/04/20 0500  Weight: 118.3 kg 115.7 kg 117.6 kg    Intake/Output:   Intake/Output Summary (Last 24 hours) at 12/04/2020 1012 Last data filed at 12/04/2020 0900 Gross per 24 hour  Intake 1442.7 ml  Output 1242 ml  Net 200.7 ml      Physical Exam   General:  Sitting in chair No resp difficulty HEENT: normal Neck: supple. RIJ HD cath Carotids 2+ bilat; no bruits. No lymphadenopathy or thryomegaly appreciated. Cor: PMI nondisplaced. Regular  tachy No rubs, gallops or murmurs. Lungs: clear Abdomen: soft, nontender, nondistended. No hepatosplenomegaly. No bruits or masses. Good bowel sounds. Extremities: no cyanosis, clubbing, rash, trace edema + UNNA Neuro: alert & orientedx3, cranial nerves grossly intact. moves all 4 extremities w/o difficulty. Affect pleasant   Telemetry   Sinus tach 110-120 Personally reviewed  Labs    CBC Recent Labs    12/03/20 0450 12/04/20 0407  WBC 9.3 11.9*  HGB 11.3* 10.5*  HCT 36.5 33.1*  MCV 103.4* 102.5*  PLT 136* 132*    Basic Metabolic Panel Recent Labs    12/03/20 0450 12/04/20 0407  NA 132* 132*  132*  K 4.0 3.9  3.9  CL 99 96*  96*  CO2 23 22  22   GLUCOSE 88 100*  101*  BUN 15 29*  29*  CREATININE 2.04* 3.76*  3.79*  CALCIUM 9.3 10.0  10.0  MG 2.4 2.3  PHOS 2.4* 4.1    Liver Function Tests Recent Labs    12/03/20 0450 12/04/20 0407  ALBUMIN 3.2* 2.9*    No results for input(s): LIPASE, AMYLASE in the last 72 hours. Cardiac Enzymes No results for input(s): CKTOTAL, CKMB, CKMBINDEX, TROPONINI in the last 72 hours.  BNP: BNP (last 3 results) Recent Labs    11/20/20 0318 11/21/20 0258  BNP 2,051.3* 2,022.4*     ProBNP (  last 3 results) No results for input(s): PROBNP in the last 8760 hours.   D-Dimer No results for input(s): DDIMER in the last 72 hours. Hemoglobin A1C No results for input(s): HGBA1C in the last 72 hours.  Fasting Lipid Panel No results for input(s): CHOL, HDL, LDLCALC, TRIG, CHOLHDL, LDLDIRECT in the last 72 hours. Thyroid Function Tests No results for input(s): TSH, T4TOTAL, T3FREE, THYROIDAB in the last 72 hours.  Invalid input(s): FREET3   Other results:   Imaging    No results found.   Medications:     Scheduled Medications:  (feeding supplement) PROSource Plus  30 mL Oral BID BM   B-complex with vitamin C  1 tablet Oral Daily   calcitRIOL  0.25 mcg Oral Daily   Chlorhexidine Gluconate Cloth  6 each  Topical Daily   clotrimazole  1 application Topical BID   estradiol  1 mg Oral Daily   febuxostat  40 mg Oral q AM   ferrous sulfate  325 mg Oral q morning   fluticasone  2 spray Each Nare Daily   fluticasone furoate-vilanterol  1 puff Inhalation Daily   gabapentin  100 mg Oral TID   insulin aspart  0-6 Units Subcutaneous TID WC   levothyroxine  88 mcg Oral q morning   lidocaine  1 patch Transdermal Q24H   midodrine  15 mg Oral TID WC   predniSONE  5 mg Oral Q breakfast   senna-docusate  2 tablet Oral BID   sodium chloride flush  10-40 mL Intracatheter Q12H   sodium chloride flush  3 mL Intravenous Q12H    Infusions:  sodium chloride     amiodarone 30 mg/hr (12/04/20 0900)   heparin 1,450 Units/hr (12/04/20 0900)   milrinone 0.375 mcg/kg/min (12/04/20 0900)   norepinephrine (LEVOPHED) Adult infusion Stopped (12/02/20 0751)    PRN Medications: sodium chloride, albuterol, cyclobenzaprine, HYDROcodone-acetaminophen, hydrocortisone cream, ondansetron (ZOFRAN) IV, oxyCODONE, sodium chloride flush, sodium chloride flush    Patient Profile   Caitlyn Fuentes is a 65 year old with a history of chronic systolic hf previously EF 45%,  NICM, hypothyroid, PAF, gout, OSA, CKD Stage IV, HTN, hyperlipidemia, and lupus.   Admitted with A fib RVR and A/C systolic heart failure complicated by cardiogenic shock.     Assessment/Plan    1. Acute/Chronic Systolic Heart Failure -> Cardiogenic shock in setting AF with RVR - 8/1 Lactic acid 1.5. CO-OX 43%.--> started on milrinone and NE - Echo 7/22 EF 30-35% (2021)  This admit -> 25-30%. - NE stopped 8/12 Remains on milrinone 0.375. Co-ox 56% -> 82% (watch for impending sepsis). Will repeat. Can cut milrinone back if remains high.  - Volume status much improved with CVVHD. CVP 7-8 . CRRT stopped 8/13. Received IV lasix this am per Renal. Assess for need for iHD - Unable to tolerate GDMT due to hypotension and AKI - Not candidate for advanced  therapies with size and ESRD if unable to tolerate iHD off pressors would require Hospice evaluation   2. PAF--> Afib RVR - Developed shock and had urgent cardioversion 8/1 with conversion to NSR w/ ERAF - Repeat DCCV 8/4 w/ ERAF - Conversion to sinus tachycardia on 8/10 with IV amio - Maintaining sinus. Continue amiodarone at 30 mg/hr while on inotropes  - unfortunately not a candidate for ablation due to size if AF were to recur. Not candidate for AVN ablation and CRT. - Continue heparin gtt as patient will likely need tunneled HD cath soon  3. AKI on CKD Stage IV -> ESRD - Baseline SCr ~3.2  - Renal US with no acute findings - Off CRRT on 8/13. Await to see if she has  Renal recovery. If no renal recovery, attempt iHD if BP can handle. If not, will need to consider Hospice.  - Will keep in ICU while we are determining if she can tolerate iHD. Restart NE as needed to keep MAP >= 70 to preserve renal perfusion   4. OSA - Uses CPAP, but refused overnight    5. Hypothyroidism -TSH 8. T3 ok and T4 2.3  -On synthroid. Dose increased to 88 mcg -Repeat TSH in 6 weeks.    6. Gout - Uric acid 10. Pain RLE ? Flare  - On uloric 40 mg daily  - Completed 3 day course of prednisone    7. Hypokalemia -- now resolved - K 3.9 today  8. Deconditioning - Continue PT/OT.  - needs HHPT. ? SNF  9. Hypervolemic hyponatremia -- improving - Na 132 today - limit free H2O  10. Lupus - on low-dose prednisone.  CRITICAL CARE Performed by: Glori Bickers  Total critical care time: 35 minutes  Critical care time was exclusive of separately billable procedures and treating other patients.  Critical care was necessary to treat or prevent imminent or life-threatening deterioration.  Critical care was time spent personally by me (independent of midlevel providers or residents) on the following activities: development of treatment plan with patient and/or surrogate as well as nursing,  discussions with consultants, evaluation of patient's response to treatment, examination of patient, obtaining history from patient or surrogate, ordering and performing treatments and interventions, ordering and review of laboratory studies, ordering and review of radiographic studies, pulse oximetry and re-evaluation of patient's condition.  Length of Stay: Bowman, MD  12/04/2020, 10:12 AM  Advanced Heart Failure Team Pager [phone] (M-F; 7a - 5p)  Please contact [facility] for night-coverage after hours (5p -7a ) and weekends on [email or web address]

## 2020-12-05 DIAGNOSIS — I4891 Unspecified atrial fibrillation: Secondary | ICD-10-CM | POA: Diagnosis not present

## 2020-12-05 DIAGNOSIS — R570 Cardiogenic shock: Secondary | ICD-10-CM | POA: Diagnosis not present

## 2020-12-05 LAB — BASIC METABOLIC PANEL
Anion gap: 12 (ref 5–15)
BUN: 53 mg/dL — ABNORMAL HIGH (ref 8–23)
CO2: 21 mmol/L — ABNORMAL LOW (ref 22–32)
Calcium: 9.6 mg/dL (ref 8.9–10.3)
Chloride: 97 mmol/L — ABNORMAL LOW (ref 98–111)
Creatinine, Ser: 5.84 mg/dL — ABNORMAL HIGH (ref 0.44–1.00)
GFR, Estimated: 8 mL/min — ABNORMAL LOW (ref >60)
Glucose, Bld: 102 mg/dL — ABNORMAL HIGH (ref 70–99)
Potassium: 4.3 mmol/L (ref 3.5–5.1)
Sodium: 130 mmol/L — ABNORMAL LOW (ref 135–145)

## 2020-12-05 LAB — CBC
HCT: 30.5 % — ABNORMAL LOW (ref 36.0–46.0)
Hemoglobin: 9.7 g/dL — ABNORMAL LOW (ref 12.0–15.0)
MCH: 32.7 pg (ref 26.0–34.0)
MCHC: 31.8 g/dL (ref 30.0–36.0)
MCV: 102.7 fL — ABNORMAL HIGH (ref 80.0–100.0)
Platelets: 121 10*3/uL — ABNORMAL LOW (ref 150–400)
RBC: 2.97 MIL/uL — ABNORMAL LOW (ref 3.87–5.11)
RDW: 14.2 % (ref 11.5–15.5)
WBC: 12.8 10*3/uL — ABNORMAL HIGH (ref 4.0–10.5)
nRBC: 0.2 % (ref 0.0–0.2)

## 2020-12-05 LAB — COOXEMETRY PANEL
Carboxyhemoglobin: 1.7 % — ABNORMAL HIGH (ref 0.5–1.5)
Methemoglobin: 0.8 % (ref 0.0–1.5)
O2 Saturation: 72.5 %
Total hemoglobin: 10.3 g/dL — ABNORMAL LOW (ref 12.0–16.0)

## 2020-12-05 LAB — GLUCOSE, CAPILLARY
Glucose-Capillary: 105 mg/dL — ABNORMAL HIGH (ref 70–99)
Glucose-Capillary: 151 mg/dL — ABNORMAL HIGH (ref 70–99)
Glucose-Capillary: 117 mg/dL — ABNORMAL HIGH (ref 70–99)
Glucose-Capillary: 128 mg/dL — ABNORMAL HIGH (ref 70–99)

## 2020-12-05 LAB — HEPARIN LEVEL (UNFRACTIONATED): Heparin Unfractionated: 0.51 IU/mL (ref 0.30–0.70)

## 2020-12-05 LAB — MAGNESIUM: Magnesium: 2.2 mg/dL (ref 1.7–2.4)

## 2020-12-05 MED ORDER — HEPARIN SODIUM (PORCINE) 1000 UNIT/ML IJ SOLN
INTRAMUSCULAR | Status: AC
  Filled 2020-12-05: qty 4

## 2020-12-05 MED ORDER — FUROSEMIDE 10 MG/ML IJ SOLN
160.0000 mg | Freq: Once | INTRAVENOUS | Status: AC
  Administered 2020-12-05: 160 mg via INTRAVENOUS
  Filled 2020-12-05: qty 4

## 2020-12-05 MED ORDER — AMIODARONE IV BOLUS ONLY 150 MG/100ML
150.0000 mg | Freq: Once | INTRAVENOUS | Status: AC
  Administered 2020-12-05: 150 mg via INTRAVENOUS

## 2020-12-05 NOTE — Progress Notes (Signed)
Patient stated that she didn't want to wear her home CPAP unit tonight.  RT applied [place] on 2 L at patient request. RN made aware.

## 2020-12-05 NOTE — Progress Notes (Signed)
Advanced Heart Failure Rounding Note  PCP-Cardiologist: Shelva Majestic, MD  HF clinic: Dr. Haroldine Laws  Subjective:    Events: - 8/1 A fib RVR & A/C systolic heart faiure --> cardiogenic shock. Had urgent DC-CV with brief conversion to NSR but went back in A fib. . On Norepi + milrinone. AKI. Nephrology consulted.  - 8/2 A fib RVR, amio gtt. Continued on milrinone 0.375 mcg + norepi 2 mcg. Started on prednisone 40 mg daily x3 days for gout flare.  - 8/3 did not tolerate milrinone wean w/ drop in Co-ox 68%--->48% and decrease in UOP. Milrinone increased back to 0.375.  Lasix gtt increased to 30/hr.  - 8/4 NE added back given persistently low Co-ox - 8/4 s/p TEE/DCCV>>NSR  - 8/5 back in Afib w/ RVR  -  08/09 HD cath placed, CRRT started - 8/10: conversion to sinus tachycardia  - 8/13 CRRT stopped  Remains on milrinone at 0.375 and amio at 30.  Norepinephrine weaned off this morning.  Co-ox of 72.5%, CVP 11-12 (personally measured).  Received 80 mg of IV Lasix on 8/14 with minimal response -- only 100 mL of urine output charted.  Cr of 5.84 today (3.76 yesterday).   She is sitting up in a chair eating breakfast in no acute distress and is attempting to process the possibility that hospice is her only option.  She reports upper extremity tremors with no associated numbness or tingling as well as wheezing earlier this morning.   Objective:   Weight Range: 116.7 kg Body mass index is 44.86 kg/m.   Vital Signs:   Temp:  [98.2 F (36.8 C)-98.7 F (37.1 C)] 98.3 F (36.8 C) (08/15 0330) Pulse Rate:  [104-130] 120 (08/15 0600) Resp:  [14-38] 22 (08/15 0600) BP: (84-130)/(33-102) 120/71 (08/15 0600) SpO2:  [87 %-99 %] 91 % (08/15 0600) Weight:  [116.7 kg] 116.7 kg (08/15 0500) Last BM Date: 12/03/20  Weight change: Filed Weights   12/03/20 0600 12/04/20 0500 12/05/20 0500  Weight: 115.7 kg 117.6 kg 116.7 kg    Intake/Output:   Intake/Output Summary (Last 24 hours) at  12/05/2020 0728 Last data filed at 12/05/2020 0600 Gross per 24 hour  Intake 1785.91 ml  Output 100 ml  Net 1685.91 ml      Physical Exam   General:  Sitting in chair. No resp difficulty HEENT: normal Neck: supple. RIJ HD cath. Carotids 2+ bilat; no bruits. No lymphadenopathy or thryomegaly appreciated. Cor: PMI nondisplaced. Regular, tachy. No rubs, gallops or murmurs. Lungs: clear Abdomen: soft, nontender, nondistended. No hepatosplenomegaly. No bruits or masses. Good bowel sounds. Extremities: no cyanosis, clubbing, rash, trace edema.  Tremors noted in upper extremities. Neuro: alert & orientedx3, cranial nerves grossly intact. moves all 4 extremities w/o difficulty. Affect pleasant   Telemetry   Sinus tach 110-120. Personally reviewed  Labs    CBC Recent Labs    12/04/20 0407 12/05/20 0403  WBC 11.9* 12.8*  HGB 10.5* 9.7*  HCT 33.1* 30.5*  MCV 102.5* 102.7*  PLT 132* 121*    Basic Metabolic Panel Recent Labs    12/03/20 0450 12/04/20 0407 12/05/20 0403  NA 132* 132*  132* 130*  K 4.0 3.9  3.9 4.3  CL 99 96*  96* 97*  CO2 23 22  22  21*  GLUCOSE 88 100*  101* 102*  BUN 15 29*  29* 53*  CREATININE 2.04* 3.76*  3.79* 5.84*  CALCIUM 9.3 10.0  10.0 9.6  MG 2.4 2.3 2.2  PHOS  2.4* 4.1  --     Liver Function Tests Recent Labs    12/03/20 0450 12/04/20 0407  ALBUMIN 3.2* 2.9*    No results for input(s): LIPASE, AMYLASE in the last 72 hours. Cardiac Enzymes No results for input(s): CKTOTAL, CKMB, CKMBINDEX, TROPONINI in the last 72 hours.  BNP: BNP (last 3 results) Recent Labs    11/20/20 0318 11/21/20 0258  BNP 2,051.3* 2,022.4*     ProBNP (last 3 results) No results for input(s): PROBNP in the last 8760 hours.   D-Dimer No results for input(s): DDIMER in the last 72 hours. Hemoglobin A1C No results for input(s): HGBA1C in the last 72 hours.  Fasting Lipid Panel No results for input(s): CHOL, HDL, LDLCALC, TRIG, CHOLHDL,  LDLDIRECT in the last 72 hours. Thyroid Function Tests No results for input(s): TSH, T4TOTAL, T3FREE, THYROIDAB in the last 72 hours.  Invalid input(s): FREET3   Other results:   Imaging    No results found.   Medications:     Scheduled Medications:  (feeding supplement) PROSource Plus  30 mL Oral BID BM   B-complex with vitamin C  1 tablet Oral Daily   calcitRIOL  0.25 mcg Oral Daily   Chlorhexidine Gluconate Cloth  6 each Topical Daily   clotrimazole  1 application Topical BID   estradiol  1 mg Oral Daily   febuxostat  40 mg Oral q AM   ferrous sulfate  325 mg Oral q morning   fluticasone  2 spray Each Nare Daily   fluticasone furoate-vilanterol  1 puff Inhalation Daily   gabapentin  100 mg Oral TID   insulin aspart  0-6 Units Subcutaneous TID WC   levothyroxine  88 mcg Oral q morning   lidocaine  1 patch Transdermal Q24H   midodrine  15 mg Oral TID WC   predniSONE  5 mg Oral Q breakfast   senna-docusate  2 tablet Oral BID   sodium chloride flush  10-40 mL Intracatheter Q12H   sodium chloride flush  3 mL Intravenous Q12H    Infusions:  sodium chloride     amiodarone 30 mg/hr (12/05/20 0600)   heparin 1,450 Units/hr (12/05/20 0600)   milrinone 0.375 mcg/kg/min (12/05/20 0722)   norepinephrine (LEVOPHED) Adult infusion Stopped (12/05/20 0519)    PRN Medications: sodium chloride, albuterol, cyclobenzaprine, HYDROcodone-acetaminophen, hydrocortisone cream, ondansetron (ZOFRAN) IV, oxyCODONE, sodium chloride flush, sodium chloride flush    Patient Profile   Caitlyn Fuentes is a 65 year old with a history of chronic systolic hf previously EF 45%,  NICM, hypothyroid, PAF, gout, OSA, CKD Stage IV, HTN, hyperlipidemia, and lupus.   Admitted with A fib RVR and A/C systolic heart failure complicated by cardiogenic shock.     Assessment/Plan    1. Acute/Chronic Systolic Heart Failure -> Cardiogenic shock in setting AF with RVR - 8/1 Lactic acid 1.5. CO-OX 43%.-->  started on milrinone and NE - Echo 7/22 EF 30-35% (2021)  This admit -> 25-30%. - NE stopped 8/12, then resumed briefly the morning of 8/15 -- later weaned off.  Remains on milrinone 0.375. Co-ox 56% -> 82% -> 63% -> 72.5% today. Can decrease milrinone today. - Volume status much improved after CVVHD -- stopped 8/13.  Received a dose of IV Lasix on 8/14 with minimal response (100 mL of urine).  Per nephrology, HD tomorrow - Unable to tolerate GDMT due to hypotension and AKI - Not candidate for advanced therapies with size and ESRD if unable to tolerate iHD off  pressors would require Hospice evaluation - Resume NE as needed to maintain MAP of at least 70  - Palliative care consult    2. PAF--> Afib RVR - Developed shock and had urgent cardioversion 8/1 with conversion to NSR w/ ERAF - Repeat DCCV 8/4 w/ ERAF - Conversion to sinus tachycardia on 8/10 with IV amio - Maintaining sinus. Continue amiodarone at 30 mg/hr while on inotropes  - unfortunately, not a candidate for ablation due to size if AF were to recur. Not candidate for AVN ablation and CRT. - Continue heparin gtt as patient will likely need additional HD access soon, but monitoring plt count with HIT pending   3. AKI on CKD Stage IV -> ESRD - Baseline SCr ~3.2, now up to 5.84  - Renal US with no acute findings - Off CRRT on 8/13.  Per nephrology, iHD on 8/16 -- will evaluate BP tolerated. If not, will need hospice consideration.  - Will keep in ICU while we are determining if she can tolerate iHD. Restart NE as needed to keep MAP >= 70 to preserve renal perfusion   4. OSA - Uses CPAP, but refused overnight    5. Hypothyroidism -TSH 8. T3 ok and T4 2.3  -On synthroid. Dose increased to 88 mcg -Repeat TSH in 6 weeks.    6. Gout - Uric acid 10. Pain RLE ? Flare  - On uloric 40 mg daily  - Completed 3 day course of prednisone    7. Hypokalemia -- now resolved - K 3.9 today  8. Deconditioning - Continue PT/OT - needs  HHPT. ? SNF   9. Hypervolemic hyponatremia -- improving - Na 130 today - limit free H2O -- 1200 mL fluid restriction ordered   10. Lupus - on low-dose prednisone.  11. Thrombocytopenia - Plt count of 121 -- has steadily dropped since 8/11  - Will send HIT panel  - No active bleeding noted    Caitlyn Gasman, NP  12/05/2020, 7:28 AM  Advanced Heart Failure Team Pager [phone] (M-F; 7a - 5p)  Please contact [facility] for night-coverage after hours (5p -7a ) and weekends on [email or web address]   Agree with above. Required NE overnight for low MAPs. Now off. Remains on milrinone 0.375 Co-ox 72%   Has been off CVVHD. Minimal urine output. Creatinine rising quickly. CVP 11-12.   Remains in NSR on IV amio but rates elevated.   Denies  SOB, orthopnea or PND  General:  Sitting in chair No resp difficulty HEENT: normal Neck: supple. JVP to jaw Carotids 2+ bilat; no bruits. No lymphadenopathy or thryomegaly appreciated. Cor: PMI nondisplaced. Regular tachy No rubs, gallops or murmurs. Lungs: clear Abdomen: obese soft, nontender, nondistended. No hepatosplenomegaly. No bruits or masses. Good bowel sounds. Extremities: no cyanosis, clubbing, rash, 1-2+ edema + UNNA Neuro: alert & orientedx3, cranial nerves grossly intact. moves all 4 extremities w/o difficulty. Affect pleasant  She remains tenuous. Required NE again overnight to maintain MAPs. Now off. Co-ox ok on milrinone.   Will drop milrinone to 0.25. Continue midodrine. Restart NE as needed to keep MAP > = 70 to maintain renal perfusion. Will give test dose of lasix 160 IV today. For iHD tomorrow.   CRITICAL CARE Performed by: Glori Bickers  Total critical care time: 35 minutes  Critical care time was exclusive of separately billable procedures and treating other patients.  Critical care was necessary to treat or prevent imminent or life-threatening deterioration.  Critical care was time spent personally by  me (independent  of midlevel providers or residents) on the following activities: development of treatment plan with patient and/or surrogate as well as nursing, discussions with consultants, evaluation of patient's response to treatment, examination of patient, obtaining history from patient or surrogate, ordering and performing treatments and interventions, ordering and review of laboratory studies, ordering and review of radiographic studies, pulse oximetry and re-evaluation of patient's condition.  Glori Bickers, MD  8:29 AM

## 2020-12-05 NOTE — Progress Notes (Signed)
Physical Therapy Treatment Patient Details Name: Caitlyn Fuentes MRN: [id number] DOB: 1956-01-28 Today's Date: 12/05/2020    History of Present Illness Pt is a 65 y/o female presenting on 11/20/20 with worsening palpitations, SOB and LE edema. Pt found in rapid a fib, chest x-ray with significant vascular congestion. 8/1 hypotensive with cardiogenic shock, transferred to ICU and urgent cardioversion.   S/P TEE and cardioversion 8/4. CRRT started 8/8. PMH includes: afib, cardiomyopathy, HTN, lupus, PAF, CKD.    PT Comments    Pt feeling well overall, but tearful over her situation.  Time spent just providing TLC and listening to her concerns, sit to standing     Follow Up Recommendations  CIR;Supervision/Assistance - 24 hour (depending on how her kidney status progresses)     Equipment Recommendations  Other (comment) (TBA)    Recommendations for Other Services       Precautions / Restrictions Precautions Precautions: Fall Precaution Comments: watch HR    Mobility  Bed Mobility               General bed mobility comments: OOB in recliner upon entry    Transfers Overall transfer level: Needs assistance   Transfers: Sit to/from Stand Sit to Stand: Min guard;+2 safety/equipment Stand pivot transfers: Min guard       General transfer comment: appropriate use of UE's, min guard for safety.  Ambulation/Gait Ambulation/Gait assistance: Min guard;Min assist;+2 safety/equipment Gait Distance (Feet): 120 Feet Assistive device: 1 person hand held assist;None Gait Pattern/deviations: Step-through pattern;Decreased stride length   Gait velocity interpretation: <1.8 ft/sec, indicate of risk for recurrent falls General Gait Details: pt able to take steps without assist or cruise in room for short distances, but needed min HHA for longer distance in the halls.  EHR upper 120's to lower 130's,  SpO2 on RA at lower 90's  RR during gait into the 30's to max 40, but quick  reduction once sitting.   Stairs             Wheelchair Mobility    Modified Rankin (Stroke Patients Only)       Balance     Sitting balance-Leahy Scale: Fair       Standing balance-Leahy Scale: Fair Standing balance comment: can stand statically for short periods without UE assist and no external support.  5 min standing activity at the sink without assist, 1 UE assist at times.                            Cognition Arousal/Alertness: Awake/alert Behavior During Therapy: WFL for tasks assessed/performed (appropriately tearful about potential negative progression of desease process.) Overall Cognitive Status: Within Functional Limits for tasks assessed                                        Exercises      General Comments General comments (skin integrity, edema, etc.): NSR  mid 120's at rest, upper 120's to upper 130's with activity.  SpO2 low to mid 90's on RA      Pertinent Vitals/Pain Pain Assessment: Faces Faces Pain Scale: No hurt Pain Intervention(s): Monitored during session    Home Living                      Prior Function  PT Goals (current goals can now be found in the care plan section) Acute Rehab PT Goals Patient Stated Goal: to get better PT Goal Formulation: With patient Time For Goal Achievement: 12/07/20 Potential to Achieve Goals: Good Progress towards PT goals: Progressing toward goals    Frequency    Min 3X/week      PT Plan Current plan remains appropriate    Co-evaluation              AM-PAC PT "6 Clicks" Mobility   Outcome Measure  Help needed turning from your back to your side while in a flat bed without using bedrails?: A Little Help needed moving from lying on your back to sitting on the side of a flat bed without using bedrails?: A Little Help needed moving to and from a bed to a chair (including a wheelchair)?: A Little Help needed standing up from a chair  using your arms (e.g., wheelchair or bedside chair)?: A Little Help needed to walk in hospital room?: A Little Help needed climbing 3-5 steps with a railing? : A Lot 6 Click Score: 17    End of Session   Activity Tolerance: Patient tolerated treatment well Patient left: in chair;with call bell/phone within reach Nurse Communication: Mobility status PT Visit Diagnosis: Other abnormalities of gait and mobility (R26.89);Muscle weakness (generalized) (M62.81)     Time: 6333-5456 PT Time Calculation (min) (ACUTE ONLY): 26 min  Charges:  $Gait Training: 8-22 mins $Therapeutic Activity: 8-22 mins                     12/05/2020  [name], PT Acute Rehabilitation Services [phone]  (pager) [phone]  (office)   [name] Elowen Debruyn 12/05/2020, 2:31 PM

## 2020-12-05 NOTE — Progress Notes (Addendum)
Occupational Therapy Treatment Patient Details Name: Caitlyn Fuentes MRN: [id number] DOB: August 19, 1955 Today's Date: 12/05/2020    History of present illness Pt is a 65 y/o female presenting on 11/20/20 with worsening palpitations, SOB and LE edema. Pt found in rapid a fib, chest x-ray with significant vascular congestion. 8/1 hypotensive with cardiogenic shock, transferred to ICU and urgent cardioversion.   S/P TEE and cardioversion 8/4. CRRT started 8/8. PMH includes: afib, cardiomyopathy, HTN, lupus, PAF, CKD.   OT comments  Patient progressing well towards OT goals.  Pt labile during session and hopeful that she will be able to tolerate HD.  Motivated with mobilization and completing mobility with min guard, grooming standing with supervision but requires cueing for pacing due to fatigue and decreased tolerance.  Will follow acutely, continue to recommend CIR (pending progress).    Follow Up Recommendations  CIR    Equipment Recommendations  Other (comment) (TBD)    Recommendations for Other Services Rehab consult;Other (comment) (palliative care)    Precautions / Restrictions Precautions Precautions: Fall Precaution Comments: watch HR Restrictions Weight Bearing Restrictions: No       Mobility Bed Mobility               General bed mobility comments: OOB in recliner    Transfers Overall transfer level: Needs assistance   Transfers: Sit to/from Stand Sit to Stand: Min guard Stand pivot transfers: Min guard       General transfer comment: min guard for safety, no physical assist required    Balance Overall balance assessment: Mild deficits observed, not formally tested   Sitting balance-Leahy Scale: Fair       Standing balance-Leahy Scale: Fair Standing balance comment: can stand statically for short periods without UE assist and no external support.  5 min standing activity at the sink without assist, 1 UE assist at times.                            ADL either performed or assessed with clinical judgement   ADL Overall ADL's : Needs assistance/impaired     Grooming: Wash/dry face;Oral care;Supervision/safety;Standing Grooming Details (indicate cue type and reason): standing at sink, 1 seated rest break required                 Toilet Transfer: Min guard;Ambulation Toilet Transfer Details (indicate cue type and reason): in room, simulated         Functional mobility during ADLs: Min guard General ADL Comments: limited by decreased activity tolerance and fatigues esaily     Vision       Perception     Praxis      Cognition Arousal/Alertness: Awake/alert Behavior During Therapy: WFL for tasks assessed/performed Overall Cognitive Status: Within Functional Limits for tasks assessed                                 General Comments: pt labile about possibility of not being able to tolerate HD        Exercises     Shoulder Instructions       General Comments pt HR to 138 with activity, otherwise VSS on RA; energy conservation with seated rest breaks  throughout session    Pertinent Vitals/ Pain       Pain Assessment: No/denies pain Faces Pain Scale: No hurt Pain Intervention(s): Monitored during session  Home Living  Prior Functioning/Environment              Frequency  Min 2X/week        Progress Toward Goals  OT Goals(current goals can now be found in the care plan section)  Progress towards OT goals: Progressing toward goals  Acute Rehab OT Goals Patient Stated Goal: to get better OT Goal Formulation: With patient  Plan Discharge plan remains appropriate;Frequency remains appropriate    Co-evaluation                 AM-PAC OT "6 Clicks" Daily Activity     Outcome Measure   Help from another person eating meals?: None Help from another person taking care of personal grooming?: A Little Help  from another person toileting, which includes using toliet, bedpan, or urinal?: A Little Help from another person bathing (including washing, rinsing, drying)?: A Lot Help from another person to put on and taking off regular upper body clothing?: A Little Help from another person to put on and taking off regular lower body clothing?: A Lot 6 Click Score: 17    End of Session    OT Visit Diagnosis: Other abnormalities of gait and mobility (R26.89);Muscle weakness (generalized) (M62.81)   Activity Tolerance Patient tolerated treatment well   Patient Left in chair;with call bell/phone within reach;with nursing/sitter in room   Nurse Communication Mobility status        Time: 1146-1200 OT Time Calculation (min): 14 min  Charges: OT General Charges $OT Visit: 1 Visit OT Treatments $Self Care/Home Management : 8-22 mins  [place]o Pager [phone] Office [phone]  [phone] 1 Self care    Delight Stare 12/05/2020, 5:41 PM

## 2020-12-05 NOTE — Progress Notes (Signed)
   Back in A fib RVR 130-140s  Increase amio to 60 mg  and give 150 mg IV amio bolus now.   Adams Hinch NP-C  5:19 PM

## 2020-12-05 NOTE — Progress Notes (Addendum)
ANTICOAGULATION CONSULT NOTE - Follow-Up Consult  Pharmacy Consult for Heparin Indication: atrial fibrillation  Patient Measurements: Height: 5' 3.5" (161.3 cm) Weight: 116.7 kg (257 lb 4.4 oz) IBW/kg (Calculated) : 53.55 Heparin Dosing Weight: 85 kg  Vital Signs: Temp: 98.5 F (36.9 C) (08/15 0752) Temp Source: Oral (08/15 0752) BP: 107/49 (08/15 0830) Pulse Rate: 116 (08/15 0830)  Labs: Recent Labs    12/03/20 0450 12/04/20 0407 12/05/20 0403  HGB 11.3* 10.5* 9.7*  HCT 36.5 33.1* 30.5*  PLT 136* 132* 121*  HEPARINUNFRC 0.60 0.66 0.51  CREATININE 2.04* 3.76*  3.79* 5.84*     Estimated Creatinine Clearance: 12.1 mL/min (A) (by C-G formula based on SCr of 5.84 mg/dL (H)).   Medical History: Past Medical History:  Diagnosis Date   A-fib ([place])    Cardiomyopathy    Hyperlipidemia    Hypertension    Lupus ([place])    Pulmonary hypertension ([place]) 05/10/2011   Echo, EF-40-45   Renal disorder    Sleep apnea 2008   CPAP, pt does not know settings   Thyroid disease     Medications:     Assessment: 65 y.o. female with  h/o Afib, DOAC on hold, for heparin.  Pt was on Xarelto PTA and changed to Eliquis due to procedures.  Eliquis 5 mg last given at 2317 7/31.   Heparin level therapeutic this morning on 1450 unit/h. Hgb slight trend down to 9.7, plt count also trending down 200s last week then dropped to 130 this weekend, 121 today. MD sending hit antibody.   Goal of Therapy:  Heparin level 0.5-0.7 units/ml Monitor platelets by anticoagulation protocol: Yes   Plan:  Continue heparin at 1450 units/hr Heparin level with AM labs Follow-up transition back to [place] Follow up Hit ab  Erin Hearing PharmD., BCPS Clinical Pharmacist 12/05/2020 9:22 AM

## 2020-12-05 NOTE — Progress Notes (Signed)
Patient ID: Caitlyn Fuentes, female   DOB: [birth year], 65 y.o.   MRN: [id number] [facility] Progress Note   Assessment/ Plan:   1. Acute kidney Injury on chronic kidney disease stage IV: Without significant response to trial of furosemide yesterday labs do not show evidence of renal recovery.  Will order for hemodialysis again tomorrow; had discussion with the patient that if she was not able to tolerate this with ultrafiltration, will need to transition to hospice/palliative care. 2.  Acute exacerbation of congestive heart failure: On milrinone for inotropic support and previously on norepinephrine drip for cardiogenic shock.  Started on CRRT for volume unloading after limited response to diuretics and planning transitioning to IHD starting tomorrow. 3.  Atrial fibrillation with rapid ventricular response: RVR with decreased cardiac output likely contributing to renal hypoperfusion/dysfunction. 4.  Hyponatremia: Secondary to acute exacerbation of congestive heart failure/worsening renal function and impaired free water handling.  Improved partly with CRRT/volume unloading and now trending down again-we discussed the importance of fluid restriction. 5.  Anemia of chronic disease: Including chronic kidney disease, iron saturation adequate.  Will treat with ESA.  Subjective:   Reports that she is feeling well and able to ambulate around her room without significant shortness of breath.  Denies chest pain/palpitations.   Objective:   BP 120/71   Pulse (!) 120   Temp 98.3 F (36.8 C) (Oral)   Resp (!) 22   Ht 5' 3.5" (1.613 m)   Wt 116.7 kg   SpO2 91%   BMI 44.86 kg/m   Intake/Output Summary (Last 24 hours) at 12/05/2020 3354 Last data filed at 12/05/2020 0600 Gross per 24 hour  Intake 1785.91 ml  Output 100 ml  Net 1685.91 ml   Weight change: -0.9 kg  Physical Exam: Gen: Sitting up in recliner, eating breakfast, not dyspneic CVS: Pulse irregularly irregular, normal  rate, S1 and S2 normal Resp: Clear to auscultation bilaterally without distinct rales or rhonchi.  Right IJ temporary dialysis catheter. Abd: Soft, obese, nontender, bowel sounds normal Ext: 1-2+ + pitting lower extremity bilateral  Imaging: No results found.  Labs: BMET Recent Labs  Lab 11/30/20 1610 12/01/20 0410 12/01/20 1702 12/02/20 0412 12/02/20 1526 12/03/20 0450 12/04/20 0407 12/05/20 0403  NA 134* 133* 133* 135 134* 132* 132*  132* 130*  K 4.4 4.0 4.2 3.7 3.8 4.0 3.9  3.9 4.3  CL 100 97* 99 100 99 99 96*  96* 97*  CO2 26 25 25 25 25 23 22  22  21*  GLUCOSE 133* 117* 154* 103* 121* 88 100*  101* 102*  BUN 17 16 17 14 17 15  29*  29* 53*  CREATININE 1.42* 1.88* 1.83* 1.83* 2.17* 2.04* 3.76*  3.79* 5.84*  CALCIUM 8.8* 9.5 9.3 9.5 9.0 9.3 10.0  10.0 9.6  PHOS 2.7 2.7 2.2* 2.3* 2.6 2.4* 4.1  --    CBC Recent Labs  Lab 12/02/20 0730 12/03/20 0450 12/04/20 0407 12/05/20 0403  WBC 8.6 9.3 11.9* 12.8*  HGB 11.6* 11.3* 10.5* 9.7*  HCT 36.5 36.5 33.1* 30.5*  MCV 101.4* 103.4* 102.5* 102.7*  PLT 188 136* 132* 121*    Medications:     (feeding supplement) PROSource Plus  30 mL Oral BID BM   B-complex with vitamin C  1 tablet Oral Daily   calcitRIOL  0.25 mcg Oral Daily   Chlorhexidine Gluconate Cloth  6 each Topical Daily   clotrimazole  1 application Topical BID   estradiol  1 mg Oral Daily  febuxostat  40 mg Oral q AM   ferrous sulfate  325 mg Oral q morning   fluticasone  2 spray Each Nare Daily   fluticasone furoate-vilanterol  1 puff Inhalation Daily   gabapentin  100 mg Oral TID   insulin aspart  0-6 Units Subcutaneous TID WC   levothyroxine  88 mcg Oral q morning   lidocaine  1 patch Transdermal Q24H   midodrine  15 mg Oral TID WC   predniSONE  5 mg Oral Q breakfast   senna-docusate  2 tablet Oral BID   sodium chloride flush  10-40 mL Intracatheter Q12H   sodium chloride flush  3 mL Intravenous Q12H   Elmarie Shiley, MD 12/05/2020, 7:02 AM

## 2020-12-05 NOTE — Progress Notes (Signed)
Orthopedic Tech Progress Note Patient Details:  Caitlyn Fuentes [birth year] [id number]  Ortho Devices Type of Ortho Device: Haematologist Ortho Device/Splint Location: BLE Ortho Device/Splint Interventions: Ordered, Application, Adjustment   Post Interventions Patient Tolerated: Well Instructions Provided: [facility] [place] 12/05/2020, 9:55 AM

## 2020-12-06 DIAGNOSIS — R570 Cardiogenic shock: Secondary | ICD-10-CM | POA: Diagnosis not present

## 2020-12-06 DIAGNOSIS — N179 Acute kidney failure, unspecified: Secondary | ICD-10-CM | POA: Diagnosis not present

## 2020-12-06 DIAGNOSIS — I4891 Unspecified atrial fibrillation: Secondary | ICD-10-CM | POA: Diagnosis not present

## 2020-12-06 LAB — BASIC METABOLIC PANEL
Anion gap: 16 — ABNORMAL HIGH (ref 5–15)
BUN: 70 mg/dL — ABNORMAL HIGH (ref 8–23)
CO2: 20 mmol/L — ABNORMAL LOW (ref 22–32)
Calcium: 9.8 mg/dL (ref 8.9–10.3)
Chloride: 92 mmol/L — ABNORMAL LOW (ref 98–111)
Creatinine, Ser: 7.33 mg/dL — ABNORMAL HIGH (ref 0.44–1.00)
GFR, Estimated: 6 mL/min — ABNORMAL LOW (ref >60)
Glucose, Bld: 103 mg/dL — ABNORMAL HIGH (ref 70–99)
Potassium: 4.3 mmol/L (ref 3.5–5.1)
Sodium: 128 mmol/L — ABNORMAL LOW (ref 135–145)

## 2020-12-06 LAB — COOXEMETRY PANEL
Carboxyhemoglobin: 1.6 % — ABNORMAL HIGH (ref 0.5–1.5)
Methemoglobin: 0.8 % (ref 0.0–1.5)
O2 Saturation: 78.7 %
Total hemoglobin: 9.5 g/dL — ABNORMAL LOW (ref 12.0–16.0)

## 2020-12-06 LAB — HEPARIN LEVEL (UNFRACTIONATED): Heparin Unfractionated: 0.59 IU/mL (ref 0.30–0.70)

## 2020-12-06 LAB — MAGNESIUM: Magnesium: 2.1 mg/dL (ref 1.7–2.4)

## 2020-12-06 LAB — GLUCOSE, CAPILLARY
Glucose-Capillary: 108 mg/dL — ABNORMAL HIGH (ref 70–99)
Glucose-Capillary: 131 mg/dL — ABNORMAL HIGH (ref 70–99)
Glucose-Capillary: 141 mg/dL — ABNORMAL HIGH (ref 70–99)
Glucose-Capillary: 102 mg/dL — ABNORMAL HIGH (ref 70–99)

## 2020-12-06 LAB — CBC
HCT: 28.5 % — ABNORMAL LOW (ref 36.0–46.0)
Hemoglobin: 9 g/dL — ABNORMAL LOW (ref 12.0–15.0)
MCH: 32.4 pg (ref 26.0–34.0)
MCHC: 31.6 g/dL (ref 30.0–36.0)
MCV: 102.5 fL — ABNORMAL HIGH (ref 80.0–100.0)
Platelets: 104 10*3/uL — ABNORMAL LOW (ref 150–400)
RBC: 2.78 MIL/uL — ABNORMAL LOW (ref 3.87–5.11)
RDW: 14.2 % (ref 11.5–15.5)
WBC: 9.6 10*3/uL (ref 4.0–10.5)
nRBC: 0.2 % (ref 0.0–0.2)

## 2020-12-06 LAB — HEPARIN INDUCED PLATELET AB (HIT ANTIBODY): Heparin Induced Plt Ab: 0.087 OD (ref 0.000–0.400)

## 2020-12-06 LAB — HEPATITIS B SURFACE ANTIGEN: Hepatitis B Surface Ag: NONREACTIVE

## 2020-12-06 MED ORDER — ALTEPLASE 2 MG IJ SOLR
2.0000 mg | Freq: Once | INTRAMUSCULAR | Status: DC | PRN
  Filled 2020-12-06: qty 2

## 2020-12-06 MED ORDER — SODIUM CHLORIDE 0.9 % IV SOLN: 100.0000 mL | INTRAVENOUS | Status: DC | PRN

## 2020-12-06 MED ORDER — HEPARIN SODIUM (PORCINE) 1000 UNIT/ML IJ SOLN
INTRAMUSCULAR | Status: AC
  Filled 2020-12-06: qty 4

## 2020-12-06 MED ORDER — HEPARIN SODIUM (PORCINE) 1000 UNIT/ML DIALYSIS: 1000.0000 [IU] | INTRAMUSCULAR | Status: DC | PRN

## 2020-12-06 MED ORDER — ACETAMINOPHEN 325 MG PO TABS
650.0000 mg | ORAL_TABLET | Freq: Four times a day (QID) | ORAL | Status: DC | PRN
  Administered 2020-12-06 – 2020-12-17 (×3): 650 mg via ORAL
  Filled 2020-12-06 (×3): qty 2

## 2020-12-06 MED ORDER — CALCIUM CARBONATE ANTACID 500 MG PO CHEW
1.0000 | CHEWABLE_TABLET | Freq: Three times a day (TID) | ORAL | Status: DC | PRN
  Administered 2020-12-07: 200 mg via ORAL
  Filled 2020-12-06: qty 1

## 2020-12-06 MED ORDER — HEPARIN SODIUM (PORCINE) 1000 UNIT/ML DIALYSIS
40.0000 [IU]/kg | INTRAMUSCULAR | Status: DC | PRN
  Filled 2020-12-06: qty 5

## 2020-12-06 NOTE — Progress Notes (Signed)
Advanced Heart Failure Rounding Note  PCP-Cardiologist: Shelva Majestic, MD  HF clinic: Dr. Haroldine Laws  Subjective:    Events: - 8/1 A fib RVR & A/C systolic heart faiure --> cardiogenic shock. Had urgent DC-CV with brief conversion to NSR but went back in A fib. . On Norepi + milrinone. AKI. Nephrology consulted.  - 8/2 A fib RVR, amio gtt. Continued on milrinone 0.375 mcg + norepi 2 mcg. Started on prednisone 40 mg daily x3 days for gout flare.  - 8/3 did not tolerate milrinone wean w/ drop in Co-ox 68%--->48% and decrease in UOP. Milrinone increased back to 0.375.  Lasix gtt increased to 30/hr.  - 8/4 NE added back given persistently low Co-ox - 8/4 s/p TEE/DCCV>>NSR  - 8/5 back in Afib w/ RVR  -  08/09 HD cath placed, CRRT started - 8/10: conversion to sinus tachycardia  - 8/13 CRRT stopped  Off NE. On milrinone at 0.375 and amio at 30.  Had recurrent AF yesterday. Now back in NSR Co-ox 79%  Received 160 mg of IV Lasix yesterday. Only 250cc out. SCr up to 7.33. CVP 18-20  Denies CP, SOB. Edema coming back   Objective:   Weight Range: 116.7 kg Body mass index is 44.86 kg/m.   Vital Signs:   Temp:  [97.8 F (36.6 C)-98.5 F (36.9 C)] 97.9 F (36.6 C) (08/16 0400) Pulse Rate:  [100-131] 102 (08/16 0400) Resp:  [13-33] 20 (08/16 0400) BP: (90-137)/(49-86) 111/67 (08/16 0400) SpO2:  [90 %-100 %] 96 % (08/16 0400) Last BM Date: 12/03/20  Weight change: Filed Weights   12/03/20 0600 12/04/20 0500 12/05/20 0500  Weight: 115.7 kg 117.6 kg 116.7 kg    Intake/Output:   Intake/Output Summary (Last 24 hours) at 12/06/2020 0651 Last data filed at 12/06/2020 0301 Gross per 24 hour  Intake 3361.18 ml  Output 250 ml  Net 3111.18 ml      Physical Exam   General:  Sitting in chair. No resp difficulty Neck: supple. RIJ HD cath  Carotids 2+ bilat; no bruits. No lymphadenopathy or thryomegaly appreciated. Cor: PMI nondisplaced. Regular tachy No rubs, gallops or  murmurs. Lungs: clear Abdomen: soft, nontender, nondistended. No hepatosplenomegaly. No bruits or masses. Good bowel sounds. Extremities: no cyanosis, clubbing, rash, 2-3+ edema + UNNA Neuro: alert & orientedx3, cranial nerves grossly intact. moves all 4 extremities w/o difficulty. Affect pleasant   Telemetry   Sinus tach 100-110 Personally reviewed  Labs    CBC Recent Labs    12/05/20 0403 12/06/20 0424  WBC 12.8* 9.6  HGB 9.7* 9.0*  HCT 30.5* 28.5*  MCV 102.7* 102.5*  PLT 121* 104*    Basic Metabolic Panel Recent Labs    12/04/20 0407 12/05/20 0403 12/06/20 0424  NA 132*  132* 130* 128*  K 3.9  3.9 4.3 4.3  CL 96*  96* 97* 92*  CO2 22  22 21* 20*  GLUCOSE 100*  101* 102* 103*  BUN 29*  29* 53* 70*  CREATININE 3.76*  3.79* 5.84* 7.33*  CALCIUM 10.0  10.0 9.6 9.8  MG 2.3 2.2 2.1  PHOS 4.1  --   --     Liver Function Tests Recent Labs    12/04/20 0407  ALBUMIN 2.9*    No results for input(s): LIPASE, AMYLASE in the last 72 hours. Cardiac Enzymes No results for input(s): CKTOTAL, CKMB, CKMBINDEX, TROPONINI in the last 72 hours.  BNP: BNP (last 3 results) Recent Labs    11/20/20 0318  11/21/20 0258  BNP 2,051.3* 2,022.4*     ProBNP (last 3 results) No results for input(s): PROBNP in the last 8760 hours.   D-Dimer No results for input(s): DDIMER in the last 72 hours. Hemoglobin A1C No results for input(s): HGBA1C in the last 72 hours.  Fasting Lipid Panel No results for input(s): CHOL, HDL, LDLCALC, TRIG, CHOLHDL, LDLDIRECT in the last 72 hours. Thyroid Function Tests No results for input(s): TSH, T4TOTAL, T3FREE, THYROIDAB in the last 72 hours.  Invalid input(s): FREET3   Other results:   Imaging    No results found.   Medications:     Scheduled Medications:  heparin sodium (porcine)       (feeding supplement) PROSource Plus  30 mL Oral BID BM   B-complex with vitamin C  1 tablet Oral Daily   calcitRIOL  0.25 mcg  Oral Daily   Chlorhexidine Gluconate Cloth  6 each Topical Daily   clotrimazole  1 application Topical BID   estradiol  1 mg Oral Daily   febuxostat  40 mg Oral q AM   ferrous sulfate  325 mg Oral q morning   fluticasone  2 spray Each Nare Daily   fluticasone furoate-vilanterol  1 puff Inhalation Daily   gabapentin  100 mg Oral TID   insulin aspart  0-6 Units Subcutaneous TID WC   levothyroxine  88 mcg Oral q morning   lidocaine  1 patch Transdermal Q24H   midodrine  15 mg Oral TID WC   predniSONE  5 mg Oral Q breakfast   senna-docusate  2 tablet Oral BID   sodium chloride flush  10-40 mL Intracatheter Q12H   sodium chloride flush  3 mL Intravenous Q12H    Infusions:  sodium chloride     sodium chloride     sodium chloride     amiodarone 60 mg/hr (12/06/20 0400)   heparin 1,450 Units/hr (12/06/20 0400)   milrinone 0.375 mcg/kg/min (12/06/20 0413)   norepinephrine (LEVOPHED) Adult infusion Stopped (12/05/20 0519)    PRN Medications: sodium chloride, sodium chloride, sodium chloride, albuterol, alteplase, cyclobenzaprine, heparin, heparin, HYDROcodone-acetaminophen, hydrocortisone cream, ondansetron (ZOFRAN) IV, oxyCODONE, sodium chloride flush, sodium chloride flush    Patient Profile   Caitlyn Fuentes is a 65 year old with a history of chronic systolic hf previously EF 45%,  NICM, hypothyroid, PAF, gout, OSA, CKD Stage IV, HTN, hyperlipidemia, and lupus.   Admitted with A fib RVR and A/C systolic heart failure complicated by cardiogenic shock.     Assessment/Plan   1. Acute/Chronic Systolic Heart Failure -> Cardiogenic shock in setting AF with RVR - 8/1 Lactic acid 1.5. CO-OX 43%.--> started on milrinone and NE - Echo 7/22 EF 30-35% (2021)  This admit -> 25-30%. - NE stopped 8/12, then resumed briefly the morning of 8/15 -- later weaned off.  Remains on milrinone 0.375. Co-ox 78% today. Can decrease milrinone to 0.25 today. - Volume status was much improved after CVVHD --  stopped 8/13.  Received a dose of IV Lasix 160 yesterday with minimal response. Not volume markedly elevated again - Will need iHD today - Unable to tolerate GDMT due to hypotension and AKI - Not candidate for advanced therapies with size and ESRD if unable to tolerate iHD off pressors would require Hospice evaluation - Resume NE as needed to maintain MAP of at least 70  - Palliative care consult    2. PAF--> Afib RVR - Developed shock and had urgent cardioversion 8/1 with conversion to NSR  w/ ERAF - Repeat DCCV 8/4 w/ ERAF - Conversion to sinus tachycardia on 8/10 with IV amio - In/out AF on 8/15 - Maintaining sinus now . Continue amiodarone at 30 mg/hr while on inotropes  - unfortunately, not a candidate for ablation due to size if AF were to recur. Not candidate for AVN ablation and CRT. - Continue heparin gtt as patient will likely need additional HD access soon, but monitoring plt count with HIT pending   3. AKI on CKD Stage IV -> ESRD - Baseline SCr ~3.2, now up to 5.84 -> 7.33 - Renal US with no acute findings - Off CRRT on 8/13. Volume status back up today. No response to high-dose lasix. Per nephrology, iHD on 8/16 -- will evaluate BP tolerated. If not, will need hospice consideration.  - Will keep in ICU while we are determining if she can tolerate iHD. Restart NE as needed to keep MAP >= 70 to preserve renal perfusion   4. OSA - Uses CPAP, but refused overnight    5. Hypothyroidism -TSH 8. T3 ok and T4 2.3  -On synthroid. Dose increased to 88 mcg -Repeat TSH in 6 weeks.    6. Gout - Uric acid 10. Pain RLE ? Flare  - On uloric 40 mg daily  - Completed 3 day course of prednisone    7. Hypokalemia -- now resolved - K 4.3 today  8. Deconditioning - Continue PT/OT - needs HHPT. ? SNF   9. Hypervolemic hyponatremia -- improving - Na 130 -> 128 today - limit free H2O -- 1200 mL fluid restriction ordered  - HD today  10. Lupus - on low-dose prednisone.  11.  Thrombocytopenia - Plt count of 121 -> 104 -- has steadily dropped since 8/11  - HIT panel sent  - No active bleeding noted   CRITICAL CARE Performed by: Glori Bickers  Total critical care time: 35 minutes  Critical care time was exclusive of separately billable procedures and treating other patients.  Critical care was necessary to treat or prevent imminent or life-threatening deterioration.  Critical care was time spent personally by me (independent of midlevel providers or residents) on the following activities: development of treatment plan with patient and/or surrogate as well as nursing, discussions with consultants, evaluation of patient's response to treatment, examination of patient, obtaining history from patient or surrogate, ordering and performing treatments and interventions, ordering and review of laboratory studies, ordering and review of radiographic studies, pulse oximetry and re-evaluation of patient's condition.    Glori Bickers, MD  12/06/2020, 6:51 AM  Advanced Heart Failure Team Pager [phone] (M-F; 7a - 5p)  Please contact [facility] for night-coverage after hours (5p -7a ) and weekends on [email or web address]

## 2020-12-06 NOTE — Progress Notes (Signed)
Patient ID: Caitlyn Fuentes, female   DOB: 21-Nov-1955, 65 y.o.   MRN: [id number] [facility] Progress Note   Assessment/ Plan:   1. Acute kidney Injury on chronic kidney disease stage IV: Without significant response to trial of high-dose furosemide yesterday and labs show continued worsening of BUN/creatinine.  At this point, I am very skeptical as to whether she will have meaningful renal recovery and will order for hemodialysis again today for ultrafiltration/clearance.  We will assess her ability to tolerate dialysis while here in the hospital before beginning the process for outpatient placement. 2.  Acute exacerbation of congestive heart failure: On milrinone for inotropic support and previously on norepinephrine drip for cardiogenic shock.  Previously on CRRT for volume unloading and is now going to transition to intermittent hemodialysis starting today.  Remains on milrinone. 3.  Atrial fibrillation with rapid ventricular response: RVR with decreased cardiac output likely contributing to renal hypoperfusion/dysfunction. 4.  Hyponatremia: Secondary to acute exacerbation of congestive heart failure/worsening renal function and impaired free water handling.  Improved partly with CRRT/volume unloading and now trending down again-we discussed the importance of fluid restriction. 5.  Anemia of chronic disease/chronic kidney disease: Iron stores are replete and she has been started on ESA.  Subjective:   Had some transient chest pain at about 2:00 this morning which resolved.  Denies any shortness of breath at rest and complains of increased arm tremors.   Objective:   BP 111/67   Pulse (!) 109   Temp 97.9 F (36.6 C) (Oral)   Resp (!) 21   Ht 5' 3.5" (1.613 m)   Wt 116.7 kg   SpO2 98%   BMI 44.86 kg/m   Intake/Output Summary (Last 24 hours) at 12/06/2020 0721 Last data filed at 12/06/2020 0301 Gross per 24 hour  Intake 3361.18 ml  Output 250 ml  Net 3111.18 ml   Weight  change:   Physical Exam: Gen: Sitting up in recliner eating breakfast intermittent, hand tremors noted  CVS: Pulse irregularly irregular tachycardia, S1 and S2 normal Resp: Clear to auscultation bilaterally without distinct rales or rhonchi.  Right IJ temporary dialysis catheter. Abd: Soft, obese, nontender, bowel sounds normal Ext: 1-2+ + pitting lower extremity bilateral  Imaging: No results found.  Labs: BMET Recent Labs  Lab 11/30/20 1610 12/01/20 0410 12/01/20 1702 12/02/20 0412 12/02/20 1526 12/03/20 0450 12/04/20 0407 12/05/20 0403 12/06/20 0424  NA 134* 133* 133* 135 134* 132* 132*  132* 130* 128*  K 4.4 4.0 4.2 3.7 3.8 4.0 3.9  3.9 4.3 4.3  CL 100 97* 99 100 99 99 96*  96* 97* 92*  CO2 26 25 25 25 25 23 22  22  21* 20*  GLUCOSE 133* 117* 154* 103* 121* 88 100*  101* 102* 103*  BUN 17 16 17 14 17 15  29*  29* 53* 70*  CREATININE 1.42* 1.88* 1.83* 1.83* 2.17* 2.04* 3.76*  3.79* 5.84* 7.33*  CALCIUM 8.8* 9.5 9.3 9.5 9.0 9.3 10.0  10.0 9.6 9.8  PHOS 2.7 2.7 2.2* 2.3* 2.6 2.4* 4.1  --   --    CBC Recent Labs  Lab 12/03/20 0450 12/04/20 0407 12/05/20 0403 12/06/20 0424  WBC 9.3 11.9* 12.8* 9.6  HGB 11.3* 10.5* 9.7* 9.0*  HCT 36.5 33.1* 30.5* 28.5*  MCV 103.4* 102.5* 102.7* 102.5*  PLT 136* 132* 121* 104*    Medications:     (feeding supplement) PROSource Plus  30 mL Oral BID BM   B-complex with vitamin C  1 tablet Oral Daily   calcitRIOL  0.25 mcg Oral Daily   Chlorhexidine Gluconate Cloth  6 each Topical Daily   clotrimazole  1 application Topical BID   estradiol  1 mg Oral Daily   febuxostat  40 mg Oral q AM   ferrous sulfate  325 mg Oral q morning   fluticasone  2 spray Each Nare Daily   fluticasone furoate-vilanterol  1 puff Inhalation Daily   gabapentin  100 mg Oral TID   heparin sodium (porcine)       insulin aspart  0-6 Units Subcutaneous TID WC   levothyroxine  88 mcg Oral q morning   lidocaine  1 patch Transdermal Q24H   midodrine   15 mg Oral TID WC   predniSONE  5 mg Oral Q breakfast   senna-docusate  2 tablet Oral BID   sodium chloride flush  10-40 mL Intracatheter Q12H   sodium chloride flush  3 mL Intravenous Q12H   Elmarie Shiley, MD 12/06/2020, 7:21 AM

## 2020-12-06 NOTE — Progress Notes (Signed)
Patient does not want to wear CPAP tonight. Pt has [place] on at 2L.

## 2020-12-06 NOTE — TOC Progression Note (Signed)
Transition of Care (TOC) - Progression Note  Heart Failure   Patient Details  Name: Caitlyn Fuentes MRN: [id number] Date of Birth: [birth year]  Transition of Care [facility]) CM/SW [place], [place] Phone Number: 12/06/2020, 5:01 PM  Clinical Narrative:    CSW received consult for possible SNF placement at time of discharge just in case CIR doesn't work out. CSW spoke with patient and her daughter and would prefer CIR over SNF. Patient reported that patient's spouse is currently unable to care for patient at their home given patient's current physical needs and fall risk and that [name] is a caregiver for her husband. Patient expressed understanding of PT recommendation and is agreeable to CIR and maybe SNF placement at time of discharge if CIR doesn't work out. Patient reports preference for CIR. CSW discussed insurance authorization process and provided Medicare SNF ratings list. Patient has received the COVID vaccines X4. Patient expressed being hopeful for rehab and to feel better soon. No further questions reported at this time.   Skilled Nursing Rehab Facilities-   [email or web address] *Ratings updated quarterly   Ratings out of 5 possible   Name Address  Phone # [place] Inspection Overall  [facility] [address], [place] 5  4 4   [facility]  [address] [place], [place] [phone] 4 2 4 4   [facility] [place], [place] 2 3 1 1   [place] [place], [place] 3 3 4 4   [facility] [address] [place] 3  2 2   [place] [place] 2 2 4 4   [facility] [address], [place] 5 2 2 3   [facility] [address], [place] 4 2 2 2   [place] [facility] 5 2 2 3    [facility] [phone] [address], [place] [phone] 5 1 2 2   [facility] [address], [facility] [phone] 5 2 2 3   [place] [phone] 3 1 1 1       Expected Discharge Plan: [place] Barriers to Discharge: Continued Medical Work up  Expected Discharge Plan and Services Expected Discharge Plan: [place] In-house Referral: Clinical Social Work Discharge Planning Services: CM Consult Post Acute Care Choice: [place] arrangements for the past 2 months: [place]                 DME Arranged: [place] rolling with seat DME Agency: [employer] Date DME Agency Contacted: 11/22/20 Time DME Agency Contacted: [phone] Representative spoke with at DME Agency: Melina Modena HH Arranged: RN, PT, OT [facility] Agency: [place] Date [place]: 11/22/20   Representative spoke with at [place]: Adela Lank   Social Determinants of Health (SDOH) Interventions Food Insecurity Interventions: Assist with SNAP Application Financial Strain Interventions: Other (Comment) (Referral to [facility]) Housing Interventions: Intervention Not Indicated Transportation Interventions: Intervention Not Indicated  Readmission Risk Interventions No flowsheet data found.  Janthony Holleman, MSW, [place] Heart Failure Social Worker

## 2020-12-06 NOTE — Progress Notes (Signed)
ANTICOAGULATION CONSULT NOTE - Follow-Up Consult  Pharmacy Consult for Heparin Indication: atrial fibrillation  Patient Measurements: Height: 5' 3.5" (161.3 cm) Weight: 121.7 kg (268 lb 4.8 oz) IBW/kg (Calculated) : 53.55 Heparin Dosing Weight: 85 kg  Vital Signs: Temp: 97.9 F (36.6 C) (08/16 0700) Temp Source: Oral (08/16 0700) BP: 122/84 (08/16 0500) Pulse Rate: 109 (08/16 0700)  Labs: Recent Labs    12/04/20 0407 12/05/20 0403 12/06/20 0424  HGB 10.5* 9.7* 9.0*  HCT 33.1* 30.5* 28.5*  PLT 132* 121* 104*  HEPARINUNFRC 0.66 0.51 0.59  CREATININE 3.76*  3.79* 5.84* 7.33*     Estimated Creatinine Clearance: 9.9 mL/min (A) (by C-G formula based on SCr of 7.33 mg/dL (H)).   Medical History: Past Medical History:  Diagnosis Date   A-fib ([place])    Cardiomyopathy    Hyperlipidemia    Hypertension    Lupus ([place])    Pulmonary hypertension ([place]) 05/10/2011   Echo, [id number]   Renal disorder    Sleep apnea 2008   CPAP, pt does not know settings   Thyroid disease     Medications:     Assessment: 65 y.o. female with  h/o Afib, DOAC on hold, for heparin.  Pt was on Xarelto PTA and changed to Eliquis due to procedures.  Eliquis 5 mg last given at 2317 7/31.   Heparin level therapeutic this morning on 1450 unit/h. Hgb continues to trend down to 9, plt count also trending down 104. Hit antibody pending.  Goal of Therapy:  Heparin level 0.5-0.7 units/ml Monitor platelets by anticoagulation protocol: Yes   Plan:  Continue heparin at 1450 units/hr Heparin level with AM labs Follow-up transition back to [place] after procedures are complete Follow up Hit ab  Erin Hearing PharmD., BCPS Clinical Pharmacist 12/06/2020 8:01 AM

## 2020-12-06 NOTE — NC FL2 (Signed)
[place] MEDICAID FL2 LEVEL OF CARE SCREENING TOOL     IDENTIFICATION  Patient Name: Caitlyn Fuentes Birthdate: 1955/05/23 Sex: female Admission Date (Current Location): 11/20/2020  [facility] Care Communities and [place] Number:  Herbalist and Address:  [facility], [place] [address] [place], [place] [address]      Provider Number: [id number]  Attending Physician Name and Address:  Jolaine Artist, MD  Relative Name and Phone Number:  Richelle Ito, Daughter   [phone]    Current Level of Care: Hospital Recommended Level of Care: [place] Prior Approval Number:    Date Approved/Denied:   PASRR Number: [id number]  Discharge Plan: SNF    Current Diagnoses: Patient Active Problem List   Diagnosis Date Noted   Atrial fibrillation with rapid ventricular response ([place]) 11/20/2020   CHF (congestive heart failure) ([place]) 11/20/2020   CKD (chronic kidney disease) stage 4, GFR 15-29 ml/min ([place]) 10/16/2019   AKI (acute kidney injury) ([place]) 88/41/6606   Chronic systolic congestive heart failure ([place]) 10/16/2019   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) 05/25/2015   Palpitations 05/25/2015   Gout 10/16/2013   Hyperlipidemia 10/16/2013   Long term current use of anticoagulant therapy 07/20/2013   Chest pain, atypical 08/16/2011   Morbid obesity ([place]) 08/16/2011   Bilateral lower extremity edema 08/16/2011   Obstructive sleep apnea on CPAP 08/16/2011   Paroxysmal atrial fibrillation ([place]) 08/16/2011    Orientation RESPIRATION BLADDER Height & Weight      Self, Time, Situation, Place  O2 (2L) Continent Weight: 268 lb 4.8 oz (121.7 kg) Height:  5' 3.5" (161.3 cm)  BEHAVIORAL SYMPTOMS/MOOD NEUROLOGICAL BOWEL NUTRITION STATUS      Continent Diet (Please see D/C Summary)  AMBULATORY STATUS COMMUNICATION OF NEEDS Skin   Limited Assist Verbally Normal                       Personal Care Assistance Level of Assistance  Bathing, Feeding, Dressing Bathing Assistance: Limited assistance Feeding assistance: Independent Dressing Assistance: Limited assistance     Functional Limitations Info  Sight, Hearing, Speech Sight Info: Adequate Hearing Info: Adequate Speech Info: Adequate    SPECIAL CARE FACTORS FREQUENCY  OT (By licensed OT), PT (By licensed PT)     PT Frequency: 5X/Week OT Frequency: 5X/Week            Contractures Contractures Info: Not present    Additional Factors Info  Code Status, Allergies, Insulin Sliding Scale Code Status Info: Full Allergies Info: Diovan (Valsartan), Lisinopril, Tape, Metolazone   Insulin Sliding Scale Info: See Med List       Current Medications (12/06/2020):  This is the current hospital active medication list Current Facility-Administered Medications  Medication Dose Route Frequency Provider Last Rate Last Admin   (feeding supplement) PROSource Plus liquid 30 mL  30 mL Oral BID BM Bensimhon, Shaune Pascal, MD   30 mL at 12/06/20 0822   0.9 %  sodium chloride infusion  250 mL Intravenous PRN Wynetta Fines T, MD       0.9 %  sodium chloride infusion  100  mL Intravenous PRN Elmarie Shiley, MD       0.9 %  sodium chloride infusion  100 mL Intravenous PRN Elmarie Shiley, MD       albuterol (PROVENTIL) (2.5 MG/3ML) 0.083% nebulizer solution 2.5 mg  2.5 mg Nebulization Daily PRN Wynetta Fines T, MD       alteplase (CATHFLO ACTIVASE) injection 2 mg  2 mg Intracatheter Once PRN Elmarie Shiley, MD       amiodarone (NEXTERONE PREMIX) 360-4.14 MG/200ML-% (1.8 mg/mL) IV infusion  30 mg/hr Intravenous Continuous  Lyndee Leo, RPH 16.67 mL/hr at 12/06/20 0912 30 mg/hr at 12/06/20 0912   B-complex with vitamin C tablet 1 tablet  1 tablet Oral Daily Bensimhon, Shaune Pascal, MD   1 tablet at 12/06/20 0938   calcitRIOL (ROCALTROL) capsule 0.25 mcg  0.25 mcg Oral Daily Wynetta Fines T, MD   0.25 mcg at 12/06/20 1829   calcium carbonate (TUMS - dosed in mg elemental calcium) chewable tablet 200 mg of elemental calcium  1 tablet Oral TID PRN Bensimhon, Shaune Pascal, MD       Chlorhexidine Gluconate Cloth 2 % PADS 6 each  6 each Topical Daily Lequita Halt, MD   6 each at 12/06/20 0825   clotrimazole (LOTRIMIN) 1 % cream 1 application  1 application Topical BID Lequita Halt, MD   1 application at 93/71/69 6789   cyclobenzaprine (FLEXERIL) tablet 10 mg  10 mg Oral TID PRN Lequita Halt, MD       estradiol (ESTRACE) tablet 1 mg  1 mg Oral Daily Wynetta Fines T, MD   1 mg at 12/06/20 3810   febuxostat (ULORIC) tablet 40 mg  40 mg Oral q AM Lequita Halt, MD   40 mg at 12/06/20 1751   ferrous sulfate tablet 325 mg  325 mg Oral q morning Wynetta Fines T, MD   325 mg at 12/06/20 0915   fluticasone (FLONASE) 50 MCG/ACT nasal spray 2 spray  2 spray Each Nare Daily Wynetta Fines T, MD   2 spray at 12/06/20 0915   fluticasone furoate-vilanterol (BREO ELLIPTA) 200-25 MCG/INH 1 puff  1 puff Inhalation Daily Wynetta Fines T, MD   1 puff at 12/06/20 0809   gabapentin (NEURONTIN) capsule 100 mg  100 mg Oral TID Wynetta Fines T, MD   100 mg at 12/06/20 0258   heparin ADULT infusion 100 units/mL (25000 units/256mL)  1,450 Units/hr Intravenous Continuous Bensimhon, Shaune Pascal, MD 14.5 mL/hr at 12/06/20 0700 1,450 Units/hr at 12/06/20 0700   heparin injection 1,000 Units  1,000 Units Dialysis PRN Elmarie Shiley, MD       heparin injection 4,700 Units  40 Units/kg Dialysis PRN Elmarie Shiley, MD       HYDROcodone-acetaminophen (NORCO/VICODIN) 5-325 MG per tablet 1 tablet  1 tablet Oral Q6H PRN Wynetta Fines T, MD       hydrocortisone cream 1 % 1 application  1  application Topical TID PRN Joette Catching, PA-C       insulin aspart (novoLOG) injection 0-6 Units  0-6 Units Subcutaneous TID WC Einar Grad, RPH   1 Units at 11/29/20 1636   levothyroxine (SYNTHROID) tablet 88 mcg  88 mcg Oral q morning Clegg, Amy D, NP   88 mcg at 12/06/20 0708   lidocaine (LIDODERM) 5 % 1 patch  1 patch Transdermal Q24H Lequita Halt, MD   1 patch at 12/05/20 0822   midodrine (PROAMATINE) tablet 15 mg  15 mg  Oral TID WC Joette Catching, PA-C   15 mg at 12/06/20 1112   milrinone (PRIMACOR) 20 MG/100 ML (0.2 mg/mL) infusion  0.25 mcg/kg/min Intravenous Continuous Bensimhon, Shaune Pascal, MD 9.39 mL/hr at 12/06/20 1202 0.25 mcg/kg/min at 12/06/20 1202   norepinephrine (LEVOPHED) 16 mg in 274mL premix infusion  0-20 mcg/min Intravenous Titrated Bensimhon, Shaune Pascal, MD   Stopped at 12/05/20 0519   ondansetron (ZOFRAN) injection 4 mg  4 mg Intravenous Q6H PRN Wynetta Fines T, MD   4 mg at 12/06/20 1112   oxyCODONE (Oxy IR/ROXICODONE) immediate release tablet 5 mg  5 mg Oral Q6H PRN Bensimhon, Shaune Pascal, MD       predniSONE (DELTASONE) tablet 5 mg  5 mg Oral Q breakfast Clegg, Amy D, NP   5 mg at 12/06/20 0708   senna-docusate (Senokot-S) tablet 2 tablet  2 tablet Oral BID Bensimhon, Shaune Pascal, MD   2 tablet at 12/02/20 2153   sodium chloride flush (NS) 0.9 % injection 10-40 mL  10-40 mL Intracatheter Q12H Chandrasekhar, Mahesh A, MD   10 mL at 12/06/20 0828   sodium chloride flush (NS) 0.9 % injection 10-40 mL  10-40 mL Intracatheter PRN Chandrasekhar, Mahesh A, MD       sodium chloride flush (NS) 0.9 % injection 3 mL  3 mL Intravenous Q12H Wynetta Fines T, MD   3 mL at 12/06/20 7096   sodium chloride flush (NS) 0.9 % injection 3 mL  3 mL Intravenous PRN Lequita Halt, MD   3 mL at 11/20/20 2359     Discharge Medications: Please see discharge summary for a list of discharge medications.  Relevant Imaging Results:  Relevant Lab Results:   Additional  Information SSN#: [id number] Vaccine X4 Pfizer 07/02/19, 07/23/19, 01/25/20, 08/17/20  Marijke [place], LCSWA

## 2020-12-07 DIAGNOSIS — N186 End stage renal disease: Secondary | ICD-10-CM | POA: Diagnosis not present

## 2020-12-07 DIAGNOSIS — R570 Cardiogenic shock: Secondary | ICD-10-CM | POA: Diagnosis not present

## 2020-12-07 DIAGNOSIS — I4891 Unspecified atrial fibrillation: Secondary | ICD-10-CM | POA: Diagnosis not present

## 2020-12-07 LAB — CBC
HCT: 28.7 % — ABNORMAL LOW (ref 36.0–46.0)
Hemoglobin: 9.2 g/dL — ABNORMAL LOW (ref 12.0–15.0)
MCH: 32.5 pg (ref 26.0–34.0)
MCHC: 32.1 g/dL (ref 30.0–36.0)
MCV: 101.4 fL — ABNORMAL HIGH (ref 80.0–100.0)
Platelets: 62 10*3/uL — ABNORMAL LOW (ref 150–400)
RBC: 2.83 MIL/uL — ABNORMAL LOW (ref 3.87–5.11)
RDW: 14.7 % (ref 11.5–15.5)
WBC: 7.7 10*3/uL (ref 4.0–10.5)
nRBC: 0 % (ref 0.0–0.2)

## 2020-12-07 LAB — BASIC METABOLIC PANEL
Anion gap: 15 (ref 5–15)
BUN: 53 mg/dL — ABNORMAL HIGH (ref 8–23)
CO2: 24 mmol/L (ref 22–32)
Calcium: 9.4 mg/dL (ref 8.9–10.3)
Chloride: 94 mmol/L — ABNORMAL LOW (ref 98–111)
Creatinine, Ser: 6.26 mg/dL — ABNORMAL HIGH (ref 0.44–1.00)
GFR, Estimated: 7 mL/min — ABNORMAL LOW (ref >60)
Glucose, Bld: 106 mg/dL — ABNORMAL HIGH (ref 70–99)
Potassium: 4.1 mmol/L (ref 3.5–5.1)
Sodium: 133 mmol/L — ABNORMAL LOW (ref 135–145)

## 2020-12-07 LAB — GLUCOSE, CAPILLARY
Glucose-Capillary: 110 mg/dL — ABNORMAL HIGH (ref 70–99)
Glucose-Capillary: 116 mg/dL — ABNORMAL HIGH (ref 70–99)
Glucose-Capillary: 126 mg/dL — ABNORMAL HIGH (ref 70–99)
Glucose-Capillary: 145 mg/dL — ABNORMAL HIGH (ref 70–99)

## 2020-12-07 LAB — APTT: aPTT: 54 seconds — ABNORMAL HIGH (ref 24–36)

## 2020-12-07 LAB — HEPARIN LEVEL (UNFRACTIONATED): Heparin Unfractionated: 0.66 IU/mL (ref 0.30–0.70)

## 2020-12-07 LAB — MAGNESIUM: Magnesium: 2.1 mg/dL (ref 1.7–2.4)

## 2020-12-07 LAB — HEPATITIS B SURFACE ANTIBODY, QUANTITATIVE: Hep B S AB Quant (Post): <3.1 m[IU]/mL — ABNORMAL LOW (ref >9.9)

## 2020-12-07 MED ORDER — ALTEPLASE 2 MG IJ SOLR
2.0000 mg | Freq: Once | INTRAMUSCULAR | Status: AC
  Administered 2020-12-07: 2 mg

## 2020-12-07 MED ORDER — AMIODARONE LOAD VIA INFUSION
150.0000 mg | Freq: Once | INTRAVENOUS | Status: AC
  Administered 2020-12-07: 150 mg via INTRAVENOUS

## 2020-12-07 MED ORDER — SODIUM CHLORIDE 0.9 % IV SOLN
0.0500 mg/kg/h | INTRAVENOUS | Status: DC
  Administered 2020-12-07: 0.05 mg/kg/h via INTRAVENOUS
  Filled 2020-12-07: qty 250

## 2020-12-07 NOTE — Progress Notes (Signed)
Inpatient Rehabilitation Admissions Coordinator   I met with patient at bedside. Disused her progress with dialysis tolerance. I will continue to follow.  Danne Baxter, RN, MSN Rehab Admissions Coordinator [phone] 12/07/2020 1:08 PM

## 2020-12-07 NOTE — Progress Notes (Addendum)
Advanced Heart Failure Rounding Note  PCP-Cardiologist: Shelva Majestic, MD  HF clinic: Dr. Haroldine Laws  Subjective:    Events: - 8/1 A fib RVR & A/C systolic heart faiure --> cardiogenic shock. Had urgent DC-CV with brief conversion to NSR but went back in A fib. . On Norepi + milrinone. AKI. Nephrology consulted.  - 8/2 A fib RVR, amio gtt. Continued on milrinone 0.375 mcg + norepi 2 mcg. Started on prednisone 40 mg daily x3 days for gout flare.  - 8/3 did not tolerate milrinone wean w/ drop in Co-ox 68%--->48% and decrease in UOP. Milrinone increased back to 0.375.  Lasix gtt increased to 30/hr.  - 8/4 NE added back given persistently low Co-ox - 8/4 s/p TEE/DCCV>>NSR  - 8/5 back in Afib w/ RVR  -  08/09 HD cath placed, CRRT started - 8/10: conversion to sinus tachycardia  - 8/13 CRRT stopped - 8/15 recurrent AF - 8/16 tolerated iHD  Off NE.  Milrinone dropped to 0.25 yesterday. No labs yet this am as ports clotted   Tolerated iHD yesterday without NE support. Able to get 2.5 L off. Weight down 6 pounds  Mild nausea today. No CP or SOB. Back in AF with RVR.  Objective:   Weight Range: 119.2 kg Body mass index is 45.82 kg/m.   Vital Signs:   Temp:  [97.7 F (36.5 C)-98.6 F (37 C)] 98 F (36.7 C) (08/16 2020) Pulse Rate:  [56-120] 120 (08/17 0700) Resp:  [17-31] 26 (08/17 0700) BP: (86-153)/(36-125) 113/66 (08/17 0700) SpO2:  [89 %-100 %] 97 % (08/17 0700) Weight:  [119.2 kg-121.7 kg] 119.2 kg (08/16 1910) Last BM Date: 12/03/20  Weight change: Filed Weights   12/06/20 0500 12/06/20 1600 12/06/20 1910  Weight: 121.7 kg 121.7 kg 119.2 kg    Intake/Output:   Intake/Output Summary (Last 24 hours) at 12/07/2020 0732 Last data filed at 12/06/2020 2300 Gross per 24 hour  Intake 1859.57 ml  Output 2500 ml  Net -640.43 ml      Physical Exam   General:  Sitting in chair. No resp difficulty Neck: supple. RIJ HD cath  Carotids 2+ bilat; no bruits. No  lymphadenopathy or thryomegaly appreciated. General:  Well appearing. No resp difficulty HEENT: normal Neck: supple. no JVD. Carotids 2+ bilat; no bruits. No lymphadenopathy or thryomegaly appreciated. Cor: PMI nondisplaced. irregular tachy No rubs, gallops or murmurs. Lungs: clear Abdomen: soft, nontender, nondistended. No hepatosplenomegaly. No bruits or masses. Good bowel sounds. Extremities: no cyanosis, clubbing, rash, 2+ edema + UNNA Neuro: alert & orientedx3, cranial nerves grossly intact. moves all 4 extremities w/o difficulty. Affect pleasant   Telemetry   AF 130s Personally reviewed  Labs    CBC Recent Labs    12/05/20 0403 12/06/20 0424  WBC 12.8* 9.6  HGB 9.7* 9.0*  HCT 30.5* 28.5*  MCV 102.7* 102.5*  PLT 121* 104*    Basic Metabolic Panel Recent Labs    12/05/20 0403 12/06/20 0424  NA 130* 128*  K 4.3 4.3  CL 97* 92*  CO2 21* 20*  GLUCOSE 102* 103*  BUN 53* 70*  CREATININE 5.84* 7.33*  CALCIUM 9.6 9.8  MG 2.2 2.1    Liver Function Tests No results for input(s): AST, ALT, ALKPHOS, BILITOT, PROT, ALBUMIN in the last 72 hours.  No results for input(s): LIPASE, AMYLASE in the last 72 hours. Cardiac Enzymes No results for input(s): CKTOTAL, CKMB, CKMBINDEX, TROPONINI in the last 72 hours.  BNP: BNP (last 3 results)  Recent Labs    11/20/20 0318 11/21/20 0258  BNP 2,051.3* 2,022.4*     ProBNP (last 3 results) No results for input(s): PROBNP in the last 8760 hours.   D-Dimer No results for input(s): DDIMER in the last 72 hours. Hemoglobin A1C No results for input(s): HGBA1C in the last 72 hours.  Fasting Lipid Panel No results for input(s): CHOL, HDL, LDLCALC, TRIG, CHOLHDL, LDLDIRECT in the last 72 hours. Thyroid Function Tests No results for input(s): TSH, T4TOTAL, T3FREE, THYROIDAB in the last 72 hours.  Invalid input(s): FREET3   Other results:   Imaging    No results found.   Medications:     Scheduled Medications:   (feeding supplement) PROSource Plus  30 mL Oral BID BM   B-complex with vitamin C  1 tablet Oral Daily   calcitRIOL  0.25 mcg Oral Daily   Chlorhexidine Gluconate Cloth  6 each Topical Daily   clotrimazole  1 application Topical BID   estradiol  1 mg Oral Daily   febuxostat  40 mg Oral q AM   ferrous sulfate  325 mg Oral q morning   fluticasone  2 spray Each Nare Daily   fluticasone furoate-vilanterol  1 puff Inhalation Daily   gabapentin  100 mg Oral TID   insulin aspart  0-6 Units Subcutaneous TID WC   levothyroxine  88 mcg Oral q morning   lidocaine  1 patch Transdermal Q24H   midodrine  15 mg Oral TID WC   predniSONE  5 mg Oral Q breakfast   senna-docusate  2 tablet Oral BID   sodium chloride flush  10-40 mL Intracatheter Q12H   sodium chloride flush  3 mL Intravenous Q12H    Infusions:  sodium chloride     sodium chloride     sodium chloride     amiodarone 30 mg/hr (12/06/20 2300)   heparin 1,450 Units/hr (12/06/20 2300)   milrinone 0.25 mcg/kg/min (12/06/20 2300)   norepinephrine (LEVOPHED) Adult infusion Stopped (12/05/20 0519)    PRN Medications: sodium chloride, sodium chloride, sodium chloride, acetaminophen, albuterol, alteplase, calcium carbonate, cyclobenzaprine, heparin, heparin, HYDROcodone-acetaminophen, hydrocortisone cream, ondansetron (ZOFRAN) IV, oxyCODONE, sodium chloride flush, sodium chloride flush    Patient Profile   Ms Hilgert is a 65 year old with a history of chronic systolic hf previously EF 45%,  NICM, hypothyroid, PAF, gout, OSA, CKD Stage IV, HTN, hyperlipidemia, and lupus.   Admitted with A fib RVR and A/C systolic heart failure complicated by cardiogenic shock.     Assessment/Plan   1. Acute/Chronic Systolic Heart Failure -> Cardiogenic shock in setting AF with RVR - 8/1 Lactic acid 1.5. CO-OX 43%.--> started on milrinone and NE - Echo 7/22 EF 30-35% (2021)  This admit -> 25-30%. - NE stopped 8/12, then resumed briefly the morning of  8/15 -- later weaned off.  Remains on milrinone 0.375. Co-ox 78% today. Can decrease milrinone to 0.25 today. - Had 27 pounds of volume removal 282 -> 255 with CVVHD. CVVHD stopped 8/13 - IHD started 8/16. Tolerated well. Weight 262 today - Unable to tolerate GDMT due to hypotension and AKI - Not candidate for advanced therapies with size and ESRD if unable to tolerate iHD off pressors would require Hospice evaluation - Resume NE as needed to maintain MAP of at least 70  - Palliative care consult    2. PAF--> Afib RVR - Developed shock and had urgent cardioversion 8/1 with conversion to NSR w/ ERAF - Repeat DCCV 8/4 w/ ERAF -  Conversion to sinus tachycardia on 8/10 with IV amio - In/out AF on 8/15 - Back n AF with RVR. Bolus IV amio. Continue amiodarone at 30 mg/hr while on inotropes  - unfortunately, not a candidate for ablation due to size if AF were to recur. Not candidate for AVN ablation and CRT. - Continue heparin gtt as patient will likely need additional HD access soon, but monitoring plt count with HIT pending  - Labs pending this am   3. AKI on CKD Stage IV -> ESRD - Baseline SCr ~3.2, now up to 5.84 -> 7.33 - Renal US with no acute findings - Off CRRT on 8/13. Volume status back up today. No response to high-dose lasix. Per nephrology, iHD on 8/16 -- will evaluate BP tolerated. If not, will need hospice consideration.  - Will keep in ICU while we are determining if she can tolerate iHD. Restart NE as needed to keep MAP >= 70 to preserve renal perfusion   4. OSA - Uses CPAP, but refused overnight    5. Hypothyroidism -TSH 8. T3 ok and T4 2.3  -On synthroid. Dose increased to 88 mcg -Repeat TSH in 6 weeks.    6. Gout - Uric acid 10. Pain RLE ? Flare  - On uloric 40 mg daily  - Completed 3 day course of prednisone    7. Hypokalemia -- now resolved - Labs pending today  8. Deconditioning - Continue PT/OT - needs HHPT. ? SNF   9. Hypervolemic hyponatremia --  improving - Labs pending - limit free H2O -- 1200 mL fluid restriction ordered  - HD pending again for tomorrow  10. Lupus - on low-dose prednisone.  11. Thrombocytopenia - Plt count of 121 -> 104 -- has steadily dropped since 8/11  - HIT panel sent  - No active bleeding noted   CRITICAL CARE Performed by: Glori Bickers  Total critical care time: 35 minutes  Critical care time was exclusive of separately billable procedures and treating other patients.  Critical care was necessary to treat or prevent imminent or life-threatening deterioration.  Critical care was time spent personally by me (independent of midlevel providers or residents) on the following activities: development of treatment plan with patient and/or surrogate as well as nursing, discussions with consultants, evaluation of patient's response to treatment, examination of patient, obtaining history from patient or surrogate, ordering and performing treatments and interventions, ordering and review of laboratory studies, ordering and review of radiographic studies, pulse oximetry and re-evaluation of patient's condition.    Glori Bickers, MD  12/07/2020, 7:32 AM  Advanced Heart Failure Team Pager [phone] (M-F; 7a - 5p)  Please contact [facility] for night-coverage after hours (5p -7a ) and weekends on [email or web address]

## 2020-12-07 NOTE — Progress Notes (Signed)
Patient ID: Caitlyn Fuentes, female   DOB: 1956-04-21, 65 y.o.   MRN: [id number] [facility] Progress Note   Assessment/ Plan:   1. Acute kidney Injury on chronic kidney disease stage IV: Transitioned from CRRT last week to intermittent hemodialysis yesterday-she tolerated this with 2.5 L ultrafiltration.  She has not had any meaningful renal recovery so far (this was anticipated given baseline chronic kidney disease and nature of cardiorenal syndrome).  Will order for next hemodialysis tomorrow and if she continues to tolerate this, request [facility] placement by interventional radiology.  Not on pressors. 2.  Acute exacerbation of congestive heart failure: She remains on milrinone for inotropic support/cardiogenic shock and is currently off of pressors.  Transitioning to IHD from CRRT for volume management. 3.  Atrial fibrillation with rapid ventricular response: RVR with decreased cardiac output likely contributing to renal hypoperfusion/dysfunction. 4.  Hyponatremia: Secondary to acute exacerbation of congestive heart failure/worsening renal function and impaired free water handling.  Reminded about fluid restriction. 5.  Anemia of chronic disease/chronic kidney disease: Iron stores are replete and she has been started on ESA.  Subjective:   Complains of some nausea this morning with poor appetite.  Denies any chest pain or abdominal pain.   Objective:   BP 113/66   Pulse (!) 120   Temp 98 F (36.7 C) (Oral)   Resp (!) 26   Ht 5' 3.5" (1.613 m)   Wt 119.2 kg   SpO2 97%   BMI 45.82 kg/m   Intake/Output Summary (Last 24 hours) at 12/07/2020 0709 Last data filed at 12/06/2020 2300 Gross per 24 hour  Intake 1859.57 ml  Output 2500 ml  Net -640.43 ml   Weight change: 0 kg  Physical Exam: Gen: Appears somewhat uncomfortable sitting up in recliner CVS: Pulse irregularly irregular tachycardia, S1 and S2 normal Resp: Clear to auscultation bilaterally without distinct rales or  rhonchi.  Right IJ temporary dialysis catheter. Abd: Soft, obese, nontender, bowel sounds normal Ext: 1-2+ bilateral lower extremity edema  Imaging: No results found.  Labs: BMET Recent Labs  Lab 11/30/20 1610 12/01/20 0410 12/01/20 1702 12/02/20 0412 12/02/20 1526 12/03/20 0450 12/04/20 0407 12/05/20 0403 12/06/20 0424  NA 134* 133* 133* 135 134* 132* 132*  132* 130* 128*  K 4.4 4.0 4.2 3.7 3.8 4.0 3.9  3.9 4.3 4.3  CL 100 97* 99 100 99 99 96*  96* 97* 92*  CO2 26 25 25 25 25 23 22  22  21* 20*  GLUCOSE 133* 117* 154* 103* 121* 88 100*  101* 102* 103*  BUN 17 16 17 14 17 15  29*  29* 53* 70*  CREATININE 1.42* 1.88* 1.83* 1.83* 2.17* 2.04* 3.76*  3.79* 5.84* 7.33*  CALCIUM 8.8* 9.5 9.3 9.5 9.0 9.3 10.0  10.0 9.6 9.8  PHOS 2.7 2.7 2.2* 2.3* 2.6 2.4* 4.1  --   --    CBC Recent Labs  Lab 12/03/20 0450 12/04/20 0407 12/05/20 0403 12/06/20 0424  WBC 9.3 11.9* 12.8* 9.6  HGB 11.3* 10.5* 9.7* 9.0*  HCT 36.5 33.1* 30.5* 28.5*  MCV 103.4* 102.5* 102.7* 102.5*  PLT 136* 132* 121* 104*    Medications:     (feeding supplement) PROSource Plus  30 mL Oral BID BM   B-complex with vitamin C  1 tablet Oral Daily   calcitRIOL  0.25 mcg Oral Daily   Chlorhexidine Gluconate Cloth  6 each Topical Daily   clotrimazole  1 application Topical BID   estradiol  1 mg  Oral Daily   febuxostat  40 mg Oral q AM   ferrous sulfate  325 mg Oral q morning   fluticasone  2 spray Each Nare Daily   fluticasone furoate-vilanterol  1 puff Inhalation Daily   gabapentin  100 mg Oral TID   insulin aspart  0-6 Units Subcutaneous TID WC   levothyroxine  88 mcg Oral q morning   lidocaine  1 patch Transdermal Q24H   midodrine  15 mg Oral TID WC   predniSONE  5 mg Oral Q breakfast   senna-docusate  2 tablet Oral BID   sodium chloride flush  10-40 mL Intracatheter Q12H   sodium chloride flush  3 mL Intravenous Q12H   Elmarie Shiley, MD 12/07/2020, 7:09 AM

## 2020-12-07 NOTE — Progress Notes (Signed)
ANTICOAGULATION CONSULT NOTE - Follow-Up Consult  Pharmacy Consult for Heparin>bivalirudin Indication: atrial fibrillation  Patient Measurements: Height: 5' 3.5" (161.3 cm) Weight: 109.6 kg (241 lb 10 oz) IBW/kg (Calculated) : 53.55 Heparin Dosing Weight: 85 kg  Vital Signs: BP: 113/66 (08/17 0700) Pulse Rate: 120 (08/17 0700)  Labs: Recent Labs    12/05/20 0403 12/06/20 0424 12/07/20 0706  HGB 9.7* 9.0* 9.2*  HCT 30.5* 28.5* 28.7*  PLT 121* 104* 62*  HEPARINUNFRC 0.51 0.59 0.66  CREATININE 5.84* 7.33* 6.26*     Estimated Creatinine Clearance: 10.9 mL/min (A) (by C-G formula based on SCr of 6.26 mg/dL (H)).   Medical History: Past Medical History:  Diagnosis Date   A-fib ([place])    Cardiomyopathy    Hyperlipidemia    Hypertension    Lupus ([place])    Pulmonary hypertension ([place]) 05/10/2011   Echo, EF-40-45   Renal disorder    Sleep apnea 2008   CPAP, pt does not know settings   Thyroid disease     Medications:     Assessment: 65 y.o. female with  h/o Afib, DOAC on hold, for heparin.  Pt was on Xarelto PTA and changed to Eliquis due to procedures.  Eliquis 5 mg last given at 2317 7/31.   Heparin level therapeutic this morning on 1450 unit/h. Hgb stable overnight 9.2, plt count also trending down more from 104 to 62 overnight. Hit antibody negative (0.087). Discussed with primary team given significant decline in platelet count will go ahead and change over to bival and send out a SRA which will take several days to result.   Goal of Therapy:  Aptt goal 50-85s Monitor platelets by anticoagulation protocol: Yes   Plan:  Transition from heparin to bival 0.05mg /kg/hr Follow-up transition back to [place] after procedures are complete HD planned for tomorrow Send and Follow up [place] PharmD., BCPS Clinical Pharmacist 12/07/2020 9:33 AM

## 2020-12-07 NOTE — Progress Notes (Signed)
ANTICOAGULATION CONSULT NOTE - Follow-Up Consult  Pharmacy Consult for Heparin>bivalirudin Indication: atrial fibrillation  Patient Measurements: Height: 5' 3.5" (161.3 cm) Weight: 109.6 kg (241 lb 10 oz) IBW/kg (Calculated) : 53.55 Heparin Dosing Weight: 85 kg  Vital Signs: Temp: 97.6 F (36.4 C) (08/17 1106) Temp Source: Oral (08/17 1106) BP: 96/57 (08/17 1800) Pulse Rate: 122 (08/17 1800)  Labs: Recent Labs    12/05/20 0403 12/06/20 0424 12/07/20 0706 12/07/20 1600  HGB 9.7* 9.0* 9.2*  --   HCT 30.5* 28.5* 28.7*  --   PLT 121* 104* 62*  --   APTT  --   --   --  54*  HEPARINUNFRC 0.51 0.59 0.66  --   CREATININE 5.84* 7.33* 6.26*  --      Estimated Creatinine Clearance: 10.9 mL/min (A) (by C-G formula based on SCr of 6.26 mg/dL (H)).   Medical History: Past Medical History:  Diagnosis Date   A-fib ([place])    Cardiomyopathy    Hyperlipidemia    Hypertension    Lupus ([place])    Pulmonary hypertension ([place]) 05/10/2011   Echo, EF-40-45   Renal disorder    Sleep apnea 2008   CPAP, pt does not know settings   Thyroid disease     Medications:     Assessment: 65 y.o. female with  h/o Afib, DOAC on hold, for heparin.  Pt was on Xarelto PTA and changed to Eliquis due to procedures.  Eliquis 5 mg last given at 2317 7/31.   Hit antibody negative (0.087). 8/17 Discussed with primary team given significant decline in platelet count will go ahead and change over to bival and send out a SRA which will take several days to result.   APTT 54 is therapeutic.   Goal of Therapy:  Aptt goal 50-85s Monitor platelets by anticoagulation protocol: Yes   Plan:  Continue bival 0.05mg /kg/hr Monitor daily aPTT, CBC/plt Monitor for signs/symptoms of bleeding  Follow-up transition back to [place] after procedures are complete HD planned for tomorrow Follow up [place], PharmD, BCPS, [place] Pharmacist  Please check AMION for all [place] phone numbers After  10:00 PM, call [place]

## 2020-12-07 NOTE — Telephone Encounter (Signed)
Call was returned to the patient. She requests for her DME company be switched to [employer] in [place]. I spoke with the representative there and she states that the patient is already set up with them for other services and transferring her CPAP management to them will not be a problem. If they need any additional documentation they will call me to advise.

## 2020-12-07 NOTE — Progress Notes (Signed)
Physical Therapy Treatment Patient Details Name: Caitlyn Fuentes MRN: [id number] DOB: May 27, 1955 Today's Date: 12/07/2020    History of Present Illness Pt is a 65 y/o female presenting on 11/20/20 with worsening palpitations, SOB and LE edema. Pt found in rapid a fib, chest x-ray with significant vascular congestion. 8/1 hypotensive with cardiogenic shock, transferred to ICU and urgent cardioversion.   S/P TEE and cardioversion 8/4. CRRT started 8/8. PMH includes: afib, cardiomyopathy, HTN, lupus, PAF, CKD.    PT Comments    Pt is eager to participate, quick to fatigue and perceives exertion at "12/10".  Emphasis on sit to stands/safety, progression of gait stability/stamina.  HR labile in afib with ranges during activity from low 130's to mid 140's sustained to max spike to 161 bpm.    Follow Up Recommendations  Other (comment);CIR;Supervision/Assistance - 24 hour (TBA further based on pt's kidney function.)     Equipment Recommendations  Other (comment)    Recommendations for Other Services Rehab consult     Precautions / Restrictions Precautions Precautions: Fall Precaution Comments: watch HR--afib    Mobility  Bed Mobility               General bed mobility comments: OOB in recliner    Transfers Overall transfer level: Needs assistance   Transfers: Sit to/from Stand Sit to Stand: Min guard (x4)         General transfer comment: cues for hand placement/safety, no assist from recliner level  Ambulation/Gait Ambulation/Gait assistance: Min guard Gait Distance (Feet): 210 Feet (with 3 sitting rests of approx 70 feet each) Assistive device: Rolling walker (2 wheeled) Gait Pattern/deviations: Step-through pattern;Decreased stride length   Gait velocity interpretation: <1.8 ft/sec, indicate of risk for recurrent falls General Gait Details: generally steady witht he RW, EHR in afib from low 130's to high 140's sustained with spikes in the 150's and low 160's  max. Quick to recover in sitting back to low 130's/upper 120's.  SpO2 98% or above, RR upper 20's..  All on 2L O2NC   Stairs             Wheelchair Mobility    Modified Rankin (Stroke Patients Only)       Balance     Sitting balance-Leahy Scale: Fair     Standing balance support: Single extremity supported;No upper extremity supported;During functional activity Standing balance-Leahy Scale: Fair                              Cognition Arousal/Alertness: Awake/alert Behavior During Therapy: WFL for tasks assessed/performed Overall Cognitive Status: Within Functional Limits for tasks assessed                                        Exercises      General Comments        Pertinent Vitals/Pain Pain Assessment: Faces Faces Pain Scale: No hurt Pain Intervention(s): Monitored during session    Home Living                      Prior Function            PT Goals (current goals can now be found in the care plan section) Acute Rehab PT Goals Patient Stated Goal: to get better PT Goal Formulation: With patient Time For Goal Achievement: 12/07/20 Potential to Achieve Goals:  Fair Progress towards PT goals: Progressing toward goals    Frequency    Min 3X/week      PT Plan Current plan remains appropriate    Co-evaluation              AM-PAC PT "6 Clicks" Mobility   Outcome Measure  Help needed turning from your back to your side while in a flat bed without using bedrails?: A Little Help needed moving from lying on your back to sitting on the side of a flat bed without using bedrails?: A Little Help needed moving to and from a bed to a chair (including a wheelchair)?: A Little Help needed standing up from a chair using your arms (e.g., wheelchair or bedside chair)?: A Little Help needed to walk in hospital room?: A Little Help needed climbing 3-5 steps with a railing? : A Lot 6 Click Score: 17    End of  Session Equipment Utilized During Treatment: Oxygen Activity Tolerance: Patient tolerated treatment well;Patient limited by fatigue Patient left: in chair;with call bell/phone within reach Nurse Communication: Mobility status PT Visit Diagnosis: Other abnormalities of gait and mobility (R26.89);Muscle weakness (generalized) (M62.81);Difficulty in walking, not elsewhere classified (R26.2)     Time: 1610-9604 PT Time Calculation (min) (ACUTE ONLY): 35 min  Charges:  $Gait Training: 8-22 mins $Therapeutic Activity: 8-22 mins                     12/07/2020  Caitlyn Fuentes., PT Acute Rehabilitation Services [phone]  (pager) [phone]  (office)   Caitlyn Fuentes Caitlyn Fuentes 12/07/2020, 5:03 PM

## 2020-12-07 NOTE — Progress Notes (Signed)
Orthopedic Tech Progress Note Patient Details:  Caitlyn Fuentes 1955/10/06 [id number]  Removed old pair, washed legs, added a little lotion before applying new UNNA BOOTS,   Ortho Devices Type of Ortho Device: Haematologist Ortho Device/Splint Location: BLE Ortho Device/Splint Interventions: Ordered, Removal, Application   Post Interventions Patient Tolerated: Well Instructions Provided: Care of [place] 12/07/2020, 5:33 PM

## 2020-12-08 DIAGNOSIS — N179 Acute kidney failure, unspecified: Secondary | ICD-10-CM | POA: Diagnosis not present

## 2020-12-08 DIAGNOSIS — Z515 Encounter for palliative care: Secondary | ICD-10-CM

## 2020-12-08 DIAGNOSIS — I5023 Acute on chronic systolic (congestive) heart failure: Secondary | ICD-10-CM | POA: Diagnosis not present

## 2020-12-08 DIAGNOSIS — I4891 Unspecified atrial fibrillation: Secondary | ICD-10-CM | POA: Diagnosis not present

## 2020-12-08 DIAGNOSIS — Z7189 Other specified counseling: Secondary | ICD-10-CM

## 2020-12-08 DIAGNOSIS — R570 Cardiogenic shock: Secondary | ICD-10-CM | POA: Diagnosis not present

## 2020-12-08 LAB — BASIC METABOLIC PANEL
Anion gap: 16 — ABNORMAL HIGH (ref 5–15)
BUN: 65 mg/dL — ABNORMAL HIGH (ref 8–23)
CO2: 21 mmol/L — ABNORMAL LOW (ref 22–32)
Calcium: 9.4 mg/dL (ref 8.9–10.3)
Chloride: 93 mmol/L — ABNORMAL LOW (ref 98–111)
Creatinine, Ser: 7.76 mg/dL — ABNORMAL HIGH (ref 0.44–1.00)
GFR, Estimated: 5 mL/min — ABNORMAL LOW (ref >60)
Glucose, Bld: 111 mg/dL — ABNORMAL HIGH (ref 70–99)
Potassium: 3.9 mmol/L (ref 3.5–5.1)
Sodium: 130 mmol/L — ABNORMAL LOW (ref 135–145)

## 2020-12-08 LAB — CBC
HCT: 27.3 % — ABNORMAL LOW (ref 36.0–46.0)
Hemoglobin: 8.4 g/dL — ABNORMAL LOW (ref 12.0–15.0)
MCH: 32.2 pg (ref 26.0–34.0)
MCHC: 30.8 g/dL (ref 30.0–36.0)
MCV: 104.6 fL — ABNORMAL HIGH (ref 80.0–100.0)
Platelets: 73 10*3/uL — ABNORMAL LOW (ref 150–400)
RBC: 2.61 MIL/uL — ABNORMAL LOW (ref 3.87–5.11)
RDW: 15.2 % (ref 11.5–15.5)
WBC: 5.3 10*3/uL (ref 4.0–10.5)
nRBC: 0.6 % — ABNORMAL HIGH (ref 0.0–0.2)

## 2020-12-08 LAB — COOXEMETRY PANEL
Carboxyhemoglobin: 2.1 % — ABNORMAL HIGH (ref 0.5–1.5)
Methemoglobin: 0.9 % (ref 0.0–1.5)
O2 Saturation: 83.8 %
Total hemoglobin: 8.7 g/dL — ABNORMAL LOW (ref 12.0–16.0)

## 2020-12-08 LAB — GLUCOSE, CAPILLARY
Glucose-Capillary: 111 mg/dL — ABNORMAL HIGH (ref 70–99)
Glucose-Capillary: 129 mg/dL — ABNORMAL HIGH (ref 70–99)
Glucose-Capillary: 147 mg/dL — ABNORMAL HIGH (ref 70–99)
Glucose-Capillary: 129 mg/dL — ABNORMAL HIGH (ref 70–99)

## 2020-12-08 LAB — APTT
aPTT: 80 seconds — ABNORMAL HIGH (ref 24–36)
aPTT: 82 seconds — ABNORMAL HIGH (ref 24–36)

## 2020-12-08 LAB — MAGNESIUM: Magnesium: 2.1 mg/dL (ref 1.7–2.4)

## 2020-12-08 MED ORDER — MILRINONE LACTATE IN DEXTROSE 20-5 MG/100ML-% IV SOLN
0.1250 ug/kg/min | INTRAVENOUS | Status: DC
  Administered 2020-12-08: 0.125 ug/kg/min via INTRAVENOUS
  Filled 2020-12-08: qty 100

## 2020-12-08 MED ORDER — AMIODARONE LOAD VIA INFUSION
150.0000 mg | Freq: Once | INTRAVENOUS | Status: AC
  Administered 2020-12-08: 150 mg via INTRAVENOUS
  Filled 2020-12-08 (×3): qty 83.34

## 2020-12-08 MED ORDER — SODIUM CHLORIDE 0.9 % IV SOLN
0.0300 mg/kg/h | INTRAVENOUS | Status: DC
  Administered 2020-12-08: 0.04 mg/kg/h via INTRAVENOUS
  Administered 2020-12-09 – 2020-12-12 (×2): 0.03 mg/kg/h via INTRAVENOUS
  Filled 2020-12-08 (×2): qty 250

## 2020-12-08 MED ORDER — ANTICOAGULANT SODIUM CITRATE 4% (200MG/5ML) IV SOLN
5.0000 mL | Freq: Once | Status: AC
  Administered 2020-12-09: 5 mL
  Filled 2020-12-08: qty 5

## 2020-12-08 MED ORDER — HEPARIN SODIUM (PORCINE) 1000 UNIT/ML IJ SOLN
INTRAMUSCULAR | Status: AC
  Filled 2020-12-08: qty 4

## 2020-12-08 MED ORDER — RENA-VITE PO TABS
1.0000 | ORAL_TABLET | Freq: Every day | ORAL | Status: DC
  Administered 2020-12-08 – 2020-12-16 (×9): 1 via ORAL
  Filled 2020-12-08 (×9): qty 1

## 2020-12-08 MED ORDER — SODIUM CHLORIDE 0.9 % IV SOLN
0.0400 mg/kg/h | INTRAVENOUS | Status: DC
  Filled 2020-12-08: qty 250

## 2020-12-08 NOTE — Plan of Care (Signed)
  Problem: Education: Goal: Ability to demonstrate management of disease process will improve Outcome: Progressing Goal: Ability to verbalize understanding of medication therapies will improve Outcome: Progressing   Problem: Activity: Goal: Capacity to carry out activities will improve Outcome: Progressing   Problem: Cardiac: Goal: Ability to achieve and maintain adequate cardiopulmonary perfusion will improve Outcome: Progressing   Problem: Education: Goal: Knowledge of General Education information will improve Description: Including pain rating scale, medication(s)/side effects and non-pharmacologic comfort measures Outcome: Progressing   Problem: Health Behavior/Discharge Planning: Goal: Ability to manage health-related needs will improve Outcome: Progressing   Problem: Clinical Measurements: Goal: Ability to maintain clinical measurements within normal limits will improve Outcome: Progressing Goal: Will remain free from infection Outcome: Progressing Goal: Diagnostic test results will improve Outcome: Progressing Goal: Respiratory complications will improve Outcome: Progressing Goal: Cardiovascular complication will be avoided Outcome: Progressing   Problem: Activity: Goal: Risk for activity intolerance will decrease Outcome: Progressing   Problem: Nutrition: Goal: Adequate nutrition will be maintained Outcome: Progressing   Problem: Elimination: Goal: Will not experience complications related to bowel motility Outcome: Progressing Goal: Will not experience complications related to urinary retention Outcome: Progressing

## 2020-12-08 NOTE — Progress Notes (Signed)
OT Cancellation Note  Patient Details Name: Caitlyn Fuentes MRN: [id number] DOB: [birth year]   Cancelled Treatment:    Reason Eval/Treat Not Completed: Other (comment)- requesting to hold until after HD.  Will follow and see as able.   Jolaine Artist, OT Acute Rehabilitation Services Pager [phone] Office [phone]   Caitlyn Fuentes 12/08/2020, 12:26 PM

## 2020-12-08 NOTE — Progress Notes (Signed)
Pt has home CPAP.  

## 2020-12-08 NOTE — Progress Notes (Signed)
This chaplain responded to PMT consult for Pt. prayer and positive thoughts before dialysis.    The chaplain listened reflectively as the Pt. shared her faith and personal response to the upcoming medical procedure. The chaplain understands the Pt. before today had not thought about her heart's reaction to dialysis. The chaplain understands a pastoral presence gave the Pt. the opportunity to witness to the strength and trust in her stories and lean on God's love.  The Pt. accepted the chaplain's invitation for prayer and F/U spiritual care.

## 2020-12-08 NOTE — Progress Notes (Signed)
ANTICOAGULATION CONSULT NOTE - Follow-Up Consult  Pharmacy Consult for Heparin>bivalirudin Indication: atrial fibrillation  Patient Measurements: Height: 5' 3.5" (161.3 cm) Weight: 123 kg (271 lb 2.7 oz) IBW/kg (Calculated) : 53.55 Heparin Dosing Weight: 85 kg  Vital Signs: Temp: 98.4 F (36.9 C) (08/18 0430) Temp Source: Oral (08/18 0430) BP: 98/61 (08/18 0600) Pulse Rate: 124 (08/18 0600)  Labs: Recent Labs    12/06/20 0424 12/07/20 0706 12/07/20 1600 12/08/20 0543  HGB 9.0* 9.2*  --  8.4*  HCT 28.5* 28.7*  --  27.3*  PLT 104* 62*  --  73*  APTT  --   --  54* 80*  HEPARINUNFRC 0.59 0.66  --   --   CREATININE 7.33* 6.26*  --   --      Estimated Creatinine Clearance: 11.7 mL/min (A) (by C-G formula based on SCr of 6.26 mg/dL (H)).   Medical History: Past Medical History:  Diagnosis Date   A-fib ([place])    Cardiomyopathy    Hyperlipidemia    Hypertension    Lupus ([place])    Pulmonary hypertension ([place]) 05/10/2011   Echo, EF-40-45   Renal disorder    Sleep apnea 2008   CPAP, pt does not know settings   Thyroid disease     Medications:     Assessment: 65 y.o. female with  h/o Afib, DOAC on hold, for heparin.  Pt was on Xarelto PTA and changed to Eliquis due to procedures.  Eliquis 5 mg last given at 2317 7/31.   Hit antibody negative (0.087). 8/17 Discussed with primary team given significant decline in platelet count will go ahead and change over to bival and send out a SRA which will take several days to result.   APTT 54 is therapeutic.   8/18 AM update: aPTT therapeutic x 2  Goal of Therapy:  Aptt goal 50-85s Monitor platelets by anticoagulation protocol: Yes   Plan:  Continue bival 0.05mg /kg/hr Daily aPTT and CBC Follow up [place], PharmD, [place] Pharmacist Phone: [phone]

## 2020-12-08 NOTE — Progress Notes (Signed)
Advanced Heart Failure Rounding Note  PCP-Cardiologist: Shelva Majestic, MD  HF clinic: Dr. Haroldine Laws  Subjective:    Events: - 8/1 A fib RVR & A/C systolic heart faiure --> cardiogenic shock. Had urgent DC-CV with brief conversion to NSR but went back in A fib. . On Norepi + milrinone. AKI. Nephrology consulted.  - 8/2 A fib RVR, amio gtt. Continued on milrinone 0.375 mcg + norepi 2 mcg. Started on prednisone 40 mg daily x3 days for gout flare.  - 8/3 did not tolerate milrinone wean w/ drop in Co-ox 68%--->48% and decrease in UOP. Milrinone increased back to 0.375.  Lasix gtt increased to 30/hr.  - 8/4 NE added back given persistently low Co-ox - 8/4 s/p TEE/DCCV>>NSR  - 8/5 back in Afib w/ RVR  -  08/09 HD cath placed, CRRT started - 8/10: conversion to sinus tachycardia  - 8/13 CRRT stopped - 8/15 recurrent AF - 8/16 tolerated iHD  Off NE.  Milrinone dropped to 0.25 yesterday. No labs yet this am as ports clotted   Tolerated iHD yesterday without NE support. Able to get 2.5 L off. Weight down 6 pounds  Mild nausea today. No CP or SOB. Back in AF with RVR.  Objective:   Weight Range: 123 kg Body mass index is 47.28 kg/m.   Vital Signs:   Temp:  [97.6 F (36.4 C)-98.6 F (37 C)] 97.8 F (36.6 C) (08/18 0800) Pulse Rate:  [63-167] 129 (08/18 0700) Resp:  [15-31] 31 (08/18 0700) BP: (74-128)/(46-104) 128/104 (08/18 0700) SpO2:  [88 %-100 %] 99 % (08/18 0757) Weight:  [123 kg] 123 kg (08/18 0500) Last BM Date: 12/07/20  Weight change: Filed Weights   12/06/20 1910 12/07/20 0500 12/08/20 0500  Weight: 119.2 kg 109.6 kg 123 kg    Intake/Output:   Intake/Output Summary (Last 24 hours) at 12/08/2020 0921 Last data filed at 12/08/2020 0654 Gross per 24 hour  Intake 1511.32 ml  Output 300 ml  Net 1211.32 ml     Physical Exam   General:  Sitting in chair. No resp difficulty Neck: supple. RIJ HD cath  Carotids 2+ bilat; no bruits. No lymphadenopathy or  thryomegaly appreciated. General:  Well appearing. No resp difficulty HEENT: normal Neck: supple. no JVD. Carotids 2+ bilat; no bruits. No lymphadenopathy or thryomegaly appreciated. Cor: PMI nondisplaced. irregular tachy No rubs, gallops or murmurs. Lungs: clear Abdomen: soft, nontender, nondistended. No hepatosplenomegaly. No bruits or masses. Good bowel sounds. Extremities: no cyanosis, clubbing, rash, 2+ edema + UNNA Neuro: alert & orientedx3, cranial nerves grossly intact. moves all 4 extremities w/o difficulty. Affect pleasant   Telemetry   AF 130s Personally reviewed  Labs    CBC Recent Labs    12/07/20 0706 12/08/20 0543  WBC 7.7 5.3  HGB 9.2* 8.4*  HCT 28.7* 27.3*  MCV 101.4* 104.6*  PLT 62* 73*   Basic Metabolic Panel Recent Labs    12/07/20 0706 12/08/20 0543  NA 133* 130*  K 4.1 3.9  CL 94* 93*  CO2 24 21*  GLUCOSE 106* 111*  BUN 53* 65*  CREATININE 6.26* 7.76*  CALCIUM 9.4 9.4  MG 2.1 2.1   Liver Function Tests No results for input(s): AST, ALT, ALKPHOS, BILITOT, PROT, ALBUMIN in the last 72 hours.  No results for input(s): LIPASE, AMYLASE in the last 72 hours. Cardiac Enzymes No results for input(s): CKTOTAL, CKMB, CKMBINDEX, TROPONINI in the last 72 hours.  BNP: BNP (last 3 results) Recent Labs  11/20/20 0318 11/21/20 0258  BNP 2,051.3* 2,022.4*    ProBNP (last 3 results) No results for input(s): PROBNP in the last 8760 hours.   D-Dimer No results for input(s): DDIMER in the last 72 hours. Hemoglobin A1C No results for input(s): HGBA1C in the last 72 hours.  Fasting Lipid Panel No results for input(s): CHOL, HDL, LDLCALC, TRIG, CHOLHDL, LDLDIRECT in the last 72 hours. Thyroid Function Tests No results for input(s): TSH, T4TOTAL, T3FREE, THYROIDAB in the last 72 hours.  Invalid input(s): FREET3   Other results:   Imaging    No results found.   Medications:     Scheduled Medications:  heparin sodium (porcine)        (feeding supplement) PROSource Plus  30 mL Oral BID BM   B-complex with vitamin C  1 tablet Oral Daily   calcitRIOL  0.25 mcg Oral Daily   Chlorhexidine Gluconate Cloth  6 each Topical Daily   clotrimazole  1 application Topical BID   estradiol  1 mg Oral Daily   febuxostat  40 mg Oral q AM   ferrous sulfate  325 mg Oral q morning   fluticasone  2 spray Each Nare Daily   fluticasone furoate-vilanterol  1 puff Inhalation Daily   gabapentin  100 mg Oral TID   insulin aspart  0-6 Units Subcutaneous TID WC   levothyroxine  88 mcg Oral q morning   lidocaine  1 patch Transdermal Q24H   midodrine  15 mg Oral TID WC   predniSONE  5 mg Oral Q breakfast   senna-docusate  2 tablet Oral BID   sodium chloride flush  10-40 mL Intracatheter Q12H   sodium chloride flush  3 mL Intravenous Q12H    Infusions:  sodium chloride     sodium chloride     sodium chloride     amiodarone 30 mg/hr (12/08/20 0600)   bivalirudin (ANGIOMAX) infusion 0.5 mg/mL (Non-ACS indications) 0.05 mg/kg/hr (12/08/20 0600)   milrinone 0.25 mcg/kg/min (12/08/20 0654)   norepinephrine (LEVOPHED) Adult infusion Stopped (12/05/20 0519)    PRN Medications: sodium chloride, sodium chloride, sodium chloride, acetaminophen, albuterol, alteplase, calcium carbonate, cyclobenzaprine, HYDROcodone-acetaminophen, hydrocortisone cream, ondansetron (ZOFRAN) IV, oxyCODONE, sodium chloride flush, sodium chloride flush    Patient Profile   Caitlyn Fuentes is a 65 year old with a history of chronic systolic hf previously EF 45%,  NICM, hypothyroid, PAF, gout, OSA, CKD Stage IV, HTN, hyperlipidemia, and lupus.   Admitted with A fib RVR and A/C systolic heart failure complicated by cardiogenic shock.     Assessment/Plan   1. Acute/Chronic Systolic Heart Failure -> Cardiogenic shock in setting AF with RVR - 8/1 Lactic acid 1.5. CO-OX 43%.--> started on milrinone and NE - Echo 7/22 EF 30-35% (2021)  This admit -> 25-30%. - NE stopped  8/12, then resumed briefly the morning of 8/15 -- later weaned off.  - CO-OX  84%. Repeat CO-OX. Cut back milrinone to 0.125 mcg.  -CVVHD stopped 8/13. Tolerating iHD.  - Unable to tolerate GDMT due to hypotension and AKI - Not candidate for advanced therapies with size and ESRD if unable to tolerate iHD off pressors would require Hospice evaluation -- Palliative care consult    2. PAF--> Afib RVR - Developed shock and had urgent cardioversion 8/1 with conversion to NSR w/ ERAF - Repeat DCCV 8/4 w/ ERAF - Conversion to sinus tachycardia on 8/10 with IV amio - Remains in A fib since 8/17 . Give 150 mg bolus amio and  amio drip.  - unfortunately, not a candidate for ablation due to size if AF were to recur. Not candidate for AVN ablation and CRT. - Yesterday plt down so she was switched to Bival. Platelets trending up.  - SRA pending.      3. AKI on CKD Stage IV -> ESRD - Baseline SCr ~3.2, now up to 5.84 -> 7.33 - Renal US with no acute findings - Off CRRT on 8/13. Volume status back up today. No response to high-dose lasix. Per nephrology, iHD on 8/16 -- will evaluate BP tolerated. If not, will need hospice consideration.  - Will keep in ICU while we are determining if she can tolerate iHD. Restart NE as needed to keep MAP >= 70 to preserve renal perfusion   4. OSA - Uses CPAP, but refused overnight    5. Hypothyroidism -TSH 8. T3 ok and T4 2.3  -On synthroid. Dose increased to 88 mcg -Repeat TSH in 6 weeks.    6. Gout - Uric acid 10. Pain RLE ? Flare  - On uloric 40 mg daily  - Completed 3 day course of prednisone    7. Hypokalemia -- now resolved -Stable.   8. Deconditioning - Continue PT/OT--> CIR following.  - needs HHPT. ? SNF   9. Hypervolemic hyponatremia -- improving - Sodium 130 today.  - limit free H2O -- 1200 mL fluid restriction ordered  -   10. Lupus - on low-dose prednisone.  11. Thrombocytopenia - Plt count of 121 -> 104 -- >62 switched from heparin  drip to bival-->73 today  - HIT panel negative.  - SRA pending.  - No active bleeding noted     Darrick Grinder, NP  12/08/2020, 9:21 AM  Advanced Heart Failure Team Pager [phone] (M-F; 7a - 5p)  Please contact [facility] for night-coverage after hours (5p -7a ) and weekends on [email or web address]  Agree with above.   She remains in AF with RVR. On milrinone 0.25. Off NE. Co-ox ok. Fluid re-accumulating off CVVHD. For iHD today.  No CP or SOB. BP soft.   General:  Lying in bed No resp difficulty HEENT: normal Neck: supple. JVP to jaw. HD cath Carotids 2+ bilat; no bruits. No lymphadenopathy or thryomegaly appreciated. Cor: PMI nondisplaced. Irregular tachy  No rubs, gallops or murmurs. Lungs: clear Abdomen: soft, nontender, nondistended. No hepatosplenomegaly. No bruits or masses. Good bowel sounds. Extremities: no cyanosis, clubbing, rash, 3+ edema + UNNA  Neuro: alert & orientedx3, cranial nerves grossly intact. moves all 4 extremities w/o difficulty. Affect pleasant  We are struggling to manage her off CVVHD.  Soft BPs and recurrent AF have been complicating issues.   Will plan iHD again today. If unable to manage volume status may need to move toward Hospice.   Decrease milrinone to 0.125. Increase amio to 60. Continue bival.     CRITICAL CARE Performed by: Caitlyn Fuentes  Total critical care time: 35 minutes  Critical care time was exclusive of separately billable procedures and treating other patients.  Critical care was necessary to treat or prevent imminent or life-threatening deterioration.  Critical care was time spent personally by me (independent of midlevel providers or residents) on the following activities: development of treatment plan with patient and/or surrogate as well as nursing, discussions with consultants, evaluation of patient's response to treatment, examination of patient, obtaining history from patient or surrogate, ordering and performing treatments  and interventions, ordering and review of laboratory studies, ordering and review of radiographic studies, pulse  oximetry and re-evaluation of patient's condition.   Caitlyn Bickers, MD  9:45 AM

## 2020-12-08 NOTE — Progress Notes (Signed)
   Remains in A fib RVR with rates in the 130-140s  Milrinone cut back to 0.125 mcg and amio increased to 60 mg per hour. Give 150 mg IV bolus now.    Paolo Okane NP-C  10:52 AM

## 2020-12-08 NOTE — Progress Notes (Signed)
Nutrition Follow Up  DOCUMENTATION CODES:   Not applicable  INTERVENTION:   Continue ProSource Plus 30 ml BID, each supplement provides 100 kcals and 15 grams protein.  Transition to Renal MVI  NUTRITION DIAGNOSIS:   Increased nutrient needs related to acute illness as evidenced by estimated needs.  Ongoing   GOAL:   Patient will meet greater than or equal to 90% of their needs  Meeting   MONITOR:   PO intake, Supplement acceptance, Weight trends, I & O's, Labs  REASON FOR ASSESSMENT:   Rounds    ASSESSMENT:   Patient with PMH significant for cardiomyopathy, HLD, HTN, pulmonary HTN, COPD, CKD IV, and gout. Presents this admission with CHF exacerbation.  8/01- afib RVR, cardiogenic shock, had urgent DCCV 8/04- s/p TEE, DCCV 8/08- start CRRT 8/13- stop CRRT 8/16- tolerated iHD  Plan iHD today. Off pressors. Experience nausea this am, but has now resolved. Appetite remains stable. Last four meals documented as 100%, 25%, 80%, and 100%. If appetite declines, recommend transition to complete oral nutrition supplement.   Admission weight: 123.3 kg  Current weight: 123 kg   UOP: 300 ml x 24 hrs   Medications: SS novolog, prednisone, senokot Labs: Na 130 (L) CBG 116-145  Diet Order:   Diet Order             Diet renal with fluid restriction Fluid restriction: 1200 mL Fluid; Room service appropriate? Yes; Fluid consistency: Thin  Diet effective now                   EDUCATION NEEDS:   Education needs have been addressed  Skin:  Skin Assessment: Reviewed RN Assessment  Last BM:  8/17  Height:   Ht Readings from Last 1 Encounters:  11/20/20 5' 3.5" (1.613 m)    Weight:   Wt Readings from Last 1 Encounters:  12/08/20 123 kg    BMI:  Body mass index is 47.28 kg/m.  Estimated Nutritional Needs:   Kcal:  1800-2000 kcal  Protein:  95-115 grams  Fluid:  >/= 1.8 L/day   Mariana Single MS, RD, LDN, CNSC Clinical Nutrition Pager listed in  [place]

## 2020-12-08 NOTE — Progress Notes (Signed)
Patient ID: Caitlyn Fuentes, female   DOB: April 16, 1956, 65 y.o.   MRN: [id number] [facility] Progress Note   Assessment/ Plan:   1. Acute kidney Injury on chronic kidney disease stage IV: Secondary to cardiorenal syndrome and transiently on CRRT after inadequate response to diuretics.  At her first hemodialysis treatment 2 days ago which he tolerated with ultrafiltration of 2.5 L and is on schedule for hemodialysis again today-this may be challenging with her overnight/current blood pressures (on high-dose midodrine 15 mg 3 times a day).  She is aware that if this fails, she will be transitioning to hospice care. 2.  Acute exacerbation of congestive heart failure: She remains on milrinone for inotropic support/cardiogenic shock and is currently off of pressors.  On schedule for HD today. 3.  Atrial fibrillation with rapid ventricular response: RVR with decreased cardiac output likely contributing to renal hypoperfusion/dysfunction. 4.  Hyponatremia: Secondary to acute exacerbation of congestive heart failure/worsening renal function and impaired free water handling.  Reminded about fluid restriction. 5.  Anemia of chronic disease/chronic kidney disease: Iron stores are replete and she has been started on ESA.  Subjective:   Denies any chest pain or shortness of breath, blood pressures soft to marginally low overnight-we had a lengthy discussion of the negative implications of this on hemodialysis.   Objective:   BP 98/61   Pulse (!) 124   Temp 98.4 F (36.9 C) (Oral)   Resp (!) 24   Ht 5' 3.5" (1.613 m)   Wt 123 kg   SpO2 99%   BMI 47.28 kg/m   Intake/Output Summary (Last 24 hours) at 12/08/2020 0735 Last data filed at 12/08/2020 0600 Gross per 24 hour  Intake 2064.06 ml  Output 300 ml  Net 1764.06 ml   Weight change: 1.3 kg  Physical Exam: Gen: She is comfortably sitting up in recliner CVS: Pulse irregularly irregular tachycardia, S1 and S2 normal Resp: Clear to  auscultation bilaterally without distinct rales or rhonchi.  Right IJ temporary dialysis catheter. Abd: Soft, obese, nontender, bowel sounds normal Ext: 1-2+ bilateral lower extremity edema  Imaging: No results found.  Labs: BMET Recent Labs  Lab 12/01/20 1702 12/02/20 0412 12/02/20 1526 12/03/20 0450 12/04/20 0407 12/05/20 0403 12/06/20 0424 12/07/20 0706 12/08/20 0543  NA 133* 135 134* 132* 132*  132* 130* 128* 133* 130*  K 4.2 3.7 3.8 4.0 3.9  3.9 4.3 4.3 4.1 3.9  CL 99 100 99 99 96*  96* 97* 92* 94* 93*  CO2 25 25 25 23 22  22  21* 20* 24 21*  GLUCOSE 154* 103* 121* 88 100*  101* 102* 103* 106* 111*  BUN 17 14 17 15  29*  29* 53* 70* 53* 65*  CREATININE 1.83* 1.83* 2.17* 2.04* 3.76*  3.79* 5.84* 7.33* 6.26* 7.76*  CALCIUM 9.3 9.5 9.0 9.3 10.0  10.0 9.6 9.8 9.4 9.4  PHOS 2.2* 2.3* 2.6 2.4* 4.1  --   --   --   --    CBC Recent Labs  Lab 12/05/20 0403 12/06/20 0424 12/07/20 0706 12/08/20 0543  WBC 12.8* 9.6 7.7 5.3  HGB 9.7* 9.0* 9.2* 8.4*  HCT 30.5* 28.5* 28.7* 27.3*  MCV 102.7* 102.5* 101.4* 104.6*  PLT 121* 104* 62* 73*    Medications:     (feeding supplement) PROSource Plus  30 mL Oral BID BM   B-complex with vitamin C  1 tablet Oral Daily   calcitRIOL  0.25 mcg Oral Daily   Chlorhexidine Gluconate Cloth  6 each Topical Daily   clotrimazole  1 application Topical BID   estradiol  1 mg Oral Daily   febuxostat  40 mg Oral q AM   ferrous sulfate  325 mg Oral q morning   fluticasone  2 spray Each Nare Daily   fluticasone furoate-vilanterol  1 puff Inhalation Daily   gabapentin  100 mg Oral TID   insulin aspart  0-6 Units Subcutaneous TID WC   levothyroxine  88 mcg Oral q morning   lidocaine  1 patch Transdermal Q24H   midodrine  15 mg Oral TID WC   predniSONE  5 mg Oral Q breakfast   senna-docusate  2 tablet Oral BID   sodium chloride flush  10-40 mL Intracatheter Q12H   sodium chloride flush  3 mL Intravenous Q12H   Elmarie Shiley, MD 12/08/2020,  7:35 AM

## 2020-12-08 NOTE — Progress Notes (Addendum)
ANTICOAGULATION CONSULT NOTE - Follow-Up Consult  Pharmacy Consult for bivalirudin Indication: atrial fibrillation  Patient Measurements: Height: 5' 3.5" (161.3 cm) Weight: 123 kg (271 lb 2.7 oz) IBW/kg (Calculated) : 53.55 Heparin Dosing Weight: 85 kg  Vital Signs: Temp: 97.8 F (36.6 C) (08/18 0800) Temp Source: Oral (08/18 0800) BP: 128/104 (08/18 0700) Pulse Rate: 129 (08/18 0700)  Labs: Recent Labs    12/06/20 0424 12/07/20 0706 12/07/20 1600 12/08/20 0543  HGB 9.0* 9.2*  --  8.4*  HCT 28.5* 28.7*  --  27.3*  PLT 104* 62*  --  73*  APTT  --   --  54* 80*  HEPARINUNFRC 0.59 0.66  --   --   CREATININE 7.33* 6.26*  --  7.76*     Estimated Creatinine Clearance: 9.4 mL/min (A) (by C-G formula based on SCr of 7.76 mg/dL (H)).   Medical History: Past Medical History:  Diagnosis Date   A-fib ([place])    Cardiomyopathy    Hyperlipidemia    Hypertension    Lupus ([place])    Pulmonary hypertension ([place]) 05/10/2011   Echo, EF-40-45   Renal disorder    Sleep apnea 2008   CPAP, pt does not know settings   Thyroid disease     Medications:     Assessment: 65 y.o. female with  h/o Afib, DOAC on hold, for heparin.  Pt was on Xarelto PTA and changed to Eliquis due to procedures.  Eliquis 5 mg last given at 2317 7/31.   Hit antibody negative (0.087). 8/17 Discussed with primary team given significant decline in platelet count will go ahead and change over to bival and send out a SRA which will take several days to result.   Aptt at upper end of goal at 80s. Plt count up to 73 off heparin. Hgb down 9.2>8.4. No bleeding issues noted. SRA in process.   Goal of Therapy:  Aptt goal 50-85s Monitor platelets by anticoagulation protocol: Yes   Plan:  Reduce bival 0.04mg /kg/hr Daily aPTT and CBC Follow up SRA  Addendum: repeat aptt 82s on 0.04mg /kg/hr. Will drop dose dose again to 0.03mg /kg/hr and recheck aptt in am  Erin Hearing PharmD., BCPS Clinical  Pharmacist 12/08/2020 3:05 PM

## 2020-12-08 NOTE — Consult Note (Signed)
Consultation Note Date: 12/08/2020   Patient Name: Caitlyn Fuentes  DOB: 1956-03-02  MRN: [id number]  Age / Sex: 65 y.o., female  PCP: Caitlyn Spanish, MD Referring Physician: Jolaine Artist, MD  Reason for Consultation: Establishing goals of care  HPI/Patient Profile: 65 y.o. female  with past medical history of PAF on systemic anticoagulation, chronic systolic CHF secondary to nonischemic cardiomyopathy, CKD stage IV, SLE on chronic steroids, HTN, COPD, pulmonary hypertension, gout, hypothyroidism admitted on 11/20/2020 with worsening of palpitations and shortness of breath and leg swelling.  Patient was found to have afib with RVR, acute on chronic systolic heart failure, cardiogenic shock, and AKI due to cardiorenal syndrome requiring CRRT. She is now off pressors and undergoing trials of iHD. Not a candidate for advanced HF therapies or ablation. Palliative medicine has been consulted to assist with goals of care conversation.  Clinical Assessment and Goals of Care:  I have reviewed medical records including EPIC notes, labs and imaging, assessed the patient and then met at the bedside to discuss diagnosis prognosis, GOC, EOL wishes, disposition and options.  I introduced Palliative Medicine as specialized medical care for people living with serious illness. It focuses on providing relief from the symptoms and stress of a serious illness. The goal is to improve quality of life for both the patient and the family.  We discussed a brief life review of the patient and then focused on their current illness. The natural disease trajectory and expectations at EOL were discussed.   Caitlyn Fuentes is a proud mother of 3 daughters and 1 son, who she raised on her own after a divorce. She has since remarried and normally cares for her husband Caitlyn Fuentes, who has dementia. She states that she has had a wonderful life and  she hopes to recover to continue spending time with her family, including her grandson and new granddaughter. She tries not to worry her family with updates on her condition. While she feels better now than she did at home, we discussed her high risk to decline and limited treatment options. Discussed possible difficulty tolerating dialysis with ongoing hypotension and that this would likely be life-limiting, certainly an appropriate time to consider of hospice. Caitlyn Fuentes shares her experiences with her deceased mother's illness and how her mother did not share details of hospice. She wonders whether she should call her children and let them know this may be needed so they can visit. She also wonders whether a second opinion should be requested at [facility], as she strongly wishes to continue pursing life-prolonging interventions. She is a woman of faith and states that she is uncertain of when God "will take me." She is working with her son to finalize her durable POA and ensure her sentimental possessions are in good hands if she were to die from this illness.   I attempted to elicit values and goals of care important to the patient.   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 She would like to live the rest of her life as happy as possible, encouraging those in need and giving to her community.    The difference between aggressive medical intervention and comfort care was considered in light of the patient's goals of care.   Advanced directives, concepts specific to code status, artifical feeding and hydration, and rehospitalization were considered and discussed. Emphasized the risk of causing more harm than benefit if CPR was desired. 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    Hospice and Palliative Care services outpatient were explained and offered.  Discussed the importance of continued conversation with family and the medical providers regarding overall plan of care and treatment options, ensuring decisions are within the context of the patient's values and GOCs.    Questions and concerns were addressed. The patient was encouraged to call with questions or concerns.  PMT will continue to support holistically.   PATIENT is primary Media planner. HCPOA/daughter is Caitlyn Fuentes if patient is unable to participate in medial decision making.    SUMMARY OF RECOMMENDATIONS   -Full code/full scope treatment, patient is hopeful for improvement and will begin discussing possibility of hospice with her family -Psychosocial and emotional support provided -Patient requests spiritual care consult -Ongoing discussions pending clinical course/whether patient tolerates HD  Code Status/Advance Care Planning: Full code  Palliative Prophylaxis:  Delirium Protocol, Frequent Pain Assessment, and Turn Reposition  Additional Recommendations (Limitations, Scope, Preferences): Full Scope Treatment  Psycho-social/Spiritual:  Desire for further Chaplaincy support:yes Additional Recommendations: Education on Hospice  Prognosis:  Guarded  Discharge Planning: To Be Determined      Primary Diagnoses: Present on Admission: **None**   I have reviewed the medical record, interviewed the patient and family, and examined the patient. The following aspects are pertinent.  Past Medical History:  Diagnosis Date   A-fib ([place])    Cardiomyopathy    Hyperlipidemia    Hypertension    Lupus ([place])    Pulmonary hypertension ([place]) 05/10/2011   Echo, [id number]   Renal disorder    Sleep apnea 2008   CPAP, pt does not know settings   Thyroid disease    Social History   Socioeconomic History   Marital status: Married    Spouse name: Not on file   Number of children: Not on  file   Years of education: Not on file   Highest education level: Not on file  Occupational History   Not on file  Tobacco Use   Smoking status: Never   Smokeless tobacco: Never  Vaping Use   Vaping Use: Never used  Substance and Sexual Activity   Alcohol use: Yes    Comment: once a month   Drug use: Never   Sexual activity: Not on file  Other Topics Concern   Not on file  Social History Narrative   ** Merged History Encounter **       Social Determinants of Health   Financial Resource Strain: Medium Risk   Difficulty of Paying Living Expenses: Somewhat hard  Food Insecurity: Food Insecurity Present   Worried About Running Out of Food in the Last Year: Sometimes true   Ran Out of Food in the Last Year: Sometimes true  Transportation Needs: No Transportation Needs   Lack of Transportation (Medical): No   Lack of Transportation (  Non-Medical): No  Physical Activity: Not on file  Stress: Not on file  Social Connections: Not on file   Family History  Problem Relation Age of Onset   Heart disease Mother    Heart disease Father    Lung disease Sister    Heart disease Brother    Hypertension Sister    Scheduled Meds:  (feeding supplement) PROSource Plus  30 mL Oral BID BM   B-complex with vitamin C  1 tablet Oral Daily   calcitRIOL  0.25 mcg Oral Daily   Chlorhexidine Gluconate Cloth  6 each Topical Daily   clotrimazole  1 application Topical BID   estradiol  1 mg Oral Daily   febuxostat  40 mg Oral q AM   ferrous sulfate  325 mg Oral q morning   fluticasone  2 spray Each Nare Daily   fluticasone furoate-vilanterol  1 puff Inhalation Daily   gabapentin  100 mg Oral TID   heparin sodium (porcine)       insulin aspart  0-6 Units Subcutaneous TID WC   levothyroxine  88 mcg Oral q morning   lidocaine  1 patch Transdermal Q24H   midodrine  15 mg Oral TID WC   predniSONE  5 mg Oral Q breakfast   senna-docusate  2 tablet Oral BID   sodium chloride flush  10-40 mL  Intracatheter Q12H   sodium chloride flush  3 mL Intravenous Q12H   Continuous Infusions:  sodium chloride     sodium chloride     sodium chloride     amiodarone 30 mg/hr (12/08/20 0600)   bivalirudin (ANGIOMAX) infusion 0.5 mg/mL (Non-ACS indications)     milrinone     norepinephrine (LEVOPHED) Adult infusion Stopped (12/05/20 0519)   PRN Meds:.sodium chloride, sodium chloride, sodium chloride, acetaminophen, albuterol, alteplase, calcium carbonate, cyclobenzaprine, HYDROcodone-acetaminophen, hydrocortisone cream, ondansetron (ZOFRAN) IV, oxyCODONE, sodium chloride flush, sodium chloride flush Medications Prior to Admission:  Prior to Admission medications   Medication Sig Start Date End Date Taking? Authorizing Provider  ADVAIR DISKUS 250-50 MCG/ACT AEPB Inhale 1 puff into the lungs 2 (two) times daily. 11/19/20  Yes [provider]  albuterol (PROVENTIL) (2.5 MG/3ML) 0.083% nebulizer solution Take 2.5 mg by nebulization every 6 (six) hours as needed for wheezing or shortness of breath.   Yes [provider]  Albuterol Sulfate (PROAIR RESPICLICK) 981 (90 Base) MCG/ACT AEPB Inhale 2 puffs into the lungs daily as needed (for shortness of brath or wheezing).   Yes [provider]  atorvastatin (LIPITOR) 40 MG tablet Take 1 tablet (40 mg total) by mouth daily. Patient taking differently: Take 40 mg by mouth at bedtime. 10/03/20  Yes Troy Sine, MD  Azelaic Acid 15 % cream Apply 1 application topically 2 (two) times daily as needed (to affected areas of the face).  05/28/19  Yes [provider]  calcitRIOL (ROCALTROL) 0.25 MCG capsule Take 0.25 mcg by mouth daily. 10/12/20  Yes [provider]  carvedilol (COREG) 25 MG tablet Take 1 tablet (25 mg total) by mouth 2 (two) times daily. 02/08/20  Yes Almyra Deforest, PA  clotrimazole (LOTRIMIN) 1 % cream Apply 1 application topically 2 (two) times daily as needed (rash/itching). 02/11/19  Yes [provider]  colchicine 0.6 MG tablet Take 0.6 mg by mouth daily as needed (gout attack).   Yes [provider]  cyclobenzaprine (FLEXERIL) 10 MG tablet Take 10 mg by mouth 3 (three) times daily as needed for muscle spasms.  09/20/19  Yes [provider]  estradiol (ESTRACE) 1 MG tablet Take 1 mg by mouth daily. 08/23/20  Yes [provider]  febuxostat (ULORIC) 40 MG tablet Take 40 mg by mouth in the morning. 10/15/19  Yes [provider]  fluticasone (FLONASE) 50 MCG/ACT nasal spray Place 2 sprays into both nostrils daily as needed for allergies or rhinitis. 01/19/20  Yes [provider]  gabapentin (NEURONTIN) 300 MG capsule Take 300 mg by mouth at bedtime.   Yes [provider]  guaiFENesin (MUCINEX PO) Take 1 tablet by mouth 2 (two) times daily as needed (cough/congestion).   Yes [provider]  isosorbide mononitrate (IMDUR) 60 MG 24 hr tablet TAKE 1 TABLET BY MOUTH EVERY DAY Patient taking differently: Take 60 mg by mouth daily. 07/15/20  Yes Troy Sine, MD  levothyroxine (SYNTHROID) 75 MCG tablet Take 75 mcg by mouth every morning. 12/02/19  Yes [provider]  oxyCODONE (OXY IR/ROXICODONE) 5 MG immediate release tablet Take 5 mg by mouth every 4 (four) hours as needed for severe pain.   Yes [provider]  rivaroxaban (XARELTO) 20 MG TABS tablet Take 1 tablet (20 mg total) by mouth daily with supper. 06/28/20  Yes Troy Sine, MD  torsemide (DEMADEX) 20 MG tablet TAKE 2 TABLETS (40 MG TOTAL) BY MOUTH 2 (TWO) TIMES DAILY. *NEEDS OFFICE VISIT FOR FURTHER REFILLS* Patient taking differently: Take 40 mg by mouth daily. 03/29/20  Yes Troy Sine, MD  acetaminophen (TYLENOL) 500 MG tablet Take 500-1,000 mg by mouth every 6 (six) hours as needed for mild pain or headache. Patient not taking: Reported on 11/21/2020    [provider]  aMILoride (MIDAMOR) 5 MG tablet Take 1 tablet (5 mg total) by mouth  every morning. Patient not taking: No sig reported 02/08/20   Almyra Deforest, PA  ferrous sulfate 325 (65 FE) MG tablet Take 325 mg by mouth every morning. Patient not taking: Reported on 11/21/2020 12/02/19   [provider]  gabapentin (NEURONTIN) 100 MG capsule Take 100 mg by mouth 3 (three) times daily. Patient not taking: No sig reported 12/04/16   [provider]  hydrALAZINE (APRESOLINE) 10 MG tablet Take 1 tablet (10 mg total) by mouth every 8 (eight) hours. Patient not taking: No sig reported 02/08/20   Almyra Deforest, PA  HYDROcodone-acetaminophen (NORCO/VICODIN) 5-325 MG tablet Take 1 tablet by mouth every 6 (six) hours as needed for moderate pain. Patient not taking: No sig reported    [provider]  predniSONE (STERAPRED UNI-PAK 21 TAB) 5 MG (21) TBPK tablet Take 5 mg by mouth as directed. Patient not taking: No sig reported 09/15/19   [provider]   Allergies  Allergen Reactions   Diovan [Valsartan] Other (See Comments)    Affected kidneys     Lisinopril Swelling    Facial swelling   Tape Rash and Other (See Comments)    PLEASE USE COBAN WRAP!!   Metolazone     Acute kidney injury in 07/2016   Review of Systems  All other systems reviewed and are negative.  Physical Exam Vitals and nursing note reviewed.  Constitutional:      General: She is not in acute distress.    Appearance: She is ill-appearing.     Interventions: Nasal cannula in place.  Cardiovascular:     Rate and Rhythm: Tachycardia present. Rhythm irregularly irregular.  Pulmonary:     Effort: No respiratory distress.  Neurological:  Mental Status: She is alert and oriented to person, place, and time.    Vital Signs: BP 94/61   Pulse (!) 130   Temp 97.8 F (36.6 C) (Oral)   Resp (!) 28   Ht 5' 3.5" (1.613 m)   Wt 123 kg   SpO2 94%   BMI 47.28 kg/m  Pain Scale: 0-10   Pain Score: 0-No pain   SpO2: SpO2: 94 % O2 Device:SpO2: 94 % O2 Flow Rate: .O2 Flow Rate  (L/min): 2 L/min  IO: Intake/output summary:  Intake/Output Summary (Last 24 hours) at 12/08/2020 1009 Last data filed at 12/08/2020 0654 Gross per 24 hour  Intake 1511.32 ml  Output 300 ml  Net 1211.32 ml    LBM: Last BM Date: 12/07/20 Baseline Weight: Weight: 119.7 kg Most recent weight: Weight: 123 kg     Palliative Assessment/Data: 30%     Time In: 10:00am Time Out: 11:30am Time Total: 90 minutes  Greater than 50% of this time was spent in counseling and coordinating care related to the above assessment and plan.  Caitlyn Cooler, PA-C Palliative Medicine Team Team phone # [phone]  Thank you for allowing the Palliative Medicine Team to assist in the care of this patient. Please utilize secure chat with additional questions, if there is no response within 30 minutes please call the above phone number.  Palliative Medicine Team providers are available by phone from 7am to 7pm daily and can be reached through the team cell phone.  Should this patient require assistance outside of these hours, please call the patient's attending physician.

## 2020-12-09 ENCOUNTER — Inpatient Hospital Stay (HOSPITAL_COMMUNITY): Payer: Medicare Other | Admitting: Anesthesiology

## 2020-12-09 ENCOUNTER — Other Ambulatory Visit (HOSPITAL_COMMUNITY): Payer: Self-pay

## 2020-12-09 ENCOUNTER — Encounter (HOSPITAL_COMMUNITY)
Admission: EM | Disposition: A | Discharge status: Hospice | Payer: Self-pay | Source: Home / Self Care | Attending: Internal Medicine

## 2020-12-09 DIAGNOSIS — R570 Cardiogenic shock: Secondary | ICD-10-CM | POA: Diagnosis not present

## 2020-12-09 DIAGNOSIS — N184 Chronic kidney disease, stage 4 (severe): Secondary | ICD-10-CM | POA: Diagnosis not present

## 2020-12-09 DIAGNOSIS — Z7189 Other specified counseling: Secondary | ICD-10-CM | POA: Diagnosis not present

## 2020-12-09 DIAGNOSIS — N179 Acute kidney failure, unspecified: Secondary | ICD-10-CM | POA: Diagnosis not present

## 2020-12-09 DIAGNOSIS — N170 Acute kidney failure with tubular necrosis: Secondary | ICD-10-CM | POA: Diagnosis not present

## 2020-12-09 DIAGNOSIS — I5023 Acute on chronic systolic (congestive) heart failure: Secondary | ICD-10-CM | POA: Diagnosis not present

## 2020-12-09 DIAGNOSIS — I4891 Unspecified atrial fibrillation: Secondary | ICD-10-CM | POA: Diagnosis not present

## 2020-12-09 HISTORY — PX: CARDIOVERSION: SHX1299

## 2020-12-09 LAB — CBC
HCT: 25.7 % — ABNORMAL LOW (ref 36.0–46.0)
Hemoglobin: 8.2 g/dL — ABNORMAL LOW (ref 12.0–15.0)
MCH: 32.8 pg (ref 26.0–34.0)
MCHC: 31.9 g/dL (ref 30.0–36.0)
MCV: 102.8 fL — ABNORMAL HIGH (ref 80.0–100.0)
Platelets: 88 10*3/uL — ABNORMAL LOW (ref 150–400)
RBC: 2.5 MIL/uL — ABNORMAL LOW (ref 3.87–5.11)
RDW: 15.3 % (ref 11.5–15.5)
WBC: 5.4 10*3/uL (ref 4.0–10.5)
nRBC: 0.4 % — ABNORMAL HIGH (ref 0.0–0.2)

## 2020-12-09 LAB — RENAL FUNCTION PANEL
Albumin: 2.7 g/dL — ABNORMAL LOW (ref 3.5–5.0)
Anion gap: 13 (ref 5–15)
BUN: 24 mg/dL — ABNORMAL HIGH (ref 8–23)
CO2: 26 mmol/L (ref 22–32)
Calcium: 9.2 mg/dL (ref 8.9–10.3)
Chloride: 95 mmol/L — ABNORMAL LOW (ref 98–111)
Creatinine, Ser: 4.44 mg/dL — ABNORMAL HIGH (ref 0.44–1.00)
GFR, Estimated: 11 mL/min — ABNORMAL LOW (ref >60)
Glucose, Bld: 108 mg/dL — ABNORMAL HIGH (ref 70–99)
Phosphorus: 3.7 mg/dL (ref 2.5–4.6)
Potassium: 3.6 mmol/L (ref 3.5–5.1)
Sodium: 134 mmol/L — ABNORMAL LOW (ref 135–145)

## 2020-12-09 LAB — COOXEMETRY PANEL
Carboxyhemoglobin: 1.9 % — ABNORMAL HIGH (ref 0.5–1.5)
Carboxyhemoglobin: 2.3 % — ABNORMAL HIGH (ref 0.5–1.5)
Methemoglobin: 0.9 % (ref 0.0–1.5)
Methemoglobin: 0.8 % (ref 0.0–1.5)
O2 Saturation: 44.2 %
O2 Saturation: 54.3 %
Total hemoglobin: 9.1 g/dL — ABNORMAL LOW (ref 12.0–16.0)
Total hemoglobin: 8.4 g/dL — ABNORMAL LOW (ref 12.0–16.0)

## 2020-12-09 LAB — MAGNESIUM: Magnesium: 2.1 mg/dL (ref 1.7–2.4)

## 2020-12-09 LAB — GLUCOSE, CAPILLARY
Glucose-Capillary: 121 mg/dL — ABNORMAL HIGH (ref 70–99)
Glucose-Capillary: 164 mg/dL — ABNORMAL HIGH (ref 70–99)
Glucose-Capillary: 113 mg/dL — ABNORMAL HIGH (ref 70–99)

## 2020-12-09 LAB — APTT: aPTT: 68 seconds — ABNORMAL HIGH (ref 24–36)

## 2020-12-09 SURGERY — CARDIOVERSION
Anesthesia: General

## 2020-12-09 MED ORDER — MIDAZOLAM HCL 2 MG/2ML IJ SOLN
INTRAMUSCULAR | Status: DC | PRN
  Administered 2020-12-09: 2 mg via INTRAVENOUS

## 2020-12-09 MED ORDER — SODIUM CHLORIDE 0.9 % IV SOLN
INTRAVENOUS | Status: DC | PRN
Start: 2020-12-09 — End: 2020-12-09

## 2020-12-09 MED ORDER — VASOPRESSIN 20 UNIT/ML IV SOLN
INTRAVENOUS | Status: AC
  Filled 2020-12-09: qty 1

## 2020-12-09 MED ORDER — MIDAZOLAM HCL 2 MG/2ML IJ SOLN
INTRAMUSCULAR | Status: AC
  Filled 2020-12-09: qty 2

## 2020-12-09 MED ORDER — DEXMEDETOMIDINE HCL IN NACL 200 MCG/50ML IV SOLN
INTRAVENOUS | Status: AC
  Filled 2020-12-09: qty 50

## 2020-12-09 MED ORDER — PROPOFOL 10 MG/ML IV BOLUS
INTRAVENOUS | Status: DC | PRN
  Administered 2020-12-09: 30 mg via INTRAVENOUS

## 2020-12-09 MED ORDER — DEXMEDETOMIDINE (PRECEDEX) IN NS 20 MCG/5ML (4 MCG/ML) IV SYRINGE
PREFILLED_SYRINGE | INTRAVENOUS | Status: DC | PRN
  Administered 2020-12-09: 12 ug via INTRAVENOUS

## 2020-12-09 MED ORDER — CHLORHEXIDINE GLUCONATE CLOTH 2 % EX PADS: 6.0000 | MEDICATED_PAD | Freq: Every day | CUTANEOUS | Status: DC

## 2020-12-09 MED ORDER — DOBUTAMINE IN D5W 4-5 MG/ML-% IV SOLN
2.5000 ug/kg/min | INTRAVENOUS | Status: AC
  Administered 2020-12-09 – 2020-12-11 (×2): 3 ug/kg/min via INTRAVENOUS
  Administered 2020-12-12 – 2020-12-18 (×5): 5 ug/kg/min via INTRAVENOUS
  Filled 2020-12-09 (×9): qty 250

## 2020-12-09 NOTE — Progress Notes (Signed)
PT Cancellation Note  Patient Details Name: Caitlyn Fuentes MRN: [id number] DOB: 04-23-1956   Cancelled Treatment:    Reason Eval/Treat Not Completed: Patient declined, no reason specified.  Pt up with OT this am.  She discussed with OT to tell PT that she's not up to further therapy today and can PT come on Saturday after cardioversion.  Will attempt to see pt 8/20.   12/09/2020  [name], PT Acute Rehabilitation Services [phone]  (pager) [phone]  (office)   [name] 12/09/2020, 12:32 PM

## 2020-12-09 NOTE — TOC Benefit Eligibility Note (Signed)
Transition of Care [facility]) Benefit Eligibility Note    Patient Details  Name: Sala Tague MRN: [id number] Date of Birth: [birth year]   Medication/Dose: Arne Cleveland  5 MG BID  Covered?: Yes  Tier: 3 Drug  Prescription Coverage [facility]erred [facility]: [facility]  Spoke with Person/Company/Phone Number:: KATHLEEN @ [facility] [employer] [facility] # [phone] OPT- MEMBER  Co-Pay: $ 112.15  Prior Approval: No  Deductible: Unmet Karleen Dolphin)       Memory Argue Phone Number: 12/09/2020, 1:09 PM

## 2020-12-09 NOTE — Discharge Summary (Addendum)
Advanced Heart Failure Team  Discharge Summary   Patient ID: Caitlyn Fuentes MRN: [id number], DOB/AGE: July 11, 1955 65 y.o. 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   Followed by Nephrology in [place] with adjustments made to diuretics. Torsemide stopped and placed on lasix. Since that time she noticed increased shortness of breath. Nephrology had started education on dialysis.    Admitted in 10/27/19  [facility] with A/C systolic heart failure. Diuresed with IV lasix. Discharged home.    Presented to  [place] 11/19/20 with worsening HF and volume overload in setting of recurrent AF of unknown duration. (Was in NSR in 11/21). Initially treated with IVF and IV diltiazem in ER. Patient then developed hypotension which improved after starting diltiazem.  Given IV lasix 120 with minimal response.  She decompensated further with hypotension. Arterial line and central access placed.  Lactate 1.5. Underwent emergent DC-CV to NSR. Started on milrinone and phenylephrine. Initial Co-ox 43%. HF team consulted. Switched phenylephrine to NE.  Bedside echo EF 25%. RV ok.  Hospital course c/b recurrent afib w/ RVR &AKI. Failed multiple DCCVs w/ recurrence. Also w/ progressive renal failure>>ESRD and anuric, becoming HD dependent. Required CRRT for volume removal. Later transitioned to iHD but unable to maintain euvolemia with switch to iHD despite milrinone support. Milrinone later discontinued and she was switched to dobutamine.   Also developed thrombocytopenia. Platelets dropped to nadir of 62K. Improved after  switch from heparin drip to bival. HIT negative. SRA +.    Due to difficulty coming off pressors and need for CRRT, Palliative Care Consulted for goals of care. Dr Caitlyn Fuentes had ongoing conversations with her and the family regarding persistent cardiogenic shock and that she was not a candidate for intermittent dialysis. On 12/18/20 CRRT stopped due to fractured vas-cath catheter. She discussed with her family and did not want to replace HD catheter. At that point she elected to transition to comfort care.  All nonessential labs/meds discontinued.   Hospice team consulted and she was deemed appropriate for [facility].  GOLD DNR form completed. She was discharged to [facility] via [place] with central line in place.   See below for detailed problem list.   1. Acute/Chronic Systolic Heart Failure -> Cardiogenic shock in setting AF with RVR - 8/1 Lactic acid 1.5. CO-OX 43%.--> started on milrinone and NE - Echo 7/22 EF 30-35% (2021)  This admit -> 25-30%. - NE stopped 8/12, then resumed briefly the morning of 8/15 -- later weaned off.  - CVVHD stopped 8/13 -> iHD. CVVHD restarted on 8/25 to get fluid down. - Unable to tolerate GDMT due to hypotension and AKI - Continue midodrine 15 mg TID  - Remains on DBA 2.5 but stopped with transition to hospice.  -[place] discussions have been ongoing. On 8/28 she elected to transition to comfort care.  - CVVHD stopped 12/18/20 .  - Discharge on torsemide 100 mg twice a day.    2. AFL with RVR - Developed shock and had urgent cardioversion 8/1 with conversion to NSR w/ ERAF - Repeat DCCV 8/4 w/ ERAF. Converted  to sinus tachycardia on 8/10 with IV amio - Afib again starting 8/17, DCCV on 8/19.  - Back in AF 8/25. Converted on amio - Seen by EP. Felt not to be a candidate for ablation. Not candidate for AVN ablation and CRT due to need for HD and risk of device infection -In Sinus Tach on the day discharge.  -Off heparin and amio drip.    3. AKI on CKD Stage  IV -> ESRD - Baseline SCr ~3.2, AKI on admission to 7.33 - Renal US with no acute findings - CVVHD restarted on 12/15/20 to remove fluid - CVVHD stopped 8/28 per patient request and transition to comfort care.    4. Gout - Uric acid of 10. Pain RLE that was a questionable flare.  Now resolved  - On uloric 40 mg daily  - Completed 3 day course of prednisone     5. Hypokalemia -- now resolved - Labs stopped with transition to DNR>comfort     6. Lupus - on low-dose prednisone.   7. Thrombocytopenia - Plt count of 121 -> 104 -- >62 switched from heparin drip to bival, but negative for HIT -- heparin resumed on 8/24  Labs stopped with transition to DNR>comfort    8. Hypervolemic Hyponatremia - followed closely.    9. DNR/DNI GOLD DNR form completed.   Discharge Vitals: Blood pressure 93/64, pulse (!) 42, temperature 98 F (36.7 C), temperature source Axillary, resp. rate (!) 23, height 5' 3.5" (1.613 m), weight 119.9 kg, SpO2 97 %.  Labs: Lab Results  Component Value Date   WBC 17.4 (H) 12/18/2020   HGB 7.9 (L) 12/18/2020   HCT 25.2 (L) 12/18/2020   MCV 101.6 (H) 12/18/2020   PLT 145 (L) 12/18/2020    Recent Labs  Lab 12/18/20 0342  NA 134*  K 5.0  CL 93*  CO2 25  BUN 12  CREATININE 2.61*  CALCIUM 8.8*  GLUCOSE 114*   Lab Results  Component Value Date   CHOL 126 08/18/2016   HDL 39 (L) 08/18/2016   LDLCALC 51 08/18/2016   TRIG 178 (H) 08/18/2016   BNP (last 3 results) Recent Labs    11/20/20 0318 11/21/20 0258  BNP 2,051.3* 2,022.4*    ProBNP (last 3 results) No results for input(s): PROBNP in the last 8760 hours.   Diagnostic Studies/Procedures   No results found.  2D Echo 11/24/20 1. Left ventricular ejection fraction, by estimation, is <20%. The left  ventricle has severely decreased function. The left ventricle demonstrates  global hypokinesis. The left ventricular internal cavity size was  moderately dilated.   2. Right ventricular systolic  function is severely reduced. The right  ventricular size is normal.   3. Left atrial size was severely dilated. No left atrial/left atrial  appendage thrombus was detected.   4. Right atrial size was moderately dilated.   5. The mitral valve is normal in structure. Moderate mitral valve  regurgitation.   6. Tricuspid valve regurgitation is moderate.   7. The aortic valve is tricuspid. Aortic valve regurgitation is not  visualized.   8. There is mild (Grade II) plaque involving the descending aorta.    Discharge Medications   Allergies as of 12/20/2020       Reactions   Diovan [valsartan] Other (See Comments)   Affected kidneys   Lisinopril Swelling   Facial swelling   Tape Rash, Other (See Comments)   PLEASE USE COBAN WRAP!!   Metolazone    Acute kidney  injury in 07/2016        Medication List     STOP taking these medications    acetaminophen 500 MG tablet Commonly known as: TYLENOL   aMILoride 5 MG tablet Commonly known as: MIDAMOR   atorvastatin 40 MG tablet Commonly known as: LIPITOR   Azelaic Acid 15 % gel   calcitRIOL 0.25 MCG capsule Commonly known as: ROCALTROL   carvedilol 25 MG tablet Commonly known as: COREG   clotrimazole 1 % cream Commonly known as: LOTRIMIN   colchicine 0.6 MG tablet   cyclobenzaprine 10 MG tablet Commonly known as: FLEXERIL   estradiol 1 MG tablet Commonly known as: ESTRACE   febuxostat 40 MG tablet Commonly known as: ULORIC   ferrous sulfate 325 (65 FE) MG tablet   fluticasone 50 MCG/ACT nasal spray Commonly known as: FLONASE   gabapentin 100 MG capsule Commonly known as: NEURONTIN   gabapentin 300 MG capsule Commonly known as: NEURONTIN   hydrALAZINE 10 MG tablet Commonly known as: APRESOLINE   HYDROcodone-acetaminophen 5-325 MG tablet Commonly known as: NORCO/VICODIN   isosorbide mononitrate 60 MG 24 hr tablet Commonly known as: IMDUR   levothyroxine 75 MCG tablet Commonly known as: SYNTHROID    MUCINEX PO   predniSONE 5 MG (21) Tbpk tablet Commonly known as: STERAPRED UNI-PAK 21 TAB Replaced by: predniSONE 5 MG tablet   rivaroxaban 20 MG Tabs tablet Commonly known as: Xarelto       TAKE these medications    Advair Diskus 250-50 MCG/ACT Aepb Generic drug: fluticasone-salmeterol Inhale 1 puff into the lungs 2 (two) times daily.   DOBUTamine 4-5 MG/ML-% infusion Commonly known as: DOBUTREX Inject 299.75 mcg/min into the vein continuous. Stop when [facility] on the way for transport   haloperidol 0.5 MG tablet Commonly known as: HALDOL Take 1 tablet (0.5 mg total) by mouth every 4 (four) hours as needed for agitation (or delirium).   LORazepam 2 MG/ML injection Commonly known as: ATIVAN Inject 0.25 mLs (0.5 mg total) into the vein every 30 (thirty) minutes as needed for anxiety (air hunger).   midodrine 5 MG tablet Commonly known as: PROAMATINE Take 3 tablets (15 mg total) by mouth 3 (three) times daily with meals.   oxyCODONE 5 MG immediate release tablet Commonly known as: Oxy IR/ROXICODONE Take 5 mg by mouth every 4 (four) hours as needed for severe pain.   predniSONE 5 MG tablet Commonly known as: DELTASONE Take 1 tablet (5 mg total) by mouth daily with breakfast. Replaces: predniSONE 5 MG (21) Tbpk tablet   ProAir RespiClick 326 (90 Base) MCG/ACT Aepb Generic drug: Albuterol Sulfate Inhale 2 puffs into the lungs daily as needed (for shortness of brath or wheezing). What changed: Another medication with the same name was removed. Continue taking this medication, and follow the directions you see here.   torsemide 100 MG tablet Commonly known as: DEMADEX Take 1 tablet (100 mg total) by mouth 2 (two) times daily. What changed:  medication strength how much to take additional instructions         Disposition   The patient will be discharged in stable condition to [facility] via [facility] Discharge Instructions     Diet - low sodium heart healthy    Complete by: As directed    Increase activity slowly   Complete by: As directed           Duration of Discharge Encounter: Greater than 35 minutes   Signed, Darrick Grinder, NP-C  12/20/2020, 3:46 PM  Agree with above.  Plan discharge to [facility] today.   Darrick Grinder, NP  3:46 PM  Patient seen and examined with the above-signed Advanced Practice Provider and/or Housestaff. I personally reviewed laboratory data, imaging studies and relevant notes. I independently examined the patient and formulated the important aspects of the plan. I have edited the note to reflect any of my changes or salient points. I have personally discussed the plan with the patient and/or family.  [place] for d/c to [facility].   Glori Bickers, MD  4:59 PM

## 2020-12-09 NOTE — Plan of Care (Signed)
  Problem: Education: Goal: Ability to demonstrate management of disease process will improve Outcome: Progressing Goal: Ability to verbalize understanding of medication therapies will improve Outcome: Progressing   Problem: Activity: Goal: Capacity to carry out activities will improve Outcome: Progressing   Problem: Cardiac: Goal: Ability to achieve and maintain adequate cardiopulmonary perfusion will improve Outcome: Progressing   Problem: Education: Goal: Knowledge of General Education information will improve Description: Including pain rating scale, medication(s)/side effects and non-pharmacologic comfort measures Outcome: Progressing   Problem: Health Behavior/Discharge Planning: Goal: Ability to manage health-related needs will improve Outcome: Progressing   Problem: Activity: Goal: Risk for activity intolerance will decrease Outcome: Progressing   Problem: Nutrition: Goal: Adequate nutrition will be maintained Outcome: Progressing   Problem: Coping: Goal: Level of anxiety will decrease Outcome: Progressing

## 2020-12-09 NOTE — Anesthesia Preprocedure Evaluation (Addendum)
Anesthesia Evaluation  Patient identified by MRN, date of birth, ID band Patient awake    Reviewed: Allergy & Precautions, NPO status , Patient's Chart, lab work & pertinent test results, reviewed documented beta blocker date and time   History of Anesthesia Complications Negative for: history of anesthetic complications  Airway Mallampati: III  TM Distance: >3 FB Neck ROM: Full    Dental  (+) Dental Advisory Given   Pulmonary sleep apnea and Continuous Positive Airway Pressure Ventilation ,    Pulmonary exam normal        Cardiovascular hypertension, Pt. on home beta blockers and Pt. on medications pulmonary hypertension+CHF  + dysrhythmias Atrial Fibrillation  Rhythm:Irregular Rate:Tachycardia   '22 TTE - EF <20%. Global hypokinesis. The left ventricular internal cavity size was moderately dilated. Right ventricular systolic function is severely reduced. Left atrial size was severely dilated. Right atrial size was moderately dilated.  Moderate mitral valve regurgitation. Tricuspid valve regurgitation is moderate. Mild (Grade II) plaque involving the descending aorta.     Neuro/Psych negative neurological ROS  negative psych ROS   GI/Hepatic negative GI ROS, Neg liver ROS,   Endo/Other  Hypothyroidism Morbid obesity  Renal/GU Dialysis and CRFRenal disease     Musculoskeletal negative musculoskeletal ROS (+)   Abdominal   Peds  Hematology  On xarelto    Anesthesia Other Findings Lupus   Reproductive/Obstetrics                            Anesthesia Physical Anesthesia Plan  ASA: 4  Anesthesia Plan: General   Post-op Pain Management:    Induction: Intravenous  PONV Risk Score and Plan: 3 and Treatment may vary due to age or medical condition and Propofol infusion  Airway Management Planned: Mask and Natural Airway  Additional Equipment: None  Intra-op Plan:   Post-operative  Plan:   Informed Consent: I have reviewed the patients History and Physical, chart, labs and discussed the procedure including the risks, benefits and alternatives for the proposed anesthesia with the patient or authorized representative who has indicated his/her understanding and acceptance.       Plan Discussed with: CRNA and Anesthesiologist  Anesthesia Plan Comments:        Anesthesia Quick Evaluation

## 2020-12-09 NOTE — Progress Notes (Addendum)
[facility] NEPHROLOGY PROGRESS NOTE  Assessment/ Plan:  # Acute kidney Injury on chronic kidney disease stage IV: Secondary to cardiorenal syndrome initially required CRRT after inadequate response to diuretics.  Now she is on intermittent hemodialysis.  The last treatment was overnight with around 2 L of ultrafiltration.  It seems like she tolerated fairly well except hypotension and ongoing afib rvr.  No sign of renal recovery therefore plan for next HD tomorrow.  She is aware that if this fails, she will be transitioning to hospice care.  # Acute exacerbation of congestive heart failure: She remains on milrinone for inotropic support/cardiogenic shock and is currently off of pressors.  On midodrine 15 mg 3 times daily.    # Atrial fibrillation with rapid ventricular response: Managed with amiodarone.  Monitor heart rate.  On anticoagulation.  # Hyponatremia: Secondary to acute exacerbation of congestive heart failure/worsening renal function and impaired free water handling.  Reminded about fluid restriction.  Ultrafiltration with HD.  # Anemia of chronic disease/chronic kidney disease: Iron stores are repleted and she has been started on ESA.  Monitor hemoglobin.  Subjective: Seen and examined at bedside.  She is sitting on chair comfortable.  Reports feeling much better.  Denies nausea vomiting chest pain shortness of breath.  Nursing staff at bedside. Objective Vital signs in last 24 hours: Vitals:   12/09/20 0500 12/09/20 0600 12/09/20 0702 12/09/20 0834  BP: 100/72 106/63 117/75   Pulse: (!) 117 (!) 123 (!) 135   Resp: (!) 26 (!) 27 (!) 25   Temp:      TempSrc:      SpO2: 96% 96% 96% 98%  Weight:  123.3 kg    Height:       Weight change: -3.6 kg  Intake/Output Summary (Last 24 hours) at 12/09/2020 0840 Last data filed at 12/09/2020 0700 Gross per 24 hour  Intake 1407.81 ml  Output 2000 ml  Net -592.19 ml       Labs: Basic Metabolic Panel: Recent Labs   Lab 12/03/20 0450 12/04/20 0407 12/05/20 0403 12/07/20 0706 12/08/20 0543 12/09/20 0425  NA 132* 132*  132*   < > 133* 130* 134*  K 4.0 3.9  3.9   < > 4.1 3.9 3.6  CL 99 96*  96*   < > 94* 93* 95*  CO2 23 22  22    < > 24 21* 26  GLUCOSE 88 100*  101*   < > 106* 111* 108*  BUN 15 29*  29*   < > 53* 65* 24*  CREATININE 2.04* 3.76*  3.79*   < > 6.26* 7.76* 4.44*  CALCIUM 9.3 10.0  10.0   < > 9.4 9.4 9.2  PHOS 2.4* 4.1  --   --   --  3.7   < > = values in this interval not displayed.   Liver Function Tests: Recent Labs  Lab 12/03/20 0450 12/04/20 0407 12/09/20 0425  ALBUMIN 3.2* 2.9* 2.7*   No results for input(s): LIPASE, AMYLASE in the last 168 hours. No results for input(s): AMMONIA in the last 168 hours. CBC: Recent Labs  Lab 12/05/20 0403 12/06/20 0424 12/07/20 0706 12/08/20 0543 12/09/20 0425  WBC 12.8* 9.6 7.7 5.3 5.4  HGB 9.7* 9.0* 9.2* 8.4* 8.2*  HCT 30.5* 28.5* 28.7* 27.3* 25.7*  MCV 102.7* 102.5* 101.4* 104.6* 102.8*  PLT 121* 104* 62* 73* 88*   Cardiac Enzymes: No results for input(s): CKTOTAL, CKMB, CKMBINDEX, TROPONINI in the last 168 hours.  CBG: Recent Labs  Lab 12/08/20 0631 12/08/20 1108 12/08/20 1644 12/08/20 2126 12/09/20 0624  GLUCAP 111* 129* 147* 129* 121*    Iron Studies: No results for input(s): IRON, TIBC, TRANSFERRIN, FERRITIN in the last 72 hours. Studies/Results: No results found.  Medications: Infusions:  sodium chloride     sodium chloride     sodium chloride     amiodarone 60 mg/hr (12/09/20 0700)   bivalirudin (ANGIOMAX) infusion 0.5 mg/mL (Non-ACS indications) 0.03 mg/kg/hr (12/09/20 0700)   milrinone 0.125 mcg/kg/min (12/09/20 0700)   norepinephrine (LEVOPHED) Adult infusion Stopped (12/05/20 0519)    Scheduled Medications:  (feeding supplement) PROSource Plus  30 mL Oral BID BM   calcitRIOL  0.25 mcg Oral Daily   Chlorhexidine Gluconate Cloth  6 each Topical Daily   clotrimazole  1 application Topical  BID   estradiol  1 mg Oral Daily   febuxostat  40 mg Oral q AM   ferrous sulfate  325 mg Oral q morning   fluticasone  2 spray Each Nare Daily   fluticasone furoate-vilanterol  1 puff Inhalation Daily   gabapentin  100 mg Oral TID   insulin aspart  0-6 Units Subcutaneous TID WC   levothyroxine  88 mcg Oral q morning   lidocaine  1 patch Transdermal Q24H   midodrine  15 mg Oral TID WC   multivitamin  1 tablet Oral QHS   predniSONE  5 mg Oral Q breakfast   senna-docusate  2 tablet Oral BID   sodium chloride flush  10-40 mL Intracatheter Q12H   sodium chloride flush  3 mL Intravenous Q12H    have reviewed scheduled and prn medications.  Physical Exam: General: Sitting on chair comfortable, not in distress Heart: Irregular heart rate, s1s2 nl Lungs:clear b/l, no crackle Abdomen:soft, Non-tender, non-distended Extremities: Trace LE edema present Dialysis Access: Right IJ temporary HD catheter in place  Nea Gittens Prasad Zoie Sarin 12/09/2020,8:40 AM  LOS: 19 days

## 2020-12-09 NOTE — TOC CM/SW Note (Signed)
HF TOC CM reviewed for appropriate dc when medically stable. PT/OT recommended CIR, will have PT/OT evaluation today. SNF vs HH. [place], Heart Failure TOC CM [phone]

## 2020-12-09 NOTE — Progress Notes (Addendum)
Daily Progress Note   Patient Name: Caitlyn Fuentes       Date: 12/09/2020 DOB: Aug 01, 1955  Age: 65 y.o. MRN#: [id number] Attending Physician: Jolaine Artist, MD Primary Care Physician: Guadlupe Spanish, MD Admit Date: 11/20/2020  Reason for Consultation/Follow-up: Establishing goals of care  Subjective: Medical records reviewed. Discussed with RN and assessed patient at the bedside. Sitting up in chair on room air. She reports feeling better today, although she is still worried about telling her children that she may need hospice if she does not improve or get enough fluid off with iHD. She is not anxious about her upcoming cardioversion this afternoon, as this has never bothered her in the past. Caitlyn Fuentes remains hopeful for improvement and wonders if she can be taken outside for fresh air and to see her infant granddaughter.  Caitlyn Fuentes is appreciative of the chaplain support and ongoing support from PMT. She questions whether she should call her family now to provide updates or wait until they arrive to visit (son will be here tomorrow through Monday, daughter will be here Monday through Wednesday). I encouraged open and honest dialogue, suggesting that if she speaks with them in advance they would have more time to think of questions to discuss in person. She is leaning towards having a family meeting on Monday and requests to defer these conversations if they are present before then.  Questions and concerns addressed. Hard Choices booklet provided for review. PMT will continue to support holistically.  Length of Stay: 19  Current Medications: Scheduled Meds:   (feeding supplement) PROSource Plus  30 mL Oral BID BM   calcitRIOL  0.25 mcg Oral Daily   Chlorhexidine Gluconate Cloth  6 each  Topical Daily   clotrimazole  1 application Topical BID   estradiol  1 mg Oral Daily   febuxostat  40 mg Oral q AM   ferrous sulfate  325 mg Oral q morning   fluticasone  2 spray Each Nare Daily   fluticasone furoate-vilanterol  1 puff Inhalation Daily   gabapentin  100 mg Oral TID   insulin aspart  0-6 Units Subcutaneous TID WC   levothyroxine  88 mcg Oral q morning   lidocaine  1 patch Transdermal Q24H   midodrine  15 mg Oral TID WC   multivitamin  1 tablet  Oral QHS   predniSONE  5 mg Oral Q breakfast   senna-docusate  2 tablet Oral BID   sodium chloride flush  10-40 mL Intracatheter Q12H   sodium chloride flush  3 mL Intravenous Q12H    Continuous Infusions:  sodium chloride     sodium chloride     sodium chloride     amiodarone 60 mg/hr (12/09/20 1029)   bivalirudin (ANGIOMAX) infusion 0.5 mg/mL (Non-ACS indications) 0.03 mg/kg/hr (12/09/20 0800)   DOBUTamine 3 mcg/kg/min (12/09/20 1033)   norepinephrine (LEVOPHED) Adult infusion Stopped (12/05/20 0519)    PRN Meds: sodium chloride, sodium chloride, sodium chloride, acetaminophen, albuterol, alteplase, calcium carbonate, cyclobenzaprine, HYDROcodone-acetaminophen, hydrocortisone cream, ondansetron (ZOFRAN) IV, oxyCODONE, sodium chloride flush, sodium chloride flush  Physical Exam Vitals and nursing note reviewed.  Constitutional:      General: She is not in acute distress.    Appearance: She is ill-appearing.  Cardiovascular:     Rate and Rhythm: Rhythm regularly irregular.  Pulmonary:     Effort: Tachypnea present.  Neurological:     Mental Status: She is alert and oriented to person, place, and time.  Psychiatric:        Behavior: Behavior is cooperative.            Vital Signs: BP 94/64   Pulse (!) 126   Temp 98.2 F (36.8 C) (Oral)   Resp (!) 26   Ht 5' 3.5" (1.613 m)   Wt 123.3 kg   SpO2 100%   BMI 47.40 kg/m  SpO2: SpO2: 100 % O2 Device: O2 Device: Room Air O2 Flow Rate: O2 Flow Rate (L/min): 2  L/min  Intake/output summary:  Intake/Output Summary (Last 24 hours) at 12/09/2020 1248 Last data filed at 12/09/2020 0701 Gross per 24 hour  Intake 1100.88 ml  Output 2000 ml  Net -899.12 ml   LBM: Last BM Date: 12/09/20 Baseline Weight: Weight: 119.7 kg Most recent weight: Weight: 123.3 kg       Palliative Assessment/Data: 30-40%      Patient Active Problem List   Diagnosis Date Noted   Goals of care, counseling/discussion    Palliative care by specialist    Atrial fibrillation with rapid ventricular response ([place]) 11/20/2020   CHF (congestive heart failure) ([place]) 11/20/2020   CKD (chronic kidney disease) stage 4, GFR 15-29 ml/min ([place]) 10/16/2019   Acute renal failure superimposed on stage 4 chronic kidney disease ([place]) 10/16/2019   Acute on chronic systolic heart failure ([place]) 10/16/2019   Gout due to renal impairment 10/16/2019   Morbid obesity ([place]) 10/16/2019   Hypokalemia 10/15/2019   CKD (chronic kidney disease) stage 3, GFR 30-59 ml/min ([facility]) 08/28/2016   Dizziness 08/17/2016   Acute hypokalemia 08/17/2016   Acute hyponatremia 08/17/2016   Chest tightness 08/17/2016   Near syncope 08/17/2016   Nonischemic cardiomyopathy ([place]) 05/25/2015   Chronic combined systolic and diastolic heart failure ([place]) 05/25/2015   Palpitations 05/25/2015   Gout 10/16/2013   Hyperlipidemia 10/16/2013   Long term current use of anticoagulant therapy 07/20/2013   Chest pain, atypical 08/16/2011   Morbid obesity ([place]) 08/16/2011   Bilateral lower extremity edema 08/16/2011   Obstructive sleep apnea on CPAP 08/16/2011   Paroxysmal atrial fibrillation ([place]) 08/16/2011    Palliative Care Assessment & Plan   Patient Profile:  65 y.o. female  with past medical history of PAF on systemic anticoagulation, chronic systolic CHF secondary to nonischemic cardiomyopathy, CKD stage IV, SLE on chronic steroids, HTN, COPD, pulmonary hypertension, gout,  hypothyroidism admitted on 11/20/2020 with  worsening of palpitations and shortness of breath and leg swelling.   Patient was found to have afib with RVR, acute on chronic systolic heart failure, cardiogenic shock, and AKI due to cardiorenal syndrome requiring CRRT. She is now off pressors and undergoing trials of iHD. Not a candidate for advanced HF therapies or ablation. Palliative medicine has been consulted to assist with goals of care conversation.  Assessment: Goals of care conversation ESRD on HD Acute on chronic systolic heart failure Deconditioning  Recommendations/Plan: Continue full code/full scope Continue encouraging patient to include her family in goc/acp conversations - tentative family meeting on Monday 8/22 Psychosocial and emotional support provided  Goals of Care and Additional Recommendations: Limitations on Scope of Treatment: Full Scope Treatment  Prognosis:  Guarded  Discharge Planning: To Be Determined  Care plan was discussed with Patient  Total time: 25 minutes Greater than 50% of this time was spent in counseling and coordinating care related to the above assessment and plan.  Dorthy Cooler, PA-C Palliative Medicine Team Team phone # [phone]  Thank you for allowing the Palliative Medicine Team to assist in the care of this patient. Please utilize secure chat with additional questions, if there is no response within 30 minutes please call the above phone number.  Palliative Medicine Team providers are available by phone from 7am to 7pm daily and can be reached through the team cell phone.  Should this patient require assistance outside of these hours, please call the patient's attending physician.

## 2020-12-09 NOTE — Progress Notes (Signed)
ANTICOAGULATION CONSULT NOTE - Follow-Up Consult  Pharmacy Consult for bivalirudin Indication: atrial fibrillation  Patient Measurements: Height: 5' 3.5" (161.3 cm) Weight: 123.3 kg (271 lb 13.2 oz) IBW/kg (Calculated) : 53.55 Heparin Dosing Weight: 85 kg  Vital Signs: Temp: 97.7 F (36.5 C) (08/19 0400) Temp Source: Oral (08/19 0400) BP: 117/75 (08/19 0702) Pulse Rate: 135 (08/19 0702)  Labs: Recent Labs    12/07/20 0706 12/07/20 1600 12/08/20 0543 12/08/20 1344 12/09/20 0425  HGB 9.2*  --  8.4*  --  8.2*  HCT 28.7*  --  27.3*  --  25.7*  PLT 62*  --  73*  --  88*  APTT  --    < > 80* 82* 68*  HEPARINUNFRC 0.66  --   --   --   --   CREATININE 6.26*  --  7.76*  --  4.44*   < > = values in this interval not displayed.     Estimated Creatinine Clearance: 16.5 mL/min (A) (by C-G formula based on SCr of 4.44 mg/dL (H)).   Medical History: Past Medical History:  Diagnosis Date   A-fib ([place])    Cardiomyopathy    Hyperlipidemia    Hypertension    Lupus ([place])    Pulmonary hypertension ([place]) 05/10/2011   Echo, EF-40-45   Renal disorder    Sleep apnea 2008   CPAP, pt does not know settings   Thyroid disease     Medications:     Assessment: 65 y.o. female with  h/o Afib, DOAC on hold, for heparin.  Pt was on Xarelto PTA and changed to Eliquis due to procedures.  Eliquis 5 mg last given at 2317 7/31.   Hit antibody negative (0.087). 8/17 Discussed with primary team given significant decline in platelet count will go ahead and change over to bival and send out a SRA which will take several days to result.   APTT came back therapeutic at 68, on bival@0 .03 mg/kg/hr. Plt count up to 88 today. Hgb down to 8.2. No bleeding issues noted. SRA in process.   Goal of Therapy:  Aptt goal 50-85s Monitor platelets by anticoagulation protocol: Yes   Plan:  Continue bival 0.03 mg/kg/hr Daily aPTT and CBC Follow up [place], PharmD, [place] Pharmacist   Phone: [phone] 12/09/2020 8:54 AM  Please check AMION for all [place] phone numbers After 10:00 PM, call [place] [phone]

## 2020-12-09 NOTE — Transfer of Care (Signed)
Immediate Anesthesia Transfer of Care Note  Patient: Caitlyn Fuentes  Procedure(s) Performed: CARDIOVERSION  Patient Location: ICU  Anesthesia Type:General  Level of Consciousness: drowsy and patient cooperative  Airway & Oxygen Therapy: Patient Spontanous Breathing and Patient connected to nasal cannula oxygen  Post-op Assessment: Report given to RN and Post -op Vital signs reviewed and stable  Post vital signs: Reviewed and stable  Last Vitals:  Vitals Value Taken Time  BP 102/69 12/09/20 1559  Temp    Pulse 110 12/09/20 1559  Resp 19 12/09/20 1559  SpO2 95 % 12/09/20 1559    Last Pain:  Vitals:   12/09/20 1200  TempSrc:   PainSc: 0-No pain      Patients Stated Pain Goal: 0 (40/81/44 8185)  Complications: No notable events documented.

## 2020-12-09 NOTE — Progress Notes (Signed)
Occupational Therapy Treatment Patient Details Name: Caitlyn Fuentes MRN: [id number] DOB: 24-May-1955 Today's Date: 12/09/2020    History of present illness Pt is a 65 y/o female presenting on 11/20/20 with worsening palpitations, SOB and LE edema. Pt found in rapid a fib, chest x-ray with significant vascular congestion. 8/1 hypotensive with cardiogenic shock, transferred to ICU and urgent cardioversion.   S/P TEE and cardioversion 8/4. CRRT started 8/8. For another attempt at [facility] (8/19).PMH includes: afib, cardiomyopathy, HTN, lupus, PAF, CKD.   OT comments  This 65 yo female seen today to focus on toilet transfers. She does well overall with transfers and short distances of ambulation but HR increases even more (started 121 and went up to 143) and makes her even more tired in addition to dialysis.She will continue to benefit from acute OT with follow up CIR.v. SNF depending on tolerance for activity.  Follow Up Recommendations  CIR (may not be able to tolerate CIR --may need consider SNF)    Equipment Recommendations  Other (comment) (TBD next venue)          Precautions / Restrictions Precautions Precautions: Fall Precaution Comments: watch HR--afib Restrictions Weight Bearing Restrictions: No       Mobility Bed Mobility               General bed mobility comments: OOB in recliner    Transfers Overall transfer level: Needs assistance Equipment used: Rolling walker (2 wheeled) Transfers: Sit to/from Stand Sit to Stand: Min guard              Balance Overall balance assessment: Needs assistance Sitting-balance support: Feet supported Sitting balance-Leahy Scale: Good     Standing balance support: No upper extremity supported Standing balance-Leahy Scale: Fair                             ADL either performed or assessed with clinical judgement   ADL Overall ADL's : Needs assistance/impaired                          Toilet Transfer: Min guard;Ambulation;RW Toilet Transfer Details (indicate cue type and reason): from recliner into bathroom with 3n1 on over toilet                 Vision Patient Visual Report: No change from baseline            Cognition Arousal/Alertness: Awake/alert Behavior During Therapy: WFL for tasks assessed/performed Overall Cognitive Status: Within Functional Limits for tasks assessed                                                     Pertinent Vitals/ Pain       Pain Assessment: No/denies pain         Frequency  Min 2X/week        Progress Toward Goals  OT Goals(current goals can now be found in the care plan section)  Progress towards OT goals: Progressing toward goals  Acute Rehab OT Goals Patient Stated Goal: for cardioversion to work OT Goal Formulation: With patient Time For Goal Achievement: 12/23/20 Potential to Achieve Goals: [place] Discharge plan remains appropriate;Frequency remains appropriate       AM-PAC OT "6 Clicks" Daily Activity  Outcome Measure   Help from another person eating meals?: None Help from another person taking care of personal grooming?: A Little Help from another person toileting, which includes using toliet, bedpan, or urinal?: A Little Help from another person bathing (including washing, rinsing, drying)?: A Little Help from another person to put on and taking off regular upper body clothing?: A Little Help from another person to put on and taking off regular lower body clothing?: A Lot 6 Click Score: 18    End of Session Equipment Utilized During Treatment: Rolling walker  OT Visit Diagnosis: Other abnormalities of gait and mobility (R26.89);Muscle weakness (generalized) (M62.81)   Activity Tolerance  (limited by tiredness and increased HR)   Patient Left in chair;with call bell/phone within reach   Nurse Communication  (unhook an iv to get untangled then hook back up)         Time: 3810-1751 OT Time Calculation (min): 33 min  Charges: OT General Charges $OT Visit: 1 Visit OT Treatments $Self Care/Home Management : 23-37 mins  [place], OTR/L Acute [employer] Pager [phone] Office [phone]     Almon Register 12/09/2020, 12:46 PM

## 2020-12-09 NOTE — Progress Notes (Signed)
Pt assisted with home CPAP.  

## 2020-12-09 NOTE — Anesthesia Postprocedure Evaluation (Signed)
Anesthesia Post Note  Patient: Caitlyn Fuentes  Procedure(s) Performed: CARDIOVERSION     Patient location during evaluation: PACU Anesthesia Type: General Level of consciousness: awake and alert Pain management: pain level controlled Vital Signs Assessment: post-procedure vital signs reviewed and stable Respiratory status: spontaneous breathing, nonlabored ventilation and respiratory function stable Cardiovascular status: stable and blood pressure returned to baseline Anesthetic complications: no   No notable events documented.  Last Vitals:  Vitals:   12/09/20 1558 12/09/20 1559  BP:  102/69  Pulse: (!) 110 (!) 110  Resp: 20 19  Temp:    SpO2: 94% 95%    Last Pain:  Vitals:   12/09/20 1200  TempSrc:   PainSc: 0-No pain                 Audry Pili

## 2020-12-09 NOTE — TOC CM/SW Note (Signed)
HF TOC CM sent for benefits check for Eliquis. [place], Heart Failure TOC CM [phone]

## 2020-12-09 NOTE — Progress Notes (Addendum)
Advanced Heart Failure Rounding Note  PCP-Cardiologist: Shelva Majestic, MD  HF clinic: Dr. Haroldine Laws  Subjective:    Events: - 8/1 A fib RVR & A/C systolic heart faiure --> cardiogenic shock. Had urgent DC-CV with brief conversion to NSR but went back in A fib. . On Norepi + milrinone. AKI. Nephrology consulted.  - 8/2 A fib RVR, amio gtt. Continued on milrinone 0.375 mcg + norepi 2 mcg. Started on prednisone 40 mg daily x3 days for gout flare.  - 8/3 did not tolerate milrinone wean w/ drop in Co-ox 68%--->48% and decrease in UOP. Milrinone increased back to 0.375.  Lasix gtt increased to 30/hr.  - 8/4 NE added back given persistently low Co-ox - 8/4 s/p TEE/DCCV>>NSR  - 8/5 back in Afib w/ RVR  -  08/09 HD cath placed, CRRT started - 8/10: conversion to sinus tachycardia  - 8/13 CRRT stopped - 8/15 recurrent AF - 8/16 tolerated iHD - 8/18 tolerated iHD   Tolerated iHD yesterday w/ 2L fluid removal. However remains fluid overloaded. CVP ~20-22. Wt up ~20 since being off CRRT   Off NE. Milrinone down to 0.125. Co-ox 44%. STAT repeat pending. CO2 ok at 26   Remains in Afib, 120s.   OOB, sitting up in chair. Reports she feels well and better. No nausea w/ HD yesterday. Denies resting dyspnea.    Objective:   Weight Range: 123.3 kg Body mass index is 47.4 kg/m.   Vital Signs:   Temp:  [97.7 F (36.5 C)-98.8 F (37.1 C)] 97.7 F (36.5 C) (08/19 0400) Pulse Rate:  [41-140] 135 (08/19 0702) Resp:  [13-31] 25 (08/19 0702) BP: (81-141)/(45-126) 117/75 (08/19 0702) SpO2:  [73 %-100 %] 96 % (08/19 0702) Weight:  [117 kg-123.3 kg] 123.3 kg (08/19 0600) Last BM Date: 12/07/20  Weight change: Filed Weights   12/08/20 1945 12/08/20 2245 12/09/20 0600  Weight: 119.4 kg 117 kg 123.3 kg    Intake/Output:   Intake/Output Summary (Last 24 hours) at 12/09/2020 0835 Last data filed at 12/09/2020 0700 Gross per 24 hour  Intake 1407.81 ml  Output 2000 ml  Net -592.19 ml      Physical Exam   CVP 20-22 General:  Well appearing, obese, sitting up in chair. No respiratory difficulty HEENT: normal Neck: supple. JVD elevated to ear, Rt IJ HD cath Carotids 2+ bilat; no bruits. No lymphadenopathy or thyromegaly appreciated. Cor: PMI nondisplaced. Irregularly irregular rhythm, tachy rate. No rubs, gallops or murmurs. Lungs: clear Abdomen: obese, soft, nontender, nondistended. No hepatosplenomegaly. No bruits or masses. Good bowel sounds. Extremities: no cyanosis, clubbing, rash, 1+ bilateral LE edema + unna boots  Neuro: alert & oriented x 3, cranial nerves grossly intact. moves all 4 extremities w/o difficulty. Affect pleasant.   Telemetry   AF 120s Personally reviewed  Labs    CBC Recent Labs    12/08/20 0543 12/09/20 0425  WBC 5.3 5.4  HGB 8.4* 8.2*  HCT 27.3* 25.7*  MCV 104.6* 102.8*  PLT 73* 88*   Basic Metabolic Panel Recent Labs    12/08/20 0543 12/09/20 0425  NA 130* 134*  K 3.9 3.6  CL 93* 95*  CO2 21* 26  GLUCOSE 111* 108*  BUN 65* 24*  CREATININE 7.76* 4.44*  CALCIUM 9.4 9.2  MG 2.1 2.1  PHOS  --  3.7   Liver Function Tests Recent Labs    12/09/20 0425  ALBUMIN 2.7*    No results for input(s): LIPASE, AMYLASE in the last  72 hours. Cardiac Enzymes No results for input(s): CKTOTAL, CKMB, CKMBINDEX, TROPONINI in the last 72 hours.  BNP: BNP (last 3 results) Recent Labs    11/20/20 0318 11/21/20 0258  BNP 2,051.3* 2,022.4*    ProBNP (last 3 results) No results for input(s): PROBNP in the last 8760 hours.   D-Dimer No results for input(s): DDIMER in the last 72 hours. Hemoglobin A1C No results for input(s): HGBA1C in the last 72 hours.  Fasting Lipid Panel No results for input(s): CHOL, HDL, LDLCALC, TRIG, CHOLHDL, LDLDIRECT in the last 72 hours. Thyroid Function Tests No results for input(s): TSH, T4TOTAL, T3FREE, THYROIDAB in the last 72 hours.  Invalid input(s): FREET3   Other results:   Imaging     No results found.   Medications:     Scheduled Medications:  (feeding supplement) PROSource Plus  30 mL Oral BID BM   calcitRIOL  0.25 mcg Oral Daily   Chlorhexidine Gluconate Cloth  6 each Topical Daily   clotrimazole  1 application Topical BID   estradiol  1 mg Oral Daily   febuxostat  40 mg Oral q AM   ferrous sulfate  325 mg Oral q morning   fluticasone  2 spray Each Nare Daily   fluticasone furoate-vilanterol  1 puff Inhalation Daily   gabapentin  100 mg Oral TID   insulin aspart  0-6 Units Subcutaneous TID WC   levothyroxine  88 mcg Oral q morning   lidocaine  1 patch Transdermal Q24H   midodrine  15 mg Oral TID WC   multivitamin  1 tablet Oral QHS   predniSONE  5 mg Oral Q breakfast   senna-docusate  2 tablet Oral BID   sodium chloride flush  10-40 mL Intracatheter Q12H   sodium chloride flush  3 mL Intravenous Q12H    Infusions:  sodium chloride     sodium chloride     sodium chloride     amiodarone 60 mg/hr (12/09/20 0700)   bivalirudin (ANGIOMAX) infusion 0.5 mg/mL (Non-ACS indications) 0.03 mg/kg/hr (12/09/20 0700)   milrinone 0.125 mcg/kg/min (12/09/20 0700)   norepinephrine (LEVOPHED) Adult infusion Stopped (12/05/20 0519)    PRN Medications: sodium chloride, sodium chloride, sodium chloride, acetaminophen, albuterol, alteplase, calcium carbonate, cyclobenzaprine, HYDROcodone-acetaminophen, hydrocortisone cream, ondansetron (ZOFRAN) IV, oxyCODONE, sodium chloride flush, sodium chloride flush    Patient Profile   Caitlyn Fuentes is a 65 year old with a history of chronic systolic hf previously EF 45%,  NICM, hypothyroid, PAF, gout, OSA, CKD Stage IV, HTN, hyperlipidemia, and lupus.   Admitted with A fib RVR and A/C systolic heart failure complicated by cardiogenic shock.     Assessment/Plan   1. Acute/Chronic Systolic Heart Failure -> Cardiogenic shock in setting AF with RVR - 8/1 Lactic acid 1.5. CO-OX 43%.--> started on milrinone and NE - Echo  7/22 EF 30-35% (2021)  This admit -> 25-30%. - NE stopped 8/12, then resumed briefly the morning of 8/15 -- later weaned off.  - On Milrinone 0.125. Co-ox 44%. STAT repeat pending  - CVVHD stopped 8/13. Tolerating iHD but remains fluid overloaded, CVP 20  - Unable to tolerate GDMT due to hypotension and AKI - Not candidate for advanced therapies with size and ESRD. If unable to tolerate iHD off pressors/ fails milrinone wean would require Hospice evaluation -- Palliative care now following    2. PAF--> Afib RVR - Developed shock and had urgent cardioversion 8/1 with conversion to NSR w/ ERAF - Repeat DCCV 8/4 w/ ERAF -  Conversion to sinus tachycardia on 8/10 with IV amio - Remains in A fib since 8/17 - c/w amio gtt at 60/hr   - unfortunately, not a candidate for ablation due to size if AF were to recur. Not candidate for AVN ablation and CRT. - plt down so she was switched to Bival. Platelets trending up.  - SRA pending.      3. AKI on CKD Stage IV -> ESRD - Baseline SCr ~3.2, now up to 5.84 -> 7.33 - Renal US with no acute findings - Off CRRT on 8/13. No response to high-dose lasix. Now on iHD but struggling to keep fluid down. May need to consider hospice  - Will keep in ICU while we are determining if she can tolerate iHD. Restart NE as needed to keep MAP >= 70 to preserve renal perfusion   4. OSA - Uses CPAP, but refused overnight    5. Hypothyroidism -TSH 8. T3 ok and T4 2.3  -On synthroid. Dose increased to 88 mcg -Repeat TSH in 6 weeks.    6. Gout - Uric acid 10. Pain RLE ? Flare  - On uloric 40 mg daily  - Completed 3 day course of prednisone    7. Hypokalemia -- now resolved -Stable.   8. Deconditioning - Continue PT/OT--> CIR following.  - needs HHPT. ? SNF   9. Hypervolemic hyponatremia -- improving - Sodium 1304today.  - limit free H2O -- 1200 mL fluid restriction ordered   10. Lupus - on low-dose prednisone.  11. Thrombocytopenia - Plt count of 121 ->  104 -- >62 switched from heparin drip to bival-->73->88 today  - HIT panel negative.  - SRA pending.  - No active bleeding noted   Lyda Jester, PA-C  12/09/2020, 8:35 AM  Advanced Heart Failure Team Pager [phone] (M-F; 7a - 5p)  Please contact [facility] for night-coverage after hours (5p -7a ) and weekends on [email or web address]   Agree with above.  Tolerated iHD yesterday with milrinone support. Unfortunately volume status continues to get worse and co-ox now back down.   Remains in AF with RVR despite amio gtt.   Denies SOB, orthopnea or PND  General: Sitting up No resp difficulty HEENT: normal Neck: supple. + HD cath Carotids 2+ bilat; no bruits. No lymphadenopathy or thryomegaly appreciated. Cor: PMI nondisplaced. Irr tachy  Lungs: clear Abdomen: obese soft, nontender, nondistended. No hepatosplenomegaly. No bruits or masses. Good bowel sounds. Extremities: no cyanosis, clubbing, rash, 3+ edema  + UNNA Neuro: alert & orientedx3, cranial nerves grossly intact. moves all 4 extremities w/o difficulty. Affect pleasant  Unfortunately, she has not been able to maintain euvolemia with switch to iHD despite milrinone support. Co-ox now back down and CVP back up. We will switch milrinone to dobutamine.   AF has also been complicating factor. Was in/out sinus for several days but now with persistent AF. Will plan another attempt at [facility] later today. (Make NPO)  If not able to keep her dry (and wean pressors) with iHD we will have no other option except comfort care. No role for resuming CVVHD. Palliative Care involved.   CRITICAL CARE Performed by: Glori Bickers  Total critical care time: 35 minutes  Critical care time was exclusive of separately billable procedures and treating other patients.  Critical care was necessary to treat or prevent imminent or life-threatening deterioration.  Critical care was time spent personally by me (independent of midlevel providers or  residents) on the following activities: development of treatment plan  with patient and/or surrogate as well as nursing, discussions with consultants, evaluation of patient's response to treatment, examination of patient, obtaining history from patient or surrogate, ordering and performing treatments and interventions, ordering and review of laboratory studies, ordering and review of radiographic studies, pulse oximetry and re-evaluation of patient's condition.  Glori Bickers, MD  9:36 AM

## 2020-12-09 NOTE — CV Procedure (Signed)
    DIRECT CURRENT CARDIOVERSION  NAME:  Caitlyn Fuentes   MRN: [id number] DOB:  [birth year]   ADMIT DATE: 11/20/2020   INDICATIONS: Atrial fibrillation    PROCEDURE:   Informed consent was obtained prior to the procedure. The risks, benefits and alternatives for the procedure were discussed and the patient comprehended these risks. Once an appropriate time out was taken, the patient had the defibrillator pads placed in the anterior and posterior position. The patient then underwent sedation by the anesthesia service. Once an appropriate level of sedation was achieved, the patient received a single biphasic, synchronized 200J shock with prompt conversion to sinus rhythm. No apparent complications.  Glori Bickers, MD  8:43 PM

## 2020-12-09 NOTE — Anesthesia Procedure Notes (Signed)
Procedure Name: MAC Date/Time: 12/09/2020 3:49 PM Performed by: Dorthea Cove, CRNA Pre-anesthesia Checklist: Patient identified, Emergency Drugs available, Suction available, Patient being monitored and Timeout performed Patient Re-evaluated:Patient Re-evaluated prior to induction Oxygen Delivery Method: Ambu bag Preoxygenation: Pre-oxygenation with 100% oxygen Induction Type: IV induction Ventilation: Mask ventilation without difficulty Placement Confirmation: positive ETCO2 Dental Injury: Teeth and Oropharynx as per pre-operative assessment

## 2020-12-10 DIAGNOSIS — I5023 Acute on chronic systolic (congestive) heart failure: Secondary | ICD-10-CM | POA: Diagnosis not present

## 2020-12-10 DIAGNOSIS — N179 Acute kidney failure, unspecified: Secondary | ICD-10-CM | POA: Diagnosis not present

## 2020-12-10 LAB — COOXEMETRY PANEL
Carboxyhemoglobin: 1.6 % — ABNORMAL HIGH (ref 0.5–1.5)
Methemoglobin: 0.7 % (ref 0.0–1.5)
O2 Saturation: 53 %
Total hemoglobin: 8.5 g/dL — ABNORMAL LOW (ref 12.0–16.0)

## 2020-12-10 LAB — RENAL FUNCTION PANEL
Albumin: 2.8 g/dL — ABNORMAL LOW (ref 3.5–5.0)
Anion gap: 18 — ABNORMAL HIGH (ref 5–15)
BUN: 37 mg/dL — ABNORMAL HIGH (ref 8–23)
CO2: 23 mmol/L (ref 22–32)
Calcium: 10 mg/dL (ref 8.9–10.3)
Chloride: 92 mmol/L — ABNORMAL LOW (ref 98–111)
Creatinine, Ser: 6.45 mg/dL — ABNORMAL HIGH (ref 0.44–1.00)
GFR, Estimated: 7 mL/min — ABNORMAL LOW (ref >60)
Glucose, Bld: 137 mg/dL — ABNORMAL HIGH (ref 70–99)
Phosphorus: 6.2 mg/dL — ABNORMAL HIGH (ref 2.5–4.6)
Potassium: 3.7 mmol/L (ref 3.5–5.1)
Sodium: 133 mmol/L — ABNORMAL LOW (ref 135–145)

## 2020-12-10 LAB — APTT: aPTT: 81 seconds — ABNORMAL HIGH (ref 24–36)

## 2020-12-10 LAB — CBC
HCT: 26.3 % — ABNORMAL LOW (ref 36.0–46.0)
Hemoglobin: 8.3 g/dL — ABNORMAL LOW (ref 12.0–15.0)
MCH: 33.3 pg (ref 26.0–34.0)
MCHC: 31.6 g/dL (ref 30.0–36.0)
MCV: 105.6 fL — ABNORMAL HIGH (ref 80.0–100.0)
Platelets: 135 10*3/uL — ABNORMAL LOW (ref 150–400)
RBC: 2.49 MIL/uL — ABNORMAL LOW (ref 3.87–5.11)
RDW: 15.7 % — ABNORMAL HIGH (ref 11.5–15.5)
WBC: 5.8 10*3/uL (ref 4.0–10.5)
nRBC: 0.7 % — ABNORMAL HIGH (ref 0.0–0.2)

## 2020-12-10 LAB — GLUCOSE, CAPILLARY
Glucose-Capillary: 98 mg/dL (ref 70–99)
Glucose-Capillary: 122 mg/dL — ABNORMAL HIGH (ref 70–99)

## 2020-12-10 LAB — MAGNESIUM: Magnesium: 1.9 mg/dL (ref 1.7–2.4)

## 2020-12-10 MED ORDER — ANTICOAGULANT SODIUM CITRATE 4% (200MG/5ML) IV SOLN
5.0000 mL | Freq: Once | Status: AC
  Administered 2020-12-10: 2.4 mL
  Filled 2020-12-10: qty 5

## 2020-12-10 NOTE — Progress Notes (Signed)
ANTICOAGULATION CONSULT NOTE - Follow-Up Consult  Pharmacy Consult for bivalirudin Indication: atrial fibrillation  Patient Measurements: Height: 5' 3.5" (161.3 cm) Weight: 125.4 kg (276 lb 7.3 oz) IBW/kg (Calculated) : 53.55 Heparin Dosing Weight: 85 kg  Vital Signs: Temp: 98 F (36.7 C) (08/20 0400) Temp Source: Axillary (08/20 0400) BP: 104/73 (08/20 0745) Pulse Rate: 106 (08/20 0745)  Labs: Recent Labs    12/08/20 0543 12/08/20 1344 12/09/20 0425 12/10/20 0355  HGB 8.4*  --  8.2* 8.3*  HCT 27.3*  --  25.7* 26.3*  PLT 73*  --  88* 135*  APTT 80* 82* 68* 81*  CREATININE 7.76*  --  4.44* 6.45*     Estimated Creatinine Clearance: 11.4 mL/min (A) (by C-G formula based on SCr of 6.45 mg/dL (H)).   Medical History: Past Medical History:  Diagnosis Date   A-fib ([place])    Cardiomyopathy    Hyperlipidemia    Hypertension    Lupus ([place])    Pulmonary hypertension ([place]) 05/10/2011   Echo, EF-40-45   Renal disorder    Sleep apnea 2008   CPAP, pt does not know settings   Thyroid disease     Medications:     Assessment: 65 y.o. female with  h/o Afib, DOAC on hold, for heparin.  Pt was on Xarelto PTA and changed to Eliquis due to procedures.  Eliquis 5 mg last given at 2317 7/31.   Hit antibody negative (0.087). 8/17 Discussed with primary team given significant decline in platelet count will go ahead and change over to bival and send out a SRA which will take several days to result.   APTT came back therapeutic at 81, on bival@0 .03 mg/kg/hr. Plt count up to 135 today. Hgb low but stable at 8.3. No bleeding issues noted. SRA in process from 8/17.   Goal of Therapy:  Aptt goal 50-85s Monitor platelets by anticoagulation protocol: Yes   Plan:  Continue bival 0.03 mg/kg/hr Daily aPTT and CBC Follow up [place] PharmD., BCPS Clinical Pharmacist 12/10/2020 8:04 AM

## 2020-12-10 NOTE — Plan of Care (Signed)
  Problem: Education: Goal: Ability to demonstrate management of disease process will improve Outcome: Progressing Goal: Ability to verbalize understanding of medication therapies will improve Outcome: Progressing   Problem: Activity: Goal: Capacity to carry out activities will improve Outcome: Progressing   Problem: Cardiac: Goal: Ability to achieve and maintain adequate cardiopulmonary perfusion will improve Outcome: Progressing   Problem: Education: Goal: Knowledge of General Education information will improve Description: Including pain rating scale, medication(s)/side effects and non-pharmacologic comfort measures Outcome: Progressing   Problem: Health Behavior/Discharge Planning: Goal: Ability to manage health-related needs will improve Outcome: Progressing   Problem: Clinical Measurements: Goal: Ability to maintain clinical measurements within normal limits will improve Outcome: Progressing Goal: Will remain free from infection Outcome: Progressing Goal: Diagnostic test results will improve Outcome: Progressing Goal: Respiratory complications will improve Outcome: Progressing Goal: Cardiovascular complication will be avoided Outcome: Progressing   Problem: Activity: Goal: Risk for activity intolerance will decrease Outcome: Progressing   Problem: Nutrition: Goal: Adequate nutrition will be maintained Outcome: Progressing

## 2020-12-10 NOTE — Progress Notes (Signed)
[facility] NEPHROLOGY PROGRESS NOTE  Assessment/ Plan:  # Acute kidney Injury on chronic kidney disease stage IV: Secondary to cardiorenal syndrome initially required CRRT after inadequate response to diuretics.  Now she is on intermittent hemodialysis.  Receiving dialysis this morning and has been tolerating well so far with mild tachycardia and soft BP.  Clinically asymptomatic.  If she completes dialysis without event then she will probably need tunnel catheter early next week. She is aware that if this fails, she will be transitioning to hospice care. No sign of renal recovery.   # Acute exacerbation of congestive heart failure: She remains on dobutamine for inotropic support/cardiogenic shock and is currently off of pressors.  On midodrine 15 mg 3 times daily.    # Atrial fibrillation with rapid ventricular response: Managed with amiodarone.  Monitor heart rate.  On anticoagulation.  # Hyponatremia: Secondary to acute exacerbation of congestive heart failure/worsening renal function and impaired free water handling.  Reminded about fluid restriction.  Ultrafiltration with HD.  # Anemia of chronic disease/chronic kidney disease: Iron stores are repleted and she has received ESA.  Monitor hemoglobin.  Subjective: Seen and examined at bedside.  Currently receiving dialysis and tolerating well.  Denies nausea vomiting chest pain shortness of breath.  No new event. Objective Vital signs in last 24 hours: Vitals:   12/10/20 0715 12/10/20 0730 12/10/20 0745 12/10/20 0800  BP: 103/68 103/71 104/73 107/82  Pulse: (!) 105 (!) 108 (!) 106 (!) 106  Resp: (!) 22 (!) 21 (!) 24 (!) 22  Temp: (!) 97.5 F (36.4 C) (!) 97.5 F (36.4 C)  98 F (36.7 C)  TempSrc: Oral Oral  Oral  SpO2: 95% 99% 98% 99%  Weight: 125.4 kg     Height:       Weight change: 6 kg  Intake/Output Summary (Last 24 hours) at 12/10/2020 0820 Last data filed at 12/10/2020 0701 Gross per 24 hour  Intake  1161.01 ml  Output --  Net 1161.01 ml        Labs: Basic Metabolic Panel: Recent Labs  Lab 12/04/20 0407 12/05/20 0403 12/08/20 0543 12/09/20 0425 12/10/20 0355  NA 132*  132*   < > 130* 134* 133*  K 3.9  3.9   < > 3.9 3.6 3.7  CL 96*  96*   < > 93* 95* 92*  CO2 22  22   < > 21* 26 23  GLUCOSE 100*  101*   < > 111* 108* 137*  BUN 29*  29*   < > 65* 24* 37*  CREATININE 3.76*  3.79*   < > 7.76* 4.44* 6.45*  CALCIUM 10.0  10.0   < > 9.4 9.2 10.0  PHOS 4.1  --   --  3.7 6.2*   < > = values in this interval not displayed.    Liver Function Tests: Recent Labs  Lab 12/04/20 0407 12/09/20 0425 12/10/20 0355  ALBUMIN 2.9* 2.7* 2.8*    No results for input(s): LIPASE, AMYLASE in the last 168 hours. No results for input(s): AMMONIA in the last 168 hours. CBC: Recent Labs  Lab 12/06/20 0424 12/07/20 0706 12/08/20 0543 12/09/20 0425 12/10/20 0355  WBC 9.6 7.7 5.3 5.4 5.8  HGB 9.0* 9.2* 8.4* 8.2* 8.3*  HCT 28.5* 28.7* 27.3* 25.7* 26.3*  MCV 102.5* 101.4* 104.6* 102.8* 105.6*  PLT 104* 62* 73* 88* 135*    Cardiac Enzymes: No results for input(s): CKTOTAL, CKMB, CKMBINDEX, TROPONINI in the last 168 hours.  CBG: Recent Labs  Lab 12/08/20 2126 12/09/20 0624 12/09/20 1115 12/09/20 2147 12/10/20 0633  GLUCAP 129* 121* 164* 113* 98     Iron Studies: No results for input(s): IRON, TIBC, TRANSFERRIN, FERRITIN in the last 72 hours. Studies/Results: No results found.  Medications: Infusions:  sodium chloride     sodium chloride     sodium chloride     amiodarone 60 mg/hr (12/10/20 0800)   anticoagulant sodium citrate     bivalirudin (ANGIOMAX) infusion 0.5 mg/mL (Non-ACS indications) 0.03 mg/kg/hr (12/10/20 0800)   DOBUTamine 3 mcg/kg/min (12/10/20 0800)   norepinephrine (LEVOPHED) Adult infusion Stopped (12/05/20 0519)    Scheduled Medications:  (feeding supplement) PROSource Plus  30 mL Oral BID BM   calcitRIOL  0.25 mcg Oral Daily    Chlorhexidine Gluconate Cloth  6 each Topical Daily   clotrimazole  1 application Topical BID   estradiol  1 mg Oral Daily   febuxostat  40 mg Oral q AM   ferrous sulfate  325 mg Oral q morning   fluticasone  2 spray Each Nare Daily   fluticasone furoate-vilanterol  1 puff Inhalation Daily   gabapentin  100 mg Oral TID   insulin aspart  0-6 Units Subcutaneous TID WC   levothyroxine  88 mcg Oral q morning   lidocaine  1 patch Transdermal Q24H   midodrine  15 mg Oral TID WC   multivitamin  1 tablet Oral QHS   predniSONE  5 mg Oral Q breakfast   senna-docusate  2 tablet Oral BID   sodium chloride flush  10-40 mL Intracatheter Q12H   sodium chloride flush  3 mL Intravenous Q12H    have reviewed scheduled and prn medications.  Physical Exam: General: Lying on bed and on dialysis, not in distress Heart: Irregular heart rate, s1s2 nl Lungs: Clear anteriorly, no wheezing Abdomen:soft, Non-tender, non-distended Extremities: Trace LE edema present Dialysis Access: Right IJ temporary HD catheter in place  Lorry Anastasi Prasad Esty Ahuja 12/10/2020,8:20 AM  LOS: 20 days

## 2020-12-10 NOTE — Progress Notes (Signed)
Patient ID: Caitlyn Fuentes, female   DOB: 27-Apr-1955, 65 y.o.   MRN: [id number]     Advanced Heart Failure Rounding Note  PCP-Cardiologist: Shelva Majestic, MD  HF clinic: Dr. Haroldine Laws  Subjective:    Events: - 8/1 A fib RVR & A/C systolic heart faiure --> cardiogenic shock. Had urgent DC-CV with brief conversion to NSR but went back in A fib. . On Norepi + milrinone. AKI. Nephrology consulted.  - 8/2 A fib RVR, amio gtt. Continued on milrinone 0.375 mcg + norepi 2 mcg. Started on prednisone 40 mg daily x3 days for gout flare.  - 8/3 did not tolerate milrinone wean w/ drop in Co-ox 68%--->48% and decrease in UOP. Milrinone increased back to 0.375.  Lasix gtt increased to 30/hr.  - 8/4 NE added back given persistently low Co-ox - 8/4 s/p TEE/DCCV>>NSR  - 8/5 back in Afib w/ RVR  -  08/09 HD cath placed, CRRT started - 8/10: conversion to sinus tachycardia  - 8/13 CRRT stopped - 8/15 recurrent AF - 8/16 tolerated iHD - 8/18 tolerated iHD  - 8/19 DCCV to NSR  Patient undergoing HD currently, goal net 3L removal.  BP stable, remains on dobutamine 3 with marginal co-ox 53%.  CVP 18 currently.   She is in NSR, 100s.   No complaints, not short of breath.    Objective:   Weight Range: 125.4 kg Body mass index is 48.2 kg/m.   Vital Signs:   Temp:  [97.5 F (36.4 C)-98.7 F (37.1 C)] 98 F (36.7 C) (08/20 0800) Pulse Rate:  [71-144] 106 (08/20 0815) Resp:  [8-36] 22 (08/20 0815) BP: (66-151)/(39-123) 112/83 (08/20 0815) SpO2:  [91 %-100 %] 95 % (08/20 0815) Weight:  [125.4 kg] 125.4 kg (08/20 0715) Last BM Date: 12/09/20  Weight change: Filed Weights   12/09/20 0600 12/10/20 0600 12/10/20 0715  Weight: 123.3 kg 125.4 kg 125.4 kg    Intake/Output:   Intake/Output Summary (Last 24 hours) at 12/10/2020 0831 Last data filed at 12/10/2020 0701 Gross per 24 hour  Intake 1161.01 ml  Output --  Net 1161.01 ml     Physical Exam   CVP 18 General: NAD Neck: JVP 16+, no  thyromegaly or thyroid nodule.  Lungs: Clear to auscultation bilaterally with normal respiratory effort. CV: Nondisplaced PMI.  Heart mildly tachy, regular S1/S2, no S3/S4, no murmur.  1+ ankle edema.  Abdomen: Soft, nontender, no hepatosplenomegaly, no distention.  Skin: Intact without lesions or rashes.  Neurologic: Alert and oriented x 3.  Psych: Normal affect. Extremities: No clubbing or cyanosis.  HEENT: Normal.    Telemetry   NSR 100s. Personally reviewed  Labs    CBC Recent Labs    12/09/20 0425 12/10/20 0355  WBC 5.4 5.8  HGB 8.2* 8.3*  HCT 25.7* 26.3*  MCV 102.8* 105.6*  PLT 88* 417*   Basic Metabolic Panel Recent Labs    12/09/20 0425 12/10/20 0355  NA 134* 133*  K 3.6 3.7  CL 95* 92*  CO2 26 23  GLUCOSE 108* 137*  BUN 24* 37*  CREATININE 4.44* 6.45*  CALCIUM 9.2 10.0  MG 2.1 1.9  PHOS 3.7 6.2*   Liver Function Tests Recent Labs    12/09/20 0425 12/10/20 0355  ALBUMIN 2.7* 2.8*    No results for input(s): LIPASE, AMYLASE in the last 72 hours. Cardiac Enzymes No results for input(s): CKTOTAL, CKMB, CKMBINDEX, TROPONINI in the last 72 hours.  BNP: BNP (last 3 results) Recent Labs  11/20/20 0318 11/21/20 0258  BNP 2,051.3* 2,022.4*    ProBNP (last 3 results) No results for input(s): PROBNP in the last 8760 hours.   D-Dimer No results for input(s): DDIMER in the last 72 hours. Hemoglobin A1C No results for input(s): HGBA1C in the last 72 hours.  Fasting Lipid Panel No results for input(s): CHOL, HDL, LDLCALC, TRIG, CHOLHDL, LDLDIRECT in the last 72 hours. Thyroid Function Tests No results for input(s): TSH, T4TOTAL, T3FREE, THYROIDAB in the last 72 hours.  Invalid input(s): FREET3   Other results:   Imaging    No results found.   Medications:     Scheduled Medications:  (feeding supplement) PROSource Plus  30 mL Oral BID BM   calcitRIOL  0.25 mcg Oral Daily   Chlorhexidine Gluconate Cloth  6 each Topical Daily    clotrimazole  1 application Topical BID   estradiol  1 mg Oral Daily   febuxostat  40 mg Oral q AM   ferrous sulfate  325 mg Oral q morning   fluticasone  2 spray Each Nare Daily   fluticasone furoate-vilanterol  1 puff Inhalation Daily   gabapentin  100 mg Oral TID   insulin aspart  0-6 Units Subcutaneous TID WC   levothyroxine  88 mcg Oral q morning   lidocaine  1 patch Transdermal Q24H   midodrine  15 mg Oral TID WC   multivitamin  1 tablet Oral QHS   predniSONE  5 mg Oral Q breakfast   senna-docusate  2 tablet Oral BID   sodium chloride flush  10-40 mL Intracatheter Q12H   sodium chloride flush  3 mL Intravenous Q12H    Infusions:  sodium chloride     sodium chloride     sodium chloride     amiodarone 60 mg/hr (12/10/20 0800)   anticoagulant sodium citrate     bivalirudin (ANGIOMAX) infusion 0.5 mg/mL (Non-ACS indications) 0.03 mg/kg/hr (12/10/20 0800)   DOBUTamine 3 mcg/kg/min (12/10/20 0800)   norepinephrine (LEVOPHED) Adult infusion Stopped (12/05/20 0519)    PRN Medications: sodium chloride, sodium chloride, sodium chloride, acetaminophen, albuterol, alteplase, calcium carbonate, cyclobenzaprine, HYDROcodone-acetaminophen, hydrocortisone cream, ondansetron (ZOFRAN) IV, oxyCODONE, sodium chloride flush, sodium chloride flush    Patient Profile   Caitlyn Fuentes is a 65 year old with a history of chronic systolic hf previously EF 45%,  NICM, hypothyroid, PAF, gout, OSA, CKD Stage IV, HTN, hyperlipidemia, and lupus.   Admitted with A fib RVR and A/C systolic heart failure complicated by cardiogenic shock.     Assessment/Plan   1. Acute/Chronic Systolic Heart Failure -> Cardiogenic shock in setting AF with RVR - 8/1 Lactic acid 1.5. CO-OX 43%.--> started on milrinone and NE - Echo 7/22 EF 30-35% (2021)  This admit -> 25-30%. - NE stopped 8/12, then resumed briefly the morning of 8/15 -- later weaned off.  - CVVHD stopped 8/13. Tolerating iHD but remains fluid  overloaded.   - Unable to tolerate GDMT due to hypotension and AKI - Not candidate for advanced therapies with size and ESRD. If unable to tolerate iHD off pressors/ fails milrinone wean would require Hospice evaluation - Palliative care now following  - Getting HD this morning, CVP 18 with co-ox still marginal at 53%.  Continue dobutamine 3 for now.  Would favor HD again tomorrow for volume management.    2. PAF--> Afib RVR - Developed shock and had urgent cardioversion 8/1 with conversion to NSR w/ ERAF - Repeat DCCV 8/4 w/ ERAF - Conversion to  sinus tachycardia on 8/10 with IV amio - Afib again starting 8/17, DCCV on 8/19.  - unfortunately, not a candidate for ablation due to size if AF were to recur. Not candidate for AVN ablation and CRT. - plt down so she was switched to Bival. Platelets trending up.  - SRA pending.   - Remains in NSR today, can decrease amiodarone to 30 mg/hr.    3. AKI on CKD Stage IV -> ESRD - Baseline SCr ~3.2, now up to 5.84 -> 7.33 - Renal US with no acute findings - Off CRRT on 8/13. No response to high-dose lasix. Now on iHD but struggling to keep fluid down. May need to consider hospice  - Will keep in ICU while we are determining if she can tolerate iHD. Doing well so far on HD this morning, with high CVP would recommend HD again tomorrow if possible.    4. OSA - Uses CPAP, but refused overnight    5. Hypothyroidism -TSH 8. T3 ok and T4 2.3  -On synthroid. Dose increased to 88 mcg -Repeat TSH in 6 weeks.    6. Gout - Uric acid 10. Pain RLE ? Flare  - On uloric 40 mg daily  - Completed 3 day course of prednisone    7. Hypokalemia -- now resolved -Stable.   8. Deconditioning - Continue PT/OT--> CIR following.  - needs HHPT. ? SNF   9 Lupus - on low-dose prednisone.  10. Thrombocytopenia - Plt count of 121 -> 104 -- >62 switched from heparin drip to bival-->73->88 -> 135 today  - HIT panel negative.  - SRA pending.  - No active bleeding  noted  - Remains on bivalirudin for now.   CRITICAL CARE Performed by: Loralie Champagne  Total critical care time: 35 minutes  Critical care time was exclusive of separately billable procedures and treating other patients.  Critical care was necessary to treat or prevent imminent or life-threatening deterioration.  Critical care was time spent personally by me (independent of midlevel providers or residents) on the following activities: development of treatment plan with patient and/or surrogate as well as nursing, discussions with consultants, evaluation of patient's response to treatment, examination of patient, obtaining history from patient or surrogate, ordering and performing treatments and interventions, ordering and review of laboratory studies, ordering and review of radiographic studies, pulse oximetry and re-evaluation of patient's condition.  Loralie Champagne, MD  8:31 AM 12/10/2020

## 2020-12-10 NOTE — Progress Notes (Signed)
PT Cancellation Note  Patient Details Name: Caitlyn Fuentes MRN: [id number] DOB: 12/29/1955   Cancelled Treatment:    Reason Eval/Treat Not Completed: Patient at procedure or test/unavailable. Pt undergoing HD. PT to re-attempt as time allows.   Lorriane Shire 12/10/2020, 8:50 AM  Lorrin Goodell, PT  Office # [phone] Pager [phone]

## 2020-12-11 DIAGNOSIS — Z515 Encounter for palliative care: Secondary | ICD-10-CM | POA: Diagnosis not present

## 2020-12-11 DIAGNOSIS — N179 Acute kidney failure, unspecified: Secondary | ICD-10-CM | POA: Diagnosis not present

## 2020-12-11 DIAGNOSIS — I4891 Unspecified atrial fibrillation: Secondary | ICD-10-CM | POA: Diagnosis not present

## 2020-12-11 DIAGNOSIS — I5023 Acute on chronic systolic (congestive) heart failure: Secondary | ICD-10-CM | POA: Diagnosis not present

## 2020-12-11 LAB — MAGNESIUM: Magnesium: 2 mg/dL (ref 1.7–2.4)

## 2020-12-11 LAB — GLUCOSE, CAPILLARY
Glucose-Capillary: 86 mg/dL (ref 70–99)
Glucose-Capillary: 115 mg/dL — ABNORMAL HIGH (ref 70–99)
Glucose-Capillary: 137 mg/dL — ABNORMAL HIGH (ref 70–99)
Glucose-Capillary: 102 mg/dL — ABNORMAL HIGH (ref 70–99)

## 2020-12-11 LAB — RENAL FUNCTION PANEL
Albumin: 2.7 g/dL — ABNORMAL LOW (ref 3.5–5.0)
Anion gap: 14 (ref 5–15)
BUN: 24 mg/dL — ABNORMAL HIGH (ref 8–23)
CO2: 26 mmol/L (ref 22–32)
Calcium: 9.5 mg/dL (ref 8.9–10.3)
Chloride: 93 mmol/L — ABNORMAL LOW (ref 98–111)
Creatinine, Ser: 5.01 mg/dL — ABNORMAL HIGH (ref 0.44–1.00)
GFR, Estimated: 9 mL/min — ABNORMAL LOW (ref >60)
Glucose, Bld: 90 mg/dL (ref 70–99)
Phosphorus: 4.6 mg/dL (ref 2.5–4.6)
Potassium: 3.7 mmol/L (ref 3.5–5.1)
Sodium: 133 mmol/L — ABNORMAL LOW (ref 135–145)

## 2020-12-11 LAB — COOXEMETRY PANEL
Carboxyhemoglobin: 1.6 % — ABNORMAL HIGH (ref 0.5–1.5)
Methemoglobin: 0.7 % (ref 0.0–1.5)
O2 Saturation: 51.1 %
Total hemoglobin: 10.8 g/dL — ABNORMAL LOW (ref 12.0–16.0)

## 2020-12-11 LAB — APTT: aPTT: 68 seconds — ABNORMAL HIGH (ref 24–36)

## 2020-12-11 MED ORDER — CHLORHEXIDINE GLUCONATE CLOTH 2 % EX PADS
6.0000 | MEDICATED_PAD | Freq: Every day | CUTANEOUS | Status: DC
  Administered 2020-12-13 – 2020-12-17 (×5): 6 via TOPICAL

## 2020-12-11 MED ORDER — HEPARIN SODIUM (PORCINE) 1000 UNIT/ML IJ SOLN
INTRAMUSCULAR | Status: AC
  Administered 2020-12-11: 1000 [IU]
  Filled 2020-12-11: qty 4

## 2020-12-11 NOTE — Progress Notes (Signed)
Daily Progress Note   Patient Name: Caitlyn Fuentes       Date: 12/11/2020 DOB: 06/19/1955  Age: 65 y.o. MRN#: [id number] Attending Physician: Jolaine Artist, MD Primary Care Physician: Guadlupe Spanish, MD Admit Date: 11/20/2020  Reason for Consultation/Follow-up: Establishing goals of care  Subjective: Medical records reviewed. Discussed with RN and assessed patient at the bedside. Sitting up in chair on room air. She reports feeling well. Her son Trae is at the bedside.  Introduced palliative care as specialized medical care for people living with serious illness. It focuses on providing relief from the symptoms and stress of a serious illness. Trae shares that family is currently looking into assistance with medicaid benefits and possible enrollment in the PACE program upon discharge. He mentions patient's needs, including transportation and adult day care, and his concern that patient is not resting well when visitors are stopping by. He is very hopeful for patient's improvement and the rest of the family is as well. He clarifies that he will be leaving town later today and calling in to a conference call with patient and attorneys tomorrow at [place] to discuss medicaid further.  We reviewed patient's current illness and barriers to discharge, including the challenge of effective volume management with dialysis when patient's blood pressure is low. He is surprised to hear that this is such a tenuous process with high risk for decompensation. Discussed importance of rehabilitation and options such as SNF vs CIR. Counseled on the importance of planning for the worst while hoping for the best. Trae agrees with the importance of open dialogue with family and requests a family meeting on Thursday  or Friday of this week.  Questions and concerns addressed. Hard Choices booklet provided for review. PMT will continue to support holistically.  Length of Stay: 21  Current Medications: Scheduled Meds:   (feeding supplement) PROSource Plus  30 mL Oral BID BM   calcitRIOL  0.25 mcg Oral Daily   Chlorhexidine Gluconate Cloth  6 each Topical Daily   Chlorhexidine Gluconate Cloth  6 each Topical Q0600   clotrimazole  1 application Topical BID   estradiol  1 mg Oral Daily   febuxostat  40 mg Oral q AM   ferrous sulfate  325 mg Oral q morning   fluticasone  2 spray Each Nare Daily  fluticasone furoate-vilanterol  1 puff Inhalation Daily   gabapentin  100 mg Oral TID   heparin sodium (porcine)       insulin aspart  0-6 Units Subcutaneous TID WC   levothyroxine  88 mcg Oral q morning   lidocaine  1 patch Transdermal Q24H   midodrine  15 mg Oral TID WC   multivitamin  1 tablet Oral QHS   predniSONE  5 mg Oral Q breakfast   senna-docusate  2 tablet Oral BID   sodium chloride flush  10-40 mL Intracatheter Q12H   sodium chloride flush  3 mL Intravenous Q12H    Continuous Infusions:  sodium chloride     sodium chloride     sodium chloride     amiodarone 30 mg/hr (12/11/20 1145)   bivalirudin (ANGIOMAX) infusion 0.5 mg/mL (Non-ACS indications) 0.03 mg/kg/hr (12/11/20 1000)   DOBUTamine 3 mcg/kg/min (12/11/20 1000)   norepinephrine (LEVOPHED) Adult infusion Stopped (12/05/20 0519)    PRN Meds: sodium chloride, sodium chloride, sodium chloride, acetaminophen, albuterol, alteplase, calcium carbonate, cyclobenzaprine, HYDROcodone-acetaminophen, hydrocortisone cream, ondansetron (ZOFRAN) IV, oxyCODONE, sodium chloride flush, sodium chloride flush  Physical Exam Vitals and nursing note reviewed.  Constitutional:      General: She is not in acute distress.    Appearance: She is ill-appearing.  Cardiovascular:     Rate and Rhythm: Regular rhythm. Tachycardia present.  Pulmonary:      Effort: Tachypnea present.  Neurological:     Mental Status: She is alert and oriented to person, place, and time.  Psychiatric:        Behavior: Behavior is cooperative.            Vital Signs: BP 98/78   Pulse (!) 106   Temp 98.6 F (37 C) (Axillary)   Resp 19   Ht 5' 3.5" (1.613 m)   Wt 122.4 kg   SpO2 97%   BMI 47.05 kg/m  SpO2: SpO2: 97 % O2 Device: O2 Device: Room Air O2 Flow Rate: O2 Flow Rate (L/min): 2 L/min  Intake/output summary:  Intake/Output Summary (Last 24 hours) at 12/11/2020 1507 Last data filed at 12/11/2020 0901 Gross per 24 hour  Intake 1022.45 ml  Output --  Net 1022.45 ml    LBM: Last BM Date: 12/11/20 Baseline Weight: Weight: 119.7 kg Most recent weight: Weight: 122.4 kg       Palliative Assessment/Data: 30-40%      Patient Active Problem List   Diagnosis Date Noted   Goals of care, counseling/discussion    Palliative care by specialist    Atrial fibrillation with rapid ventricular response ([place]) 11/20/2020   CHF (congestive heart failure) ([place]) 11/20/2020   CKD (chronic kidney disease) stage 4, GFR 15-29 ml/min ([place]) 10/16/2019   Acute renal failure superimposed on stage 4 chronic kidney disease ([place]) 10/16/2019   Acute on chronic systolic heart failure ([place]) 10/16/2019   Gout due to renal impairment 10/16/2019   Morbid obesity ([place]) 10/16/2019   Hypokalemia 10/15/2019   CKD (chronic kidney disease) stage 3, GFR 30-59 ml/min ([facility]) 08/28/2016   Dizziness 08/17/2016   Acute hypokalemia 08/17/2016   Acute hyponatremia 08/17/2016   Chest tightness 08/17/2016   Near syncope 08/17/2016   Nonischemic cardiomyopathy ([place]) 05/25/2015   Chronic combined systolic and diastolic heart failure ([place]) 05/25/2015   Palpitations 05/25/2015   Gout 10/16/2013   Hyperlipidemia 10/16/2013   Long term current use of anticoagulant therapy 07/20/2013   Chest pain, atypical 08/16/2011   Morbid obesity ([place]) 08/16/2011  Bilateral lower extremity edema  08/16/2011   Obstructive sleep apnea on CPAP 08/16/2011   Paroxysmal atrial fibrillation ([place]) 08/16/2011    Palliative Care Assessment & Plan   Patient Profile:  66 y.o. female  with past medical history of PAF on systemic anticoagulation, chronic systolic CHF secondary to nonischemic cardiomyopathy, CKD stage IV, SLE on chronic steroids, HTN, COPD, pulmonary hypertension, gout, hypothyroidism admitted on 11/20/2020 with worsening of palpitations and shortness of breath and leg swelling.   Patient was found to have afib with RVR, acute on chronic systolic heart failure, cardiogenic shock, and AKI due to cardiorenal syndrome requiring CRRT. She is now off pressors and undergoing trials of iHD. Not a candidate for advanced HF therapies or ablation. Palliative medicine has been consulted to assist with goals of care conversation.  Assessment: Goals of care conversation ESRD on HD Acute on chronic systolic heart failure Deconditioning  Recommendations/Plan: Continue full code/full scope Patient's son Trae shares that Friday 8/26 will likely work best for a family meeting in [place] phone, he will coordinate and inform PMT of a finalized date/time Informed RN of patient and family's request for minimal interruptions tomorrow 8/22 at [place] during conference call with attorneys, family understands this may not be feasible Psychosocial and emotional support provided Ongoing support from PMT  Goals of Care and Additional Recommendations: Limitations on Scope of Treatment: Full Scope Treatment  Prognosis:  Guarded  Discharge Planning: To Be Determined  Care plan was discussed with Patient, patient's son Trae  Total time: 40 minutes Greater than 50% of this time was spent in counseling and coordinating care related to the above assessment and plan.  Dorthy Cooler, PA-C Palliative Medicine Team Team phone # [phone]  Thank you for allowing the Palliative Medicine Team to assist  in the care of this patient. Please utilize secure chat with additional questions, if there is no response within 30 minutes please call the above phone number.  Palliative Medicine Team providers are available by phone from 7am to 7pm daily and can be reached through the team cell phone.  Should this patient require assistance outside of these hours, please call the patient's attending physician.

## 2020-12-11 NOTE — Progress Notes (Signed)
Pt has home cpap.  Water added to chamber.

## 2020-12-11 NOTE — Progress Notes (Signed)
Patient ID: Caitlyn Fuentes, female   DOB: August 11, 1955, 65 y.o.   MRN: [id number]     Advanced Heart Failure Rounding Note  PCP-Cardiologist: Shelva Majestic, MD  HF clinic: Dr. Haroldine Laws  Subjective:    Events: - 8/1 A fib RVR & A/C systolic heart faiure --> cardiogenic shock. Had urgent DC-CV with brief conversion to NSR but went back in A fib. . On Norepi + milrinone. AKI. Nephrology consulted.  - 8/2 A fib RVR, amio gtt. Continued on milrinone 0.375 mcg + norepi 2 mcg. Started on prednisone 40 mg daily x3 days for gout flare.  - 8/3 did not tolerate milrinone wean w/ drop in Co-ox 68%--->48% and decrease in UOP. Milrinone increased back to 0.375.  Lasix gtt increased to 30/hr.  - 8/4 NE added back given persistently low Co-ox - 8/4 s/p TEE/DCCV>>NSR  - 8/5 back in Afib w/ RVR  -  08/09 HD cath placed, CRRT started - 8/10: conversion to sinus tachycardia  - 8/13 CRRT stopped - 8/15 recurrent AF - 8/16 tolerated iHD - 8/18 tolerated iHD  - 8/19 DCCV to NSR  HD yesterday, weight down.  Tolerated well.  Today, co-ox 51% on dobutamine 3 with CVP 20.   She is in NSR, 100s.   No complaints, not short of breath.    Objective:   Weight Range: 122.4 kg Body mass index is 47.05 kg/m.   Vital Signs:   Temp:  [97.8 F (36.6 C)-98.7 F (37.1 C)] 97.8 F (36.6 C) (08/21 0721) Pulse Rate:  [96-113] 107 (08/21 0700) Resp:  [14-32] 17 (08/21 0700) BP: (67-130)/(48-101) 95/68 (08/21 0700) SpO2:  [90 %-100 %] 98 % (08/21 0700) Weight:  [122.4 kg] 122.4 kg (08/20 1030) Last BM Date: 12/09/20  Weight change: Filed Weights   12/10/20 0600 12/10/20 0715 12/10/20 1030  Weight: 125.4 kg 125.4 kg 122.4 kg    Intake/Output:   Intake/Output Summary (Last 24 hours) at 12/11/2020 0807 Last data filed at 12/11/2020 0700 Gross per 24 hour  Intake 1423.36 ml  Output 2500 ml  Net -1076.64 ml     Physical Exam   CVP 20 General: NAD Neck: JVP 16+, no thyromegaly or thyroid nodule.   Lungs: Clear to auscultation bilaterally with normal respiratory effort. CV: Nondisplaced PMI.  Heart regular S1/S2, no S3/S4, no murmur.  Trace ankle edema.  Abdomen: Soft, nontender, no hepatosplenomegaly, no distention.  Skin: Intact without lesions or rashes.  Neurologic: Alert and oriented x 3.  Psych: Normal affect. Extremities: No clubbing or cyanosis.  HEENT: Normal.     Telemetry   NSR 100s. Personally reviewed  Labs    CBC Recent Labs    12/09/20 0425 12/10/20 0355  WBC 5.4 5.8  HGB 8.2* 8.3*  HCT 25.7* 26.3*  MCV 102.8* 105.6*  PLT 88* 427*   Basic Metabolic Panel Recent Labs    12/10/20 0355 12/11/20 0536  NA 133* 133*  K 3.7 3.7  CL 92* 93*  CO2 23 26  GLUCOSE 137* 90  BUN 37* 24*  CREATININE 6.45* 5.01*  CALCIUM 10.0 9.5  MG 1.9 2.0  PHOS 6.2* 4.6   Liver Function Tests Recent Labs    12/10/20 0355 12/11/20 0536  ALBUMIN 2.8* 2.7*    No results for input(s): LIPASE, AMYLASE in the last 72 hours. Cardiac Enzymes No results for input(s): CKTOTAL, CKMB, CKMBINDEX, TROPONINI in the last 72 hours.  BNP: BNP (last 3 results) Recent Labs    11/20/20 0318 11/21/20 0258  BNP 2,051.3* 2,022.4*    ProBNP (last 3 results) No results for input(s): PROBNP in the last 8760 hours.   D-Dimer No results for input(s): DDIMER in the last 72 hours. Hemoglobin A1C No results for input(s): HGBA1C in the last 72 hours.  Fasting Lipid Panel No results for input(s): CHOL, HDL, LDLCALC, TRIG, CHOLHDL, LDLDIRECT in the last 72 hours. Thyroid Function Tests No results for input(s): TSH, T4TOTAL, T3FREE, THYROIDAB in the last 72 hours.  Invalid input(s): FREET3   Other results:   Imaging    No results found.   Medications:     Scheduled Medications:  (feeding supplement) PROSource Plus  30 mL Oral BID BM   calcitRIOL  0.25 mcg Oral Daily   Chlorhexidine Gluconate Cloth  6 each Topical Daily   clotrimazole  1 application Topical BID    estradiol  1 mg Oral Daily   febuxostat  40 mg Oral q AM   ferrous sulfate  325 mg Oral q morning   fluticasone  2 spray Each Nare Daily   fluticasone furoate-vilanterol  1 puff Inhalation Daily   gabapentin  100 mg Oral TID   insulin aspart  0-6 Units Subcutaneous TID WC   levothyroxine  88 mcg Oral q morning   lidocaine  1 patch Transdermal Q24H   midodrine  15 mg Oral TID WC   multivitamin  1 tablet Oral QHS   predniSONE  5 mg Oral Q breakfast   senna-docusate  2 tablet Oral BID   sodium chloride flush  10-40 mL Intracatheter Q12H   sodium chloride flush  3 mL Intravenous Q12H    Infusions:  sodium chloride     sodium chloride     sodium chloride     amiodarone 30 mg/hr (12/11/20 0700)   bivalirudin (ANGIOMAX) infusion 0.5 mg/mL (Non-ACS indications) 0.03 mg/kg/hr (12/11/20 0700)   DOBUTamine 3 mcg/kg/min (12/11/20 0706)   norepinephrine (LEVOPHED) Adult infusion Stopped (12/05/20 0519)    PRN Medications: sodium chloride, sodium chloride, sodium chloride, acetaminophen, albuterol, alteplase, calcium carbonate, cyclobenzaprine, HYDROcodone-acetaminophen, hydrocortisone cream, ondansetron (ZOFRAN) IV, oxyCODONE, sodium chloride flush, sodium chloride flush    Patient Profile   Ms Mull is a 65 year old with a history of chronic systolic hf previously EF 45%,  NICM, hypothyroid, PAF, gout, OSA, CKD Stage IV, HTN, hyperlipidemia, and lupus.   Admitted with A fib RVR and A/C systolic heart failure complicated by cardiogenic shock.     Assessment/Plan   1. Acute/Chronic Systolic Heart Failure -> Cardiogenic shock in setting AF with RVR - 8/1 Lactic acid 1.5. CO-OX 43%.--> started on milrinone and NE - Echo 7/22 EF 30-35% (2021)  This admit -> 25-30%. - NE stopped 8/12, then resumed briefly the morning of 8/15 -- later weaned off.  - CVVHD stopped 8/13. Tolerating iHD but remains fluid overloaded.   - Unable to tolerate GDMT due to hypotension and AKI - Not  candidate for advanced therapies with size and ESRD. If unable to tolerate iHD off pressors/ fails milrinone wean would require Hospice evaluation - Palliative care now following  - Had HD yesterday, CVP 20 with co-ox still low at 51%.  Continue dobutamine 3 for now, need to wean this off for home.  Would favor HD again today for volume management, hopefully if we can get more fluid off and improve hemodynamics, we can get her off dobutamine.  Discussed with Dr. Carolin Sicks who is going to look into staffing for HD today.    2.  PAF--> Afib RVR - Developed shock and had urgent cardioversion 8/1 with conversion to NSR w/ ERAF - Repeat DCCV 8/4 w/ ERAF - Conversion to sinus tachycardia on 8/10 with IV amio - Afib again starting 8/17, DCCV on 8/19.  - unfortunately, not a candidate for ablation due to size if AF were to recur. Not candidate for AVN ablation and CRT. - plt down so she was switched to Bival. Platelets trending up.  - SRA pending but plts better on bivalirudin.   - Remains in NSR today, continue amiodarone 30 mg/hr while on dobutamine.    3. AKI on CKD Stage IV -> ESRD - Baseline SCr ~3.2, AKI on admission to 7.33 - Renal US with no acute findings - Off CRRT on 8/13. No response to high-dose lasix. Now on iHD but struggling to keep fluid down. May need to consider hospice  - Will keep in ICU while we are determining if she can tolerate iHD. CVP still very high, needs HD today.    4. OSA - Uses CPAP, but refused overnight    5. Hypothyroidism -TSH 8. T3 ok and T4 2.3  -On synthroid. Dose increased to 88 mcg -Repeat TSH in 6 weeks.    6. Gout - Uric acid 10. Pain RLE ? Flare  - On uloric 40 mg daily  - Completed 3 day course of prednisone    7. Hypokalemia -- now resolved -Stable.   8. Deconditioning - Continue PT/OT--> CIR following.  - needs HHPT. ? SNF  - Mobilize today  9 Lupus - on low-dose prednisone.  10. Thrombocytopenia - Plt count of 121 -> 104 -- >62  switched from heparin drip to bival-->73->88 -> 135  - HIT panel negative but plts better on bivalirudin.  - SRA pending.  - No active bleeding noted  - Remains on bivalirudin for now.   CRITICAL CARE Performed by: Loralie Champagne  Total critical care time: 35 minutes  Critical care time was exclusive of separately billable procedures and treating other patients.  Critical care was necessary to treat or prevent imminent or life-threatening deterioration.  Critical care was time spent personally by me (independent of midlevel providers or residents) on the following activities: development of treatment plan with patient and/or surrogate as well as nursing, discussions with consultants, evaluation of patient's response to treatment, examination of patient, obtaining history from patient or surrogate, ordering and performing treatments and interventions, ordering and review of laboratory studies, ordering and review of radiographic studies, pulse oximetry and re-evaluation of patient's condition.  Loralie Champagne, MD  8:07 AM 12/11/2020

## 2020-12-11 NOTE — Progress Notes (Signed)
ANTICOAGULATION CONSULT NOTE - Follow-Up Consult  Pharmacy Consult for bivalirudin Indication: atrial fibrillation  Patient Measurements: Height: 5' 3.5" (161.3 cm) Weight: 122.4 kg (269 lb 13.5 oz) IBW/kg (Calculated) : 53.55 Heparin Dosing Weight: 85 kg  Vital Signs: Temp: 97.8 F (36.6 C) (08/21 0721) Temp Source: Oral (08/21 0721) BP: 95/68 (08/21 0700) Pulse Rate: 107 (08/21 0700)  Labs: Recent Labs    12/09/20 0425 12/10/20 0355 12/11/20 0536  HGB 8.2* 8.3*  --   HCT 25.7* 26.3*  --   PLT 88* 135*  --   APTT 68* 81* 68*  CREATININE 4.44* 6.45*  --      Estimated Creatinine Clearance: 11.3 mL/min (A) (by C-G formula based on SCr of 6.45 mg/dL (H)).   Medical History: Past Medical History:  Diagnosis Date   A-fib ([place])    Cardiomyopathy    Hyperlipidemia    Hypertension    Lupus ([place])    Pulmonary hypertension ([place]) 05/10/2011   Echo, EF-40-45   Renal disorder    Sleep apnea 2008   CPAP, pt does not know settings   Thyroid disease     Medications:     Assessment: 65 y.o. female with  h/o Afib, DOAC on hold, for heparin.  Pt was on Xarelto PTA and changed to Eliquis due to procedures.  Eliquis 5 mg last given at 2317 7/31.   Hit antibody negative (0.087). 8/17 Discussed with primary team given significant decline in platelet count will go ahead and change over to bival and send out a SRA which will take several days to result.   APTT came back therapeutic at 68, on bival@0 .03 mg/kg/hr. Plt count improving. Hgb low but stable at 8.3. No bleeding issues noted. SRA in process from 8/17.   Goal of Therapy:  Aptt goal 50-85s Monitor platelets by anticoagulation protocol: Yes   Plan:  Continue bival 0.03 mg/kg/hr Daily aPTT and CBC Follow up Clarence PharmD., BCPS Clinical Pharmacist 12/11/2020 7:33 AM

## 2020-12-11 NOTE — Plan of Care (Signed)
  Problem: Education: Goal: Ability to demonstrate management of disease process will improve Outcome: Progressing Goal: Ability to verbalize understanding of medication therapies will improve Outcome: Progressing   Problem: Activity: Goal: Capacity to carry out activities will improve Outcome: Progressing   Problem: Cardiac: Goal: Ability to achieve and maintain adequate cardiopulmonary perfusion will improve Outcome: Progressing   Problem: Health Behavior/Discharge Planning: Goal: Ability to manage health-related needs will improve Outcome: Progressing   Problem: Clinical Measurements: Goal: Ability to maintain clinical measurements within normal limits will improve Outcome: Progressing Goal: Will remain free from infection Outcome: Progressing Goal: Diagnostic test results will improve Outcome: Progressing Goal: Respiratory complications will improve Outcome: Progressing Goal: Cardiovascular complication will be avoided Outcome: Progressing   Problem: Activity: Goal: Risk for activity intolerance will decrease Outcome: Progressing   Problem: Nutrition: Goal: Adequate nutrition will be maintained Outcome: Progressing

## 2020-12-11 NOTE — Progress Notes (Addendum)
[facility] NEPHROLOGY PROGRESS NOTE  Assessment/ Plan:  # Acute kidney Injury on chronic kidney disease stage IV: Secondary to cardiorenal syndrome initially required CRRT after inadequate response to diuretics.  Now she is on intermittent hemodialysis. Status post dialysis yesterday with 2.5 L UF, tolerated well.  She still looks volume up with CVP around 20.  Discussed with the cardiologist, we will plan for another dialysis today mainly for volume management.  Discussed with the HD nurse as well.  I will consult IR to convert HD catheter to [facility]. May try another HD tomorrow depending on the staffing, CVP etc. She is aware that if she fails dialysis off of inotropic agent, she will be transitioning to hospice care.  Still on low-dose dobutamine. No sign of renal recovery.   # Acute exacerbation of congestive heart failure: She remains on dobutamine for inotropic support/cardiogenic shock and is currently off of pressors.  On midodrine 15 mg 3 times daily.  Discussed with cardiology.  # Atrial fibrillation with rapid ventricular response: Managed with amiodarone.  Monitor heart rate.  On anticoagulation.  # Hyponatremia: Secondary to acute exacerbation of congestive heart failure/worsening renal function and impaired free water handling.  Reminded about fluid restriction.  Ultrafiltration with HD.  # Anemia of chronic disease/chronic kidney disease: Iron stores are repleted and she has received ESA.  Monitor hemoglobin.  Subjective: Seen and examined.she tolerated dialysis well.  Sitting on chair comfortable.  No new event.  No urine output.  Objective Vital signs in last 24 hours: Vitals:   12/11/20 0500 12/11/20 0547 12/11/20 0700 12/11/20 0721  BP: 98/66 94/70 95/68    Pulse: 99 (!) 102 (!) 107   Resp: (!) 27 16 17    Temp:    97.8 F (36.6 C)  TempSrc:    Oral  SpO2: 96% 100% 98%   Weight:      Height:       Weight change: 0 kg  Intake/Output Summary (Last 24  hours) at 12/11/2020 0850 Last data filed at 12/11/2020 0700 Gross per 24 hour  Intake 1423.36 ml  Output 2500 ml  Net -1076.64 ml        Labs: Basic Metabolic Panel: Recent Labs  Lab 12/09/20 0425 12/10/20 0355 12/11/20 0536  NA 134* 133* 133*  K 3.6 3.7 3.7  CL 95* 92* 93*  CO2 26 23 26   GLUCOSE 108* 137* 90  BUN 24* 37* 24*  CREATININE 4.44* 6.45* 5.01*  CALCIUM 9.2 10.0 9.5  PHOS 3.7 6.2* 4.6    Liver Function Tests: Recent Labs  Lab 12/09/20 0425 12/10/20 0355 12/11/20 0536  ALBUMIN 2.7* 2.8* 2.7*    No results for input(s): LIPASE, AMYLASE in the last 168 hours. No results for input(s): AMMONIA in the last 168 hours. CBC: Recent Labs  Lab 12/06/20 0424 12/07/20 0706 12/08/20 0543 12/09/20 0425 12/10/20 0355  WBC 9.6 7.7 5.3 5.4 5.8  HGB 9.0* 9.2* 8.4* 8.2* 8.3*  HCT 28.5* 28.7* 27.3* 25.7* 26.3*  MCV 102.5* 101.4* 104.6* 102.8* 105.6*  PLT 104* 62* 73* 88* 135*    Cardiac Enzymes: No results for input(s): CKTOTAL, CKMB, CKMBINDEX, TROPONINI in the last 168 hours. CBG: Recent Labs  Lab 12/09/20 1115 12/09/20 2147 12/10/20 0633 12/10/20 1533 12/11/20 0625  GLUCAP 164* 113* 98 122* 86     Iron Studies: No results for input(s): IRON, TIBC, TRANSFERRIN, FERRITIN in the last 72 hours. Studies/Results: No results found.  Medications: Infusions:  sodium chloride  sodium chloride     sodium chloride     amiodarone 30 mg/hr (12/11/20 0700)   bivalirudin (ANGIOMAX) infusion 0.5 mg/mL (Non-ACS indications) 0.03 mg/kg/hr (12/11/20 0700)   DOBUTamine 3 mcg/kg/min (12/11/20 0706)   norepinephrine (LEVOPHED) Adult infusion Stopped (12/05/20 0519)    Scheduled Medications:  (feeding supplement) PROSource Plus  30 mL Oral BID BM   calcitRIOL  0.25 mcg Oral Daily   Chlorhexidine Gluconate Cloth  6 each Topical Daily   Chlorhexidine Gluconate Cloth  6 each Topical Q0600   clotrimazole  1 application Topical BID   estradiol  1 mg Oral Daily    febuxostat  40 mg Oral q AM   ferrous sulfate  325 mg Oral q morning   fluticasone  2 spray Each Nare Daily   fluticasone furoate-vilanterol  1 puff Inhalation Daily   gabapentin  100 mg Oral TID   insulin aspart  0-6 Units Subcutaneous TID WC   levothyroxine  88 mcg Oral q morning   lidocaine  1 patch Transdermal Q24H   midodrine  15 mg Oral TID WC   multivitamin  1 tablet Oral QHS   predniSONE  5 mg Oral Q breakfast   senna-docusate  2 tablet Oral BID   sodium chloride flush  10-40 mL Intracatheter Q12H   sodium chloride flush  3 mL Intravenous Q12H    have reviewed scheduled and prn medications.  Physical Exam: General: Sitting on chair, not in distress Heart: Irregular heart rate, s1s2 nl Lungs: Clear anteriorly, no wheezing Abdomen:soft, Non-tender, non-distended Extremities: Trace LE edema present Dialysis Access: Right IJ temporary HD catheter in place  Caitlyn Fuentes Prasad Rashi Giuliani 12/11/2020,8:50 AM  LOS: 21 days

## 2020-12-12 ENCOUNTER — Encounter (HOSPITAL_COMMUNITY): Payer: Self-pay | Admitting: Internal Medicine

## 2020-12-12 DIAGNOSIS — R570 Cardiogenic shock: Secondary | ICD-10-CM | POA: Diagnosis not present

## 2020-12-12 DIAGNOSIS — I5023 Acute on chronic systolic (congestive) heart failure: Secondary | ICD-10-CM

## 2020-12-12 DIAGNOSIS — N184 Chronic kidney disease, stage 4 (severe): Secondary | ICD-10-CM | POA: Diagnosis not present

## 2020-12-12 DIAGNOSIS — N170 Acute kidney failure with tubular necrosis: Secondary | ICD-10-CM | POA: Diagnosis not present

## 2020-12-12 LAB — RENAL FUNCTION PANEL
Albumin: 3 g/dL — ABNORMAL LOW (ref 3.5–5.0)
Anion gap: 14 (ref 5–15)
BUN: 21 mg/dL (ref 8–23)
CO2: 25 mmol/L (ref 22–32)
Calcium: 9.6 mg/dL (ref 8.9–10.3)
Chloride: 94 mmol/L — ABNORMAL LOW (ref 98–111)
Creatinine, Ser: 4.32 mg/dL — ABNORMAL HIGH (ref 0.44–1.00)
GFR, Estimated: 11 mL/min — ABNORMAL LOW (ref >60)
Glucose, Bld: 100 mg/dL — ABNORMAL HIGH (ref 70–99)
Phosphorus: 3.8 mg/dL (ref 2.5–4.6)
Potassium: 4.5 mmol/L (ref 3.5–5.1)
Sodium: 133 mmol/L — ABNORMAL LOW (ref 135–145)

## 2020-12-12 LAB — COOXEMETRY PANEL
Carboxyhemoglobin: 1.3 % (ref 0.5–1.5)
Carboxyhemoglobin: 1.6 % — ABNORMAL HIGH (ref 0.5–1.5)
Carboxyhemoglobin: 2.1 % — ABNORMAL HIGH (ref 0.5–1.5)
Methemoglobin: 0.7 % (ref 0.0–1.5)
Methemoglobin: 0.7 % (ref 0.0–1.5)
Methemoglobin: 0.9 % (ref 0.0–1.5)
O2 Saturation: 37.7 %
O2 Saturation: 50.3 %
O2 Saturation: 54.3 %
Total hemoglobin: 9.7 g/dL — ABNORMAL LOW (ref 12.0–16.0)
Total hemoglobin: 9.6 g/dL — ABNORMAL LOW (ref 12.0–16.0)
Total hemoglobin: 8.4 g/dL — ABNORMAL LOW (ref 12.0–16.0)

## 2020-12-12 LAB — URIC ACID: Uric Acid, Serum: 2.3 mg/dL — ABNORMAL LOW (ref 2.5–7.1)

## 2020-12-12 LAB — SEROTONIN RELEASE ASSAY (SRA)
SRA .2 IU/mL UFH Ser-aCnc: <1 % (ref 0–20)
SRA 100IU/mL UFH Ser-aCnc: <1 % (ref 0–20)

## 2020-12-12 LAB — GLUCOSE, CAPILLARY
Glucose-Capillary: 84 mg/dL (ref 70–99)
Glucose-Capillary: 127 mg/dL — ABNORMAL HIGH (ref 70–99)
Glucose-Capillary: 123 mg/dL — ABNORMAL HIGH (ref 70–99)
Glucose-Capillary: 132 mg/dL — ABNORMAL HIGH (ref 70–99)

## 2020-12-12 LAB — CBC
HCT: 31.3 % — ABNORMAL LOW (ref 36.0–46.0)
Hemoglobin: 9.7 g/dL — ABNORMAL LOW (ref 12.0–15.0)
MCH: 33.4 pg (ref 26.0–34.0)
MCHC: 31 g/dL (ref 30.0–36.0)
MCV: 107.9 fL — ABNORMAL HIGH (ref 80.0–100.0)
Platelets: 259 10*3/uL (ref 150–400)
RBC: 2.9 MIL/uL — ABNORMAL LOW (ref 3.87–5.11)
RDW: 16.1 % — ABNORMAL HIGH (ref 11.5–15.5)
WBC: 7.1 10*3/uL (ref 4.0–10.5)
nRBC: 1.5 % — ABNORMAL HIGH (ref 0.0–0.2)

## 2020-12-12 LAB — APTT: aPTT: 58 seconds — ABNORMAL HIGH (ref 24–36)

## 2020-12-12 LAB — MAGNESIUM: Magnesium: 1.9 mg/dL (ref 1.7–2.4)

## 2020-12-12 MED ORDER — COLCHICINE 0.6 MG PO TABS
0.6000 mg | ORAL_TABLET | Freq: Once | ORAL | Status: AC
  Administered 2020-12-12: 0.6 mg via ORAL
  Filled 2020-12-12: qty 1

## 2020-12-12 MED ORDER — COLCHICINE 0.3 MG HALF TABLET
0.3000 mg | ORAL_TABLET | Freq: Every day | ORAL | Status: DC
  Administered 2020-12-13 – 2020-12-16 (×4): 0.3 mg via ORAL
  Filled 2020-12-12 (×5): qty 1

## 2020-12-12 NOTE — Progress Notes (Signed)
RT filled water chamber on pt's home cpap.  It remains at bedside for pt to place herself on/off as needed.  RT will continue to monitor.

## 2020-12-12 NOTE — Progress Notes (Signed)
[facility] NEPHROLOGY PROGRESS NOTE  Assessment/ Plan:  # Acute kidney Injury on chronic kidney disease stage IV: Sees Dr. Justin Mend at [facility]  Secondary to cardiorenal syndrome initially required CRRT after inadequate response to diuretics.  Now she is on intermittent hemodialysis. D/w AHF, remains hypervolemic and decompensated Try for daily HD for serial UF, try to get CVP down, off inotropes HD today: try for 4L, 2K, 350/600, Temp HD Cath No sign of renal recovery.   # Acute exacerbation of congestive heart failure: Remains decompensated. on dobutamine for inotropic support/cardiogenic shock; high CVPs, low Co-O2.  On midodrine 15 mg 3 times daily.  Discussed with cardiology.  # Atrial fibrillation with rapid ventricular response: Managed with amiodarone.  Monitor heart rate.  On anticoagulation.  # Hyponatremia: Secondary to acute exacerbation of congestive heart failure/worsening renal function and impaired free water handling.  Stable at 133  # Anemia of chronic disease/chronic kidney disease: Iron stores are repleted and she has received ESA.  Monitor hemoglobin.  Subjective:  No c/o this AM Had HD yesterday, 3LUF CVP remains elevated, 21 this AM, Remains on DB inotrope CaOx remains low  Objective Vital signs in last 24 hours: Vitals:   12/12/20 0500 12/12/20 0530 12/12/20 0710 12/12/20 0800  BP: 90/69 101/69 117/79   Pulse: (!) 108 (!) 111 (!) 111 (!) 125  Resp:      Temp:  98.4 F (36.9 C)    TempSrc:  Oral    SpO2: 95% 100% 100% 100%  Weight:      Height:       Weight change: -3 kg  Intake/Output Summary (Last 24 hours) at 12/12/2020 1006 Last data filed at 12/12/2020 0800 Gross per 24 hour  Intake 650.54 ml  Output 3000 ml  Net -2349.46 ml        Labs: Basic Metabolic Panel: Recent Labs  Lab 12/10/20 0355 12/11/20 0536 12/12/20 0522  NA 133* 133* 133*  K 3.7 3.7 4.5  CL 92* 93* 94*  CO2 23 26 25   GLUCOSE 137* 90 100*  BUN 37* 24* 21   CREATININE 6.45* 5.01* 4.32*  CALCIUM 10.0 9.5 9.6  PHOS 6.2* 4.6 3.8    Liver Function Tests: Recent Labs  Lab 12/10/20 0355 12/11/20 0536 12/12/20 0522  ALBUMIN 2.8* 2.7* 3.0*    No results for input(s): LIPASE, AMYLASE in the last 168 hours. No results for input(s): AMMONIA in the last 168 hours. CBC: Recent Labs  Lab 12/07/20 0706 12/08/20 0543 12/09/20 0425 12/10/20 0355 12/12/20 0522  WBC 7.7 5.3 5.4 5.8 7.1  HGB 9.2* 8.4* 8.2* 8.3* 9.7*  HCT 28.7* 27.3* 25.7* 26.3* 31.3*  MCV 101.4* 104.6* 102.8* 105.6* 107.9*  PLT 62* 73* 88* 135* 259    Cardiac Enzymes: No results for input(s): CKTOTAL, CKMB, CKMBINDEX, TROPONINI in the last 168 hours. CBG: Recent Labs  Lab 12/11/20 0625 12/11/20 1131 12/11/20 1538 12/11/20 2147 12/12/20 0630  GLUCAP 86 115* 137* 102* 84     Iron Studies: No results for input(s): IRON, TIBC, TRANSFERRIN, FERRITIN in the last 72 hours. Studies/Results: No results found.  Medications: Infusions:  sodium chloride     sodium chloride     sodium chloride     amiodarone 30 mg/hr (12/12/20 0800)   bivalirudin (ANGIOMAX) infusion 0.5 mg/mL (Non-ACS indications) 0.03 mg/kg/hr (12/12/20 0800)   DOBUTamine 3 mcg/kg/min (12/12/20 0800)   norepinephrine (LEVOPHED) Adult infusion Stopped (12/05/20 0519)    Scheduled Medications:  (feeding supplement) PROSource Plus  30 mL Oral BID BM   calcitRIOL  0.25 mcg Oral Daily   Chlorhexidine Gluconate Cloth  6 each Topical Daily   Chlorhexidine Gluconate Cloth  6 each Topical Q0600   clotrimazole  1 application Topical BID   estradiol  1 mg Oral Daily   febuxostat  40 mg Oral q AM   ferrous sulfate  325 mg Oral q morning   fluticasone  2 spray Each Nare Daily   fluticasone furoate-vilanterol  1 puff Inhalation Daily   gabapentin  100 mg Oral TID   insulin aspart  0-6 Units Subcutaneous TID WC   levothyroxine  88 mcg Oral q morning   lidocaine  1 patch Transdermal Q24H   midodrine  15 mg  Oral TID WC   multivitamin  1 tablet Oral QHS   predniSONE  5 mg Oral Q breakfast   senna-docusate  2 tablet Oral BID   sodium chloride flush  10-40 mL Intracatheter Q12H   sodium chloride flush  3 mL Intravenous Q12H    have reviewed scheduled and prn medications.  Physical Exam: General: Sitting on chair, not in distress Heart: Irregular heart rate, s1s2 nl Lungs: Clear anteriorly, no wheezing Abdomen:soft, Non-tender, non-distended Extremities: 3+ LE edema present Dialysis Access: Right IJ temporary HD catheter in place  Aquan Kope B Nishita Isaacks 12/12/2020,10:06 AM  LOS: 22 days

## 2020-12-12 NOTE — Progress Notes (Signed)
Physical Therapy Treatment Patient Details Name: Caitlyn Fuentes MRN: [id number] DOB: 07-31-1955 Today's Date: 12/12/2020    History of Present Illness Pt is a 65 y/o female presenting on 11/20/20 with worsening palpitations, SOB and LE edema. Pt found in rapid a fib, chest x-ray with significant vascular congestion. 8/1 hypotensive with cardiogenic shock, transferred to ICU and urgent cardioversion.   S/P TEE and cardioversion 8/4. CRRT 8/8-8/13. DCCV with NSR 8/19.PMHx: afib, cardiomyopathy, HTN, lupus, PAF, CKD.    PT Comments    Pt with decreased activity tolerance today with fatigue, decreased ability to maintain eyes open with noted asterixis of bil UE and trunk limiting pt control and stability in sitting and standing today. Pt with rapid fatigue today only able to tolerate 15' of gait and limited HEP. Will continue to progress function.  BP 100/57 (71) HR 113-122 99% on RA    Follow Up Recommendations  CIR;Supervision/Assistance - 24 hour     Equipment Recommendations  Rolling walker with 5" wheels    Recommendations for Other Services       Precautions / Restrictions Precautions Precautions: Fall Precaution Comments: watch HR, asterixis bil UE and trunk    Mobility  Bed Mobility               General bed mobility comments: pt in recliner on arrival and end of session    Transfers Overall transfer level: Needs assistance   Transfers: Sit to/from Stand Sit to Stand: Min guard         General transfer comment: minguard to rise from recliner and straight back chair. Pt with asterixis of bil UE and trunk with need for guarding for safety  Ambulation/Gait Ambulation/Gait assistance: Min guard Gait Distance (Feet): 15 Feet Assistive device: Rolling walker (2 wheeled) Gait Pattern/deviations: Step-through pattern;Decreased stride length   Gait velocity interpretation: <1.8 ft/sec, indicate of risk for recurrent falls General Gait Details: 67' with RW x  2 trials with seated rest between and limited by fatigue and jerking with HR 116-122 with activity. Pt with decreased activity tolerance and unable to ambulate further this session   Stairs             Wheelchair Mobility    Modified Rankin (Stroke Patients Only)       Balance Overall balance assessment: Needs assistance   Sitting balance-Leahy Scale: Fair Sitting balance - Comments: able to sit chair without armrests   Standing balance support: Bilateral upper extremity supported   Standing balance comment: Rw for gait                            Cognition Arousal/Alertness: Awake/alert Behavior During Therapy: WFL for tasks assessed/performed Overall Cognitive Status: Within Functional Limits for tasks assessed                                 General Comments: pt with drowsiness and difficulty maintaining eyes open but responding appropriately      Exercises General Exercises - Lower Extremity Long Arc Quad: AROM;Both;Seated;20 reps Hip Flexion/Marching: AROM;Both;Seated;20 reps    General Comments        Pertinent Vitals/Pain Pain Assessment: No/denies pain    Home Living                      Prior Function            PT  Goals (current goals can now be found in the care plan section) Acute Rehab PT Goals Patient Stated Goal: to be able to go home PT Goal Formulation: With patient Time For Goal Achievement: 12/26/20 Potential to Achieve Goals: Fair Progress towards PT goals: Progressing toward goals;Goals downgraded-see care plan    Frequency    Min 3X/week      PT Plan Current plan remains appropriate    Co-evaluation              AM-PAC PT "6 Clicks" Mobility   Outcome Measure  Help needed turning from your back to your side while in a flat bed without using bedrails?: A Little Help needed moving from lying on your back to sitting on the side of a flat bed without using bedrails?: A Little Help  needed moving to and from a bed to a chair (including a wheelchair)?: A Little Help needed standing up from a chair using your arms (e.g., wheelchair or bedside chair)?: A Little Help needed to walk in hospital room?: A Little Help needed climbing 3-5 steps with a railing? : A Lot 6 Click Score: 17    End of Session Equipment Utilized During Treatment: Gait belt Activity Tolerance: Patient limited by fatigue Patient left: in chair;with call bell/phone within reach;with nursing/sitter in room Nurse Communication: Mobility status PT Visit Diagnosis: Other abnormalities of gait and mobility (R26.89);Muscle weakness (generalized) (M62.81);Difficulty in walking, not elsewhere classified (R26.2)     Time: 8264-1583 PT Time Calculation (min) (ACUTE ONLY): 27 min  Charges:  $Therapeutic Exercise: 8-22 mins $Therapeutic Activity: 8-22 mins                     Johm Pfannenstiel P, PT Acute Rehabilitation Services Pager: [phone] Office: [place] 12/12/2020, 12:40 PM

## 2020-12-12 NOTE — Progress Notes (Signed)
PT Cancellation Note  Patient Details Name: Caitlyn Fuentes MRN: [id number] DOB: 04-Jun-1955   Cancelled Treatment:    Reason Eval/Treat Not Completed: Patient not medically ready (Pt currently with HR 126 at rest sinus tach with Bp 191/163 with asterixis of bil UE at rest.)   [name] 12/12/2020, 10:33 AM Bayard Males, PT Acute Rehabilitation Services Pager: [phone] Office: [phone]

## 2020-12-12 NOTE — Progress Notes (Signed)
  Inpatient Rehabilitation Admissions Coordinator   I continue to follow her progress to assist when appropriate.  Danne Baxter, RN, MSN Rehab Admissions Coordinator [phone] 12/12/2020 4:59 PM

## 2020-12-12 NOTE — Progress Notes (Addendum)
ANTICOAGULATION CONSULT NOTE - Follow-Up Consult  Pharmacy Consult for bivalirudin Indication: atrial fibrillation  Patient Measurements: Height: 5' 3.5" (161.3 cm) Weight: 122.4 kg (269 lb 13.5 oz) IBW/kg (Calculated) : 53.55 Heparin Dosing Weight: 85 kg  Vital Signs: Temp: 98.4 F (36.9 C) (08/22 0530) Temp Source: Oral (08/22 0530) BP: 117/79 (08/22 0710) Pulse Rate: 111 (08/22 0710)  Labs: Recent Labs    12/10/20 0355 12/11/20 0536 12/12/20 0522  HGB 8.3*  --  9.7*  HCT 26.3*  --  31.3*  PLT 135*  --  259  APTT 81* 68* 58*  CREATININE 6.45* 5.01* 4.32*     Estimated Creatinine Clearance: 16.8 mL/min (A) (by C-G formula based on SCr of 4.32 mg/dL (H)).   Medical History: Past Medical History:  Diagnosis Date   A-fib ([place])    Cardiomyopathy    Hyperlipidemia    Hypertension    Lupus ([place])    Pulmonary hypertension ([place]) 05/10/2011   Echo, EF-40-45   Renal disorder    Sleep apnea 2008   CPAP, pt does not know settings   Thyroid disease     Medications:     Assessment: 65 y.o. female with  h/o Afib, DOAC on hold, for heparin.  Pt was on Xarelto PTA and changed to Eliquis due to procedures.  Eliquis 5 mg last given at 2317 7/31.   Hit antibody negative (0.087). 8/17 Discussed with primary team given significant decline in platelet count will go ahead and change over to bival and send out a SRA which will take several days to result.   APTT came back therapeutic at 58, on bival@0 .03 mg/kg/hr. Plt count improving, now at 259. Hgb stable at 9.7. No bleeding issues noted. SRA in process from 8/17.   Goal of Therapy:  Aptt goal 50-85s Monitor platelets by anticoagulation protocol: Yes   Plan:  Continue bival 0.03 mg/kg/hr Daily aPTT and CBC Follow up SRA- came back negative 8/22  Antonietta Jewel, PharmD, [place] Pharmacist  Phone: [phone] 12/12/2020 7:29 AM  Please check AMION for all [place] phone numbers After 10:00 PM, call [place] [phone]

## 2020-12-12 NOTE — Progress Notes (Signed)
Patient ID: Caitlyn Fuentes, female   DOB: 12-18-1955, 65 y.o.   MRN: [id number]     Advanced Heart Failure Rounding Note  PCP-Cardiologist: Shelva Majestic, MD  HF clinic: Dr. Haroldine Laws  Subjective:    Events: - 8/1 A fib RVR & A/C systolic heart faiure --> cardiogenic shock. Had urgent DC-CV with brief conversion to NSR but went back in A fib. . On Norepi + milrinone. AKI. Nephrology consulted.  - 8/2 A fib RVR, amio gtt. Continued on milrinone 0.375 mcg + norepi 2 mcg. Started on prednisone 40 mg daily x3 days for gout flare.  - 8/3 did not tolerate milrinone wean w/ drop in Co-ox 68%--->48% and decrease in UOP. Milrinone increased back to 0.375.  Lasix gtt increased to 30/hr.  - 8/4 NE added back given persistently low Co-ox - 8/4 s/p TEE/DCCV>>NSR  - 8/5 back in Afib w/ RVR  -  08/09 HD cath placed, CRRT started - 8/10: conversion to sinus tachycardia  - 8/13 CRRT stopped - 8/15 recurrent AF - 8/16 tolerated iHD - 8/18 tolerated iHD  - 8/19 DCCV to NSR  HD yesterday with 3L of volume removal.  Today, co-ox 50.3% on dobutamine 3 with CVP of 25-26.  ST in 120s on telemetry.  She is sitting up in bed and feels sleepy and nauseous.     Objective:   Weight Range: 122.4 kg Body mass index is 47.05 kg/m.   Vital Signs:   Temp:  [97.7 F (36.5 C)-98.9 F (37.2 C)] 98.4 F (36.9 C) (08/22 0530) Pulse Rate:  [95-116] 111 (08/22 0710) Resp:  [13-33] 30 (08/22 0300) BP: (81-117)/(52-83) 117/79 (08/22 0710) SpO2:  [91 %-100 %] 100 % (08/22 0710) Weight:  [120.6 kg-122.4 kg] 122.4 kg (08/22 0200) Last BM Date: 12/11/20  Weight change: Filed Weights   12/11/20 1620 12/11/20 1920 12/12/20 0200  Weight: 122.4 kg 120.6 kg 122.4 kg    Intake/Output:   Intake/Output Summary (Last 24 hours) at 12/12/2020 0733 Last data filed at 12/12/2020 0600 Gross per 24 hour  Intake 679.94 ml  Output 3000 ml  Net -2320.06 ml      Physical Exam   CVP 25 General: NAD Neck: JVP 16+,  no thyromegaly or thyroid nodule.  Lungs: Clear to auscultation bilaterally with normal respiratory effort. CV: Nondisplaced PMI.  Heart regular S1/S2, no S3/S4, no murmur.  2+ ankle edema.  Abdomen: Soft, nontender, no hepatosplenomegaly, no distention.  Skin: Intact without lesions or rashes.  Neurologic: Alert and oriented x 3.  Psych: Normal affect. Extremities: No clubbing or cyanosis.  HEENT: RIJ HD catheter, LIJ CVC     Telemetry   ST 120s. Personally reviewed  Labs    CBC Recent Labs    12/10/20 0355 12/12/20 0522  WBC 5.8 7.1  HGB 8.3* 9.7*  HCT 26.3* 31.3*  MCV 105.6* 107.9*  PLT 135* 956    Basic Metabolic Panel Recent Labs    12/11/20 0536 12/12/20 0522  NA 133* 133*  K 3.7 4.5  CL 93* 94*  CO2 26 25  GLUCOSE 90 100*  BUN 24* 21  CREATININE 5.01* 4.32*  CALCIUM 9.5 9.6  MG 2.0 1.9  PHOS 4.6 3.8    Liver Function Tests Recent Labs    12/11/20 0536 12/12/20 0522  ALBUMIN 2.7* 3.0*     No results for input(s): LIPASE, AMYLASE in the last 72 hours. Cardiac Enzymes No results for input(s): CKTOTAL, CKMB, CKMBINDEX, TROPONINI in the last 72 hours.  BNP:  BNP (last 3 results) Recent Labs    11/20/20 0318 11/21/20 0258  BNP 2,051.3* 2,022.4*     ProBNP (last 3 results) No results for input(s): PROBNP in the last 8760 hours.   D-Dimer No results for input(s): DDIMER in the last 72 hours. Hemoglobin A1C No results for input(s): HGBA1C in the last 72 hours.  Fasting Lipid Panel No results for input(s): CHOL, HDL, LDLCALC, TRIG, CHOLHDL, LDLDIRECT in the last 72 hours. Thyroid Function Tests No results for input(s): TSH, T4TOTAL, T3FREE, THYROIDAB in the last 72 hours.  Invalid input(s): FREET3   Other results:   Imaging    No results found.   Medications:     Scheduled Medications:  (feeding supplement) PROSource Plus  30 mL Oral BID BM   calcitRIOL  0.25 mcg Oral Daily   Chlorhexidine Gluconate Cloth  6 each  Topical Daily   Chlorhexidine Gluconate Cloth  6 each Topical Q0600   clotrimazole  1 application Topical BID   estradiol  1 mg Oral Daily   febuxostat  40 mg Oral q AM   ferrous sulfate  325 mg Oral q morning   fluticasone  2 spray Each Nare Daily   fluticasone furoate-vilanterol  1 puff Inhalation Daily   gabapentin  100 mg Oral TID   insulin aspart  0-6 Units Subcutaneous TID WC   levothyroxine  88 mcg Oral q morning   lidocaine  1 patch Transdermal Q24H   midodrine  15 mg Oral TID WC   multivitamin  1 tablet Oral QHS   predniSONE  5 mg Oral Q breakfast   senna-docusate  2 tablet Oral BID   sodium chloride flush  10-40 mL Intracatheter Q12H   sodium chloride flush  3 mL Intravenous Q12H    Infusions:  sodium chloride     sodium chloride     sodium chloride     amiodarone 30 mg/hr (12/12/20 0600)   bivalirudin (ANGIOMAX) infusion 0.5 mg/mL (Non-ACS indications) 0.03 mg/kg/hr (12/12/20 0600)   DOBUTamine 3 mcg/kg/min (12/12/20 0600)   norepinephrine (LEVOPHED) Adult infusion Stopped (12/05/20 0519)    PRN Medications: sodium chloride, sodium chloride, sodium chloride, acetaminophen, albuterol, alteplase, calcium carbonate, cyclobenzaprine, HYDROcodone-acetaminophen, hydrocortisone cream, ondansetron (ZOFRAN) IV, oxyCODONE, sodium chloride flush, sodium chloride flush    Patient Profile   Ms Petros is a 65 year old with a history of chronic systolic hf previously EF 45%,  NICM, hypothyroid, PAF, gout, OSA, CKD Stage IV, HTN, hyperlipidemia, and lupus.   Admitted with A fib RVR and A/C systolic heart failure complicated by cardiogenic shock.     Assessment/Plan   1. Acute/Chronic Systolic Heart Failure -> Cardiogenic shock in setting AF with RVR - 8/1 Lactic acid 1.5. CO-OX 43%.--> started on milrinone and NE - Echo 7/22 EF 30-35% (2021)  This admit -> 25-30%. - NE stopped 8/12, then resumed briefly the morning of 8/15 -- later weaned off.  - CVVHD stopped 8/13.  Tolerating iHD on dobutamine, but remains fluid overloaded.   - Unable to tolerate GDMT due to hypotension and AKI - Not candidate for advanced therapies due to size and ESRD. If unable to tolerate iHD off pressors/ fails inotrope wean, she would require hospice evaluation - Continue midodrine 15 mg TID  - Palliative care now following  - HD on 8/21 with 3L of volume removal, but she remains overloaded and may need another session today. Co-ox on 50.3% and weight is up four pounds (however, is a bed weight)  2. PAF--> Afib RVR - Developed shock and had urgent cardioversion 8/1 with conversion to NSR w/ ERAF - Repeat DCCV 8/4 w/ ERAF - Conversion to sinus tachycardia on 8/10 with IV amio - Afib again starting 8/17, DCCV on 8/19.  - unfortunately, not a candidate for ablation due to size if AF were to recur. Not candidate for AVN ablation and CRT. - plts steadily decreased, so she was switched to Bival. Platelet count now trending up.  - SRA pending, but plts better on bivalirudin.   - Remains in SR/ST today -- continue amiodarone 30 mg/hr while on dobutamine.    3. AKI on CKD Stage IV -> ESRD - Baseline SCr ~3.2, AKI on admission to 7.33 - Renal US with no acute findings - Off CRRT on 8/13. No response to high-dose lasix. Now on iHD but struggling to keep fluid down, even after iHD 8/20 and 8/21. May need another session today since she remains very volume overloaded.  - Continue ICU care due to tenuous hemodynamic status while on iHD    4. OSA - Uses CPAP, but refused overnight    5. Hypothyroidism -TSH 8. T3 ok and T4 2.3  -On synthroid. Dose increased to 88 mcg -Repeat TSH in 6 weeks.    6. Gout - Uric acid 10. Pain RLE that was a questionable flare.  Now resolved  - On uloric 40 mg daily  - Completed 3 day course of prednisone    7. Hypokalemia -- now resolved -Stable.   8. Deconditioning - Continue PT/OT--> CIR following.  - needs HHPT. ? SNF  - Mobilize today  9  Lupus - on low-dose prednisone.  10. Thrombocytopenia - Plt count of 121 -> 104 -- >62 switched from heparin drip to bival-->73->88 -> 135  - HIT panel negative but plts better on bivalirudin -- now 239 - SRA pending.  - No active bleeding noted  - Remains on bivalirudin for now.    Darcella Gasman, NP  7:33 AM 12/12/2020  Agree with above.   Underwent HD again yesterday with 3L off. However CVP still 21 and weight not budging On DBA 3. Co-ox down to 37 with repeat 51% today.   Feels ok. Denies SOB. + edema. Remains on heparin and amio for AF. In sinus today  General:  Sitting in chair. No resp difficulty HEENT: normal Neck: supple. + HD cath. Carotids 2+ bilat; no bruits. No lymphadenopathy or thryomegaly appreciated. Cor: PMI nondisplaced. Regular tachy. No rubs, gallops or murmurs. Lungs: clear Abdomen: obese soft, nontender, nondistended. No hepatosplenomegaly. No bruits or masses. Good bowel sounds. Extremities: no cyanosis, clubbing, rash, 3+ edema Neuro: alert & orientedx3, cranial nerves grossly intact. moves all 4 extremities w/o difficulty. Affect pleasant  Although she is tolerating HD with DBA support, co-ox remains low and CVP high. Will attempt to limit fluid intake and also concentrate gtts.  Increase DBA to 5.   I d/w Renal. I think we need to dialyze her as hard as pssible this week to see if iHD can be successful in maintaining her volume status in setting of severe cardiomyopathy. Need to try to get CVP < 12. If we cannot do this then we will need to consider Hospice Care. Ideally would dialyze daily (if staffing permits) so we can determine this week.   CRITICAL CARE Performed by: Glori Bickers  Total critical care time: 35 minutes  Critical care time was exclusive of separately billable procedures and treating other patients.  Critical care was necessary to treat or prevent imminent or life-threatening deterioration.  Critical care was time spent  personally by me (independent of midlevel providers or residents) on the following activities: development of treatment plan with patient and/or surrogate as well as nursing, discussions with consultants, evaluation of patient's response to treatment, examination of patient, obtaining history from patient or surrogate, ordering and performing treatments and interventions, ordering and review of laboratory studies, ordering and review of radiographic studies, pulse oximetry and re-evaluation of patient's condition.  Glori Bickers, MD  9:55 AM

## 2020-12-13 DIAGNOSIS — N184 Chronic kidney disease, stage 4 (severe): Secondary | ICD-10-CM | POA: Diagnosis not present

## 2020-12-13 DIAGNOSIS — I5023 Acute on chronic systolic (congestive) heart failure: Secondary | ICD-10-CM | POA: Diagnosis not present

## 2020-12-13 DIAGNOSIS — N170 Acute kidney failure with tubular necrosis: Secondary | ICD-10-CM | POA: Diagnosis not present

## 2020-12-13 LAB — COOXEMETRY PANEL
Carboxyhemoglobin: 1.7 % — ABNORMAL HIGH (ref 0.5–1.5)
Carboxyhemoglobin: 1.5 % (ref 0.5–1.5)
Methemoglobin: 0.8 % (ref 0.0–1.5)
Methemoglobin: 0.7 % (ref 0.0–1.5)
O2 Saturation: 35.4 %
O2 Saturation: 46.2 %
Total hemoglobin: 9 g/dL — ABNORMAL LOW (ref 12.0–16.0)
Total hemoglobin: 9.4 g/dL — ABNORMAL LOW (ref 12.0–16.0)

## 2020-12-13 LAB — CBC
HCT: 28.7 % — ABNORMAL LOW (ref 36.0–46.0)
Hemoglobin: 8.8 g/dL — ABNORMAL LOW (ref 12.0–15.0)
MCH: 33.1 pg (ref 26.0–34.0)
MCHC: 30.7 g/dL (ref 30.0–36.0)
MCV: 107.9 fL — ABNORMAL HIGH (ref 80.0–100.0)
Platelets: 297 10*3/uL (ref 150–400)
RBC: 2.66 MIL/uL — ABNORMAL LOW (ref 3.87–5.11)
RDW: 15.6 % — ABNORMAL HIGH (ref 11.5–15.5)
WBC: 8.7 10*3/uL (ref 4.0–10.5)
nRBC: 1 % — ABNORMAL HIGH (ref 0.0–0.2)

## 2020-12-13 LAB — RENAL FUNCTION PANEL
Albumin: 2.6 g/dL — ABNORMAL LOW (ref 3.5–5.0)
Anion gap: 15 (ref 5–15)
BUN: 37 mg/dL — ABNORMAL HIGH (ref 8–23)
CO2: 22 mmol/L (ref 22–32)
Calcium: 9.2 mg/dL (ref 8.9–10.3)
Chloride: 90 mmol/L — ABNORMAL LOW (ref 98–111)
Creatinine, Ser: 6.36 mg/dL — ABNORMAL HIGH (ref 0.44–1.00)
GFR, Estimated: 7 mL/min — ABNORMAL LOW (ref >60)
Glucose, Bld: 95 mg/dL (ref 70–99)
Phosphorus: 4.7 mg/dL — ABNORMAL HIGH (ref 2.5–4.6)
Potassium: 4.8 mmol/L (ref 3.5–5.1)
Sodium: 127 mmol/L — ABNORMAL LOW (ref 135–145)

## 2020-12-13 LAB — GLUCOSE, CAPILLARY
Glucose-Capillary: 83 mg/dL (ref 70–99)
Glucose-Capillary: 130 mg/dL — ABNORMAL HIGH (ref 70–99)
Glucose-Capillary: 105 mg/dL — ABNORMAL HIGH (ref 70–99)
Glucose-Capillary: 120 mg/dL — ABNORMAL HIGH (ref 70–99)

## 2020-12-13 LAB — MAGNESIUM: Magnesium: 1.6 mg/dL — ABNORMAL LOW (ref 1.7–2.4)

## 2020-12-13 LAB — APTT: aPTT: 70 seconds — ABNORMAL HIGH (ref 24–36)

## 2020-12-13 LAB — AMMONIA: Ammonia: 20 umol/L (ref 9–35)

## 2020-12-13 MED ORDER — DARBEPOETIN ALFA 60 MCG/0.3ML IJ SOSY
60.0000 ug | PREFILLED_SYRINGE | INTRAMUSCULAR | Status: DC
  Administered 2020-12-13: 60 ug via INTRAVENOUS
  Filled 2020-12-13: qty 0.3

## 2020-12-13 MED ORDER — ALBUMIN HUMAN 25 % IV SOLN
25.0000 g | Freq: Once | INTRAVENOUS | Status: AC
  Administered 2020-12-13: 25 g via INTRAVENOUS
  Filled 2020-12-13: qty 100

## 2020-12-13 MED ORDER — ANTICOAGULANT SODIUM CITRATE 4% (200MG/5ML) IV SOLN
2.4000 mL | Status: DC | PRN
  Administered 2020-12-13: 2.4 mL
  Filled 2020-12-13: qty 2.4
  Filled 2020-12-13 (×3): qty 5

## 2020-12-13 MED ORDER — MAGNESIUM SULFATE 2 GM/50ML IV SOLN
2.0000 g | Freq: Once | INTRAVENOUS | Status: AC
  Administered 2020-12-13: 2 g via INTRAVENOUS
  Filled 2020-12-13: qty 50

## 2020-12-13 NOTE — Progress Notes (Signed)
Occupational Therapy Treatment Patient Details Name: Caitlyn Fuentes MRN: [id number] DOB: [birth year] Today's Date: 12/13/2020    History of present illness Pt is a 65 y/o female presenting on 11/20/20 with worsening palpitations, SOB and LE edema. Pt found in rapid a fib, chest x-ray with significant vascular congestion. 8/1 hypotensive with cardiogenic shock, transferred to ICU and urgent cardioversion.   S/P TEE and cardioversion 8/4. CRRT 8/8-8/13. DCCV with NSR 8/19.PMHx: afib, cardiomyopathy, HTN, lupus, PAF, CKD.   OT comments  Patient seated in recliner, engaged in OT session.  Completing grooming at sink with setup seated, applying lotion on B arms with supervision.  Reviewed energy conservation techniques during session.  Transfers with min guard using RW today.  Will follow.    Follow Up Recommendations  CIR (pending progress/tolerance)    Equipment Recommendations  Other (comment) (TBD)    Recommendations for Other Services Rehab consult;Other (comment) (palliative care)    Precautions / Restrictions Precautions Precautions: Fall Precaution Comments: watch HR, asterixis bil UE and trunk Restrictions Weight Bearing Restrictions: No       Mobility Bed Mobility               General bed mobility comments: OOB in recliner upon entry    Transfers Overall transfer level: Needs assistance Equipment used: Rolling walker (2 wheeled) Transfers: Sit to/from Stand Sit to Stand: Min guard         General transfer comment: min guard for safety    Balance Overall balance assessment: Needs assistance Sitting-balance support: Feet supported Sitting balance-Leahy Scale: Fair     Standing balance support: Bilateral upper extremity supported;During functional activity Standing balance-Leahy Scale: Fair Standing balance comment: B UE support dynaimcally                           ADL either performed or assessed with clinical judgement   ADL Overall  ADL's : Needs assistance/impaired     Grooming: Set up;Oral care;Sitting Grooming Details (indicate cue type and reason): sitting at sink for energy conservation                 Toilet Transfer: Min Psychiatric nurse Details (indicate cue type and reason): sit to stand, simulated         Functional mobility during ADLs: Min guard General ADL Comments: reviewed energy conservation techniques     Vision       Perception     Praxis      Cognition Arousal/Alertness: Awake/alert Behavior During Therapy: WFL for tasks assessed/performed Overall Cognitive Status: Within Functional Limits for tasks assessed                                          Exercises     Shoulder Instructions       General Comments VSS with HR up to 122 during ADLs    Pertinent Vitals/ Pain       Pain Assessment: Faces Faces Pain Scale: Hurts a little bit Pain Location: L shoulder blade Pain Descriptors / Indicators: Discomfort Pain Intervention(s): Monitored during session  Home Living                                          Prior Functioning/Environment  Frequency  Min 2X/week        Progress Toward Goals  OT Goals(current goals can now be found in the care plan section)  Progress towards OT goals: Progressing toward goals  Acute Rehab OT Goals Patient Stated Goal: to be able to go home OT Goal Formulation: With patient Time For Goal Achievement: 12/19/20 Potential to Achieve Goals: Fair ADL Goals Pt Will Perform Grooming: with supervision;standing Pt Will Perform Lower Body Dressing: with supervision;with adaptive equipment;sit to/from stand Pt Will Transfer to Toilet: with supervision;ambulating Pt Will Perform Toileting - Clothing Manipulation and hygiene: with supervision;sit to/from stand;sitting/lateral leans Pt/caregiver will Perform Home Exercise Program: Both right and left upper extremity;With written HEP  provided;Increased strength Additional ADL Goal #1: Pt will verbalize/demonstrate 2 energy conservation techniques to [employer] during daily routine.  Plan Discharge plan remains appropriate;Frequency remains appropriate    Co-evaluation                 AM-PAC OT "6 Clicks" Daily Activity     Outcome Measure   Help from another person eating meals?: None Help from another person taking care of personal grooming?: A Little Help from another person toileting, which includes using toliet, bedpan, or urinal?: A Little Help from another person bathing (including washing, rinsing, drying)?: A Little Help from another person to put on and taking off regular upper body clothing?: A Little Help from another person to put on and taking off regular lower body clothing?: A Lot 6 Click Score: 18    End of Session Equipment Utilized During Treatment: Rolling walker  OT Visit Diagnosis: Other abnormalities of gait and mobility (R26.89);Muscle weakness (generalized) (M62.81)   Activity Tolerance Patient tolerated treatment well   Patient Left in chair;with call bell/phone within reach   Nurse Communication Mobility status        Time: 8592-9244 OT Time Calculation (min): 35 min  Charges: OT General Charges $OT Visit: 1 Visit OT Treatments $Self Care/Home Management : 23-37 mins  Jolaine Artist, OT Acute Rehabilitation Services Pager [phone] Office [place] 12/13/2020, 9:48 AM

## 2020-12-13 NOTE — Progress Notes (Signed)
Pt has home unit cpap at bedside and places self on/off when ready.  RT will continue to monitor

## 2020-12-13 NOTE — Progress Notes (Addendum)
Patient ID: Caitlyn Fuentes, female   DOB: 13-Jun-1955, 65 y.o.   MRN: [id number]     Advanced Heart Failure Rounding Note  PCP-Cardiologist: Shelva Majestic, MD  HF clinic: Dr. Haroldine Laws  Subjective:    Events: - 8/1 A fib RVR & A/C systolic heart faiure --> cardiogenic shock. Had urgent DC-CV with brief conversion to NSR but went back in A fib. . On Norepi + milrinone. AKI. Nephrology consulted.  - 8/2 A fib RVR, amio gtt. Continued on milrinone 0.375 mcg + norepi 2 mcg. Started on prednisone 40 mg daily x3 days for gout flare.  - 8/3 did not tolerate milrinone wean w/ drop in Co-ox 68%--->48% and decrease in UOP. Milrinone increased back to 0.375.  Lasix gtt increased to 30/hr.  - 8/4 NE added back given persistently low Co-ox - 8/4 s/p TEE/DCCV>>NSR  - 8/5 back in Afib w/ RVR  -  08/09 HD cath placed, CRRT started - 8/10: conversion to sinus tachycardia  - 8/13 CRRT stopped - 8/15 recurrent AF - 8/16 tolerated iHD - 8/18 tolerated iHD  - 8/19 DCCV to NSR - 8/22 DBA increased to 5   Co-ox remains low despite DBA increase, 35% this morning (54% yesterday evening). STAT Repeat pending.   HD session was canceled yesterday. Got bumped due to a more urgent case.   CVP 20. Denies resting dyspnea. Complains of abdominal fullness and poor appetite.   Also complains of left shoulder pain.   New resting tremor. BUN 37   Currently sinus tach, low 100s   Objective:   Weight Range: 121.8 kg Body mass index is 46.82 kg/m.   Vital Signs:   Temp:  [99 F (37.2 C)-99.2 F (37.3 C)] 99.2 F (37.3 C) (08/22 1626) Pulse Rate:  [100-125] 113 (08/23 0700) Resp:  [20-22] 20 (08/23 0000) BP: (93-191)/(33-163) 130/75 (08/23 0700) SpO2:  [89 %-100 %] 95 % (08/23 0700) Weight:  [121.8 kg] 121.8 kg (08/23 0500) Last BM Date: 12/11/20  Weight change: Filed Weights   12/11/20 1920 12/12/20 0200 12/13/20 0500  Weight: 120.6 kg 122.4 kg 121.8 kg    Intake/Output:   Intake/Output  Summary (Last 24 hours) at 12/13/2020 0743 Last data filed at 12/13/2020 0600 Gross per 24 hour  Intake 1019.28 ml  Output --  Net 1019.28 ml     Physical Exam   CVP 20  General: fatigue appearing. Obese, sitting up in chair No respiratory difficulty HEENT: normal Neck: supple. JVD elevated to ear, + Rt IJ HD cath, + LIJ CVC Carotids 2+ bilat; no bruits. No lymphadenopathy or thyromegaly appreciated. Cor: PMI nondisplaced. Regular rhythm, tachy rate. No rubs, gallops or murmurs. Lungs: decreased BS at the bases, no wheezing  Abdomen: obese, oft, nontender, nondistended. No hepatosplenomegaly. No bruits or masses. Good bowel sounds. Extremities: no cyanosis, clubbing, rash, 2+ bilateral LEE up to thighs, bilateral unna boots  Neuro: alert & oriented x 3, cranial nerves grossly intact. moves all 4 extremities w/o difficulty. Affect pleasant.  Telemetry   Sinus tach 110s. Personally reviewed  Labs    CBC Recent Labs    12/12/20 0522 12/13/20 0336  WBC 7.1 8.7  HGB 9.7* 8.8*  HCT 31.3* 28.7*  MCV 107.9* 107.9*  PLT 259 509   Basic Metabolic Panel Recent Labs    12/12/20 0522 12/13/20 0336  NA 133* 127*  K 4.5 4.8  CL 94* 90*  CO2 25 22  GLUCOSE 100* 95  BUN 21 37*  CREATININE 4.32* 6.36*  CALCIUM 9.6 9.2  MG 1.9 1.6*  PHOS 3.8 4.7*   Liver Function Tests Recent Labs    12/12/20 0522 12/13/20 0336  ALBUMIN 3.0* 2.6*    No results for input(s): LIPASE, AMYLASE in the last 72 hours. Cardiac Enzymes No results for input(s): CKTOTAL, CKMB, CKMBINDEX, TROPONINI in the last 72 hours.  BNP: BNP (last 3 results) Recent Labs    11/20/20 0318 11/21/20 0258  BNP 2,051.3* 2,022.4*    ProBNP (last 3 results) No results for input(s): PROBNP in the last 8760 hours.   D-Dimer No results for input(s): DDIMER in the last 72 hours. Hemoglobin A1C No results for input(s): HGBA1C in the last 72 hours.  Fasting Lipid Panel No results for input(s): CHOL, HDL,  LDLCALC, TRIG, CHOLHDL, LDLDIRECT in the last 72 hours. Thyroid Function Tests No results for input(s): TSH, T4TOTAL, T3FREE, THYROIDAB in the last 72 hours.  Invalid input(s): FREET3   Other results:   Imaging    No results found.   Medications:     Scheduled Medications:  (feeding supplement) PROSource Plus  30 mL Oral BID BM   calcitRIOL  0.25 mcg Oral Daily   Chlorhexidine Gluconate Cloth  6 each Topical Daily   Chlorhexidine Gluconate Cloth  6 each Topical Q0600   clotrimazole  1 application Topical BID   colchicine  0.3 mg Oral Daily   estradiol  1 mg Oral Daily   febuxostat  40 mg Oral q AM   ferrous sulfate  325 mg Oral q morning   fluticasone  2 spray Each Nare Daily   fluticasone furoate-vilanterol  1 puff Inhalation Daily   gabapentin  100 mg Oral TID   insulin aspart  0-6 Units Subcutaneous TID WC   levothyroxine  88 mcg Oral q morning   lidocaine  1 patch Transdermal Q24H   midodrine  15 mg Oral TID WC   multivitamin  1 tablet Oral QHS   predniSONE  5 mg Oral Q breakfast   senna-docusate  2 tablet Oral BID   sodium chloride flush  10-40 mL Intracatheter Q12H   sodium chloride flush  3 mL Intravenous Q12H    Infusions:  sodium chloride     sodium chloride     sodium chloride     amiodarone 30 mg/hr (12/13/20 0600)   bivalirudin (ANGIOMAX) infusion 0.5 mg/mL (Non-ACS indications) 0.03 mg/kg/hr (12/13/20 0600)   DOBUTamine 5 mcg/kg/min (12/13/20 0600)   norepinephrine (LEVOPHED) Adult infusion Stopped (12/05/20 0519)    PRN Medications: sodium chloride, sodium chloride, sodium chloride, acetaminophen, albuterol, alteplase, calcium carbonate, cyclobenzaprine, HYDROcodone-acetaminophen, hydrocortisone cream, ondansetron (ZOFRAN) IV, oxyCODONE, sodium chloride flush, sodium chloride flush    Patient Profile   Ms Eggleton is a 65 year old with a history of chronic systolic hf previously EF 45%,  NICM, hypothyroid, PAF, gout, OSA, CKD Stage IV, HTN,  hyperlipidemia, and lupus.   Admitted with A fib RVR and A/C systolic heart failure complicated by cardiogenic shock.     Assessment/Plan   1. Acute/Chronic Systolic Heart Failure -> Cardiogenic shock in setting AF with RVR - 8/1 Lactic acid 1.5. CO-OX 43%.--> started on milrinone and NE - Echo 7/22 EF 30-35% (2021)  This admit -> 25-30%. - NE stopped 8/12, then resumed briefly the morning of 8/15 -- later weaned off.  - CVVHD stopped 8/13. Tolerating iHD on dobutamine, but remains fluid overloaded.   - Unable to tolerate GDMT due to hypotension and AKI - Not candidate for advanced therapies  due to size and ESRD. If unable to tolerate iHD off pressors/ fails inotrope wean, she would require hospice evaluation - Continue midodrine 15 mg TID  - Palliative care now following  - Co-ox low at 35% despite DBA increase to 5. Check Stat repeat Co-ox - Needs aggressive HD this week. Will try to dialyze daily    2. PAF--> Afib RVR - Developed shock and had urgent cardioversion 8/1 with conversion to NSR w/ ERAF - Repeat DCCV 8/4 w/ ERAF - Conversion to sinus tachycardia on 8/10 with IV amio - Afib again starting 8/17, DCCV on 8/19.  - unfortunately, not a candidate for ablation due to size if AF were to recur. Not candidate for AVN ablation and CRT. - plts steadily decreased, so she was switched to Bival. Platelet count now trending up.  - SRA negative. Plts better on bivalirudin.   - Remains in SR/ST today -- continue amiodarone 30 mg/hr while on dobutamine.    3. AKI on CKD Stage IV -> ESRD - Baseline SCr ~3.2, AKI on admission to 7.33 - Renal US with no acute findings - Off CRRT on 8/13. No response to high-dose lasix. Now on iHD but struggling to keep fluid down, even after iHD 8/20 and 8/21. HD bumped 8/22 due to more urgent case. - She remains very volume overloaded. Needs aggressive HD this week. Will try to dialyze daily  - Continue ICU care due to tenuous hemodynamic status while on  iHD    4. OSA - Uses CPAP, but refused overnight    5. Hypothyroidism -TSH 8. T3 ok and T4 2.3  -On synthroid. Dose increased to 88 mcg -Repeat TSH in 6 weeks.    6. Gout - Uric acid 10. Pain RLE that was a questionable flare.  Now resolved  - On uloric 40 mg daily  - Completed 3 day course of prednisone    7. Hypokalemia -- now resolved -Stable.   8. Deconditioning - Continue PT/OT--> CIR following.  - Mobilize today  9 Lupus - on low-dose prednisone.  10. Thrombocytopenia - Plt count of 121 -> 104 -- >62 switched from heparin drip to bival-->73->88 -> 135->259->297  - HIT panel negative  - SRA negative   - No active bleeding noted  - Remains on bivalirudin for now.   11. Tremor - BUN ok at 37 - ? Related to amio - check ammonia level   12. Hypervolemic Hyponatremia - Na 127 - c/w iHD    Brittainy Rosita Fire, PA-C  7:43 AM 12/13/2020  Agree with above.   Remains on DBA 5. Co-ox dropping 35% and 46%.  Did not get HD yesterday and volume status continues to worsen. CVP 18-20. Now with asterixis. Feels bloated. Denies CP or SOB. Remains in NSR on IV amio and bival.   General:  Sitting in chair No resp difficulty HEENT: normal Neck: supple. RIJ HD cath Carotids 2+ bilat; no bruits. No lymphadenopathy or thryomegaly appreciated. Cor: PMI nondisplaced. Regular tachy No rubs, gallops or murmurs. Lungs: clear Abdomen: obese soft, nontender, nondistended. No hepatosplenomegaly. No bruits or masses. Good bowel sounds. Extremities: no cyanosis, clubbing, rash, 3-4+ edema + UNNA Neuro: alert & orientedx3, cranial nerves grossly intact. moves all 4 extremities w/o difficulty. + asterixis Affect pleasant  She is hemodynamically worse in setting of progressive volume overload. Co-ox dropping despite DBA.   I remain concerned that she will not be able to tolerate outpatient iHD but in order to give her fair assessment need  to dialyze daily to try to get her euvolemic and see  if her hemodynamics will improve enough to tolerate iHD and wean DBA. I will d/w Renal - we understand staffing limitations but this is life/death situation for Ms. Tortorelli.   Continue current gtts for now. Limit po intake.   CRITICAL CARE Performed by: Glori Bickers  Total critical care time: 35 minutes  Critical care time was exclusive of separately billable procedures and treating other patients.  Critical care was necessary to treat or prevent imminent or life-threatening deterioration.  Critical care was time spent personally by me (independent of midlevel providers or residents) on the following activities: development of treatment plan with patient and/or surrogate as well as nursing, discussions with consultants, evaluation of patient's response to treatment, examination of patient, obtaining history from patient or surrogate, ordering and performing treatments and interventions, ordering and review of laboratory studies, ordering and review of radiographic studies, pulse oximetry and re-evaluation of patient's condition.  Glori Bickers, MD  9:18 AM

## 2020-12-13 NOTE — Progress Notes (Signed)
[facility] NEPHROLOGY PROGRESS NOTE  Assessment/ Plan:  # Acute kidney Injury on chronic kidney disease stage IV: Sees Dr. Justin Mend at [facility].  Secondary to cardiorenal syndrome initially required CRRT after inadequate response to diuretics.  Now she is on intermittent hemodialysis. Remains hypervolemic and decompensated Goal for daily HD for serial UF, try to get CVP down, off inotropes HD today: try for 4L, 2K, 350/600, Temp HD Cath No sign of renal recovery.   # Acute exacerbation of congestive heart failure: Remains decompensated. on dobutamine for inotropic support; high CVPs, low Co-O2.  Midodrine 15 mg TID.  # Atrial fibrillation with rapid ventricular response: On amiodarone.  Bivalidrudin.   # Hyponatremia: 2/2 Free water excess and low GFR. Trend with HD. Cont fluid restriction as able.   # Anemia of chronic disease/chronic kidney disease: Plan for ESA today with HD.  12/04/20 TSAT 72%  Subjective:  Did not rec HD yesterday For HD today, d/w HD staffing AML K 4.8, Na 27, BUN 37, Hb 8.8 Ca Ox 46.2 this AM, on RA, BPs stable Remains on dobutamine gtt, CVP this AM 15-20 On RA, sitting in chair, conversant, pleasant.  Does develop dyspena with minimal exertion.  BPs stable  Objective Vital signs in last 24 hours: Vitals:   12/13/20 0625 12/13/20 0700 12/13/20 0800 12/13/20 0823  BP: 116/82 130/75 114/76   Pulse: (!) 109 (!) 113 (!) 107   Resp:      Temp:    99.6 F (37.6 C)  TempSrc:    Oral  SpO2: 96% 95% 94%   Weight:      Height:       Weight change: -0.6 kg  Intake/Output Summary (Last 24 hours) at 12/13/2020 0944 Last data filed at 12/13/2020 0800 Gross per 24 hour  Intake 1026.68 ml  Output --  Net 1026.68 ml        Labs: Basic Metabolic Panel: Recent Labs  Lab 12/11/20 0536 12/12/20 0522 12/13/20 0336  NA 133* 133* 127*  K 3.7 4.5 4.8  CL 93* 94* 90*  CO2 26 25 22   GLUCOSE 90 100* 95  BUN 24* 21 37*  CREATININE 5.01* 4.32* 6.36*   CALCIUM 9.5 9.6 9.2  PHOS 4.6 3.8 4.7*    Liver Function Tests: Recent Labs  Lab 12/11/20 0536 12/12/20 0522 12/13/20 0336  ALBUMIN 2.7* 3.0* 2.6*    No results for input(s): LIPASE, AMYLASE in the last 168 hours. No results for input(s): AMMONIA in the last 168 hours. CBC: Recent Labs  Lab 12/08/20 0543 12/09/20 0425 12/10/20 0355 12/12/20 0522 12/13/20 0336  WBC 5.3 5.4 5.8 7.1 8.7  HGB 8.4* 8.2* 8.3* 9.7* 8.8*  HCT 27.3* 25.7* 26.3* 31.3* 28.7*  MCV 104.6* 102.8* 105.6* 107.9* 107.9*  PLT 73* 88* 135* 259 297    Cardiac Enzymes: No results for input(s): CKTOTAL, CKMB, CKMBINDEX, TROPONINI in the last 168 hours. CBG: Recent Labs  Lab 12/12/20 0630 12/12/20 1123 12/12/20 1625 12/12/20 2118 12/13/20 0614  GLUCAP 84 127* 123* 132* 83     Iron Studies: No results for input(s): IRON, TIBC, TRANSFERRIN, FERRITIN in the last 72 hours. Studies/Results: No results found.  Medications: Infusions:  sodium chloride     sodium chloride     sodium chloride     amiodarone 30 mg/hr (12/13/20 0800)   bivalirudin (ANGIOMAX) infusion 0.5 mg/mL (Non-ACS indications) 0.03 mg/kg/hr (12/13/20 0800)   DOBUTamine 5 mcg/kg/min (12/13/20 0800)   magnesium sulfate bolus IVPB  norepinephrine (LEVOPHED) Adult infusion Stopped (12/05/20 0519)    Scheduled Medications:  (feeding supplement) PROSource Plus  30 mL Oral BID BM   calcitRIOL  0.25 mcg Oral Daily   Chlorhexidine Gluconate Cloth  6 each Topical Daily   Chlorhexidine Gluconate Cloth  6 each Topical Q0600   clotrimazole  1 application Topical BID   colchicine  0.3 mg Oral Daily   estradiol  1 mg Oral Daily   febuxostat  40 mg Oral q AM   ferrous sulfate  325 mg Oral q morning   fluticasone  2 spray Each Nare Daily   fluticasone furoate-vilanterol  1 puff Inhalation Daily   gabapentin  100 mg Oral TID   insulin aspart  0-6 Units Subcutaneous TID WC   levothyroxine  88 mcg Oral q morning   lidocaine  1 patch  Transdermal Q24H   midodrine  15 mg Oral TID WC   multivitamin  1 tablet Oral QHS   predniSONE  5 mg Oral Q breakfast   senna-docusate  2 tablet Oral BID   sodium chloride flush  10-40 mL Intracatheter Q12H   sodium chloride flush  3 mL Intravenous Q12H    have reviewed scheduled and prn medications.  Physical Exam: General: Sitting on chair, not in distress Heart: Irregular heart rate, s1s2 nl Lungs: Clear anteriorly, no wheezing Abdomen:soft, Non-tender, non-distended Extremities: 3+ LE edema present Dialysis Access: Right IJ temporary HD catheter in place  Caitlyn Fuentes Caitlyn Fuentes 12/13/2020,9:44 AM  LOS: 23 days

## 2020-12-13 NOTE — Progress Notes (Signed)
ANTICOAGULATION CONSULT NOTE - Follow-Up Consult  Pharmacy Consult for bivalirudin Indication: atrial fibrillation  Patient Measurements: Height: 5' 3.5" (161.3 cm) Weight: 121.8 kg (268 lb 8.3 oz) IBW/kg (Calculated) : 53.55 Heparin Dosing Weight: 85 kg  Vital Signs: BP: 130/75 (08/23 0700) Pulse Rate: 113 (08/23 0700)  Labs: Recent Labs    12/11/20 0536 12/12/20 0522 12/13/20 0336  HGB  --  9.7* 8.8*  HCT  --  31.3* 28.7*  PLT  --  259 297  APTT 68* 58* 70*  CREATININE 5.01* 4.32* 6.36*     Estimated Creatinine Clearance: 11.4 mL/min (A) (by C-G formula based on SCr of 6.36 mg/dL (H)).   Medical History: Past Medical History:  Diagnosis Date   A-fib ([place])    Cardiomyopathy    Hyperlipidemia    Hypertension    Lupus ([place])    Pulmonary hypertension ([place]) 05/10/2011   Echo, EF-40-45   Renal disorder    Sleep apnea 2008   CPAP, pt does not know settings   Thyroid disease     Medications:     Assessment: 65 y.o. female with  h/o Afib, DOAC on hold, for heparin.  Pt was on Xarelto PTA and changed to Eliquis due to procedures.  Eliquis 5 mg last given at 2317 7/31.   Hit antibody negative (0.087). 8/17 Discussed with primary team given significant decline in platelet count will go ahead and change over to bival - SRA negative 8/17.   APTT came back therapeutic at 70, on bival@0 .03 mg/kg/hr. Plt count improving, now at 297. Hgb stable at 8.8. No bleeding issues noted.  Goal of Therapy:  Aptt goal 50-85s Monitor platelets by anticoagulation protocol: Yes   Plan:  Continue bival 0.03 mg/kg/hr Daily aPTT and CBC  Antonietta Jewel, PharmD, [place] Clinical Pharmacist  Phone: [phone] 12/13/2020 7:21 AM  Please check AMION for all [place] phone numbers After 10:00 PM, call [place] [phone]

## 2020-12-14 DIAGNOSIS — N170 Acute kidney failure with tubular necrosis: Secondary | ICD-10-CM | POA: Diagnosis not present

## 2020-12-14 DIAGNOSIS — I4891 Unspecified atrial fibrillation: Secondary | ICD-10-CM | POA: Diagnosis not present

## 2020-12-14 DIAGNOSIS — N184 Chronic kidney disease, stage 4 (severe): Secondary | ICD-10-CM | POA: Diagnosis not present

## 2020-12-14 DIAGNOSIS — I5023 Acute on chronic systolic (congestive) heart failure: Secondary | ICD-10-CM | POA: Diagnosis not present

## 2020-12-14 LAB — COOXEMETRY PANEL
Carboxyhemoglobin: 1.9 % — ABNORMAL HIGH (ref 0.5–1.5)
Methemoglobin: 0.7 % (ref 0.0–1.5)
O2 Saturation: 50.4 %
Total hemoglobin: 9.8 g/dL — ABNORMAL LOW (ref 12.0–16.0)

## 2020-12-14 LAB — RENAL FUNCTION PANEL
Albumin: 2.7 g/dL — ABNORMAL LOW (ref 3.5–5.0)
Anion gap: 12 (ref 5–15)
BUN: 21 mg/dL (ref 8–23)
CO2: 26 mmol/L (ref 22–32)
Calcium: 9.3 mg/dL (ref 8.9–10.3)
Chloride: 93 mmol/L — ABNORMAL LOW (ref 98–111)
Creatinine, Ser: 4.51 mg/dL — ABNORMAL HIGH (ref 0.44–1.00)
GFR, Estimated: 10 mL/min — ABNORMAL LOW (ref >60)
Glucose, Bld: 113 mg/dL — ABNORMAL HIGH (ref 70–99)
Phosphorus: 3.8 mg/dL (ref 2.5–4.6)
Potassium: 3.9 mmol/L (ref 3.5–5.1)
Sodium: 131 mmol/L — ABNORMAL LOW (ref 135–145)

## 2020-12-14 LAB — CBC
HCT: 27.2 % — ABNORMAL LOW (ref 36.0–46.0)
Hemoglobin: 8.5 g/dL — ABNORMAL LOW (ref 12.0–15.0)
MCH: 33.1 pg (ref 26.0–34.0)
MCHC: 31.3 g/dL (ref 30.0–36.0)
MCV: 105.8 fL — ABNORMAL HIGH (ref 80.0–100.0)
Platelets: 277 10*3/uL (ref 150–400)
RBC: 2.57 MIL/uL — ABNORMAL LOW (ref 3.87–5.11)
RDW: 15.3 % (ref 11.5–15.5)
WBC: 6.9 10*3/uL (ref 4.0–10.5)
nRBC: 0.4 % — ABNORMAL HIGH (ref 0.0–0.2)

## 2020-12-14 LAB — APTT: aPTT: 80 seconds — ABNORMAL HIGH (ref 24–36)

## 2020-12-14 LAB — GLUCOSE, CAPILLARY
Glucose-Capillary: 108 mg/dL — ABNORMAL HIGH (ref 70–99)
Glucose-Capillary: 191 mg/dL — ABNORMAL HIGH (ref 70–99)
Glucose-Capillary: 118 mg/dL — ABNORMAL HIGH (ref 70–99)
Glucose-Capillary: 100 mg/dL — ABNORMAL HIGH (ref 70–99)
Glucose-Capillary: 134 mg/dL — ABNORMAL HIGH (ref 70–99)
Glucose-Capillary: 110 mg/dL — ABNORMAL HIGH (ref 70–99)
Glucose-Capillary: 93 mg/dL (ref 70–99)

## 2020-12-14 LAB — HEPARIN LEVEL (UNFRACTIONATED): Heparin Unfractionated: 0.14 IU/mL — ABNORMAL LOW (ref 0.30–0.70)

## 2020-12-14 LAB — MAGNESIUM: Magnesium: 2.1 mg/dL (ref 1.7–2.4)

## 2020-12-14 MED ORDER — HEPARIN (PORCINE) 25000 UT/250ML-% IV SOLN
1550.0000 [IU]/h | INTRAVENOUS | Status: DC
  Administered 2020-12-14: 1200 [IU]/h via INTRAVENOUS
  Administered 2020-12-15: 1550 [IU]/h via INTRAVENOUS
  Administered 2020-12-15: 1400 [IU]/h via INTRAVENOUS
  Administered 2020-12-16 – 2020-12-17 (×3): 1550 [IU]/h via INTRAVENOUS
  Filled 2020-12-14 (×7): qty 250

## 2020-12-14 MED ORDER — GABAPENTIN 100 MG PO CAPS
100.0000 mg | ORAL_CAPSULE | Freq: Every day | ORAL | Status: DC
  Administered 2020-12-15 – 2020-12-19 (×5): 100 mg via ORAL
  Filled 2020-12-14 (×5): qty 1

## 2020-12-14 NOTE — Progress Notes (Addendum)
Patient ID: Caitlyn Fuentes, female   DOB: Jun 01, 1955, 65 y.o.   MRN: [id number]     Advanced Heart Failure Rounding Note  PCP-Cardiologist: Caitlyn Majestic, MD  HF clinic: Dr. Haroldine Fuentes  Subjective:    Events: - 8/1 A fib RVR & A/C systolic heart faiure --> cardiogenic shock. Had urgent DC-CV with brief conversion to NSR but went back in A fib. . On Norepi + milrinone. AKI. Nephrology consulted.  - 8/2 A fib RVR, amio gtt. Continued on milrinone 0.375 mcg + norepi 2 mcg. Started on prednisone 40 mg daily x3 days for gout flare.  - 8/3 did not tolerate milrinone wean w/ drop in Co-ox 68%--->48% and decrease in UOP. Milrinone increased back to 0.375.  Lasix gtt increased to 30/hr.  - 8/4 NE added back given persistently low Co-ox - 8/4 s/p TEE/DCCV>>NSR  - 8/5 back in Afib w/ RVR  -  08/09 HD cath placed, CRRT started - 8/10: conversion to sinus tachycardia  - 8/13 CRRT stopped - 8/15 recurrent AF - 8/16 tolerated iHD - 8/18 tolerated iHD  - 8/19 DCCV to NSR - 8/22 DBA increased to 5   Tolerated iHD yesterday w/ 3.5L in volume removal. CVP 19-20.  Na better 127>>131  Remains on DBA 5. Co-ox remains low but improved from yesterday, 46>>50%.   Asterixis improved some after HD. Less abdominal fullness. Appetite improved. NH3 ok at 20   Remains in NSR on amio gtt. HR 80s-90s.  Overall feeling better today and remains optimistic. Denies resting dyspnea.    Objective:   Weight Range: 122.1 kg Body mass index is 46.94 kg/m.   Vital Signs:   Temp:  [97.8 F (36.6 C)-99.6 F (37.6 C)] 97.8 F (36.6 C) (08/23 2015) Pulse Rate:  [41-134] 103 (08/24 0700) Resp:  [16-20] 20 (08/24 0435) BP: (52-184)/(34-136) 86/59 (08/24 0700) SpO2:  [85 %-100 %] 97 % (08/24 0700) Weight:  [115.6 kg-122.1 kg] 122.1 kg (08/24 0434) Last BM Date: 12/13/20  Weight change: Filed Weights   12/13/20 1630 12/13/20 2015 12/14/20 0434  Weight: 119.6 kg 115.6 kg 122.1 kg     Intake/Output:   Intake/Output Summary (Last 24 hours) at 12/14/2020 0801 Last data filed at 12/14/2020 0600 Gross per 24 hour  Intake 899.78 ml  Output 3500 ml  Net -2600.22 ml     Physical Exam   CVP 19-20  General:  Well appearing, obese, sitting up in chair. No respiratory difficulty HEENT: normal Neck: supple. JVD elevated to ear, + Rt IJ HD cath and Lt IJ CVCCarotids 2+ bilat; no bruits. No lymphadenopathy or thyromegaly appreciated. Cor: PMI nondisplaced. Regular rate & rhythm. No rubs, gallops or murmurs. Lungs: clear Abdomen: obese, soft, nontender, nondistended. No hepatosplenomegaly. No bruits or masses. Good bowel sounds. Extremities: no cyanosis, clubbing, rash, 2-3+ bilateral LEE up to thighs edema +unna boots  Neuro: alert & oriented x 3, cranial nerves grossly intact. moves all 4 extremities w/o difficulty. Affect pleasant.   Telemetry   NSR 80s-90s. 5 beat run of NSVT x 1 Personally reviewed  Labs    CBC Recent Labs    12/13/20 0336 12/14/20 0317  WBC 8.7 6.9  HGB 8.8* 8.5*  HCT 28.7* 27.2*  MCV 107.9* 105.8*  PLT 297 384   Basic Metabolic Panel Recent Labs    12/13/20 0336 12/14/20 0317  NA 127* 131*  K 4.8 3.9  CL 90* 93*  CO2 22 26  GLUCOSE 95 113*  BUN 37* 21  CREATININE 6.36*  4.51*  CALCIUM 9.2 9.3  MG 1.6* 2.1  PHOS 4.7* 3.8   Liver Function Tests Recent Labs    12/13/20 0336 12/14/20 0317  ALBUMIN 2.6* 2.7*    No results for input(s): LIPASE, AMYLASE in the last 72 hours. Cardiac Enzymes No results for input(s): CKTOTAL, CKMB, CKMBINDEX, TROPONINI in the last 72 hours.  BNP: BNP (last 3 results) Recent Labs    11/20/20 0318 11/21/20 0258  BNP 2,051.3* 2,022.4*    ProBNP (last 3 results) No results for input(s): PROBNP in the last 8760 hours.   D-Dimer No results for input(s): DDIMER in the last 72 hours. Hemoglobin A1C No results for input(s): HGBA1C in the last 72 hours.  Fasting Lipid Panel No results  for input(s): CHOL, HDL, LDLCALC, TRIG, CHOLHDL, LDLDIRECT in the last 72 hours. Thyroid Function Tests No results for input(s): TSH, T4TOTAL, T3FREE, THYROIDAB in the last 72 hours.  Invalid input(s): FREET3   Other results:   Imaging    No results found.   Medications:     Scheduled Medications:  (feeding supplement) PROSource Plus  30 mL Oral BID BM   calcitRIOL  0.25 mcg Oral Daily   Chlorhexidine Gluconate Cloth  6 each Topical Daily   Chlorhexidine Gluconate Cloth  6 each Topical Q0600   clotrimazole  1 application Topical BID   colchicine  0.3 mg Oral Daily   darbepoetin (ARANESP) injection - DIALYSIS  60 mcg Intravenous Q Tue-HD   estradiol  1 mg Oral Daily   febuxostat  40 mg Oral q AM   ferrous sulfate  325 mg Oral q morning   fluticasone  2 spray Each Nare Daily   fluticasone furoate-vilanterol  1 puff Inhalation Daily   gabapentin  100 mg Oral TID   insulin aspart  0-6 Units Subcutaneous TID WC   levothyroxine  88 mcg Oral q morning   lidocaine  1 patch Transdermal Q24H   midodrine  15 mg Oral TID WC   multivitamin  1 tablet Oral QHS   predniSONE  5 mg Oral Q breakfast   senna-docusate  2 tablet Oral BID   sodium chloride flush  10-40 mL Intracatheter Q12H   sodium chloride flush  3 mL Intravenous Q12H    Infusions:  sodium chloride     sodium chloride     sodium chloride     amiodarone 30 mg/hr (12/14/20 0600)   anticoagulant sodium citrate     bivalirudin (ANGIOMAX) infusion 0.5 mg/mL (Non-ACS indications) 0.03 mg/kg/hr (12/14/20 0600)   DOBUTamine 5 mcg/kg/min (12/14/20 0600)   norepinephrine (LEVOPHED) Adult infusion Stopped (12/13/20 0952)    PRN Medications: sodium chloride, sodium chloride, sodium chloride, acetaminophen, albuterol, alteplase, anticoagulant sodium citrate, calcium carbonate, cyclobenzaprine, HYDROcodone-acetaminophen, hydrocortisone cream, ondansetron (ZOFRAN) IV, oxyCODONE, sodium chloride flush, sodium chloride  flush    Patient Profile   Caitlyn Fuentes is a 65 year old with a history of chronic systolic hf previously EF 45%,  NICM, hypothyroid, PAF, gout, OSA, CKD Stage IV, HTN, hyperlipidemia, and lupus.   Admitted with A fib RVR and A/C systolic heart failure complicated by cardiogenic shock.     Assessment/Plan   1. Acute/Chronic Systolic Heart Failure -> Cardiogenic shock in setting AF with RVR - 8/1 Lactic acid 1.5. CO-OX 43%.--> started on milrinone and NE - Echo 7/22 EF 30-35% (2021)  This admit -> 25-30%. - NE stopped 8/12, then resumed briefly the morning of 8/15 -- later weaned off.  - CVVHD stopped 8/13. Tolerating  iHD on dobutamine, but remains fluid overloaded.   - on DBA 5, Co-ox marginal at 50%  - Unable to tolerate GDMT due to hypotension and AKI - Not candidate for advanced therapies due to size and ESRD. If unable to tolerate iHD off pressors/ fails inotrope wean, she would require hospice evaluation - Continue midodrine 15 mg TID  - Palliative care now following  - Needs aggressive HD this week. Will try to dialyze daily. Appreciate nephrology's assistance     2. PAF--> Afib RVR - Developed shock and had urgent cardioversion 8/1 with conversion to NSR w/ ERAF - Repeat DCCV 8/4 w/ ERAF - Conversion to sinus tachycardia on 8/10 with IV amio - Afib again starting 8/17, DCCV on 8/19.  - unfortunately, not a candidate for ablation due to size if AF were to recur. Not candidate for AVN ablation and CRT. - plts steadily decreased, so she was switched to Bival.  - SRA negative.  - Plts better on bivalirudin and stable  - continue amiodarone gtt 30 mg/hr while on dobutamine.    3. AKI on CKD Stage IV -> ESRD - Baseline SCr ~3.2, AKI on admission to 7.33 - Renal US with no acute findings - Off CRRT on 8/13. No response to high-dose lasix. Now on iHD but struggling to keep fluid down, even after iHD 8/20 and 8/21. HD bumped 8/22 due to more urgent case. - She remains very  volume overloaded. Needs aggressive HD this week. Will try to dialyze daily  - Continue ICU care due to tenuous hemodynamic status while on iHD    4. OSA - Uses CPAP, but refused overnight    5. Hypothyroidism -TSH 8. T3 ok and T4 2.3  -On synthroid. Dose increased to 88 mcg -Repeat TSH in 6 weeks.    6. Gout - Uric acid 10. Pain RLE that was a questionable flare.  Now resolved  - On uloric 40 mg daily  - Completed 3 day course of prednisone    7. Hypokalemia -- now resolved -Stable.   8. Deconditioning - Continue PT/OT--> CIR following.  - Mobilize today  9 Lupus - on low-dose prednisone.  10. Thrombocytopenia - Plt count of 121 -> 104 -- >62 switched from heparin drip to bival  - HIT panel negative  - SRA negative   - Plt ct now improved/ stable 277 today  - No active bleeding noted  - Remains on bivalirudin for now.   11. Tremor/ Asterixis  - suspect due to uremia. Improving w/ HD  -  NH3 ok at 20   12. Hypervolemic Hyponatremia - improving w/ HD - Na 127>>131    Lyda Jester, PA-C  8:01 AM 12/14/2020  Agree with above.   Remains on DBA 5. Co-ox marginal at 50%. Tolerated iHD yesterday with 4L off but CVP still close to 20. SBPs remain margianl in the 90s. On IV amio and bival for AF. Remains in NSR. SRA negative for HIT. Denies SOB, orthopnea or PND. Asterixis improved  General:  Sitting up in chair No resp difficulty HEENT: normal Neck: supple. RIJ HD cath Carotids 2+ bilat; no bruits. No lymphadenopathy or thryomegaly appreciated. Cor: PMI nondisplaced. Regular tachy  No rubs, gallops or murmurs. Lungs: clear Abdomen: obese soft, nontender, nondistended. No hepatosplenomegaly. No bruits or masses. Good bowel sounds. Extremities: no cyanosis, clubbing, rash, 2-3+ edema + UNNA Neuro: alert & orientedx3, cranial nerves grossly intact. moves all 4 extremities w/o difficulty. Affect pleasant  Remains tenuous. Co-ox  improved with volume removal and  decongestion of RV but CVP remains quite high. Requirind DBA and midodrine to maintain BP. Spoke with Renal again and hopefully we can get her dialyzed several days in a row to get CVP down and see if she will be able to tolerated outpatient iHD. Can switch bival to heparin with negative SRA.   CRITICAL CARE Performed by: Glori Bickers  Total critical care time: 35 minutes  Critical care time was exclusive of separately billable procedures and treating other patients.  Critical care was necessary to treat or prevent imminent or life-threatening deterioration.  Critical care was time spent personally by me (independent of midlevel providers or residents) on the following activities: development of treatment plan with patient and/or surrogate as well as nursing, discussions with consultants, evaluation of patient's response to treatment, examination of patient, obtaining history from patient or surrogate, ordering and performing treatments and interventions, ordering and review of laboratory studies, ordering and review of radiographic studies, pulse oximetry and re-evaluation of patient's condition.  Glori Bickers, MD  10:14 AM

## 2020-12-14 NOTE — Progress Notes (Signed)
ANTICOAGULATION CONSULT NOTE - Follow-Up Consult  Pharmacy Consult for bivalirudin>>heparin Indication: atrial fibrillation  Patient Measurements: Height: 5' 3.5" (161.3 cm) Weight: 122.1 kg (269 lb 2.9 oz) IBW/kg (Calculated) : 53.55 Heparin Dosing Weight: 85 kg  Vital Signs: Temp: 97.8 F (36.6 C) (08/24 0809) Temp Source: Oral (08/24 0809) BP: 94/61 (08/24 0900) Pulse Rate: 97 (08/24 0900)  Labs: Recent Labs    12/12/20 0522 12/13/20 0336 12/14/20 0317  HGB 9.7* 8.8* 8.5*  HCT 31.3* 28.7* 27.2*  PLT 259 297 277  APTT 58* 70* 80*  CREATININE 4.32* 6.36* 4.51*     Estimated Creatinine Clearance: 16.1 mL/min (A) (by C-G formula based on SCr of 4.51 mg/dL (H)).   Medical History: Past Medical History:  Diagnosis Date   A-fib ([place])    Cardiomyopathy    Hyperlipidemia    Hypertension    Lupus ([place])    Pulmonary hypertension ([place]) 05/10/2011   Echo, EF-40-45   Renal disorder    Sleep apnea 2008   CPAP, pt does not know settings   Thyroid disease     Medications:     Assessment: 65 y.o. female with  h/o Afib, DOAC on hold, for heparin.  Pt was on Xarelto PTA and changed to Eliquis due to procedures.  Eliquis 5 mg last given at 2317 7/31.   Hit antibody negative (0.087). 8/17 Discussed with primary team given significant decline in platelet count will go ahead and change over to bival - SRA negative 8/17.   Discussed with team, will change from bivalirudin to heparin infusion given HIT negative and plt recovered. Was last therapeutic on heparin infusion at 1450 units/hr before changing to bivalirudin. Hgb 8.5, plt 277. No s/sx of bleeding or infusion issues.   Goal of Therapy:  Heparin infusion: 0.3-0.7  Monitor platelets by anticoagulation protocol: Yes   Plan:  Stop bivalirudin  Start heparin infusion at 1200 units/hr >> 8 hr HL Monitor HL, CBC, and for s/sx of bleeding   Antonietta Jewel, PharmD, [place] Pharmacist  Phone: [phone] 12/14/2020  10:02 AM  Please check AMION for all [place] phone numbers After 10:00 PM, call [place] [phone]

## 2020-12-14 NOTE — Progress Notes (Signed)
[facility] NEPHROLOGY PROGRESS NOTE  Assessment/ Plan:  # Acute kidney Injury on chronic kidney disease stage IV: Caitlyn Fuentes at [employer].  Secondary to cardiorenal syndrome initially required CRRT after inadequate response to diuretics.  Now she is on intermittent hemodialysis. Remains hypervolemic and decompensated Goal for daily HD for serial UF, try to get CVP down, off inotropes HD today: try for 4L, 3K, 300/500, Temp HD Cath No sign of renal recovery.   # Acute exacerbation of congestive heart failure: Remains decompensated. on dobutamine for inotropic support; high CVPs, low Co-O2.  Midodrine 15 mg TID.  # Atrial fibrillation with rapid ventricular response: On amiodarone.  Bivalidrudin.   # Hyponatremia: 2/2 Free water excess and low GFR. Trend with HD. Cont fluid restriction as able.   # Anemia of chronic disease/chronic kidney disease: Cont ESA.  12/04/20 TSAT 72%  Subjective:  HD yesterday, 3.5L UF CVP 14 this morning, remains on dobutamine Ca Ox 50.4 Patient feels improved this morning, still dyspneic with exertion  Objective Vital signs in last 24 hours: Vitals:   12/14/20 0800 12/14/20 0809 12/14/20 0819 12/14/20 0900  BP: (!) 84/66   94/61  Pulse: 99   97  Resp:      Temp:  97.8 F (36.6 C)    TempSrc:  Oral    SpO2: 98%  100% 95%  Weight:      Height:       Weight change: -2.2 kg  Intake/Output Summary (Last 24 hours) at 12/14/2020 0912 Last data filed at 12/14/2020 0701 Gross per 24 hour  Intake 900.73 ml  Output 3500 ml  Net -2599.27 ml        Labs: Basic Metabolic Panel: Recent Labs  Lab 12/12/20 0522 12/13/20 0336 12/14/20 0317  NA 133* 127* 131*  K 4.5 4.8 3.9  CL 94* 90* 93*  CO2 25 22 26   GLUCOSE 100* 95 113*  BUN 21 37* 21  CREATININE 4.32* 6.36* 4.51*  CALCIUM 9.6 9.2 9.3  PHOS 3.8 4.7* 3.8    Liver Function Tests: Recent Labs  Lab 12/12/20 0522 12/13/20 0336 12/14/20 0317  ALBUMIN 3.0* 2.6* 2.7*    No  results for input(s): LIPASE, AMYLASE in the last 168 hours. Recent Labs  Lab 12/13/20 1115  AMMONIA 20   CBC: Recent Labs  Lab 12/09/20 0425 12/10/20 0355 12/12/20 0522 12/13/20 0336 12/14/20 0317  WBC 5.4 5.8 7.1 8.7 6.9  HGB 8.2* 8.3* 9.7* 8.8* 8.5*  HCT 25.7* 26.3* 31.3* 28.7* 27.2*  MCV 102.8* 105.6* 107.9* 107.9* 105.8*  PLT 88* 135* 259 297 277    Cardiac Enzymes: No results for input(s): CKTOTAL, CKMB, CKMBINDEX, TROPONINI in the last 168 hours. CBG: Recent Labs  Lab 12/13/20 0614 12/13/20 1202 12/13/20 1639 12/13/20 2117 12/14/20 0615  GLUCAP 83 130* 105* 120* 100*     Iron Studies: No results for input(s): IRON, TIBC, TRANSFERRIN, FERRITIN in the last 72 hours. Studies/Results: No results found.  Medications: Infusions:  sodium chloride     sodium chloride     sodium chloride     amiodarone 30 mg/hr (12/14/20 0800)   anticoagulant sodium citrate     bivalirudin (ANGIOMAX) infusion 0.5 mg/mL (Non-ACS indications) 0.03 mg/kg/hr (12/14/20 0800)   DOBUTamine 5 mcg/kg/min (12/14/20 0800)   norepinephrine (LEVOPHED) Adult infusion Stopped (12/13/20 6384)    Scheduled Medications:  (feeding supplement) PROSource Plus  30 mL Oral BID BM   calcitRIOL  0.25 mcg Oral Daily   Chlorhexidine Gluconate Cloth  6 each Topical Daily   Chlorhexidine Gluconate Cloth  6 each Topical Q0600   clotrimazole  1 application Topical BID   colchicine  0.3 mg Oral Daily   darbepoetin (ARANESP) injection - DIALYSIS  60 mcg Intravenous Q Tue-HD   estradiol  1 mg Oral Daily   febuxostat  40 mg Oral q AM   ferrous sulfate  325 mg Oral q morning   fluticasone  2 spray Each Nare Daily   fluticasone furoate-vilanterol  1 puff Inhalation Daily   gabapentin  100 mg Oral TID   insulin aspart  0-6 Units Subcutaneous TID WC   levothyroxine  88 mcg Oral q morning   lidocaine  1 patch Transdermal Q24H   midodrine  15 mg Oral TID WC   multivitamin  1 tablet Oral QHS   predniSONE  5  mg Oral Q breakfast   senna-docusate  2 tablet Oral BID   sodium chloride flush  10-40 mL Intracatheter Q12H   sodium chloride flush  3 mL Intravenous Q12H    have reviewed scheduled and prn medications.  Physical Exam: General: Sitting on chair, not in distress Heart: Irregular heart rate, s1s2 nl Lungs: Clear anteriorly, no wheezing Abdomen:soft, Non-tender, non-distended Extremities: 3+ LE edema present Dialysis Access: Right IJ temporary HD catheter in place  Epiphany Seltzer B Glessie Eustice 12/14/2020,9:12 AM  LOS: 24 days

## 2020-12-14 NOTE — Progress Notes (Signed)
ANTICOAGULATION CONSULT NOTE - Follow-Up Consult  Pharmacy Consult for bivalirudin Indication: atrial fibrillation  Patient Measurements: Height: 5' 3.5" (161.3 cm) Weight: 122.1 kg (269 lb 2.9 oz) IBW/kg (Calculated) : 53.55 Heparin Dosing Weight: 85 kg  Vital Signs: Temp: 97.8 F (36.6 C) (08/23 2015) Temp Source: Oral (08/23 2015) BP: 86/59 (08/24 0700) Pulse Rate: 103 (08/24 0700)  Labs: Recent Labs    12/12/20 0522 12/13/20 0336 12/14/20 0317  HGB 9.7* 8.8* 8.5*  HCT 31.3* 28.7* 27.2*  PLT 259 297 277  APTT 58* 70* 80*  CREATININE 4.32* 6.36* 4.51*     Estimated Creatinine Clearance: 16.1 mL/min (A) (by C-G formula based on SCr of 4.51 mg/dL (H)).   Medical History: Past Medical History:  Diagnosis Date   A-fib ([place])    Cardiomyopathy    Hyperlipidemia    Hypertension    Lupus ([place])    Pulmonary hypertension ([place]) 05/10/2011   Echo, EF-40-45   Renal disorder    Sleep apnea 2008   CPAP, pt does not know settings   Thyroid disease     Medications:     Assessment: 65 y.o. female with  h/o Afib, DOAC on hold, for heparin.  Pt was on Xarelto PTA and changed to Eliquis due to procedures.  Eliquis 5 mg last given at 2317 7/31.   Hit antibody negative (0.087). 8/17 Discussed with primary team given significant decline in platelet count will go ahead and change over to bival - SRA negative 8/17.   APTT came back therapeutic at 80 (consistently trending up), on bival@0 .03 mg/kg/hr. Plt count improving, now at 277. Hgb stable at 8.5. No bleeding issues noted.  Goal of Therapy:  Aptt goal 50-85s Monitor platelets by anticoagulation protocol: Yes   Plan:  Continue bival 0.03 mg/kg/hr Will recheck aPTT at 1200 to see if continuing to trend up and need to adjust down Daily aPTT and CBC  Antonietta Jewel, PharmD, [place] Pharmacist  Phone: [phone] 12/14/2020 8:00 AM  Please check AMION for all [place] phone numbers After 10:00 PM, call [place] [phone]

## 2020-12-14 NOTE — Progress Notes (Signed)
ANTICOAGULATION CONSULT NOTE - Follow-Up Consult  Pharmacy Consult for bivalirudin>>heparin Indication: atrial fibrillation  Patient Measurements: Height: 5' 3.5" (161.3 cm) Weight: 122.1 kg (269 lb 2.9 oz) IBW/kg (Calculated) : 53.55 Heparin Dosing Weight: 85 kg  Vital Signs: Temp: 97.9 F (36.6 C) (08/24 1950) Temp Source: Oral (08/24 1950) BP: 101/71 (08/24 1800) Pulse Rate: 91 (08/24 1800)  Labs: Recent Labs    12/12/20 0522 12/13/20 0336 12/14/20 0317 12/14/20 1901  HGB 9.7* 8.8* 8.5*  --   HCT 31.3* 28.7* 27.2*  --   PLT 259 297 277  --   APTT 58* 70* 80*  --   HEPARINUNFRC  --   --   --  0.14*  CREATININE 4.32* 6.36* 4.51*  --      Estimated Creatinine Clearance: 16.1 mL/min (A) (by C-G formula based on SCr of 4.51 mg/dL (H)).   Assessment: 65 y.o. female with  h/o Afib, DOAC on hold, for heparin.  Pt was on Xarelto PTA and changed to Eliquis due to procedures.  Eliquis 5 mg last given at 2317 7/31.   Hit antibody negative (0.087). 8/17 Discussed with primary team given significant decline in platelet count will go ahead and change over to bival - SRA negative 8/17.   Discussed with team, will change from bivalirudin to heparin infusion given HIT negative and plt recovered. Was last therapeutic on heparin infusion at 1400-1450 units/hr before changing to bivalirudin.   Heparin level sub-therapeutic.  No complications noted.  Goal of Therapy:  Heparin infusion: 0.3-0.7 units/mL Monitor platelets by anticoagulation protocol: Yes   Plan:  Increase heparin infusion to 1400 units/hr Check 8 hr heparin level Monitor HL, CBC, and for s/sx of bleeding   Ott Zimmerle D. Mina Marble, PharmD, BCPS, [place] 12/14/2020, 8:03 PM

## 2020-12-14 NOTE — Progress Notes (Signed)
Physical Therapy Treatment Patient Details Name: Caitlyn Fuentes MRN: [id number] DOB: 04-17-1956 Today's Date: 12/14/2020    History of Present Illness Pt is a 65 y/o female presenting on 11/20/20 with worsening palpitations, SOB and LE edema. Pt found in rapid a fib, chest x-ray with significant vascular congestion. 8/1 hypotensive with cardiogenic shock, transferred to ICU and urgent cardioversion.   S/P TEE and cardioversion 8/4. CRRT 8/8-8/13. DCCV with NSR 8/19.PMHx: afib, cardiomyopathy, HTN, lupus, PAF, CKD.    PT Comments    Pt making steady progress despite limitations of fluid build up/HD cycle.  Expect slower gains during her present situation.  Emphasis by pt on progressing gait and staying up on her feet with standing tasks.  VSS on RA overall.    Follow Up Recommendations  CIR;Supervision/Assistance - 24 hour     Equipment Recommendations  Rolling walker with 5" wheels    Recommendations for Other Services Rehab consult     Precautions / Restrictions Precautions Precautions: Fall    Mobility  Bed Mobility               General bed mobility comments: OOB in recliner upon entry    Transfers Overall transfer level: Needs assistance   Transfers: Sit to/from Stand Sit to Stand: Min guard         General transfer comment: x4 from various heights with armrest.  cues for hand placement  Ambulation/Gait Ambulation/Gait assistance: Min guard Gait Distance (Feet): 24 Feet (then  42 feet, 15 feet and 25 feet with rest in between or standing at sink.) Assistive device: Rolling walker (2 wheeled) Gait Pattern/deviations: Step-through pattern;Decreased stride length   Gait velocity interpretation: <1.8 ft/sec, indicate of risk for recurrent falls General Gait Details: generally steady gait with minimal use of the RW.  mild dyspnea with SpO2 staying in the upper 90's on RA  HR in the up 90's bpm throughout.   Stairs             Wheelchair Mobility     Modified Rankin (Stroke Patients Only)       Balance Overall balance assessment: Needs assistance   Sitting balance-Leahy Scale: Fair       Standing balance-Leahy Scale: Fair Standing balance comment: pt stood at sink completing brushing teeth, reaching for necessary items over a 2-3 min period before needing to sit for rest break.                            Cognition Arousal/Alertness: Awake/alert Behavior During Therapy: WFL for tasks assessed/performed Overall Cognitive Status: Within Functional Limits for tasks assessed                                        Exercises      General Comments        Pertinent Vitals/Pain Pain Assessment: Faces Faces Pain Scale: No hurt Pain Intervention(s): Monitored during session    Home Living                      Prior Function            PT Goals (current goals can now be found in the care plan section) Acute Rehab PT Goals Patient Stated Goal: to be able to go home PT Goal Formulation: With patient Time For Goal Achievement: 12/26/20 Potential to Achieve  Goals: Fair Progress towards PT goals: Progressing toward goals    Frequency    Min 3X/week      PT Plan Current plan remains appropriate    Co-evaluation              AM-PAC PT "6 Clicks" Mobility   Outcome Measure  Help needed turning from your back to your side while in a flat bed without using bedrails?: A Little Help needed moving from lying on your back to sitting on the side of a flat bed without using bedrails?: A Little Help needed moving to and from a bed to a chair (including a wheelchair)?: A Little Help needed standing up from a chair using your arms (e.g., wheelchair or bedside chair)?: A Little Help needed to walk in hospital room?: A Little Help needed climbing 3-5 steps with a railing? : A Lot 6 Click Score: 17    End of Session   Activity Tolerance: Patient limited by fatigue Patient left:  in chair;with call bell/phone within reach;with nursing/sitter in room Nurse Communication: Mobility status PT Visit Diagnosis: Other abnormalities of gait and mobility (R26.89);Muscle weakness (generalized) (M62.81);Difficulty in walking, not elsewhere classified (R26.2)     Time: 5369-2230 PT Time Calculation (min) (ACUTE ONLY): 25 min  Charges:  $Gait Training: 8-22 mins $Therapeutic Activity: 8-22 mins                     12/14/2020  [name], PT Acute Rehabilitation Services [phone]  (pager) [phone]  (office)   [name] Tharun Cappella 12/14/2020, 5:37 PM

## 2020-12-15 ENCOUNTER — Inpatient Hospital Stay (HOSPITAL_COMMUNITY): Payer: Medicare Other

## 2020-12-15 DIAGNOSIS — I4891 Unspecified atrial fibrillation: Secondary | ICD-10-CM | POA: Diagnosis not present

## 2020-12-15 DIAGNOSIS — R570 Cardiogenic shock: Secondary | ICD-10-CM | POA: Diagnosis not present

## 2020-12-15 DIAGNOSIS — I5023 Acute on chronic systolic (congestive) heart failure: Secondary | ICD-10-CM | POA: Diagnosis not present

## 2020-12-15 LAB — CBC
HCT: 28.9 % — ABNORMAL LOW (ref 36.0–46.0)
Hemoglobin: 9 g/dL — ABNORMAL LOW (ref 12.0–15.0)
MCH: 32.8 pg (ref 26.0–34.0)
MCHC: 31.1 g/dL (ref 30.0–36.0)
MCV: 105.5 fL — ABNORMAL HIGH (ref 80.0–100.0)
Platelets: 323 10*3/uL (ref 150–400)
RBC: 2.74 MIL/uL — ABNORMAL LOW (ref 3.87–5.11)
RDW: 15.4 % (ref 11.5–15.5)
WBC: 7.6 10*3/uL (ref 4.0–10.5)
nRBC: 0.3 % — ABNORMAL HIGH (ref 0.0–0.2)

## 2020-12-15 LAB — RENAL FUNCTION PANEL
Albumin: 2.9 g/dL — ABNORMAL LOW (ref 3.5–5.0)
Albumin: 2.9 g/dL — ABNORMAL LOW (ref 3.5–5.0)
Anion gap: 17 — ABNORMAL HIGH (ref 5–15)
Anion gap: 12 (ref 5–15)
BUN: 39 mg/dL — ABNORMAL HIGH (ref 8–23)
BUN: 18 mg/dL (ref 8–23)
CO2: 24 mmol/L (ref 22–32)
CO2: 25 mmol/L (ref 22–32)
Calcium: 9.8 mg/dL (ref 8.9–10.3)
Calcium: 8.7 mg/dL — ABNORMAL LOW (ref 8.9–10.3)
Chloride: 89 mmol/L — ABNORMAL LOW (ref 98–111)
Chloride: 95 mmol/L — ABNORMAL LOW (ref 98–111)
Creatinine, Ser: 6.26 mg/dL — ABNORMAL HIGH (ref 0.44–1.00)
Creatinine, Ser: 3.33 mg/dL — ABNORMAL HIGH (ref 0.44–1.00)
GFR, Estimated: 7 mL/min — ABNORMAL LOW (ref >60)
GFR, Estimated: 15 mL/min — ABNORMAL LOW (ref >60)
Glucose, Bld: 104 mg/dL — ABNORMAL HIGH (ref 70–99)
Glucose, Bld: 112 mg/dL — ABNORMAL HIGH (ref 70–99)
Phosphorus: 5.4 mg/dL — ABNORMAL HIGH (ref 2.5–4.6)
Phosphorus: 1.9 mg/dL — ABNORMAL LOW (ref 2.5–4.6)
Potassium: 4.1 mmol/L (ref 3.5–5.1)
Potassium: 4.7 mmol/L (ref 3.5–5.1)
Sodium: 130 mmol/L — ABNORMAL LOW (ref 135–145)
Sodium: 132 mmol/L — ABNORMAL LOW (ref 135–145)

## 2020-12-15 LAB — GLUCOSE, CAPILLARY
Glucose-Capillary: 94 mg/dL (ref 70–99)
Glucose-Capillary: 107 mg/dL — ABNORMAL HIGH (ref 70–99)
Glucose-Capillary: 116 mg/dL — ABNORMAL HIGH (ref 70–99)
Glucose-Capillary: 108 mg/dL — ABNORMAL HIGH (ref 70–99)

## 2020-12-15 LAB — COOXEMETRY PANEL
Carboxyhemoglobin: 1.6 % — ABNORMAL HIGH (ref 0.5–1.5)
Methemoglobin: 0.6 % (ref 0.0–1.5)
O2 Saturation: 63.3 %
Total hemoglobin: 9.2 g/dL — ABNORMAL LOW (ref 12.0–16.0)

## 2020-12-15 LAB — HEPARIN LEVEL (UNFRACTIONATED)
Heparin Unfractionated: 0.22 IU/mL — ABNORMAL LOW (ref 0.30–0.70)
Heparin Unfractionated: 0.29 IU/mL — ABNORMAL LOW (ref 0.30–0.70)

## 2020-12-15 LAB — MAGNESIUM: Magnesium: 2.1 mg/dL (ref 1.7–2.4)

## 2020-12-15 MED ORDER — PROCHLORPERAZINE EDISYLATE 10 MG/2ML IJ SOLN
5.0000 mg | Freq: Four times a day (QID) | INTRAMUSCULAR | Status: DC | PRN
  Filled 2020-12-15: qty 1

## 2020-12-15 MED ORDER — HEPARIN SODIUM (PORCINE) 1000 UNIT/ML DIALYSIS
1000.0000 [IU] | INTRAMUSCULAR | Status: DC | PRN
  Administered 2020-12-15 – 2020-12-18 (×7): 2400 [IU] via INTRAVENOUS_CENTRAL
  Filled 2020-12-15: qty 6
  Filled 2020-12-15: qty 4
  Filled 2020-12-15: qty 6
  Filled 2020-12-15: qty 3
  Filled 2020-12-15: qty 6
  Filled 2020-12-15: qty 3
  Filled 2020-12-15 (×3): qty 6
  Filled 2020-12-15: qty 3
  Filled 2020-12-15: qty 6
  Filled 2020-12-15 (×2): qty 3
  Filled 2020-12-15: qty 6
  Filled 2020-12-15 (×2): qty 3

## 2020-12-15 MED ORDER — PROCHLORPERAZINE EDISYLATE 10 MG/2ML IJ SOLN
10.0000 mg | Freq: Four times a day (QID) | INTRAMUSCULAR | Status: DC | PRN
  Filled 2020-12-15: qty 2

## 2020-12-15 MED ORDER — PRISMASOL BGK 4/2.5 32-4-2.5 MEQ/L REPLACEMENT SOLN: Status: DC

## 2020-12-15 MED ORDER — KIDNEY FAILURE BOOK: Freq: Once | Status: AC

## 2020-12-15 MED ORDER — SODIUM CHLORIDE 0.9 % IV SOLN: 100.0000 mL | INTRAVENOUS | Status: DC | PRN

## 2020-12-15 MED ORDER — HEPARIN SODIUM (PORCINE) 1000 UNIT/ML DIALYSIS
1000.0000 [IU] | INTRAMUSCULAR | Status: DC | PRN
  Administered 2020-12-16: 1000 [IU] via INTRAVENOUS_CENTRAL

## 2020-12-15 MED ORDER — PENTAFLUOROPROP-TETRAFLUOROETH EX AERO: 1.0000 "application " | INHALATION_SPRAY | CUTANEOUS | Status: DC | PRN

## 2020-12-15 MED ORDER — LIDOCAINE-PRILOCAINE 2.5-2.5 % EX CREA
1.0000 "application " | TOPICAL_CREAM | CUTANEOUS | Status: DC | PRN
  Filled 2020-12-15: qty 5

## 2020-12-15 MED ORDER — LIDOCAINE HCL (PF) 1 % IJ SOLN
5.0000 mL | INTRAMUSCULAR | Status: DC | PRN
  Filled 2020-12-15: qty 5

## 2020-12-15 MED ORDER — SODIUM CHLORIDE 0.9 % FOR CRRT: INTRAVENOUS_CENTRAL | Status: DC | PRN

## 2020-12-15 MED ORDER — ENSURE ENLIVE PO LIQD
237.0000 mL | Freq: Two times a day (BID) | ORAL | Status: DC
  Administered 2020-12-15 – 2020-12-18 (×4): 237 mL via ORAL

## 2020-12-15 MED ORDER — SODIUM CHLORIDE 0.9 % IV SOLN
100.0000 mL | INTRAVENOUS | Status: DC | PRN
Start: 2020-12-15 — End: 2020-12-18

## 2020-12-15 MED ORDER — PRISMASOL BGK 4/2.5 32-4-2.5 MEQ/L EC SOLN: Status: DC

## 2020-12-15 MED ORDER — LIDOCAINE HCL (PF) 1 % IJ SOLN: 5.0000 mL | INTRAMUSCULAR | Status: DC | PRN

## 2020-12-15 MED ORDER — ALTEPLASE 2 MG IJ SOLR
2.0000 mg | Freq: Once | INTRAMUSCULAR | Status: AC | PRN
  Administered 2020-12-17: 2 mg
  Filled 2020-12-15: qty 2

## 2020-12-15 MED ORDER — ALBUMIN HUMAN 25 % IV SOLN
INTRAVENOUS | Status: AC
  Administered 2020-12-15: 25 g
  Filled 2020-12-15: qty 100

## 2020-12-15 MED ORDER — ALTEPLASE 2 MG IJ SOLR: 2.0000 mg | Freq: Once | INTRAMUSCULAR | Status: DC | PRN

## 2020-12-15 NOTE — Progress Notes (Signed)
Nutrition Follow Up  DOCUMENTATION CODES:   Not applicable  INTERVENTION:   Liberalize diet to 2 g sodium as intake remains inadequate   Add Ensure Enlive po BID, each supplement provides 350 kcal and 20 grams of protein Continue ProSource Plus 30 ml BID, each supplement provides 100 kcals and 15 grams protein.  Continue Renal MVI  NUTRITION DIAGNOSIS:   Increased nutrient needs related to acute illness as evidenced by estimated needs.  Ongoing   GOAL:   Patient will meet greater than or equal to 90% of their needs  Declined   MONITOR:   PO intake, Supplement acceptance, Weight trends, I & O's, Labs  REASON FOR ASSESSMENT:   Rounds    ASSESSMENT:   Patient with PMH significant for cardiomyopathy, HLD, HTN, pulmonary HTN, COPD, CKD IV, and gout. Presents this admission with CHF exacerbation.  8/01- afib RVR, cardiogenic shock, had urgent DCCV 8/04- s/p TEE, DCCV 8/08- start CRRT 8/13- stop CRRT 8/16- tolerated iHD 8/19- s/p DCCV  Pt discussed during ICU rounds and with RN.   Requiring daily iHD due to volume overload. Unable to undergo HD yesterday due to staffing issues. Plan to change back to CRRT today to achieve euvolemia.   Appetite has declined due to nausea and fatigue. No meal completions documented since 8/17. Patient reports she is finishing around 25-50% each meal and taking Prosource BID. RD provided Nepro shake and Ensure for patient to try as she now needs complete supplement given poor intake. Patient likes the taste of Ensure. RD to order.   Patient inquiring about diet restrictions with new start HD. Given patient is not eating adequately and transitioning back to HD, do not feel it is appropriate to apply restrictions. Once patient is stabilized on HD x3 weekly and intake increases, will attempt to provide diet education.   Admission weight: 123.3 kg  Current weight: 120.8 kg   Today HD net UF: 4000 ml   Drips: doubutamine Medications:  calcitriol, aranesp, SS novolog, prednisone, senokot  Labs: na 130 (L) Phosphorus 5.4 (wdl for HD) CBG 93-134  Diet Order:   Diet Order             Diet 2 gram sodium Room service appropriate? Yes; Fluid consistency: Thin; Fluid restriction: 1800 mL Fluid  Diet effective now                   EDUCATION NEEDS:   Education needs have been addressed  Skin:  Skin Assessment: Reviewed RN Assessment  Last BM:  8/23  Height:   Ht Readings from Last 1 Encounters:  12/14/20 5' 3.5" (1.613 m)    Weight:   Wt Readings from Last 1 Encounters:  12/15/20 120.8 kg    BMI:  Body mass index is 46.44 kg/m.  Estimated Nutritional Needs:   Kcal:  1800-2000 kcal  Protein:  95-115 grams  Fluid:  1.8 L fluid restriction   Liam Cammarata MS, RD, LDN, CNSC Clinical Nutrition Pager listed in [place]

## 2020-12-15 NOTE — Progress Notes (Signed)
Pt already wearing home CPAP when RT came by during rounds. Advised pt to notify for RT if any assistance is needed with machine. RT will continue to monitor.

## 2020-12-15 NOTE — Progress Notes (Signed)
Patient ID: Caitlyn Fuentes, female   DOB: 1955-12-14, 65 y.o.   MRN: [id number]     Advanced Heart Failure Rounding Note  PCP-Cardiologist: Shelva Majestic, MD  HF clinic: Dr. Haroldine Laws  Subjective:    Events: - 8/1 A fib RVR & A/C systolic heart faiure --> cardiogenic shock. Had urgent DC-CV with brief conversion to NSR but went back in A fib. . On Norepi + milrinone. AKI. Nephrology consulted.  - 8/2 A fib RVR, amio gtt. Continued on milrinone 0.375 mcg + norepi 2 mcg. Started on prednisone 40 mg daily x3 days for gout flare.  - 8/3 did not tolerate milrinone wean w/ drop in Co-ox 68%--->48% and decrease in UOP. Milrinone increased back to 0.375.  Lasix gtt increased to 30/hr.  - 8/4 NE added back given persistently low Co-ox - 8/4 s/p TEE/DCCV>>NSR  - 8/5 back in Afib w/ RVR  -  08/09 HD cath placed, CRRT started - 8/10: conversion to sinus tachycardia  - 8/13 CRRT stopped - 8/15 recurrent AF - 8/16 tolerated iHD - 8/18 tolerated iHD  - 8/19 DCCV to NSR - 8/22 DBA increased to 5   Did not undergo iHD yesterday due to staffing.  CVP today is 19-20.  Na slightly worse (130 from 131)   Remains on DBA 5. Co-ox improved to 63.3% from 50%.   Remains in NSR on amio gtt. HR 80s-90s.  She is lying in bed in no acute distress with primary RN and dialysis RN at bedside.  She c/o nausea unrelieved by recent dose of Zofran.  Asterixis persists.   Objective:   Weight Range: 124.9 kg Body mass index is 48.01 kg/m.   Vital Signs:   Temp:  [97.8 F (36.6 C)-98.4 F (36.9 C)] 97.9 F (36.6 C) (08/24 1950) Pulse Rate:  [77-103] 88 (08/25 0700) Resp:  [15-18] 15 (08/25 0415) BP: (68-125)/(47-107) 125/107 (08/25 0700) SpO2:  [93 %-100 %] 100 % (08/25 0700) Weight:  [124.9 kg] 124.9 kg (08/25 0500) Last BM Date: 12/13/20  Weight change: Filed Weights   12/13/20 2015 12/14/20 0434 12/15/20 0500  Weight: 115.6 kg 122.1 kg 124.9 kg    Intake/Output:   Intake/Output Summary (Last  24 hours) at 12/15/2020 0736 Last data filed at 12/15/2020 0600 Gross per 24 hour  Intake 867.32 ml  Output --  Net 867.32 ml      Physical Exam   CVP 19-20  General:  Well appearing, obese, lying in bed. No respiratory difficulty HEENT: normal Neck: supple. JVD elevated to ear, + Rt IJ HD cath and Lt IJ CVC.  Carotids 2+ bilat; no bruits. No lymphadenopathy or thyromegaly appreciated. Cor: PMI nondisplaced. Regular rate & rhythm. No rubs, gallops or murmurs. Lungs: clear Abdomen: obese, soft, nontender, nondistended. No hepatosplenomegaly. No bruits or masses. Good bowel sounds. Extremities: no cyanosis, clubbing, rash, 2-3+ bilateral LEE up to thighs edema +unna boots  Neuro: alert & oriented x 3, cranial nerves grossly intact. moves all 4 extremities w/o difficulty. Affect pleasant.   Telemetry   NSR 80s to 90s/ Personally reviewed  Labs    CBC Recent Labs    12/14/20 0317 12/15/20 0426  WBC 6.9 7.6  HGB 8.5* 9.0*  HCT 27.2* 28.9*  MCV 105.8* 105.5*  PLT 277 378    Basic Metabolic Panel Recent Labs    12/14/20 0317 12/15/20 0426  NA 131* 130*  K 3.9 4.1  CL 93* 89*  CO2 26 24  GLUCOSE 113* 104*  BUN 21  39*  CREATININE 4.51* 6.26*  CALCIUM 9.3 9.8  MG 2.1 2.1  PHOS 3.8 5.4*    Liver Function Tests Recent Labs    12/14/20 0317 12/15/20 0426  ALBUMIN 2.7* 2.9*     No results for input(s): LIPASE, AMYLASE in the last 72 hours. Cardiac Enzymes No results for input(s): CKTOTAL, CKMB, CKMBINDEX, TROPONINI in the last 72 hours.  BNP: BNP (last 3 results) Recent Labs    11/20/20 0318 11/21/20 0258  BNP 2,051.3* 2,022.4*     ProBNP (last 3 results) No results for input(s): PROBNP in the last 8760 hours.   D-Dimer No results for input(s): DDIMER in the last 72 hours. Hemoglobin A1C No results for input(s): HGBA1C in the last 72 hours.  Fasting Lipid Panel No results for input(s): CHOL, HDL, LDLCALC, TRIG, CHOLHDL, LDLDIRECT in the last  72 hours. Thyroid Function Tests No results for input(s): TSH, T4TOTAL, T3FREE, THYROIDAB in the last 72 hours.  Invalid input(s): FREET3   Other results:   Imaging    No results found.   Medications:     Scheduled Medications:  (feeding supplement) PROSource Plus  30 mL Oral BID BM   calcitRIOL  0.25 mcg Oral Daily   Chlorhexidine Gluconate Cloth  6 each Topical Daily   Chlorhexidine Gluconate Cloth  6 each Topical Q0600   clotrimazole  1 application Topical BID   colchicine  0.3 mg Oral Daily   darbepoetin (ARANESP) injection - DIALYSIS  60 mcg Intravenous Q Tue-HD   estradiol  1 mg Oral Daily   febuxostat  40 mg Oral q AM   ferrous sulfate  325 mg Oral q morning   fluticasone  2 spray Each Nare Daily   fluticasone furoate-vilanterol  1 puff Inhalation Daily   gabapentin  100 mg Oral Daily   insulin aspart  0-6 Units Subcutaneous TID WC   levothyroxine  88 mcg Oral q morning   lidocaine  1 patch Transdermal Q24H   midodrine  15 mg Oral TID WC   multivitamin  1 tablet Oral QHS   predniSONE  5 mg Oral Q breakfast   senna-docusate  2 tablet Oral BID   sodium chloride flush  10-40 mL Intracatheter Q12H   sodium chloride flush  3 mL Intravenous Q12H    Infusions:  sodium chloride     sodium chloride     sodium chloride     sodium chloride     sodium chloride     sodium chloride     sodium chloride     albumin human     amiodarone 30 mg/hr (12/15/20 0600)   anticoagulant sodium citrate     DOBUTamine 5 mcg/kg/min (12/15/20 0616)   heparin 1,550 Units/hr (12/15/20 0600)   norepinephrine (LEVOPHED) Adult infusion Stopped (12/13/20 0952)    PRN Medications: sodium chloride, sodium chloride, sodium chloride, sodium chloride, sodium chloride, sodium chloride, sodium chloride, acetaminophen, albuterol, alteplase, alteplase, alteplase, anticoagulant sodium citrate, calcium carbonate, cyclobenzaprine, heparin, HYDROcodone-acetaminophen, hydrocortisone cream, lidocaine  (PF), lidocaine (PF), lidocaine-prilocaine, lidocaine-prilocaine, ondansetron (ZOFRAN) IV, oxyCODONE, pentafluoroprop-tetrafluoroeth, pentafluoroprop-tetrafluoroeth, sodium chloride flush, sodium chloride flush    Patient Profile   Caitlyn Fuentes is a 65 year old with a history of chronic systolic hf previously EF 45%,  NICM, hypothyroid, PAF, gout, OSA, CKD Stage IV, HTN, hyperlipidemia, and lupus.   Admitted with A fib RVR and A/C systolic heart failure complicated by cardiogenic shock.     Assessment/Plan   1. Acute/Chronic Systolic Heart Failure -> Cardiogenic  shock in setting AF with RVR - 8/1 Lactic acid 1.5. CO-OX 43%.--> started on milrinone and NE - Echo 7/22 EF 30-35% (2021)  This admit -> 25-30%. - NE stopped 8/12, then resumed briefly the morning of 8/15 -- later weaned off.  - CVVHD stopped 8/13. Tolerating iHD on dobutamine, but remains fluid overloaded.   - on DBA 5, co-ox improved to 63.3  - Unable to tolerate GDMT due to hypotension and AKI - Not candidate for advanced therapies due to size and ESRD. If unable to tolerate iHD off pressors/ fails inotrope wean, she would require hospice evaluation - Continue midodrine 15 mg TID  - Palliative care now following  - Needs aggressive HD this week. Will try to dialyze daily. Appreciate nephrology's assistance    2. PAF--> Afib RVR - Developed shock and had urgent cardioversion 8/1 with conversion to NSR w/ ERAF - Repeat DCCV 8/4 w/ ERAF - Conversion to sinus tachycardia on 8/10 with IV amio - Afib again starting 8/17, DCCV on 8/19.  - unfortunately, not a candidate for ablation due to size if AF were to recur. Not candidate for AVN ablation and CRT. - plts steadily decreased, so she switched to bival due to concern for HIT; however, SRA negative, so she switched back to heparin.  Platelets normalized -- 323 today  - continue amiodarone gtt 30 mg/hr while on dobutamine.    3. AKI on CKD Stage IV -> ESRD - Baseline SCr ~3.2,  AKI on admission to 7.33 - Renal US with no acute findings - Off CRRT on 8/13. No response to high-dose lasix. Now on iHD but struggling to keep fluid down, even after multiple sessions of dialysis  - She remains very volume overloaded. Needs aggressive HD this week. Will try to dialyze daily. Unable to dialyze on 8/24 due to staffing  - Continue ICU care due to tenuous hemodynamic status while on iHD    4. OSA - Uses CPAP, but now refusing    5. Hypothyroidism -TSH 8. T3 ok and T4 2.3  - Continue Synthroid at 88 mcg  - Repeat TSH in 6 weeks.    6. Gout - Uric acid of 10. Pain RLE that was a questionable flare.  Now resolved  - On uloric 40 mg daily  - Completed 3 day course of prednisone    7. Hypokalemia -- now resolved -Stable.   8. Deconditioning - Continue PT/OT--> CIR following.  - Mobilize today  9 Lupus - on low-dose prednisone.  10. Thrombocytopenia - Plt count of 121 -> 104 -- >62 switched from heparin drip to bival, but negative for HIT -- heparin resumed  - Plt ct now improved -- is 323 today  - No active bleeding noted   11. Tremor/ Asterixis  - suspect due to uremia. Improving w/ HD  -  NH3 ok at 20   12. Hypervolemic Hyponatremia - improving w/ HD - Na 127>>131>>130   Darcella Gasman, NP  7:36 AM 12/15/2020   Agree with above.   Remains on DBA 5 co-ox 63%. Unable to get iHD yesterday due to staffing issues. CVP 19-20. Denies SOB, orthopnea or PND. + tremor  General:  Sitting in bed No resp difficulty HEENT: normal Neck: supple.RIJ HD cath carotids 2+ bilat; no bruits. No lymphadenopathy or thryomegaly appreciated. Cor: PMI nondisplaced. Regular rate & rhythm. 2/6 TR Lungs: clear Abdomen: obese soft, nontender, nondistended. No hepatosplenomegaly. No bruits or masses. Good bowel sounds. Extremities: no cyanosis, clubbing, rash,  2-3+ edema +UNNA Neuro: alert & orientedx3, cranial nerves grossly intact. moves all 4 extremities w/o difficulty. Affect  pleasant + tremor/asterixis  Co-ox improved on DBA but still massively volume overloaded. Weight up again. I spoke with Renal team. Given staffing issues we are unable to dialyze her every day in an attempt to get her back to baseline and give iHD a fair chance. Will restart CVVHD to get CVP < 10. Once fluid off will restart iHD. If unable to manage her volume with iHD without inotropes then would be Hospice candidate.   CRITICAL CARE Performed by: Glori Bickers  Total critical care time: 35 minutes  Critical care time was exclusive of separately billable procedures and treating other patients.  Critical care was necessary to treat or prevent imminent or life-threatening deterioration.  Critical care was time spent personally by me (independent of midlevel providers or residents) on the following activities: development of treatment plan with patient and/or surrogate as well as nursing, discussions with consultants, evaluation of patient's response to treatment, examination of patient, obtaining history from patient or surrogate, ordering and performing treatments and interventions, ordering and review of laboratory studies, ordering and review of radiographic studies, pulse oximetry and re-evaluation of patient's condition.  Glori Bickers, MD  9:29 AM

## 2020-12-15 NOTE — Progress Notes (Signed)
OT Cancellation Note  Patient Details Name: Emylie Amster MRN: [id number] DOB: [birth year]   Cancelled Treatment:    Reason Eval/Treat Not Completed: Fatigue/lethargy limiting ability to participate--pt just finished dialysis and too fatigued for OOB ADLs at this time.  Will follow and see as able.   Jolaine Artist, OT Acute Rehabilitation Services Pager [phone] Office [phone]   Delight Stare 12/15/2020, 12:52 PM

## 2020-12-15 NOTE — Progress Notes (Signed)
ANTICOAGULATION CONSULT NOTE - Follow-Up Consult  Pharmacy Consult for bivalirudin>>heparin Indication: atrial fibrillation  Patient Measurements: Height: 5' 3.5" (161.3 cm) Weight: 122.1 kg (269 lb 2.9 oz) IBW/kg (Calculated) : 53.55 Heparin Dosing Weight: 85 kg  Vital Signs: Temp: 97.9 F (36.6 C) (08/24 1950) Temp Source: Oral (08/24 1950) BP: 95/73 (08/25 0100) Pulse Rate: 88 (08/25 0100)  Labs: Recent Labs    12/12/20 0522 12/13/20 0336 12/14/20 0317 12/14/20 1901 12/15/20 0426  HGB 9.7* 8.8* 8.5*  --  9.0*  HCT 31.3* 28.7* 27.2*  --  28.9*  PLT 259 297 277  --  323  APTT 58* 70* 80*  --   --   HEPARINUNFRC  --   --   --  0.14* 0.22*  CREATININE 4.32* 6.36* 4.51*  --  6.26*     Estimated Creatinine Clearance: 11.6 mL/min (A) (by C-G formula based on SCr of 6.26 mg/dL (H)).   Assessment: 65 y.o. female with  h/o Afib, DOAC on hold, for heparin.  Pt was on Xarelto PTA and changed to Eliquis due to procedures.  Eliquis 5 mg last given at 2317 7/31.   Hit antibody negative (0.087). 8/17 Discussed with primary team given significant decline in platelet count will go ahead and change over to bival - SRA negative 8/17.   Discussed with team, will change from bivalirudin to heparin infusion given HIT negative and plt recovered. Was last therapeutic on heparin infusion at 1400-1450 units/hr before changing to bivalirudin.   8/25 AM update:  Heparin level low but trending up Hgb low but stable  Goal of Therapy:  Heparin infusion: 0.3-0.7 units/mL Monitor platelets by anticoagulation protocol: Yes   Plan:  Increase heparin infusion to 1550 units/hr Check 8 hr heparin level Monitor HL, CBC, and for s/sx of bleeding   Narda Bonds, PharmD, BCPS Clinical Pharmacist Phone: [phone]

## 2020-12-15 NOTE — Progress Notes (Signed)
Physical Therapy Treatment Patient Details Name: Caitlyn Fuentes MRN: [id number] DOB: October 25, 1955 Today's Date: 12/15/2020    History of Present Illness Pt is a 65 y/o female presenting on 11/20/20 with worsening palpitations, SOB and LE edema. Pt found in rapid a fib, chest x-ray with significant vascular congestion. 8/1 hypotensive with cardiogenic shock, transferred to ICU and urgent cardioversion.   S/P TEE and cardioversion 8/4. CRRT 8/8-8/13. DCCV with NSR 8/19. Back on CRRT 8/25.  PMHx: afib, cardiomyopathy, HTN, lupus, PAF, CKD.    PT Comments    Pt just off on HD, cold with bear hugger blanket, CRRT imminent.  Pt agreed to do some exercise in bed, but all involved agreed pt was not up to OOB mobility.  Completed AA/A/graded resistance exercise to bil LE's and bil UE's.  Listened intentionally to pt's concerns and gave encouragement where appropriate.   Follow Up Recommendations  CIR;Supervision/Assistance - 24 hour     Equipment Recommendations  Rolling walker with 5" wheels;Other (comment) (TBA)    Recommendations for Other Services       Precautions / Restrictions Precautions Precautions: Fall    Mobility  Bed Mobility               General bed mobility comments: limited today to exercise    Transfers                    Ambulation/Gait                 Stairs             Wheelchair Mobility    Modified Rankin (Stroke Patients Only)       Balance                                            Cognition Arousal/Alertness: Awake/alert Behavior During Therapy: WFL for tasks assessed/performed Overall Cognitive Status: Within Functional Limits for tasks assessed                                        Exercises Other Exercises Other Exercises: bil hip/knee flexion/ext AA x10, A/graded resistance x1 Other Exercises: hip ab/add bil x 10 reps Other Exercises: SLR bil x 10 reps Other Exercises:  bicep/tricep pressess with graded resistance x 10 reps bil.    General Comments General comments (skin integrity, edema, etc.): BP generally around 95/low 70'w,  HR in the upper 90's,  Sats in mid 90's      Pertinent Vitals/Pain Pain Assessment: Faces Faces Pain Scale: Hurts a little bit Pain Location: neck suture site Pain Intervention(s): Monitored during session    Home Living                      Prior Function            PT Goals (current goals can now be found in the care plan section) Acute Rehab PT Goals Patient Stated Goal: to be able to go home PT Goal Formulation: With patient Time For Goal Achievement: 12/26/20 Potential to Achieve Goals: Fair Progress towards PT goals: Progressing toward goals    Frequency    Min 3X/week      PT Plan Current plan remains appropriate    Co-evaluation  AM-PAC PT "6 Clicks" Mobility   Outcome Measure  Help needed turning from your back to your side while in a flat bed without using bedrails?: A Little Help needed moving from lying on your back to sitting on the side of a flat bed without using bedrails?: A Little Help needed moving to and from a bed to a chair (including a wheelchair)?: A Little Help needed standing up from a chair using your arms (e.g., wheelchair or bedside chair)?: A Little Help needed to walk in hospital room?: A Lot Help needed climbing 3-5 steps with a railing? : A Lot 6 Click Score: 16    End of Session   Activity Tolerance: Patient limited by fatigue Patient left: in bed;with call bell/phone within reach;Other (comment) (getting ready to imminently go back on CRRT) Nurse Communication: Mobility status PT Visit Diagnosis: Other abnormalities of gait and mobility (R26.89);Muscle weakness (generalized) (M62.81);Difficulty in walking, not elsewhere classified (R26.2)     Time: 1540-0867 PT Time Calculation (min) (ACUTE ONLY): 36 min  Charges:  $Therapeutic Exercise:  8-22 mins $Therapeutic Activity: 23-37 mins                     12/15/2020  Caitlyn Carne., PT Acute Rehabilitation Services [phone]  (pager) [phone]  (office)   Caitlyn Fuentes 12/15/2020, 4:24 PM

## 2020-12-15 NOTE — Progress Notes (Signed)
Round on patient today per HD nurse referral secondary to patient request of information about HD and fluid overload. Patient was found lying in the bed and agreeable to conversation. Patient educated about the process of HD, hyperkalemia and hyperphosphatemia. Patient also educated of the necessity of CRRT at this time secondary to her fluid overload. Patient was given information on how to appropriately measure and count fluid intake. The patient also requested to review her current lab values and verbalizes understanding. Will continue to follow patient at this time. Handouts and contact information provided to patient should additional questions arise.  Dorthey Sawyer, RN  Dialysis Nurse Coordinator [phone]

## 2020-12-15 NOTE — Procedures (Addendum)
I was present at this dialysis session. I have reviewed the session itself and made appropriate changes.   Seen on HD, toelrating, BPs soft.  UF goal 4L.  3.5h, TDC. 3K bath.  Using IVB albumin, remains on Dobut. Drop T to 36, maybe even 35.5  3K bath. B/DFR 300/500.    UPDATE: After discussion with [facility] plan to chang eto CRRT in attempt to achieve euvolemia.  Will start today. All 4K, UF 100 to 29mL/h neg, No heparin (systemic+)  Filed Weights   12/13/20 2015 12/14/20 0434 12/15/20 0500  Weight: 115.6 kg 122.1 kg 124.9 kg    Recent Labs  Lab 12/15/20 0426  NA 130*  K 4.1  CL 89*  CO2 24  GLUCOSE 104*  BUN 39*  CREATININE 6.26*  CALCIUM 9.8  PHOS 5.4*    Recent Labs  Lab 12/13/20 0336 12/14/20 0317 12/15/20 0426  WBC 8.7 6.9 7.6  HGB 8.8* 8.5* 9.0*  HCT 28.7* 27.2* 28.9*  MCV 107.9* 105.8* 105.5*  PLT 297 277 323    Scheduled Meds:  (feeding supplement) PROSource Plus  30 mL Oral BID BM   calcitRIOL  0.25 mcg Oral Daily   Chlorhexidine Gluconate Cloth  6 each Topical Daily   Chlorhexidine Gluconate Cloth  6 each Topical Q0600   clotrimazole  1 application Topical BID   colchicine  0.3 mg Oral Daily   darbepoetin (ARANESP) injection - DIALYSIS  60 mcg Intravenous Q Tue-HD   estradiol  1 mg Oral Daily   febuxostat  40 mg Oral q AM   ferrous sulfate  325 mg Oral q morning   fluticasone  2 spray Each Nare Daily   fluticasone furoate-vilanterol  1 puff Inhalation Daily   gabapentin  100 mg Oral Daily   insulin aspart  0-6 Units Subcutaneous TID WC   levothyroxine  88 mcg Oral q morning   lidocaine  1 patch Transdermal Q24H   midodrine  15 mg Oral TID WC   multivitamin  1 tablet Oral QHS   predniSONE  5 mg Oral Q breakfast   senna-docusate  2 tablet Oral BID   sodium chloride flush  10-40 mL Intracatheter Q12H   sodium chloride flush  3 mL Intravenous Q12H   Continuous Infusions:  sodium chloride     sodium chloride     sodium chloride     sodium chloride      sodium chloride     sodium chloride     sodium chloride     albumin human     amiodarone 30 mg/hr (12/15/20 0800)   anticoagulant sodium citrate     DOBUTamine 5 mcg/kg/min (12/15/20 0800)   heparin 1,550 Units/hr (12/15/20 0800)   norepinephrine (LEVOPHED) Adult infusion Stopped (12/13/20 0952)   PRN Meds:.sodium chloride, sodium chloride, sodium chloride, sodium chloride, sodium chloride, sodium chloride, sodium chloride, acetaminophen, albuterol, alteplase, alteplase, alteplase, anticoagulant sodium citrate, calcium carbonate, cyclobenzaprine, heparin, HYDROcodone-acetaminophen, hydrocortisone cream, lidocaine (PF), lidocaine (PF), lidocaine-prilocaine, lidocaine-prilocaine, ondansetron (ZOFRAN) IV, oxyCODONE, pentafluoroprop-tetrafluoroeth, pentafluoroprop-tetrafluoroeth, prochlorperazine, sodium chloride flush, sodium chloride flush   Pearson Grippe  MD 12/15/2020, 8:59 AM

## 2020-12-16 ENCOUNTER — Inpatient Hospital Stay (HOSPITAL_COMMUNITY): Payer: Medicare Other

## 2020-12-16 DIAGNOSIS — R570 Cardiogenic shock: Secondary | ICD-10-CM | POA: Diagnosis not present

## 2020-12-16 DIAGNOSIS — N179 Acute kidney failure, unspecified: Secondary | ICD-10-CM | POA: Diagnosis not present

## 2020-12-16 DIAGNOSIS — N170 Acute kidney failure with tubular necrosis: Secondary | ICD-10-CM | POA: Diagnosis not present

## 2020-12-16 DIAGNOSIS — I4891 Unspecified atrial fibrillation: Secondary | ICD-10-CM | POA: Diagnosis not present

## 2020-12-16 DIAGNOSIS — Z515 Encounter for palliative care: Secondary | ICD-10-CM | POA: Diagnosis not present

## 2020-12-16 DIAGNOSIS — I5023 Acute on chronic systolic (congestive) heart failure: Secondary | ICD-10-CM | POA: Diagnosis not present

## 2020-12-16 LAB — POCT I-STAT, CHEM 8
BUN: 17 mg/dL (ref 8–23)
BUN: 15 mg/dL (ref 8–23)
BUN: 13 mg/dL (ref 8–23)
BUN: 18 mg/dL (ref 8–23)
Calcium, Ion: 0.93 mmol/L — ABNORMAL LOW (ref 1.15–1.40)
Calcium, Ion: 1.12 mmol/L — ABNORMAL LOW (ref 1.15–1.40)
Calcium, Ion: 0.45 mmol/L — CL (ref 1.15–1.40)
Calcium, Ion: 0.81 mmol/L — CL (ref 1.15–1.40)
Chloride: 93 mmol/L — ABNORMAL LOW (ref 98–111)
Chloride: 93 mmol/L — ABNORMAL LOW (ref 98–111)
Chloride: 83 mmol/L — ABNORMAL LOW (ref 98–111)
Chloride: 91 mmol/L — ABNORMAL LOW (ref 98–111)
Creatinine, Ser: 2.6 mg/dL — ABNORMAL HIGH (ref 0.44–1.00)
Creatinine, Ser: 3.1 mg/dL — ABNORMAL HIGH (ref 0.44–1.00)
Creatinine, Ser: 2.4 mg/dL — ABNORMAL HIGH (ref 0.44–1.00)
Creatinine, Ser: 2.9 mg/dL — ABNORMAL HIGH (ref 0.44–1.00)
Glucose, Bld: 140 mg/dL — ABNORMAL HIGH (ref 70–99)
Glucose, Bld: 87 mg/dL (ref 70–99)
Glucose, Bld: 157 mg/dL — ABNORMAL HIGH (ref 70–99)
Glucose, Bld: 318 mg/dL — ABNORMAL HIGH (ref 70–99)
HCT: 28 % — ABNORMAL LOW (ref 36.0–46.0)
HCT: 28 % — ABNORMAL LOW (ref 36.0–46.0)
HCT: 29 % — ABNORMAL LOW (ref 36.0–46.0)
HCT: 33 % — ABNORMAL LOW (ref 36.0–46.0)
Hemoglobin: 9.5 g/dL — ABNORMAL LOW (ref 12.0–15.0)
Hemoglobin: 9.5 g/dL — ABNORMAL LOW (ref 12.0–15.0)
Hemoglobin: 11.2 g/dL — ABNORMAL LOW (ref 12.0–15.0)
Hemoglobin: 9.9 g/dL — ABNORMAL LOW (ref 12.0–15.0)
Potassium: 3.7 mmol/L (ref 3.5–5.1)
Potassium: 4 mmol/L (ref 3.5–5.1)
Potassium: 3.6 mmol/L (ref 3.5–5.1)
Potassium: 3.7 mmol/L (ref 3.5–5.1)
Sodium: 134 mmol/L — ABNORMAL LOW (ref 135–145)
Sodium: 133 mmol/L — ABNORMAL LOW (ref 135–145)
Sodium: 130 mmol/L — ABNORMAL LOW (ref 135–145)
Sodium: 136 mmol/L (ref 135–145)
TCO2: 27 mmol/L (ref 22–32)
TCO2: 27 mmol/L (ref 22–32)
TCO2: 26 mmol/L (ref 22–32)
TCO2: 30 mmol/L (ref 22–32)

## 2020-12-16 LAB — CBC
HCT: 27.7 % — ABNORMAL LOW (ref 36.0–46.0)
Hemoglobin: 8.7 g/dL — ABNORMAL LOW (ref 12.0–15.0)
MCH: 32.2 pg (ref 26.0–34.0)
MCHC: 31.4 g/dL (ref 30.0–36.0)
MCV: 102.6 fL — ABNORMAL HIGH (ref 80.0–100.0)
Platelets: 266 10*3/uL (ref 150–400)
RBC: 2.7 MIL/uL — ABNORMAL LOW (ref 3.87–5.11)
RDW: 16 % — ABNORMAL HIGH (ref 11.5–15.5)
WBC: 8.6 10*3/uL (ref 4.0–10.5)
nRBC: 0.7 % — ABNORMAL HIGH (ref 0.0–0.2)

## 2020-12-16 LAB — POCT ACTIVATED CLOTTING TIME: Activated Clotting Time: 144 seconds

## 2020-12-16 LAB — BASIC METABOLIC PANEL
Anion gap: 15 (ref 5–15)
BUN: 15 mg/dL (ref 8–23)
CO2: 25 mmol/L (ref 22–32)
Calcium: 8.9 mg/dL (ref 8.9–10.3)
Chloride: 93 mmol/L — ABNORMAL LOW (ref 98–111)
Creatinine, Ser: 2.79 mg/dL — ABNORMAL HIGH (ref 0.44–1.00)
GFR, Estimated: 18 mL/min — ABNORMAL LOW (ref >60)
Glucose, Bld: 196 mg/dL — ABNORMAL HIGH (ref 70–99)
Potassium: 4.5 mmol/L (ref 3.5–5.1)
Sodium: 133 mmol/L — ABNORMAL LOW (ref 135–145)

## 2020-12-16 LAB — RENAL FUNCTION PANEL
Albumin: 3 g/dL — ABNORMAL LOW (ref 3.5–5.0)
Anion gap: 13 (ref 5–15)
BUN: 13 mg/dL (ref 8–23)
CO2: 25 mmol/L (ref 22–32)
Calcium: 9.2 mg/dL (ref 8.9–10.3)
Chloride: 96 mmol/L — ABNORMAL LOW (ref 98–111)
Creatinine, Ser: 3.07 mg/dL — ABNORMAL HIGH (ref 0.44–1.00)
GFR, Estimated: 16 mL/min — ABNORMAL LOW (ref >60)
Glucose, Bld: 117 mg/dL — ABNORMAL HIGH (ref 70–99)
Phosphorus: 1.5 mg/dL — ABNORMAL LOW (ref 2.5–4.6)
Potassium: 4.1 mmol/L (ref 3.5–5.1)
Sodium: 134 mmol/L — ABNORMAL LOW (ref 135–145)

## 2020-12-16 LAB — GLUCOSE, CAPILLARY
Glucose-Capillary: 99 mg/dL (ref 70–99)
Glucose-Capillary: 127 mg/dL — ABNORMAL HIGH (ref 70–99)
Glucose-Capillary: 94 mg/dL (ref 70–99)
Glucose-Capillary: 91 mg/dL (ref 70–99)

## 2020-12-16 LAB — COOXEMETRY PANEL
Carboxyhemoglobin: 2.2 % — ABNORMAL HIGH (ref 0.5–1.5)
Methemoglobin: 0.7 % (ref 0.0–1.5)
O2 Saturation: 70.7 %
Total hemoglobin: 9 g/dL — ABNORMAL LOW (ref 12.0–16.0)

## 2020-12-16 LAB — HEPARIN LEVEL (UNFRACTIONATED): Heparin Unfractionated: 0.35 IU/mL (ref 0.30–0.70)

## 2020-12-16 LAB — MAGNESIUM: Magnesium: 2.2 mg/dL (ref 1.7–2.4)

## 2020-12-16 MED ORDER — ACD FORMULA A 0.73-2.45-2.2 GM/100ML VI SOLN
3000.0000 mL | Status: DC
  Administered 2020-12-16: 3000 mL via INTRAVENOUS_CENTRAL
  Filled 2020-12-16 (×3): qty 3000

## 2020-12-16 MED ORDER — "THROMBI-PAD 3""X3"" EX PADS"
1.0000 | MEDICATED_PAD | Freq: Once | CUTANEOUS | Status: AC
  Administered 2020-12-17: 1 via TOPICAL
  Filled 2020-12-16: qty 1

## 2020-12-16 MED ORDER — DEXTROSE 5 % IV SOLN
20.0000 g | INTRAVENOUS | Status: DC
  Administered 2020-12-16: 20 g via INTRAVENOUS_CENTRAL
  Filled 2020-12-16 (×3): qty 200

## 2020-12-16 MED ORDER — SODIUM PHOSPHATES 45 MMOLE/15ML IV SOLN
30.0000 mmol | Freq: Once | INTRAVENOUS | Status: AC
  Administered 2020-12-16: 30 mmol via INTRAVENOUS
  Filled 2020-12-16: qty 10

## 2020-12-16 MED ORDER — "THROMBI-PAD 3""X3"" EX PADS"
1.0000 | MEDICATED_PAD | Freq: Once | CUTANEOUS | Status: AC
  Administered 2020-12-16: 1 via TOPICAL
  Filled 2020-12-16: qty 1

## 2020-12-16 MED ORDER — LIDOCAINE HCL (PF) 1 % IJ SOLN
INTRAMUSCULAR | Status: AC
  Filled 2020-12-16: qty 5

## 2020-12-16 NOTE — Progress Notes (Signed)
Orthopedic Tech Progress Note Patient Details:  Caitlyn Fuentes 12-31-1955 [id number]  Ortho Devices Type of Ortho Device: Haematologist Ortho Device/Splint Location: BIL Ortho Device/Splint Interventions: Ordered, Application   Post Interventions Patient Tolerated: Well Instructions Provided: [facility] [place] 12/16/2020, 4:28 PM

## 2020-12-16 NOTE — Progress Notes (Signed)
Patient ID: Caitlyn Fuentes, female   DOB: Oct 27, 1955, 65 y.o.   MRN: [id number]     Advanced Heart Failure Rounding Note  PCP-Cardiologist: Shelva Majestic, MD  HF clinic: Dr. Haroldine Laws  Subjective:    Events: - 8/1 A fib RVR & A/C systolic heart faiure --> cardiogenic shock. Had urgent DC-CV with brief conversion to NSR but went back in A fib. . On Norepi + milrinone. AKI. Nephrology consulted.  - 8/2 A fib RVR, amio gtt. Continued on milrinone 0.375 mcg + norepi 2 mcg. Started on prednisone 40 mg daily x3 days for gout flare.  - 8/3 did not tolerate milrinone wean w/ drop in Co-ox 68%--->48% and decrease in UOP. Milrinone increased back to 0.375.  Lasix gtt increased to 30/hr.  - 8/4 NE added back given persistently low Co-ox - 8/4 s/p TEE/DCCV>>NSR  - 8/5 back in Afib w/ RVR  -  08/09 HD cath placed, CRRT started - 8/10: conversion to sinus tachycardia  - 8/13 CRRT stopped - 8/15 recurrent AF - 8/16 tolerated iHD - 8/18 tolerated iHD  - 8/19 DCCV to NSR - 8/22 DBA increased to 5  - 8/25 Had iHD/ Restarted CVVHD   Restarted on [facility] yesterday due to inability to get CVP down with iHD. Remains on DBA 5. Co-ox 71%. Weight down to 264. (Lowest weight this admit 255)  Denies CP, SOB, orthopnea or PND. CVP 19 -> 16.   Remains on IV amio and heparin. Went back into AF around 1130 last night. No bleeding  Objective:   Weight Range: 119.9 kg Body mass index is 46.09 kg/m.   Vital Signs:   Temp:  [98 F (36.7 C)-99 F (37.2 C)] 99 F (37.2 C) (08/26 0400) Pulse Rate:  [60-141] 131 (08/26 0700) Resp:  [17-22] 22 (08/25 1139) BP: (80-164)/(34-147) 90/65 (08/26 0700) SpO2:  [86 %-100 %] 100 % (08/26 0700) Weight:  [119.9 kg-124.9 kg] 119.9 kg (08/26 0600) Last BM Date: 12/13/20  Weight change: Filed Weights   12/15/20 1145 12/15/20 1530 12/16/20 0600  Weight: 120.8 kg 121.6 kg 119.9 kg    Intake/Output:   Intake/Output Summary (Last 24 hours) at 12/16/2020 0720 Last data  filed at 12/16/2020 0600 Gross per 24 hour  Intake 1832.54 ml  Output 6647 ml  Net -4814.46 ml      Physical Exam   General:  Sitting in chair  No resp difficulty HEENT: normal Neck: supple.RIJ HD. Carotids 2+ bilat; no bruits. No lymphadenopathy or thryomegaly appreciated. Cor: PMI nondisplaced. Tachy irregular rate & rhythm. No rubs, gallops or murmurs. Lungs: clear Abdomen: obese soft, nontender, nondistended. No hepatosplenomegaly. No bruits or masses. Good bowel sounds. Extremities: no cyanosis, clubbing, rash, 2-3+ edema + UNNA  Neuro: alert & orientedx3, cranial nerves grossly intact. moves all 4 extremities w/o difficulty. Affect pleasant   Telemetry   NSR 80s to 90s/ Personally reviewed  Labs    CBC Recent Labs    12/15/20 0426 12/16/20 0437  WBC 7.6 8.6  HGB 9.0* 8.7*  HCT 28.9* 27.7*  MCV 105.5* 102.6*  PLT 323 240    Basic Metabolic Panel Recent Labs    12/15/20 0426 12/15/20 2203 12/16/20 0437  NA 130* 132* 134*  K 4.1 4.7 4.1  CL 89* 95* 96*  CO2 24 25 25   GLUCOSE 104* 112* 117*  BUN 39* 18 13  CREATININE 6.26* 3.33* 3.07*  CALCIUM 9.8 8.7* 9.2  MG 2.1  --  2.2  PHOS 5.4* 1.9* 1.5*  Liver Function Tests Recent Labs    12/15/20 2203 12/16/20 0437  ALBUMIN 2.9* 3.0*     No results for input(s): LIPASE, AMYLASE in the last 72 hours. Cardiac Enzymes No results for input(s): CKTOTAL, CKMB, CKMBINDEX, TROPONINI in the last 72 hours.  BNP: BNP (last 3 results) Recent Labs    11/20/20 0318 11/21/20 0258  BNP 2,051.3* 2,022.4*     ProBNP (last 3 results) No results for input(s): PROBNP in the last 8760 hours.   D-Dimer No results for input(s): DDIMER in the last 72 hours. Hemoglobin A1C No results for input(s): HGBA1C in the last 72 hours.  Fasting Lipid Panel No results for input(s): CHOL, HDL, LDLCALC, TRIG, CHOLHDL, LDLDIRECT in the last 72 hours. Thyroid Function Tests No results for input(s): TSH, T4TOTAL, T3FREE,  THYROIDAB in the last 72 hours.  Invalid input(s): FREET3   Other results:   Imaging    DG CHEST PORT 1 VIEW  Result Date: 12/15/2020 CLINICAL DATA:  PICC line placement. EXAM: PORTABLE CHEST 1 VIEW COMPARISON:  Chest x-ray dated November 28, 2020. FINDINGS: PICC line catheter not identified. Unchanged bilateral internal jugular central venous catheters. Stable cardiomegaly and pulmonary vascular congestion. Mildly improved aeration of the left lung base. No pneumothorax or pleural effusion. No acute osseous abnormality. IMPRESSION: 1. PICC line catheter not identified. 2. Mildly improved aeration of the left lung base. Electronically Signed   By: Titus Dubin M.D.   On: 12/15/2020 11:34     Medications:     Scheduled Medications:  (feeding supplement) PROSource Plus  30 mL Oral BID BM   calcitRIOL  0.25 mcg Oral Daily   Chlorhexidine Gluconate Cloth  6 each Topical Daily   Chlorhexidine Gluconate Cloth  6 each Topical Q0600   clotrimazole  1 application Topical BID   colchicine  0.3 mg Oral Daily   darbepoetin (ARANESP) injection - DIALYSIS  60 mcg Intravenous Q Tue-HD   estradiol  1 mg Oral Daily   febuxostat  40 mg Oral q AM   feeding supplement  237 mL Oral BID BM   ferrous sulfate  325 mg Oral q morning   fluticasone  2 spray Each Nare Daily   fluticasone furoate-vilanterol  1 puff Inhalation Daily   gabapentin  100 mg Oral Daily   insulin aspart  0-6 Units Subcutaneous TID WC   levothyroxine  88 mcg Oral q morning   lidocaine  1 patch Transdermal Q24H   midodrine  15 mg Oral TID WC   multivitamin  1 tablet Oral QHS   predniSONE  5 mg Oral Q breakfast   senna-docusate  2 tablet Oral BID   sodium chloride flush  10-40 mL Intracatheter Q12H   sodium chloride flush  3 mL Intravenous Q12H    Infusions:   prismasol BGK 4/2.5 500 mL/hr at 12/16/20 0041    prismasol BGK 4/2.5 200 mL/hr at 12/16/20 0427   sodium chloride     sodium chloride     sodium chloride      sodium chloride     sodium chloride     sodium chloride     sodium chloride     amiodarone 30 mg/hr (12/16/20 0600)   anticoagulant sodium citrate     DOBUTamine 5 mcg/kg/min (12/16/20 0600)   heparin 1,550 Units/hr (12/16/20 0600)   norepinephrine (LEVOPHED) Adult infusion Stopped (12/13/20 0952)   prismasol BGK 4/2.5 1,500 mL/hr at 12/16/20 0427    PRN Medications: sodium chloride, sodium chloride, sodium  chloride, sodium chloride, sodium chloride, sodium chloride, sodium chloride, acetaminophen, albuterol, alteplase, alteplase, alteplase, anticoagulant sodium citrate, calcium carbonate, cyclobenzaprine, heparin, heparin, HYDROcodone-acetaminophen, hydrocortisone cream, lidocaine (PF), lidocaine (PF), lidocaine-prilocaine, lidocaine-prilocaine, ondansetron (ZOFRAN) IV, oxyCODONE, pentafluoroprop-tetrafluoroeth, pentafluoroprop-tetrafluoroeth, prochlorperazine, sodium chloride, sodium chloride flush, sodium chloride flush    Patient Profile   Ms Tomer is a 65 year old with a history of chronic systolic hf previously EF 45%,  NICM, hypothyroid, PAF, gout, OSA, CKD Stage IV, HTN, hyperlipidemia, and lupus.   Admitted with A fib RVR and A/C systolic heart failure complicated by cardiogenic shock.     Assessment/Plan   1. Acute/Chronic Systolic Heart Failure -> Cardiogenic shock in setting AF with RVR - 8/1 Lactic acid 1.5. CO-OX 43%.--> started on milrinone and NE - Echo 7/22 EF 30-35% (2021)  This admit -> 25-30%. - NE stopped 8/12, then resumed briefly the morning of 8/15 -- later weaned off.  - CVVHD stopped 8/13. Tolerating iHD on dobutamine, but remains fluid overloaded.   - on DBA 5, co-ox improved to 71% - Unable to tolerate GDMT due to hypotension and AKI - Not candidate for advanced therapies due to size and ESRD. If unable to tolerate iHD off pressors/ fails inotrope wean, she would require hospice evaluation - Continue midodrine 15 mg TID  - Palliative care now  following  - CVVHD restarted on 12/15/20 to remove fluid - Will continue CVVHD to get CVP < 10. Once fluid off will restart iHD. If unable to manage her volume with iHD without inotropes then would be Hospice candidate.   2. PAF--> Afib RVR - Developed shock and had urgent cardioversion 8/1 with conversion to NSR w/ ERAF - Repeat DCCV 8/4 w/ ERAF. Converted to sinus tachycardia on 8/10 with IV amio - Afib again starting 8/17, DCCV on 8/19.  - Back in AF overnight 8/25 - unfortunately, not a candidate for ablation due to size if AF were to recur. Not candidate for AVN ablation and CRT. - plts steadily decreased, so she switched to bival due to concern for HIT; however, SRA negative, so she switched back to heparin on 9/24  Platelets  323K -> 266K today - continue amiodarone gtt 30 mg/hr while on dobutamine. Will likely need repeat DC-CV   3. AKI on CKD Stage IV -> ESRD - Baseline SCr ~3.2, AKI on admission to 7.33 - Renal US with no acute findings - Off CRRT on 8/13. No response to high-dose lasix. Now on iHD but struggling to keep fluid down, even after multiple sessions of dialysis  - CVVHD restarted on 12/15/20 to remove fluid  4. OSA - Uses CPAP, but now refusing    5. Hypothyroidism -TSH 8. T3 ok and T4 2.3  - Continue Synthroid at 88 mcg  - Repeat TSH in 6 weeks.    6. Gout - Uric acid of 10. Pain RLE that was a questionable flare.  Now resolved  - On uloric 40 mg daily  - Completed 3 day course of prednisone    7. Hypokalemia -- now resolved -Stable.   8. Deconditioning - Continue PT/OT--> CIR following.  - Continue to mobilize  9 Lupus - on low-dose prednisone.  10. Thrombocytopenia - Plt count of 121 -> 104 -- >62 switched from heparin drip to bival, but negative for HIT -- heparin resumed on 8/24  - Plt ct now improved -- is 323 t-> 266k oday  - No active bleeding noted   11. Tremor/ [place]ix[place]  - suspect due  to uremia. Improving w/ HD  -  NH3 ok at 20   12.  Hypervolemic Hyponatremia - improving w/ HD - Na 134 today  CRITICAL CARE Performed by: Glori Bickers  Total critical care time: 35 minutes  Critical care time was exclusive of separately billable procedures and treating other patients.  Critical care was necessary to treat or prevent imminent or life-threatening deterioration.  Critical care was time spent personally by me (independent of midlevel providers or residents) on the following activities: development of treatment plan with patient and/or surrogate as well as nursing, discussions with consultants, evaluation of patient's response to treatment, examination of patient, obtaining history from patient or surrogate, ordering and performing treatments and interventions, ordering and review of laboratory studies, ordering and review of radiographic studies, pulse oximetry and re-evaluation of patient's condition.    Glori Bickers, MD  7:20 AM 12/16/2020

## 2020-12-16 NOTE — Progress Notes (Signed)
Patient bleeding at HD catheter site, thrombi pad dressing placed to stop bleeding. NP  Kim notified at bedside. Will resume dialysis.

## 2020-12-16 NOTE — Progress Notes (Signed)
Responded to overhead emergency alarm.  Upon entering room, noted sats 85% w/ questionable wave form.  Pt was placed on NRB, pt is awake and answering questions, sat improved to 100%.  Per pt, she recently drank Ensure and she said if felt "thick in her throat", but states she is feeling better now.  BBSH diminished t/o mostly clear (? Faint crackles).  No distress noted, pt placed back on [place] 6 lpm, sat remains 100%.  Cardiology NP at bedside to assess pt.

## 2020-12-16 NOTE — Progress Notes (Signed)
Called to bedside due to bleeding at [facility] HD catheter site.  Noted moderate amount of blood on gown and blanket.  RN temporarily stopped CVVHD to apply QuikClot and re-dress site.  Dialysis later resumed.

## 2020-12-16 NOTE — Progress Notes (Addendum)
Patient c/o of SOB while sitting in chair stating "I can't breathe". O2 sats dropped in the 80's. Emergency alarm activated. Heart failure team and respiratory therapist at bedside.

## 2020-12-16 NOTE — Progress Notes (Signed)
Called by RN, pt was bleeding again from around HD catheter site.  CCM was putting in sutures to stop bleeding.  Very much appreciated.

## 2020-12-16 NOTE — Progress Notes (Signed)
[facility] NEPHROLOGY PROGRESS NOTE  Assessment/ Plan:  # AKI on CKD4: Sees [facility] at [employer].  Secondary to cardiorenal syndrome initially required CRRT after inadequate response to diuretics.  Volume status not controlled with frequent IHD, back on CRRT through the weekend and attempt to achieve euvolemia  Continue CRRT, 4K, aggressive UF 243mL/hr net neg Initiate citrate protocol Replete phosphorus today  # Acute exacerbation of congestive heart failure: Remains decompensated. on dobutamine for inotropic support; high CVPs, low Co-O2.  Midodrine 15 mg TID.  # Atrial fibrillation with rapid ventricular response: On amiodarone.  Bivalidrudin.   # Hyponatremia: 2/2 Free water excess and low GFR. Trend with HD. Cont fluid restriction as able.  Mild  # Anemia of chronic disease/chronic kidney disease: Cont ESA.  12/04/20 TSAT 72%  Subjective:  HD yesterday, 4L UF; then transition to CRRT for further UF and attempt to achieve euvolemia Tolerating CRRT, all 4K, achieving UF with an additional 2.8 L off; was 5 L negative yesterday CRRT has clotted twice since initiation, patient is on systemic heparin She has no complaints this morning, she does remain edematous and volume overloaded Potassium 4.1, phosphorus 1.5 this morning  Objective Vital signs in last 24 hours: Vitals:   12/16/20 0735 12/16/20 0745 12/16/20 0800 12/16/20 0822  BP:  97/73  102/76  Pulse:  (!) 131 (!) 124 68  Resp:      Temp: 98.4 F (36.9 C)     TempSrc: Oral     SpO2:  97% 98% 97%  Weight:      Height:       Weight change: 0 kg  Intake/Output Summary (Last 24 hours) at 12/16/2020 1100 Last data filed at 12/16/2020 0900 Gross per 24 hour  Intake 1595.85 ml  Output 7366 ml  Net -5770.15 ml        Labs: Basic Metabolic Panel: Recent Labs  Lab 12/15/20 0426 12/15/20 2203 12/16/20 0437  NA 130* 132* 134*  K 4.1 4.7 4.1  CL 89* 95* 96*  CO2 24 25 25   GLUCOSE 104* 112* 117*  BUN 39* 18  13  CREATININE 6.26* 3.33* 3.07*  CALCIUM 9.8 8.7* 9.2  PHOS 5.4* 1.9* 1.5*    Liver Function Tests: Recent Labs  Lab 12/15/20 0426 12/15/20 2203 12/16/20 0437  ALBUMIN 2.9* 2.9* 3.0*    No results for input(s): LIPASE, AMYLASE in the last 168 hours. Recent Labs  Lab 12/13/20 1115  AMMONIA 20    CBC: Recent Labs  Lab 12/12/20 0522 12/13/20 0336 12/14/20 0317 12/15/20 0426 12/16/20 0437  WBC 7.1 8.7 6.9 7.6 8.6  HGB 9.7* 8.8* 8.5* 9.0* 8.7*  HCT 31.3* 28.7* 27.2* 28.9* 27.7*  MCV 107.9* 107.9* 105.8* 105.5* 102.6*  PLT 259 297 277 323 266    Cardiac Enzymes: No results for input(s): CKTOTAL, CKMB, CKMBINDEX, TROPONINI in the last 168 hours. CBG: Recent Labs  Lab 12/15/20 0625 12/15/20 1127 12/15/20 1719 12/15/20 1930 12/16/20 0641  GLUCAP 94 107* 116* 108* 99     Iron Studies: No results for input(s): IRON, TIBC, TRANSFERRIN, FERRITIN in the last 72 hours. Studies/Results: DG CHEST PORT 1 VIEW  Result Date: 12/15/2020 CLINICAL DATA:  PICC line placement. EXAM: PORTABLE CHEST 1 VIEW COMPARISON:  Chest x-ray dated November 28, 2020. FINDINGS: PICC line catheter not identified. Unchanged bilateral internal jugular central venous catheters. Stable cardiomegaly and pulmonary vascular congestion. Mildly improved aeration of the left lung base. No pneumothorax or pleural effusion. No acute osseous abnormality. IMPRESSION:  1. PICC line catheter not identified. 2. Mildly improved aeration of the left lung base. Electronically Signed   By: Titus Dubin M.D.   On: 12/15/2020 11:34    Medications: Infusions:   prismasol BGK 4/2.5 500 mL/hr at 12/16/20 0041    prismasol BGK 4/2.5 200 mL/hr at 12/16/20 0427   sodium chloride     sodium chloride     sodium chloride     sodium chloride     sodium chloride     sodium chloride     sodium chloride     amiodarone 30 mg/hr (12/16/20 1019)   anticoagulant sodium citrate     calcium gluconate infusion for CRRT      citrate dextrose     DOBUTamine 5 mcg/kg/min (12/16/20 0900)   heparin 1,550 Units/hr (12/16/20 0900)   norepinephrine (LEVOPHED) Adult infusion Stopped (12/13/20 0952)   prismasol BGK 4/2.5 1,500 mL/hr at 12/16/20 0758   sodium phosphate  Dextrose 5% IVPB 30 mmol (12/16/20 0923)    Scheduled Medications:  (feeding supplement) PROSource Plus  30 mL Oral BID BM   calcitRIOL  0.25 mcg Oral Daily   Chlorhexidine Gluconate Cloth  6 each Topical Daily   Chlorhexidine Gluconate Cloth  6 each Topical Q0600   clotrimazole  1 application Topical BID   colchicine  0.3 mg Oral Daily   darbepoetin (ARANESP) injection - DIALYSIS  60 mcg Intravenous Q Tue-HD   estradiol  1 mg Oral Daily   febuxostat  40 mg Oral q AM   feeding supplement  237 mL Oral BID BM   ferrous sulfate  325 mg Oral q morning   fluticasone  2 spray Each Nare Daily   fluticasone furoate-vilanterol  1 puff Inhalation Daily   gabapentin  100 mg Oral Daily   insulin aspart  0-6 Units Subcutaneous TID WC   levothyroxine  88 mcg Oral q morning   lidocaine  1 patch Transdermal Q24H   midodrine  15 mg Oral TID WC   multivitamin  1 tablet Oral QHS   predniSONE  5 mg Oral Q breakfast   senna-docusate  2 tablet Oral BID   sodium chloride flush  10-40 mL Intracatheter Q12H   sodium chloride flush  3 mL Intravenous Q12H    have reviewed scheduled and prn medications.  Physical Exam: General: Sitting on chair, not in distress Heart: Irregular heart rate, s1s2 nl Lungs: Clear anteriorly, no wheezing Abdomen:soft, Non-tender, non-distended Extremities: 2-3+ LE edema present Dialysis Access: Right IJ temporary HD catheter in place  [facility] Oran B Kandon Hosking 12/16/2020,11:00 AM  LOS: 26 days

## 2020-12-16 NOTE — Progress Notes (Addendum)
Patient bleeding again around HD catheter site and pressure drsg applied. Mickel Baas NP notified and CCM at bedside placing sutures.

## 2020-12-16 NOTE — Progress Notes (Signed)
ANTICOAGULATION CONSULT NOTE - Follow-Up Consult  Pharmacy Consult for bivalirudin>>heparin Indication: atrial fibrillation  Patient Measurements: Height: 5' 3.5" (161.3 cm) Weight: 119.9 kg (264 lb 5.3 oz) IBW/kg (Calculated) : 53.55 Heparin Dosing Weight: 85 kg  Vital Signs: Temp: 98.4 F (36.9 C) (08/26 0735) Temp Source: Oral (08/26 0735) BP: 102/76 (08/26 0822) Pulse Rate: 68 (08/26 0822)  Labs: Recent Labs    12/14/20 0317 12/14/20 1901 12/15/20 0426 12/15/20 1322 12/15/20 2203 12/16/20 0437  HGB 8.5*  --  9.0*  --   --  8.7*  HCT 27.2*  --  28.9*  --   --  27.7*  PLT 277  --  323  --   --  266  APTT 80*  --   --   --   --   --   HEPARINUNFRC  --    < > 0.22* 0.29*  --  0.35  CREATININE 4.51*  --  6.26*  --  3.33* 3.07*   < > = values in this interval not displayed.     Estimated Creatinine Clearance: 23.4 mL/min (A) (by C-G formula based on SCr of 3.07 mg/dL (H)).   Assessment: 65 y.o. female with  h/o Afib, DOAC on hold, for heparin.  Pt was on Xarelto PTA and changed to Eliquis due to procedures.  Eliquis 5 mg last given at 2317 7/31.   Hit antibody negative (0.087). 8/17 Discussed with primary team given significant decline in platelet count will go ahead and change over to bival - SRA negative 8/17.   Discussed with team, will change from bivalirudin to heparin infusion given HIT negative and plt recovered.  Heparin drip 1550 uts/hr Heparin level 0.35 at [place] CBC stable   Goal of Therapy:  Heparin infusion: 0.3-0.7 units/mL Monitor platelets by anticoagulation protocol: Yes   Plan:  Continue heparin infusion 1550 units/hr Monitor HL, CBC, and for s/sx of bleeding    Bonnita Nasuti Pharm.D. CPP, BCPS Clinical Pharmacist [phone] 12/16/2020 11:59 AM

## 2020-12-16 NOTE — Progress Notes (Signed)
Inpatient Rehabilitation Admissions Coordinator   I continue to follow patient's progress at a distance.  Danne Baxter, RN, MSN Rehab Admissions Coordinator [phone] 12/16/2020 11:11 AM

## 2020-12-16 NOTE — Progress Notes (Signed)
Called by nursing staff for increased WOB  - Remains on HD and dobutamine 5 mcg + amio drip. .  - Sats dropped in the 80 with increased WOB. BP 104/81. Sinus Tach on monitor.  - Lungs clear no wheeze.   - Briefly placed on NRB with improved saturations then transitioned to 6 liters [place] with stable saturations.  RT and staff nurse at beside.   - Obtain CXR. No other change for now.    Suzan Manon NP-C  11:18 AM

## 2020-12-16 NOTE — Progress Notes (Signed)
Daily Progress Note   Patient Name: Caitlyn Fuentes       Date: 12/16/2020 DOB: July 27, 1955  Age: 65 y.o. MRN#: [id number] Attending Physician: Jolaine Artist, MD Primary Care Physician: Guadlupe Spanish, MD Admit Date: 11/20/2020  Reason for Consultation/Follow-up: Establishing goals of care  Subjective: Medical records reviewed. Discussed with RN Lelan Pons and assessed patient at the bedside. Sitting up in chair on room air. She reports feeling well. Per RN, patient's access has clotted twice overnight and she is back into AF.  Briefly reviewed interval history sine our last discussion. Created space and opportunity for patient's thoughts and feelings since palliative care involvement was explained to her son at that time. Caitlyn Fuentes shares that while she was concerned and hesitant herself (given prior discussions of potential hospice), her son has encouraged her to accept additional support from PMT. She is in agreement with her son making the arrangements for an upcoming family meeting and confirms his request for Sunday 8/28 at 3pm. I shared my concern that she remains at high risk to decompensate suddenly and again encouraged her to share this with her family so they can support her fully through these challenges. Caitlyn Fuentes agrees and voices her appreciation. She remains hopeful and relies on her faith but she is worried about what the future may bring.   I shared my recommendation to consider her code status in anticipation of this family meeting. Reviewed risks and benefits of CPR, emphasizing that this would likely cause more more suffering than benefit, despite a very low chance of success. Caitlyn Fuentes shares "I don't think I would want that, it sounds like it would hurt." Discussed the risk of  complications after CPR and explained that she also has the option of choosing a peaceful and natural death in the event of cardiopulmonary arrest. Counseled that all other interventions would still be continued and a DNR order would solely indicate her wishes if her heart and lungs stopped despite these aggressive interventions.  Questions and concerns addressed. PMT will continue to support holistically.  Length of Stay: 26  Current Medications: Scheduled Meds:   (feeding supplement) PROSource Plus  30 mL Oral BID BM   calcitRIOL  0.25 mcg Oral Daily   Chlorhexidine Gluconate Cloth  6 each Topical Daily   Chlorhexidine Gluconate Cloth  6 each Topical  Q0600   clotrimazole  1 application Topical BID   colchicine  0.3 mg Oral Daily   darbepoetin (ARANESP) injection - DIALYSIS  60 mcg Intravenous Q Tue-HD   estradiol  1 mg Oral Daily   febuxostat  40 mg Oral q AM   feeding supplement  237 mL Oral BID BM   ferrous sulfate  325 mg Oral q morning   fluticasone  2 spray Each Nare Daily   fluticasone furoate-vilanterol  1 puff Inhalation Daily   gabapentin  100 mg Oral Daily   insulin aspart  0-6 Units Subcutaneous TID WC   levothyroxine  88 mcg Oral q morning   lidocaine  1 patch Transdermal Q24H   midodrine  15 mg Oral TID WC   multivitamin  1 tablet Oral QHS   predniSONE  5 mg Oral Q breakfast   senna-docusate  2 tablet Oral BID   sodium chloride flush  10-40 mL Intracatheter Q12H   sodium chloride flush  3 mL Intravenous Q12H    Continuous Infusions:   prismasol BGK 4/2.5 500 mL/hr at 12/16/20 0041    prismasol BGK 4/2.5 200 mL/hr at 12/16/20 1226   sodium chloride     sodium chloride     sodium chloride     sodium chloride     sodium chloride     sodium chloride     sodium chloride     amiodarone 30 mg/hr (12/16/20 1100)   anticoagulant sodium citrate     calcium gluconate infusion for CRRT 20 g (12/16/20 1124)   citrate dextrose     DOBUTamine 5 mcg/kg/min (12/16/20  1100)   heparin 1,550 Units/hr (12/16/20 1100)   norepinephrine (LEVOPHED) Adult infusion Stopped (12/13/20 0952)   prismasol BGK 4/2.5 1,500 mL/hr at 12/16/20 1157   sodium phosphate  Dextrose 5% IVPB 43 mL/hr at 12/16/20 1100    PRN Meds: sodium chloride, sodium chloride, sodium chloride, sodium chloride, sodium chloride, sodium chloride, sodium chloride, acetaminophen, albuterol, alteplase, alteplase, alteplase, anticoagulant sodium citrate, calcium carbonate, cyclobenzaprine, heparin, heparin, HYDROcodone-acetaminophen, hydrocortisone cream, lidocaine (PF), lidocaine (PF), lidocaine-prilocaine, lidocaine-prilocaine, ondansetron (ZOFRAN) IV, oxyCODONE, pentafluoroprop-tetrafluoroeth, pentafluoroprop-tetrafluoroeth, prochlorperazine, sodium chloride, sodium chloride flush, sodium chloride flush  Physical Exam Vitals and nursing note reviewed.  Constitutional:      General: She is not in acute distress.    Appearance: She is ill-appearing.  Cardiovascular:     Rate and Rhythm: Regular rhythm. Tachycardia present.  Pulmonary:     Effort: Tachypnea present.  Neurological:     Mental Status: She is alert and oriented to person, place, and time.  Psychiatric:        Behavior: Behavior is cooperative.            Vital Signs: BP (!) 101/32 (BP Location: Right Arm)   Pulse 100   Temp 98.6 F (37 C) (Oral)   Resp (!) 21   Ht 5' 3.5" (1.613 m)   Wt 119.9 kg   SpO2 100%   BMI 46.09 kg/m  SpO2: SpO2: 100 % O2 Device: O2 Device: Nasal Cannula O2 Flow Rate: O2 Flow Rate (L/min): 4 L/min  Intake/output summary:  Intake/Output Summary (Last 24 hours) at 12/16/2020 1238 Last data filed at 12/16/2020 1200 Gross per 24 hour  Intake 1644.89 ml  Output 3880 ml  Net -2235.11 ml    LBM: Last BM Date: 12/13/20 Baseline Weight: Weight: 119.7 kg Most recent weight: Weight: 119.9 kg       Palliative Assessment/Data: 30%  Patient Active Problem List   Diagnosis Date Noted   Goals  of care, counseling/discussion    Palliative care by specialist    Atrial fibrillation with rapid ventricular response ([place]) 11/20/2020   CHF (congestive heart failure) ([place]) 11/20/2020   CKD (chronic kidney disease) stage 4, GFR 15-29 ml/min ([place]) 10/16/2019   Acute renal failure superimposed on stage 4 chronic kidney disease ([place]) 10/16/2019   Acute on chronic systolic heart failure ([place]) 10/16/2019   Gout due to renal impairment 10/16/2019   Morbid obesity ([place]) 10/16/2019   Hypokalemia 10/15/2019   CKD (chronic kidney disease) stage 3, GFR 30-59 ml/min ([facility]) 08/28/2016   Dizziness 08/17/2016   Acute hypokalemia 08/17/2016   Acute hyponatremia 08/17/2016   Chest tightness 08/17/2016   Near syncope 08/17/2016   Nonischemic cardiomyopathy ([place]) 05/25/2015   Chronic combined systolic and diastolic heart failure ([place]) 05/25/2015   Palpitations 05/25/2015   Gout 10/16/2013   Hyperlipidemia 10/16/2013   Long term current use of anticoagulant therapy 07/20/2013   Chest pain, atypical 08/16/2011   Morbid obesity ([place]) 08/16/2011   Bilateral lower extremity edema 08/16/2011   Obstructive sleep apnea on CPAP 08/16/2011   Paroxysmal atrial fibrillation ([place]) 08/16/2011    Palliative Care Assessment & Plan   Patient Profile:  65 y.o. female  with past medical history of PAF on systemic anticoagulation, chronic systolic CHF secondary to nonischemic cardiomyopathy, CKD stage IV, SLE on chronic steroids, HTN, COPD, pulmonary hypertension, gout, hypothyroidism admitted on 11/20/2020 with worsening of palpitations and shortness of breath and leg swelling.   Patient was found to have afib with RVR, acute on chronic systolic heart failure, cardiogenic shock, and AKI due to cardiorenal syndrome requiring CRRT. She is now off pressors and undergoing trials of iHD. Not a candidate for advanced HF therapies or ablation. Palliative medicine has been consulted to assist with goals of care  conversation.  Assessment: Goals of care conversation ESRD on HD Acute on chronic systolic heart failure Deconditioning  Recommendations/Plan: Continue full code/full scope Patient is still hopeful for improvement; she is considering DNR and would like support to discuss this with her family Patient's son Trae has requested family meeting on Sunday 8/28 at 3pm with 4 family members attending in person and 4 via conference call Ongoing support and discussions with PMT  Goals of Care and Additional Recommendations: Limitations on Scope of Treatment: Full Scope Treatment  Prognosis:  Guarded  Discharge Planning: To Be Determined  Care plan was discussed with Patient  Total time: 25 minutes Greater than 50% of this time was spent in counseling and coordinating care related to the above assessment and plan.  Dorthy Cooler, PA-C Palliative Medicine Team Team phone # [phone]  Thank you for allowing the Palliative Medicine Team to assist in the care of this patient. Please utilize secure chat with additional questions, if there is no response within 30 minutes please call the above phone number.  Palliative Medicine Team providers are available by phone from 7am to 7pm daily and can be reached through the team cell phone.  Should this patient require assistance outside of these hours, please call the patient's attending physician.

## 2020-12-17 ENCOUNTER — Inpatient Hospital Stay (HOSPITAL_COMMUNITY): Payer: Medicare Other

## 2020-12-17 DIAGNOSIS — I5023 Acute on chronic systolic (congestive) heart failure: Secondary | ICD-10-CM | POA: Diagnosis not present

## 2020-12-17 DIAGNOSIS — N184 Chronic kidney disease, stage 4 (severe): Secondary | ICD-10-CM | POA: Diagnosis not present

## 2020-12-17 DIAGNOSIS — N170 Acute kidney failure with tubular necrosis: Secondary | ICD-10-CM | POA: Diagnosis not present

## 2020-12-17 LAB — POCT I-STAT, CHEM 8
BUN: 14 mg/dL (ref 8–23)
BUN: 12 mg/dL (ref 8–23)
BUN: 12 mg/dL (ref 8–23)
BUN: 12 mg/dL (ref 8–23)
BUN: 18 mg/dL (ref 8–23)
BUN: 18 mg/dL (ref 8–23)
Calcium, Ion: 0.66 mmol/L — CL (ref 1.15–1.40)
Calcium, Ion: 0.48 mmol/L — CL (ref 1.15–1.40)
Calcium, Ion: 0.56 mmol/L — CL (ref 1.15–1.40)
Calcium, Ion: 0.82 mmol/L — CL (ref 1.15–1.40)
Calcium, Ion: 0.89 mmol/L — CL (ref 1.15–1.40)
Calcium, Ion: 0.91 mmol/L — ABNORMAL LOW (ref 1.15–1.40)
Chloride: 90 mmol/L — ABNORMAL LOW (ref 98–111)
Chloride: 88 mmol/L — ABNORMAL LOW (ref 98–111)
Chloride: 88 mmol/L — ABNORMAL LOW (ref 98–111)
Chloride: 89 mmol/L — ABNORMAL LOW (ref 98–111)
Chloride: 90 mmol/L — ABNORMAL LOW (ref 98–111)
Chloride: 92 mmol/L — ABNORMAL LOW (ref 98–111)
Creatinine, Ser: 2.6 mg/dL — ABNORMAL HIGH (ref 0.44–1.00)
Creatinine, Ser: 2.3 mg/dL — ABNORMAL HIGH (ref 0.44–1.00)
Creatinine, Ser: 2.3 mg/dL — ABNORMAL HIGH (ref 0.44–1.00)
Creatinine, Ser: 2.5 mg/dL — ABNORMAL HIGH (ref 0.44–1.00)
Creatinine, Ser: 2.6 mg/dL — ABNORMAL HIGH (ref 0.44–1.00)
Creatinine, Ser: 2.7 mg/dL — ABNORMAL HIGH (ref 0.44–1.00)
Glucose, Bld: 157 mg/dL — ABNORMAL HIGH (ref 70–99)
Glucose, Bld: 126 mg/dL — ABNORMAL HIGH (ref 70–99)
Glucose, Bld: 132 mg/dL — ABNORMAL HIGH (ref 70–99)
Glucose, Bld: 137 mg/dL — ABNORMAL HIGH (ref 70–99)
Glucose, Bld: 153 mg/dL — ABNORMAL HIGH (ref 70–99)
Glucose, Bld: 163 mg/dL — ABNORMAL HIGH (ref 70–99)
HCT: 33 % — ABNORMAL LOW (ref 36.0–46.0)
HCT: 29 % — ABNORMAL LOW (ref 36.0–46.0)
HCT: 29 % — ABNORMAL LOW (ref 36.0–46.0)
HCT: 29 % — ABNORMAL LOW (ref 36.0–46.0)
HCT: 30 % — ABNORMAL LOW (ref 36.0–46.0)
HCT: 30 % — ABNORMAL LOW (ref 36.0–46.0)
Hemoglobin: 11.2 g/dL — ABNORMAL LOW (ref 12.0–15.0)
Hemoglobin: 10.2 g/dL — ABNORMAL LOW (ref 12.0–15.0)
Hemoglobin: 10.2 g/dL — ABNORMAL LOW (ref 12.0–15.0)
Hemoglobin: 9.9 g/dL — ABNORMAL LOW (ref 12.0–15.0)
Hemoglobin: 9.9 g/dL — ABNORMAL LOW (ref 12.0–15.0)
Hemoglobin: 9.9 g/dL — ABNORMAL LOW (ref 12.0–15.0)
Potassium: 3.6 mmol/L (ref 3.5–5.1)
Potassium: 3.4 mmol/L — ABNORMAL LOW (ref 3.5–5.1)
Potassium: 3.5 mmol/L (ref 3.5–5.1)
Potassium: 3.6 mmol/L (ref 3.5–5.1)
Potassium: 4 mmol/L (ref 3.5–5.1)
Potassium: 4 mmol/L (ref 3.5–5.1)
Sodium: 135 mmol/L (ref 135–145)
Sodium: 133 mmol/L — ABNORMAL LOW (ref 135–145)
Sodium: 134 mmol/L — ABNORMAL LOW (ref 135–145)
Sodium: 134 mmol/L — ABNORMAL LOW (ref 135–145)
Sodium: 135 mmol/L (ref 135–145)
Sodium: 135 mmol/L (ref 135–145)
TCO2: 28 mmol/L (ref 22–32)
TCO2: 29 mmol/L (ref 22–32)
TCO2: 29 mmol/L (ref 22–32)
TCO2: 29 mmol/L (ref 22–32)
TCO2: 30 mmol/L (ref 22–32)
TCO2: 31 mmol/L (ref 22–32)

## 2020-12-17 LAB — RENAL FUNCTION PANEL
Albumin: 2.6 g/dL — ABNORMAL LOW (ref 3.5–5.0)
Anion gap: 16 — ABNORMAL HIGH (ref 5–15)
BUN: 12 mg/dL (ref 8–23)
CO2: 30 mmol/L (ref 22–32)
Calcium: 10.7 mg/dL — ABNORMAL HIGH (ref 8.9–10.3)
Chloride: 89 mmol/L — ABNORMAL LOW (ref 98–111)
Creatinine, Ser: 2.56 mg/dL — ABNORMAL HIGH (ref 0.44–1.00)
GFR, Estimated: 20 mL/min — ABNORMAL LOW (ref >60)
Glucose, Bld: 141 mg/dL — ABNORMAL HIGH (ref 70–99)
Phosphorus: 2 mg/dL — ABNORMAL LOW (ref 2.5–4.6)
Potassium: 3.5 mmol/L (ref 3.5–5.1)
Sodium: 135 mmol/L (ref 135–145)

## 2020-12-17 LAB — MAGNESIUM
Magnesium: 1.9 mg/dL (ref 1.7–2.4)
Magnesium: 1.9 mg/dL (ref 1.7–2.4)

## 2020-12-17 LAB — POCT I-STAT EG7
Acid-Base Excess: 0 mmol/L (ref 0.0–2.0)
Bicarbonate: 26.8 mmol/L (ref 20.0–28.0)
Calcium, Ion: 1.14 mmol/L — ABNORMAL LOW (ref 1.15–1.40)
HCT: 31 % — ABNORMAL LOW (ref 36.0–46.0)
Hemoglobin: 10.5 g/dL — ABNORMAL LOW (ref 12.0–15.0)
O2 Saturation: 34 %
Patient temperature: 98.3
Potassium: 5.1 mmol/L (ref 3.5–5.1)
Sodium: 134 mmol/L — ABNORMAL LOW (ref 135–145)
TCO2: 28 mmol/L (ref 22–32)
pCO2, Ven: 49.3 mmHg (ref 44.0–60.0)
pH, Ven: 7.342 (ref 7.250–7.430)
pO2, Ven: 22 mmHg — CL (ref 32.0–45.0)

## 2020-12-17 LAB — GLUCOSE, CAPILLARY
Glucose-Capillary: 123 mg/dL — ABNORMAL HIGH (ref 70–99)
Glucose-Capillary: 116 mg/dL — ABNORMAL HIGH (ref 70–99)
Glucose-Capillary: 138 mg/dL — ABNORMAL HIGH (ref 70–99)
Glucose-Capillary: 120 mg/dL — ABNORMAL HIGH (ref 70–99)

## 2020-12-17 LAB — COOXEMETRY PANEL
Carboxyhemoglobin: 1.6 % — ABNORMAL HIGH (ref 0.5–1.5)
Carboxyhemoglobin: 1.2 % (ref 0.5–1.5)
Methemoglobin: 0.8 % (ref 0.0–1.5)
Methemoglobin: 0.8 % (ref 0.0–1.5)
O2 Saturation: 51.5 %
O2 Saturation: 31.8 %
Total hemoglobin: 8.2 g/dL — ABNORMAL LOW (ref 12.0–16.0)
Total hemoglobin: 8.8 g/dL — ABNORMAL LOW (ref 12.0–16.0)

## 2020-12-17 LAB — BASIC METABOLIC PANEL
Anion gap: 11 (ref 5–15)
BUN: 12 mg/dL (ref 8–23)
CO2: 28 mmol/L (ref 22–32)
Calcium: 8.8 mg/dL — ABNORMAL LOW (ref 8.9–10.3)
Chloride: 95 mmol/L — ABNORMAL LOW (ref 98–111)
Creatinine, Ser: 2.5 mg/dL — ABNORMAL HIGH (ref 0.44–1.00)
GFR, Estimated: 21 mL/min — ABNORMAL LOW (ref >60)
Glucose, Bld: 136 mg/dL — ABNORMAL HIGH (ref 70–99)
Potassium: 5.1 mmol/L (ref 3.5–5.1)
Sodium: 134 mmol/L — ABNORMAL LOW (ref 135–145)

## 2020-12-17 LAB — LACTIC ACID, PLASMA: Lactic Acid, Venous: 5.9 mmol/L (ref 0.5–1.9)

## 2020-12-17 LAB — HEPARIN LEVEL (UNFRACTIONATED): Heparin Unfractionated: 0.38 IU/mL (ref 0.30–0.70)

## 2020-12-17 MED ORDER — METHYLPREDNISOLONE SODIUM SUCC 125 MG IJ SOLR
INTRAMUSCULAR | Status: AC
  Administered 2020-12-17: 125 mg via INTRAVENOUS
  Filled 2020-12-17: qty 2

## 2020-12-17 MED ORDER — METHYLPREDNISOLONE SODIUM SUCC 125 MG IJ SOLR
125.0000 mg | Freq: Once | INTRAMUSCULAR | Status: AC
  Administered 2020-12-17: 125 mg via INTRAVENOUS
  Filled 2020-12-17: qty 2

## 2020-12-17 MED ORDER — POTASSIUM PHOSPHATES 15 MMOLE/5ML IV SOLN
30.0000 mmol | Freq: Once | INTRAVENOUS | Status: AC
  Administered 2020-12-17: 30 mmol via INTRAVENOUS
  Filled 2020-12-17: qty 10

## 2020-12-17 NOTE — Progress Notes (Signed)
ANTICOAGULATION CONSULT NOTE - Follow-Up Consult  Pharmacy Consult for bivalirudin>>heparin Indication: atrial fibrillation  Patient Measurements: Height: 5' 3.5" (161.3 cm) Weight: 119.9 kg (264 lb 5.3 oz) IBW/kg (Calculated) : 53.55 Heparin Dosing Weight: 85 kg  Vital Signs: Temp: 98.4 F (36.9 C) (08/26 2020) Temp Source: Oral (08/26 2020) BP: 99/48 (08/27 0500) Pulse Rate: 94 (08/27 0500)  Labs: Recent Labs    12/15/20 0426 12/15/20 1322 12/15/20 2203 12/16/20 0437 12/16/20 1323 12/17/20 0219 12/17/20 0419 12/17/20 0500 12/17/20 0503  HGB 9.0*  --   --  8.7*   < > 9.9* 10.2*  --  9.9*  HCT 28.9*  --   --  27.7*   < > 29.0* 30.0*  --  29.0*  PLT 323  --   --  266  --   --   --   --   --   HEPARINUNFRC 0.22* 0.29*  --  0.35  --   --   --  0.38  --   CREATININE 6.26*  --    < > 3.07*   < > 2.60* 2.30* 2.56* 2.50*   < > = values in this interval not displayed.     Estimated Creatinine Clearance: 28.7 mL/min (A) (by C-G formula based on SCr of 2.5 mg/dL (H)).   Assessment: 65 y.o. female with  h/o Afib, DOAC on hold, for heparin.  Pt was on Xarelto PTA and changed to Eliquis due to procedures.  Eliquis 5 mg last given at 2317 7/31.   Hit antibody negative (0.087). 8/17 Discussed with primary team given significant decline in platelet count will go ahead and change over to bival - SRA negative 8/17.   Discussed with team, will change from bivalirudin to heparin infusion given HIT negative and plt recovered.   Heparin level is therapeutic at 0.38, on 1550 units/hr. Hgb 9.9, plt 266 on last check. Had bleeding at HD catheter site - stable now with sutures in place. No infusion issues.  Goal of Therapy:  Heparin infusion: 0.3-0.7 units/mL Monitor platelets by anticoagulation protocol: Yes   Plan:  Continue heparin infusion 1550 units/hr Monitor HL, CBC, and for s/sx of bleeding   Antonietta Jewel, PharmD, BCCCP Clinical Pharmacist  Phone: [phone] 12/17/2020 7:17  AM  Please check AMION for all [facility] phone numbers After 10:00 PM, call [place] [phone]

## 2020-12-17 NOTE — Progress Notes (Addendum)
Patient ID: Caitlyn Fuentes, female   DOB: 09/28/1955, 65 y.o.   MRN: [id number]     Advanced Heart Failure Rounding Note  PCP-Cardiologist: Shelva Majestic, MD  HF clinic: Dr. Haroldine Laws  Subjective:    Events: - 8/1 A fib RVR & A/C systolic heart faiure --> cardiogenic shock. Had urgent DC-CV with brief conversion to NSR but went back in A fib. . On Norepi + milrinone. AKI. Nephrology consulted.  - 8/2 A fib RVR, amio gtt. Continued on milrinone 0.375 mcg + norepi 2 mcg. Started on prednisone 40 mg daily x3 days for gout flare.  - 8/3 did not tolerate milrinone wean w/ drop in Co-ox 68%--->48% and decrease in UOP. Milrinone increased back to 0.375.  Lasix gtt increased to 30/hr.  - 8/4 NE added back given persistently low Co-ox - 8/4 s/p TEE/DCCV>>NSR  - 8/5 back in Afib w/ RVR  -  08/09 HD cath placed, CRRT started - 8/10: conversion to sinus tachycardia  - 8/13 CRRT stopped - 8/15 recurrent AF - 8/16 tolerated iHD - 8/18 tolerated iHD  - 8/19 DCCV to NSR - 8/22 DBA increased to 5  - 8/25 Had iHD/ Restarted CVVHD   Remains on CVVHD. Negative 2L. No weight recorded today. (Lowest weight this admit 255)  Remains on DBA 5. Co-ox 52%  Remains on IV amio and heparin. Has converted back to NSR on IV amio.   Had episode of dyspnea yesterday after choking on some fluid. Resolved. Appetite poor. Feels weak. Tired. Continues to bleed from HD cath site. CVP 10-11  Objective:   Weight Range: 119.9 kg Body mass index is 46.09 kg/m.   Vital Signs:   Temp:  [98.4 F (36.9 C)-98.7 F (37.1 C)] 98.4 F (36.9 C) (08/26 2020) Pulse Rate:  [60-108] 108 (08/27 0800) Resp:  [15-33] 24 (08/27 0800) BP: (76-126)/(32-111) 101/60 (08/27 0800) SpO2:  [95 %-100 %] 95 % (08/27 0800) Last BM Date: 12/13/20  Weight change: Filed Weights   12/15/20 1145 12/15/20 1530 12/16/20 0600  Weight: 120.8 kg 121.6 kg 119.9 kg    Intake/Output:   Intake/Output Summary (Last 24 hours) at 12/17/2020  0900 Last data filed at 12/17/2020 0700 Gross per 24 hour  Intake 1404.41 ml  Output 2950 ml  Net -1545.59 ml      Physical Exam   General:  Sitting up. No resp difficulty HEENT: normal Neck: supple. RIJ HD cath  Carotids 2+ bilat; no bruits. No lymphadenopathy or thryomegaly appreciated. Cor: PMI nondisplaced. Regular tachy . No rubs, gallops or murmurs. Lungs: clear Abdomen: obese soft, nontender, nondistended. No hepatosplenomegaly. No bruits or masses. Good bowel sounds. Extremities: no cyanosis, clubbing, rash, 1+ edema Wrapped  Neuro: alert & orientedx3, cranial nerves grossly intact. moves all 4 extremities w/o difficulty. Affect pleasant    Telemetry   NSR 100-110 Personally reviewed  Labs    CBC Recent Labs    12/15/20 0426 12/16/20 0437 12/16/20 1504 12/17/20 0419 12/17/20 0503  WBC 7.6 8.6  --   --   --   HGB 9.0* 8.7*   < > 10.2* 9.9*  HCT 28.9* 27.7*   < > 30.0* 29.0*  MCV 105.5* 102.6*  --   --   --   PLT 323 266  --   --   --    < > = values in this interval not displayed.    Basic Metabolic Panel Recent Labs    12/16/20 0437 12/16/20 1323 12/16/20 1504 12/17/20 0500 12/17/20  0503  NA 134* 133*   < > 135 134*  K 4.1 4.5   < > 3.5 3.5  CL 96* 93*   < > 89* 88*  CO2 25 25  --  30  --   GLUCOSE 117* 196*   < > 141* 137*  BUN 13 15   < > 12 12  CREATININE 3.07* 2.79*   < > 2.56* 2.50*  CALCIUM 9.2 8.9  --  10.7*  --   MG 2.2  --   --  1.9  --   PHOS 1.5*  --   --  2.0*  --    < > = values in this interval not displayed.    Liver Function Tests Recent Labs    12/16/20 0437 12/17/20 0500  ALBUMIN 3.0* 2.6*     No results for input(s): LIPASE, AMYLASE in the last 72 hours. Cardiac Enzymes No results for input(s): CKTOTAL, CKMB, CKMBINDEX, TROPONINI in the last 72 hours.  BNP: BNP (last 3 results) Recent Labs    11/20/20 0318 11/21/20 0258  BNP 2,051.3* 2,022.4*     ProBNP (last 3 results) No results for input(s): PROBNP  in the last 8760 hours.   D-Dimer No results for input(s): DDIMER in the last 72 hours. Hemoglobin A1C No results for input(s): HGBA1C in the last 72 hours.  Fasting Lipid Panel No results for input(s): CHOL, HDL, LDLCALC, TRIG, CHOLHDL, LDLDIRECT in the last 72 hours. Thyroid Function Tests No results for input(s): TSH, T4TOTAL, T3FREE, THYROIDAB in the last 72 hours.  Invalid input(s): FREET3   Other results:   Imaging    DG CHEST PORT 1 VIEW  Result Date: 12/16/2020 CLINICAL DATA:  Dyspnea and respiratory abnormalities. EXAM: PORTABLE CHEST 1 VIEW COMPARISON:  12/15/2020 FINDINGS: Left jugular central line is near the upper SVC and pointing towards the right side of the chest. Left jugular central line is stable in position. Again noted is a right jugular dialysis catheter with the tip in the SVC region. Stable cardiomegaly. Limited evaluation of the left lung base due to the cardiomegaly. Otherwise, the lungs are clear. IMPRESSION: 1. Stable chest radiograph findings. 2. Cardiomegaly without significant lung disease. However, limited evaluation of the left lung base. 3. Stable position of the central lines. Electronically Signed   By: Markus Daft M.D.   On: 12/16/2020 13:26     Medications:     Scheduled Medications:  (feeding supplement) PROSource Plus  30 mL Oral BID BM   calcitRIOL  0.25 mcg Oral Daily   Chlorhexidine Gluconate Cloth  6 each Topical Daily   Chlorhexidine Gluconate Cloth  6 each Topical Q0600   clotrimazole  1 application Topical BID   colchicine  0.3 mg Oral Daily   darbepoetin (ARANESP) injection - DIALYSIS  60 mcg Intravenous Q Tue-HD   estradiol  1 mg Oral Daily   febuxostat  40 mg Oral q AM   feeding supplement  237 mL Oral BID BM   ferrous sulfate  325 mg Oral q morning   fluticasone  2 spray Each Nare Daily   fluticasone furoate-vilanterol  1 puff Inhalation Daily   gabapentin  100 mg Oral Daily   insulin aspart  0-6 Units Subcutaneous TID WC    levothyroxine  88 mcg Oral q morning   lidocaine  1 patch Transdermal Q24H   midodrine  15 mg Oral TID WC   multivitamin  1 tablet Oral QHS   predniSONE  5 mg Oral  Q breakfast   senna-docusate  2 tablet Oral BID   sodium chloride flush  10-40 mL Intracatheter Q12H   sodium chloride flush  3 mL Intravenous Q12H    Infusions:   prismasol BGK 4/2.5 500 mL/hr at 12/17/20 0848    prismasol BGK 4/2.5 200 mL/hr at 12/17/20 2706   sodium chloride     sodium chloride     sodium chloride     sodium chloride     sodium chloride     sodium chloride     sodium chloride     amiodarone 30 mg/hr (12/17/20 0745)   anticoagulant sodium citrate     DOBUTamine 5 mcg/kg/min (12/17/20 0700)   heparin 1,550 Units/hr (12/17/20 0743)   norepinephrine (LEVOPHED) Adult infusion Stopped (12/13/20 0952)   prismasol BGK 4/2.5 1,500 mL/hr at 12/16/20 2346    PRN Medications: sodium chloride, sodium chloride, sodium chloride, sodium chloride, sodium chloride, sodium chloride, sodium chloride, acetaminophen, albuterol, alteplase, alteplase, anticoagulant sodium citrate, calcium carbonate, cyclobenzaprine, heparin, heparin, HYDROcodone-acetaminophen, hydrocortisone cream, lidocaine (PF), lidocaine (PF), lidocaine-prilocaine, lidocaine-prilocaine, ondansetron (ZOFRAN) IV, oxyCODONE, pentafluoroprop-tetrafluoroeth, pentafluoroprop-tetrafluoroeth, prochlorperazine, sodium chloride, sodium chloride flush, sodium chloride flush    Patient Profile   Caitlyn Fuentes is a 65 year old with a history of chronic systolic hf previously EF 45%,  NICM, hypothyroid, PAF, gout, OSA, CKD Stage IV, HTN, hyperlipidemia, and lupus.   Admitted with A fib RVR and A/C systolic heart failure complicated by cardiogenic shock.     Assessment/Plan   1. Acute/Chronic Systolic Heart Failure -> Cardiogenic shock in setting AF with RVR - 8/1 Lactic acid 1.5. CO-OX 43%.--> started on milrinone and NE - Echo 7/22 EF 30-35% (2021)  This  admit -> 25-30%. - NE stopped 8/12, then resumed briefly the morning of 8/15 -- later weaned off.  - CVVHD stopped 8/13 -> iHD. CVVHD restarted on 8/25 to get fluid down. - Unable to tolerate GDMT due to hypotension and AKI - Continue midodrine 15 mg TID  - Remains on DBA 5. Co-ox 52% - Palliative care now following  - Will continue CVVHD to get CVP < 10. CVP today 10-11. Once fluid off will restart iHD. If unable to manage her volume with iHD without inotropes then would be Hospice candidate.  - Family meeting tomorrow to discuss [facility]  2. AFL with RVR - Developed shock and had urgent cardioversion 8/1 with conversion to NSR w/ ERAF - Repeat DCCV 8/4 w/ ERAF. Converted to sinus tachycardia on 8/10 with IV amio - Afib again starting 8/17, DCCV on 8/19.  - Back in AF overnight 8/25. Converted on amio - Seen by EP. Felt not to be a candidate for ablation. Not candidate for AVN ablation and CRT due to need for HD and risk of device infection - plts steadily decreased, so she switched to bival due to concern for HIT; however, SRA negative, so she switched back to heparin on 9/24  Platelets  rebounded. Needs CBC in am - continue amiodarone gtt 30 mg/hr while on dobutamine.    3. 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 OSA - Uses CPAP, but now refusing    5. Hypothyroidism -TSH 8. T3 ok and T4 2.3  - Continue Synthroid at 88 mcg  - Repeat TSH in 6 weeks.    6. Gout -  Uric acid of 10. Pain RLE that was a questionable flare.  Now resolved  - On uloric 40 mg daily  - Completed 3 day course of prednisone    7. Hypokalemia -- now resolved -Stable.   8. Deconditioning - Continue PT/OT--> CIR following.  - Continue to mobilize  9 Lupus - on low-dose prednisone.  10. Thrombocytopenia - Plt count of 121 -> 104 -- >62 switched from heparin drip to bival,  but negative for HIT -- heparin resumed on 8/24  - Recheck CBC in am   11. Hypervolemic Hyponatremia - improving w/ HD - Na 134 today  I re-sutured HD cath in place.   CRITICAL CARE Performed by: Glori Bickers  Total critical care time: 35 minutes  Critical care time was exclusive of separately billable procedures and treating other patients.  Critical care was necessary to treat or prevent imminent or life-threatening deterioration.  Critical care was time spent personally by me (independent of midlevel providers or residents) on the following activities: development of treatment plan with patient and/or surrogate as well as nursing, discussions with consultants, evaluation of patient's response to treatment, examination of patient, obtaining history from patient or surrogate, ordering and performing treatments and interventions, ordering and review of laboratory studies, ordering and review of radiographic studies, pulse oximetry and re-evaluation of patient's condition.    Glori Bickers, MD  9:00 AM 12/17/2020

## 2020-12-17 NOTE — Significant Event (Signed)
Respiratory distress again. Tachypnic and extremely anxious. Breathing tx x2. AF in the 140s and MAPs in the 120s. Methylpred 125 mg IV x1 given. CXR with pulm edema but not significnatly worse from prior. Had been off CVVH since 20:00 as circuit keeps clotting. Resumed at UF 200/h. Placed on BiPAP with improvement in sx. VBG 7.34/49 on BiPAP. Lactate/BMP/Mg/CoOx pending. Revisited GOC after reviewing notes. Right now she does want to remain full code and understands we are bordering on futility in terms of what we may be able to offer given numerous comorbities and prolonged hospitalization. Will f/u with her tonight and reassess.

## 2020-12-17 NOTE — Progress Notes (Signed)
Water chamber refilled per pt's request. No further assistance needed with pt's home CPAP.

## 2020-12-17 NOTE — Progress Notes (Addendum)
[facility] NEPHROLOGY PROGRESS NOTE  Assessment/ Plan:  # AKI on CKD4: Sees [facility] at [employer].  Secondary to cardiorenal syndrome initially required CRRT after inadequate response to diuretics.  Volume status not controlled with frequent IHD, back on CRRT through the weekend and attempt to achieve euvolemia  Continue CRRT, 4K, aggressive UF up to 291mL/hr net neg and AHF  assisting; goal CVP < 10; inc pre fluids to attempt to lessen clogging.   Citrate inffective for clotting, sig more bleeding around HD cath; stop today Replete phosphorus today  # Acute exacerbation of congestive heart failure: Remains decompensated. on dobutamine for inotropic support; improving CVPs, low Co-O2.  Midodrine 15 mg TID.  # Atrial fibrillation with rapid ventricular response: On amiodarone.  Bivalidrudin.   # Hyponatremia: 2/2 Free water excess and low GFR. Trend with HD. Cont fluid restriction as able.  Mild  # Anemia of chronic disease/chronic kidney disease: Cont ESA.  12/04/20 TSAT 72%  Subjective:   Lots of bleeding around temp R IJ HD cath Still clotting filter with systemic hepain; additon of citrate was not helpful Hb 9.9 K 3.5 on 4K, P 2.0 Remains on DBA, CVP 10 this AM Negative 1.9L yesterday  Objective Vital signs in last 24 hours: Vitals:   12/17/20 0900 12/17/20 0915 12/17/20 0930 12/17/20 0945  BP: 112/66 100/66 110/61 (!) 107/54  Pulse: (!) 109 (!) 110 (!) 110 (!) 107  Resp: (!) 24 (!) 26 (!) 27 (!) 24  Temp:      TempSrc:      SpO2: 100% 100% 100% 100%  Weight:      Height:       Weight change:   Intake/Output Summary (Last 24 hours) at 12/17/2020 1102 Last data filed at 12/17/2020 0700 Gross per 24 hour  Intake 1152.72 ml  Output 2684 ml  Net -1531.28 ml        Labs: Basic Metabolic Panel: Recent Labs  Lab 12/15/20 2203 12/16/20 0437 12/16/20 1323 12/16/20 1504 12/17/20 0419 12/17/20 0500 12/17/20 0503  NA 132* 134* 133*   < > 135 135 134*  K 4.7  4.1 4.5   < > 3.6 3.5 3.5  CL 95* 96* 93*   < > 88* 89* 88*  CO2 25 25 25   --   --  30  --   GLUCOSE 112* 117* 196*   < > 163* 141* 137*  BUN 18 13 15    < > 12 12 12   CREATININE 3.33* 3.07* 2.79*   < > 2.30* 2.56* 2.50*  CALCIUM 8.7* 9.2 8.9  --   --  10.7*  --   PHOS 1.9* 1.5*  --   --   --  2.0*  --    < > = values in this interval not displayed.    Liver Function Tests: Recent Labs  Lab 12/15/20 2203 12/16/20 0437 12/17/20 0500  ALBUMIN 2.9* 3.0* 2.6*    No results for input(s): LIPASE, AMYLASE in the last 168 hours. Recent Labs  Lab 12/13/20 1115  AMMONIA 20    CBC: Recent Labs  Lab 12/12/20 0522 12/13/20 0336 12/14/20 0317 12/15/20 0426 12/16/20 0437 12/16/20 1504 12/17/20 0219 12/17/20 0419 12/17/20 0503  WBC 7.1 8.7 6.9 7.6 8.6  --   --   --   --   HGB 9.7* 8.8* 8.5* 9.0* 8.7*   < > 9.9* 10.2* 9.9*  HCT 31.3* 28.7* 27.2* 28.9* 27.7*   < > 29.0* 30.0* 29.0*  MCV 107.9*  107.9* 105.8* 105.5* 102.6*  --   --   --   --   PLT 259 297 277 323 266  --   --   --   --    < > = values in this interval not displayed.    Cardiac Enzymes: No results for input(s): CKTOTAL, CKMB, CKMBINDEX, TROPONINI in the last 168 hours. CBG: Recent Labs  Lab 12/16/20 0641 12/16/20 1218 12/16/20 1619 12/16/20 2016 12/17/20 0649  GLUCAP 99 127* 94 91 123*     Iron Studies: No results for input(s): IRON, TIBC, TRANSFERRIN, FERRITIN in the last 72 hours. Studies/Results: DG CHEST PORT 1 VIEW  Result Date: 12/16/2020 CLINICAL DATA:  Dyspnea and respiratory abnormalities. EXAM: PORTABLE CHEST 1 VIEW COMPARISON:  12/15/2020 FINDINGS: Left jugular central line is near the upper SVC and pointing towards the right side of the chest. Left jugular central line is stable in position. Again noted is a right jugular dialysis catheter with the tip in the SVC region. Stable cardiomegaly. Limited evaluation of the left lung base due to the cardiomegaly. Otherwise, the lungs are clear.  IMPRESSION: 1. Stable chest radiograph findings. 2. Cardiomegaly without significant lung disease. However, limited evaluation of the left lung base. 3. Stable position of the central lines. Electronically Signed   By: Markus Daft M.D.   On: 12/16/2020 13:26    Medications: Infusions:   prismasol BGK 4/2.5 500 mL/hr at 12/17/20 0848    prismasol BGK 4/2.5 200 mL/hr at 12/17/20 6712   sodium chloride     sodium chloride     sodium chloride     sodium chloride     sodium chloride     sodium chloride     sodium chloride     amiodarone 30 mg/hr (12/17/20 0745)   anticoagulant sodium citrate     DOBUTamine 5 mcg/kg/min (12/17/20 1043)   heparin 1,550 Units/hr (12/17/20 0743)   norepinephrine (LEVOPHED) Adult infusion Stopped (12/13/20 0952)   prismasol BGK 4/2.5 1,500 mL/hr at 12/16/20 2346    Scheduled Medications:  (feeding supplement) PROSource Plus  30 mL Oral BID BM   calcitRIOL  0.25 mcg Oral Daily   Chlorhexidine Gluconate Cloth  6 each Topical Daily   Chlorhexidine Gluconate Cloth  6 each Topical Q0600   clotrimazole  1 application Topical BID   darbepoetin (ARANESP) injection - DIALYSIS  60 mcg Intravenous Q Tue-HD   estradiol  1 mg Oral Daily   febuxostat  40 mg Oral q AM   feeding supplement  237 mL Oral BID BM   ferrous sulfate  325 mg Oral q morning   fluticasone  2 spray Each Nare Daily   fluticasone furoate-vilanterol  1 puff Inhalation Daily   gabapentin  100 mg Oral Daily   insulin aspart  0-6 Units Subcutaneous TID WC   levothyroxine  88 mcg Oral q morning   lidocaine  1 patch Transdermal Q24H   midodrine  15 mg Oral TID WC   multivitamin  1 tablet Oral QHS   predniSONE  5 mg Oral Q breakfast   senna-docusate  2 tablet Oral BID   sodium chloride flush  10-40 mL Intracatheter Q12H   sodium chloride flush  3 mL Intravenous Q12H    have reviewed scheduled and prn medications.  Physical Exam: General: Sitting on chair, not in distress Heart: Irregular heart  rate, s1s2 nl Lungs: Clear anteriorly, no wheezing Abdomen:soft, Non-tender, non-distended Extremities: 2+ LE edema present Dialysis Access: Right IJ temporary  HD catheter in place  Rexene Agent 12/17/2020,11:02 AM  LOS: 27 days

## 2020-12-18 ENCOUNTER — Encounter (HOSPITAL_COMMUNITY): Payer: Self-pay

## 2020-12-18 DIAGNOSIS — Z66 Do not resuscitate: Secondary | ICD-10-CM | POA: Diagnosis not present

## 2020-12-18 DIAGNOSIS — I5023 Acute on chronic systolic (congestive) heart failure: Secondary | ICD-10-CM | POA: Diagnosis not present

## 2020-12-18 DIAGNOSIS — Z515 Encounter for palliative care: Secondary | ICD-10-CM | POA: Diagnosis not present

## 2020-12-18 DIAGNOSIS — N170 Acute kidney failure with tubular necrosis: Secondary | ICD-10-CM | POA: Diagnosis not present

## 2020-12-18 DIAGNOSIS — I4891 Unspecified atrial fibrillation: Secondary | ICD-10-CM | POA: Diagnosis not present

## 2020-12-18 DIAGNOSIS — N184 Chronic kidney disease, stage 4 (severe): Secondary | ICD-10-CM | POA: Diagnosis not present

## 2020-12-18 DIAGNOSIS — R570 Cardiogenic shock: Secondary | ICD-10-CM | POA: Diagnosis not present

## 2020-12-18 DIAGNOSIS — N179 Acute kidney failure, unspecified: Secondary | ICD-10-CM | POA: Diagnosis not present

## 2020-12-18 LAB — RENAL FUNCTION PANEL
Albumin: 2.6 g/dL — ABNORMAL LOW (ref 3.5–5.0)
Anion gap: 16 — ABNORMAL HIGH (ref 5–15)
BUN: 12 mg/dL (ref 8–23)
CO2: 25 mmol/L (ref 22–32)
Calcium: 8.8 mg/dL — ABNORMAL LOW (ref 8.9–10.3)
Chloride: 93 mmol/L — ABNORMAL LOW (ref 98–111)
Creatinine, Ser: 2.61 mg/dL — ABNORMAL HIGH (ref 0.44–1.00)
GFR, Estimated: 20 mL/min — ABNORMAL LOW (ref >60)
Glucose, Bld: 114 mg/dL — ABNORMAL HIGH (ref 70–99)
Phosphorus: 2.6 mg/dL (ref 2.5–4.6)
Potassium: 5 mmol/L (ref 3.5–5.1)
Sodium: 134 mmol/L — ABNORMAL LOW (ref 135–145)

## 2020-12-18 LAB — COOXEMETRY PANEL
Carboxyhemoglobin: 1 % (ref 0.5–1.5)
Methemoglobin: 1 % (ref 0.0–1.5)
O2 Saturation: 38.9 %
Total hemoglobin: 8.1 g/dL — ABNORMAL LOW (ref 12.0–16.0)

## 2020-12-18 LAB — CBC
HCT: 25.2 % — ABNORMAL LOW (ref 36.0–46.0)
Hemoglobin: 7.9 g/dL — ABNORMAL LOW (ref 12.0–15.0)
MCH: 31.9 pg (ref 26.0–34.0)
MCHC: 31.3 g/dL (ref 30.0–36.0)
MCV: 101.6 fL — ABNORMAL HIGH (ref 80.0–100.0)
Platelets: 145 10*3/uL — ABNORMAL LOW (ref 150–400)
RBC: 2.48 MIL/uL — ABNORMAL LOW (ref 3.87–5.11)
RDW: 17.7 % — ABNORMAL HIGH (ref 11.5–15.5)
WBC: 17.4 10*3/uL — ABNORMAL HIGH (ref 4.0–10.5)
nRBC: 1.4 % — ABNORMAL HIGH (ref 0.0–0.2)

## 2020-12-18 LAB — MAGNESIUM: Magnesium: 1.9 mg/dL (ref 1.7–2.4)

## 2020-12-18 LAB — GLUCOSE, CAPILLARY
Glucose-Capillary: 149 mg/dL — ABNORMAL HIGH (ref 70–99)
Glucose-Capillary: 163 mg/dL — ABNORMAL HIGH (ref 70–99)
Glucose-Capillary: 133 mg/dL — ABNORMAL HIGH (ref 70–99)

## 2020-12-18 LAB — HEPARIN LEVEL (UNFRACTIONATED): Heparin Unfractionated: 0.39 IU/mL (ref 0.30–0.70)

## 2020-12-18 LAB — POCT ACTIVATED CLOTTING TIME: Activated Clotting Time: 138 seconds

## 2020-12-18 LAB — LACTIC ACID, PLASMA: Lactic Acid, Venous: 2.2 mmol/L (ref 0.5–1.9)

## 2020-12-18 MED ORDER — FENTANYL CITRATE (PF) 100 MCG/2ML IJ SOLN
25.0000 ug | INTRAMUSCULAR | Status: AC | PRN
  Administered 2020-12-18: 25 ug via INTRAVENOUS
  Administered 2020-12-19: 12.5 ug via INTRAVENOUS
  Filled 2020-12-18 (×2): qty 2

## 2020-12-18 MED ORDER — ACETAMINOPHEN 325 MG PO TABS
650.0000 mg | ORAL_TABLET | Freq: Four times a day (QID) | ORAL | Status: DC | PRN
  Administered 2020-12-19: 650 mg via ORAL
  Filled 2020-12-18: qty 2

## 2020-12-18 MED ORDER — POLYVINYL ALCOHOL 1.4 % OP SOLN
1.0000 [drp] | Freq: Four times a day (QID) | OPHTHALMIC | Status: DC | PRN
  Filled 2020-12-18: qty 15

## 2020-12-18 MED ORDER — ACETAMINOPHEN 650 MG RE SUPP: 650.0000 mg | Freq: Four times a day (QID) | RECTAL | Status: DC | PRN

## 2020-12-18 MED ORDER — BIOTENE DRY MOUTH MT LIQD: 15.0000 mL | OROMUCOSAL | Status: DC | PRN

## 2020-12-18 MED ORDER — GLYCOPYRROLATE 1 MG PO TABS
1.0000 mg | ORAL_TABLET | ORAL | Status: DC | PRN
  Filled 2020-12-18: qty 1

## 2020-12-18 MED ORDER — AMIODARONE LOAD VIA INFUSION
150.0000 mg | Freq: Once | INTRAVENOUS | Status: AC
  Administered 2020-12-18: 150 mg via INTRAVENOUS
  Filled 2020-12-18: qty 83.34

## 2020-12-18 MED ORDER — MORPHINE 100MG IN NS 100ML (1MG/ML) PREMIX INFUSION: 1.0000 mg/h | INTRAVENOUS | Status: DC

## 2020-12-18 MED ORDER — HALOPERIDOL LACTATE 5 MG/ML IJ SOLN
0.5000 mg | INTRAMUSCULAR | Status: DC | PRN
  Administered 2020-12-19: 0.5 mg via INTRAVENOUS
  Filled 2020-12-18: qty 1

## 2020-12-18 MED ORDER — HALOPERIDOL LACTATE 2 MG/ML PO CONC
0.5000 mg | ORAL | Status: DC | PRN
  Filled 2020-12-18: qty 0.3

## 2020-12-18 MED ORDER — ONDANSETRON 4 MG PO TBDP
4.0000 mg | ORAL_TABLET | Freq: Four times a day (QID) | ORAL | Status: DC | PRN
  Filled 2020-12-18: qty 1

## 2020-12-18 MED ORDER — GLYCOPYRROLATE 0.2 MG/ML IJ SOLN: 0.2000 mg | INTRAMUSCULAR | Status: DC | PRN

## 2020-12-18 MED ORDER — ONDANSETRON HCL 4 MG/2ML IJ SOLN
4.0000 mg | Freq: Four times a day (QID) | INTRAMUSCULAR | Status: DC | PRN
  Administered 2020-12-19: 4 mg via INTRAVENOUS

## 2020-12-18 MED ORDER — MORPHINE BOLUS VIA INFUSION
1.0000 mg | INTRAVENOUS | Status: DC | PRN
  Filled 2020-12-18: qty 1

## 2020-12-18 MED ORDER — HALOPERIDOL 0.5 MG PO TABS
0.5000 mg | ORAL_TABLET | ORAL | Status: DC | PRN
  Filled 2020-12-18: qty 1

## 2020-12-18 NOTE — Progress Notes (Signed)
Inpatient Rehabilitation Admissions Coordinator   Noted goals of care discussions. No role for CIR at this it me. We will sign off.  Danne Baxter, RN, MSN Rehab Admissions Coordinator [phone] 12/18/2020 7:14 PM

## 2020-12-18 NOTE — Progress Notes (Signed)
ANTICOAGULATION CONSULT NOTE - Follow-Up Consult  Pharmacy Consult for bivalirudin>>heparin Indication: atrial fibrillation  Patient Measurements: Height: 5' 3.5" (161.3 cm) Weight: 119.9 kg (264 lb 5.3 oz) IBW/kg (Calculated) : 53.55 Heparin Dosing Weight: 85 kg  Vital Signs: BP: 94/55 (08/28 0600) Pulse Rate: 136 (08/28 0439)  Labs: Recent Labs    12/16/20 0437 12/16/20 1323 12/17/20 0500 12/17/20 0503 12/17/20 2158 12/17/20 2207 12/18/20 0342  HGB 8.7*   < >  --  9.9*  --  10.5* 7.9*  HCT 27.7*   < >  --  29.0*  --  31.0* 25.2*  PLT 266  --   --   --   --   --  145*  HEPARINUNFRC 0.35  --  0.38  --   --   --  0.39  CREATININE 3.07*   < > 2.56* 2.50* 2.50*  --  2.61*   < > = values in this interval not displayed.     Estimated Creatinine Clearance: 27.5 mL/min (A) (by C-G formula based on SCr of 2.61 mg/dL (H)).   Assessment: 65 y.o. female with  h/o Afib, DOAC on hold, for heparin.  Pt was on Xarelto PTA and changed to Eliquis due to procedures.  Eliquis 5 mg last given at 2317 7/31.   Hit antibody negative (0.087). 8/17 Discussed with primary team given significant decline in platelet count will go ahead and change over to bival - SRA negative 8/17.   Discussed with team, will change from bivalirudin to heparin infusion given HIT negative and plt recovered.   Heparin level is therapeutic at 0.39, on 1550 units/hr. Hgb dropped down to 7.9, plt trending down again to 145 Had bleeding at HD catheter site - stable now with sutures in place. No infusion issues.  Goal of Therapy:  Heparin infusion: 0.3-0.7 units/mL Monitor platelets by anticoagulation protocol: Yes   Plan:  Continue heparin infusion 1550 units/hr Monitor HL, CBC, and for s/sx of bleeding   Antonietta Jewel, PharmD, [facility] Pharmacist  Phone: [phone] 12/18/2020 7:26 AM  Please check AMION for all [facility] phone numbers After 10:00 PM, call [place] [phone]

## 2020-12-18 NOTE — Progress Notes (Signed)
[facility] PROGRESS NOTE  Assessment/ Plan:  # AKI on CKD4: Sees Webb at [facility].  Secondary to cardiorenal syndrome initially required CRRT after inadequate response to diuretics.  Volume status not controlled with frequent IHD, back on CRRT through the weekend and attempt to achieve euvolemia  Respiratory decompensation in the setting of diminished CVP and improved volume status very worrisome Continue CRRT, 4K, aggressive UF up to 256mL/hr net neg and AHF  assisting; goal CVP < 10; inc'd pre fluids to attempt to lessen clogging.   Citrate inffective for clotting, sig more bleeding around HD cath; stop today, continue systemic heparin Replete phosphorus today Agree with ongoing discussion regarding goals of care   #Prolonged acute exacerbation of congestive heart failure: Remains decompensated. on dobutamine for inotropic support; improving CVPs, persistent low Co-O2.  Midodrine 15 mg TID.  AHF managing  # Atrial fibrillation with rapid ventricular response: On amiodarone.  Bivalidrudin.   # Hyponatremia: 2/2 Free water excess and low GFR. Trend with HD. Cont fluid restriction as able.  Mild, stable  # Anemia of chronic disease/chronic kidney disease: Cont ESA.  12/04/20 TSAT 72%  Subjective:  Respiratory distress overnight requiring BiPAP Continues to clot CVVHD catheter frequently despite attempted citrate (stopped) and ongoing heparin CVP is lower but mixed venous O2 very low also Remains on DBA Daughter at bedside, updated Negative 2.0L yesterday; weights down  Objective Vital signs in last 24 hours: Vitals:   12/18/20 0855 12/18/20 0900 12/18/20 0901 12/18/20 0930  BP: (!) 81/67 (!) 121/110 (!) 81/67 105/84  Pulse: (!) 129   (!) 128  Resp: (!) 26 (!) 23 (!) 23 (!) 22  Temp:      TempSrc:      SpO2: 100%   100%  Weight:      Height:       Weight change:   Intake/Output Summary (Last 24 hours) at 12/18/2020 1019 Last data filed at 12/18/2020  1000 Gross per 24 hour  Intake 1750.29 ml  Output 4300 ml  Net -2549.71 ml        Labs: Basic Metabolic Panel: Recent Labs  Lab 12/16/20 0437 12/16/20 1323 12/17/20 0500 12/17/20 0503 12/17/20 2158 12/17/20 2207 12/18/20 0342  NA 134*   < > 135 134* 134* 134* 134*  K 4.1   < > 3.5 3.5 5.1 5.1 5.0  CL 96*   < > 89* 88* 95*  --  93*  CO2 25   < > 30  --  28  --  25  GLUCOSE 117*   < > 141* 137* 136*  --  114*  BUN 13   < > 12 12 12   --  12  CREATININE 3.07*   < > 2.56* 2.50* 2.50*  --  2.61*  CALCIUM 9.2   < > 10.7*  --  8.8*  --  8.8*  PHOS 1.5*  --  2.0*  --   --   --  2.6   < > = values in this interval not displayed.    Liver Function Tests: Recent Labs  Lab 12/16/20 0437 12/17/20 0500 12/18/20 0342  ALBUMIN 3.0* 2.6* 2.6*    No results for input(s): LIPASE, AMYLASE in the last 168 hours. Recent Labs  Lab 12/13/20 1115  AMMONIA 20    CBC: Recent Labs  Lab 12/13/20 0336 12/14/20 0317 12/15/20 0426 12/16/20 0437 12/16/20 1504 12/17/20 0503 12/17/20 2207 12/18/20 0342  WBC 8.7 6.9 7.6 8.6  --   --   --  17.4*  HGB 8.8* 8.5* 9.0* 8.7*   < > 9.9* 10.5* 7.9*  HCT 28.7* 27.2* 28.9* 27.7*   < > 29.0* 31.0* 25.2*  MCV 107.9* 105.8* 105.5* 102.6*  --   --   --  101.6*  PLT 297 277 323 266  --   --   --  145*   < > = values in this interval not displayed.    Cardiac Enzymes: No results for input(s): CKTOTAL, CKMB, CKMBINDEX, TROPONINI in the last 168 hours. CBG: Recent Labs  Lab 12/17/20 0649 12/17/20 1151 12/17/20 1603 12/17/20 2125 12/18/20 0754  GLUCAP 123* 116* 138* 120* 149*     Iron Studies: No results for input(s): IRON, TIBC, TRANSFERRIN, FERRITIN in the last 72 hours. Studies/Results: DG CHEST PORT 1 VIEW  Result Date: 12/17/2020 CLINICAL DATA:  Shortness of breath. EXAM: PORTABLE CHEST 1 VIEW COMPARISON:  12/16/2020 FINDINGS: Lungs are clear. No frank interstitial edema. No pleural effusion or pneumothorax. Overlying soft tissue  obscures the left lung base, which is favored to be clear. Cardiomegaly. Right IJ dual lumen catheter terminating at the cavoatrial junction. Left IJ catheter terminating in the mid SVC. IMPRESSION: Cardiomegaly.  No frank interstitial edema. Electronically Signed   By: Julian Hy M.D.   On: 12/17/2020 22:05   DG CHEST PORT 1 VIEW  Result Date: 12/16/2020 CLINICAL DATA:  Dyspnea and respiratory abnormalities. EXAM: PORTABLE CHEST 1 VIEW COMPARISON:  12/15/2020 FINDINGS: Left jugular central line is near the upper SVC and pointing towards the right side of the chest. Left jugular central line is stable in position. Again noted is a right jugular dialysis catheter with the tip in the SVC region. Stable cardiomegaly. Limited evaluation of the left lung base due to the cardiomegaly. Otherwise, the lungs are clear. IMPRESSION: 1. Stable chest radiograph findings. 2. Cardiomegaly without significant lung disease. However, limited evaluation of the left lung base. 3. Stable position of the central lines. Electronically Signed   By: Markus Daft M.D.   On: 12/16/2020 13:26    Medications: Infusions:   prismasol BGK 4/2.5 1,000 mL/hr at 12/18/20 1005    prismasol BGK 4/2.5 200 mL/hr at 12/18/20 0126   sodium chloride     sodium chloride     sodium chloride     sodium chloride     sodium chloride     sodium chloride     sodium chloride     amiodarone 30 mg/hr (12/18/20 1000)   anticoagulant sodium citrate     DOBUTamine 5 mcg/kg/min (12/18/20 1000)   heparin 1,550 Units/hr (12/18/20 1000)   norepinephrine (LEVOPHED) Adult infusion Stopped (12/13/20 0952)   prismasol BGK 4/2.5 1,500 mL/hr at 12/18/20 6720    Scheduled Medications:  (feeding supplement) PROSource Plus  30 mL Oral BID BM   calcitRIOL  0.25 mcg Oral Daily   Chlorhexidine Gluconate Cloth  6 each Topical Daily   Chlorhexidine Gluconate Cloth  6 each Topical Q0600   clotrimazole  1 application Topical BID   darbepoetin (ARANESP)  injection - DIALYSIS  60 mcg Intravenous Q Tue-HD   estradiol  1 mg Oral Daily   febuxostat  40 mg Oral q AM   feeding supplement  237 mL Oral BID BM   ferrous sulfate  325 mg Oral q morning   fluticasone  2 spray Each Nare Daily   fluticasone furoate-vilanterol  1 puff Inhalation Daily   gabapentin  100 mg Oral Daily   insulin aspart  0-6 Units Subcutaneous  TID WC   levothyroxine  88 mcg Oral q morning   lidocaine  1 patch Transdermal Q24H   midodrine  15 mg Oral TID WC   multivitamin  1 tablet Oral QHS   predniSONE  5 mg Oral Q breakfast   senna-docusate  2 tablet Oral BID   sodium chloride flush  10-40 mL Intracatheter Q12H   sodium chloride flush  3 mL Intravenous Q12H    have reviewed scheduled and prn medications.  Physical Exam: General: Lying in bed on BiPAP, able to write and converse Heart: Irregular heart rate, s1s2 nl Lungs: Clear anteriorly, no wheezing Abdomen:soft, Non-tender, non-distended Extremities: 2+ LE edema present Dialysis Access: Right IJ temporary HD catheter in place  Siraj Dermody B Skyeler Scalese 12/18/2020,10:19 AM  LOS: 28 days

## 2020-12-18 NOTE — Progress Notes (Signed)
While rounding on [place] had a chance meeting with pt's adult children who explained there was going to be a 3pm family consult with pt's physician.  Family requested Chaplain return at that time to be present for family meeting.  Chaplain arrived prior to 3pm and prayed at bedside with family gathered around the pt's bed. Pt was alert and very clear she wanted all her family members there when the doctor arrived.  Chaplain prayed per pt's wishes for wisdom, answers, guidance, courage and thanksgiving.  Chaplain prayed at bedside.  Chaplain checked with daughter to determine there was no need for Chaplain to stay for physician conference.    Chaplain available for further attention to this family if needed.  [place]

## 2020-12-18 NOTE — Progress Notes (Signed)
Patient ID: Kathline Banbury, female   DOB: 24-Sep-1955, 65 y.o.   MRN: [id number]     Advanced Heart Failure Rounding Note  PCP-Cardiologist: Shelva Majestic, MD  HF clinic: Dr. Haroldine Laws  Subjective:    Events: - 8/1 A fib RVR & A/C systolic heart faiure --> cardiogenic shock. Had urgent DC-CV with brief conversion to NSR but went back in A fib. . On Norepi + milrinone. AKI. Nephrology consulted.  - 8/2 A fib RVR, amio gtt. Continued on milrinone 0.375 mcg + norepi 2 mcg. Started on prednisone 40 mg daily x3 days for gout flare.  - 8/3 did not tolerate milrinone wean w/ drop in Co-ox 68%--->48% and decrease in UOP. Milrinone increased back to 0.375.  Lasix gtt increased to 30/hr.  - 8/4 NE added back given persistently low Co-ox - 8/4 s/p TEE/DCCV>>NSR  - 8/5 back in Afib w/ RVR  -  08/09 HD cath placed, CRRT started - 8/10: conversion to sinus tachycardia  - 8/13 CRRT stopped - 8/15 recurrent AF - 8/16 tolerated iHD - 8/18 tolerated iHD  - 8/19 DCCV to NSR - 8/22 DBA increased to 5  - 8/25 Had iHD/ Restarted CVVHD   Remains on CVVHD and DBA 5. CVVHD clotted several times yesterday. Developed near respiratory arrest last night. Placed on Bipap.  Lactic acid 5.9 overnight -> 2.2 this am.   Co-ox 39%  Says she feels better this am. Denies SOB, orthopnea or PND. Back in AF 130s. CVP 11   Objective:   Weight Range: 119.9 kg Body mass index is 46.09 kg/m.   Vital Signs:   Temp:  [98.1 F (36.7 C)-98.3 F (36.8 C)] 98.3 F (36.8 C) (08/28 0800) Pulse Rate:  [38-145] 128 (08/28 0930) Resp:  [13-36] 22 (08/28 0930) BP: (72-191)/(39-161) 105/84 (08/28 0930) SpO2:  [96 %-100 %] 100 % (08/28 0930) FiO2 (%):  [50 %-75 %] 50 % (08/28 0855) Last BM Date: 12/18/20  Weight change: Filed Weights   12/15/20 1145 12/15/20 1530 12/16/20 0600  Weight: 120.8 kg 121.6 kg 119.9 kg    Intake/Output:   Intake/Output Summary (Last 24 hours) at 12/18/2020 1110 Last data filed at  12/18/2020 1000 Gross per 24 hour  Intake 1708.99 ml  Output 4285 ml  Net -2576.01 ml      Physical Exam   General:  Sitting up. No resp difficulty HEENT: normal Neck: supple. RIJ HD cath Carotids 2+ bilat; no bruits. No lymphadenopathy or thryomegaly appreciated. Cor: PMI nondisplaced. Irregular tachy No rubs, gallops or murmurs. Lungs: clear Abdomen: obese soft, nontender, nondistended. No hepatosplenomegaly. No bruits or masses. Good bowel sounds. Extremities: no cyanosis, clubbing, rash, 1+ edema + UNNA Neuro: alert & orientedx3, cranial nerves grossly intact. moves all 4 extremities w/o difficulty. Affect pleasant  Telemetry   AF 130s Personally reviewed  Labs    CBC Recent Labs    12/16/20 0437 12/16/20 1504 12/17/20 2207 12/18/20 0342  WBC 8.6  --   --  17.4*  HGB 8.7*   < > 10.5* 7.9*  HCT 27.7*   < > 31.0* 25.2*  MCV 102.6*  --   --  101.6*  PLT 266  --   --  145*   < > = values in this interval not displayed.    Basic Metabolic Panel Recent Labs    12/17/20 0500 12/17/20 0503 12/17/20 2158 12/17/20 2207 12/18/20 0342  NA 135   < > 134* 134* 134*  K 3.5   < >  5.1 5.1 5.0  CL 89*   < > 95*  --  93*  CO2 30  --  28  --  25  GLUCOSE 141*   < > 136*  --  114*  BUN 12   < > 12  --  12  CREATININE 2.56*   < > 2.50*  --  2.61*  CALCIUM 10.7*  --  8.8*  --  8.8*  MG 1.9  --  1.9  --  1.9  PHOS 2.0*  --   --   --  2.6   < > = values in this interval not displayed.    Liver Function Tests Recent Labs    12/17/20 0500 12/18/20 0342  ALBUMIN 2.6* 2.6*     No results for input(s): LIPASE, AMYLASE in the last 72 hours. Cardiac Enzymes No results for input(s): CKTOTAL, CKMB, CKMBINDEX, TROPONINI in the last 72 hours.  BNP: BNP (last 3 results) Recent Labs    11/20/20 0318 11/21/20 0258  BNP 2,051.3* 2,022.4*     ProBNP (last 3 results) No results for input(s): PROBNP in the last 8760 hours.   D-Dimer No results for input(s): DDIMER in  the last 72 hours. Hemoglobin A1C No results for input(s): HGBA1C in the last 72 hours.  Fasting Lipid Panel No results for input(s): CHOL, HDL, LDLCALC, TRIG, CHOLHDL, LDLDIRECT in the last 72 hours. Thyroid Function Tests No results for input(s): TSH, T4TOTAL, T3FREE, THYROIDAB in the last 72 hours.  Invalid input(s): FREET3   Other results:   Imaging    DG CHEST PORT 1 VIEW  Result Date: 12/17/2020 CLINICAL DATA:  Shortness of breath. EXAM: PORTABLE CHEST 1 VIEW COMPARISON:  12/16/2020 FINDINGS: Lungs are clear. No frank interstitial edema. No pleural effusion or pneumothorax. Overlying soft tissue obscures the left lung base, which is favored to be clear. Cardiomegaly. Right IJ dual lumen catheter terminating at the cavoatrial junction. Left IJ catheter terminating in the mid SVC. IMPRESSION: Cardiomegaly.  No frank interstitial edema. Electronically Signed   By: Julian Hy M.D.   On: 12/17/2020 22:05     Medications:     Scheduled Medications:  (feeding supplement) PROSource Plus  30 mL Oral BID BM   calcitRIOL  0.25 mcg Oral Daily   Chlorhexidine Gluconate Cloth  6 each Topical Daily   Chlorhexidine Gluconate Cloth  6 each Topical Q0600   clotrimazole  1 application Topical BID   darbepoetin (ARANESP) injection - DIALYSIS  60 mcg Intravenous Q Tue-HD   estradiol  1 mg Oral Daily   febuxostat  40 mg Oral q AM   feeding supplement  237 mL Oral BID BM   ferrous sulfate  325 mg Oral q morning   fluticasone  2 spray Each Nare Daily   fluticasone furoate-vilanterol  1 puff Inhalation Daily   gabapentin  100 mg Oral Daily   insulin aspart  0-6 Units Subcutaneous TID WC   levothyroxine  88 mcg Oral q morning   lidocaine  1 patch Transdermal Q24H   midodrine  15 mg Oral TID WC   multivitamin  1 tablet Oral QHS   predniSONE  5 mg Oral Q breakfast   senna-docusate  2 tablet Oral BID   sodium chloride flush  10-40 mL Intracatheter Q12H   sodium chloride flush  3 mL  Intravenous Q12H    Infusions:   prismasol BGK 4/2.5 1,000 mL/hr at 12/18/20 1005    prismasol BGK 4/2.5 200 mL/hr at 12/18/20 0126  sodium chloride     sodium chloride     sodium chloride     sodium chloride     sodium chloride     sodium chloride     sodium chloride     amiodarone 30 mg/hr (12/18/20 1000)   anticoagulant sodium citrate     DOBUTamine 5 mcg/kg/min (12/18/20 1000)   heparin 1,550 Units/hr (12/18/20 1000)   norepinephrine (LEVOPHED) Adult infusion Stopped (12/13/20 0952)   prismasol BGK 4/2.5 1,500 mL/hr at 12/18/20 6063    PRN Medications: sodium chloride, sodium chloride, sodium chloride, sodium chloride, sodium chloride, sodium chloride, sodium chloride, acetaminophen, albuterol, alteplase, alteplase, anticoagulant sodium citrate, calcium carbonate, cyclobenzaprine, fentaNYL (SUBLIMAZE) injection, heparin, heparin, HYDROcodone-acetaminophen, hydrocortisone cream, lidocaine (PF), lidocaine (PF), lidocaine-prilocaine, lidocaine-prilocaine, ondansetron (ZOFRAN) IV, oxyCODONE, pentafluoroprop-tetrafluoroeth, pentafluoroprop-tetrafluoroeth, prochlorperazine, sodium chloride, sodium chloride flush, sodium chloride flush    Patient Profile   Ms Keng is a 65 year old with a history of chronic systolic hf previously EF 45%,  NICM, hypothyroid, PAF, gout, OSA, CKD Stage IV, HTN, hyperlipidemia, and lupus.   Admitted with A fib RVR and A/C systolic heart failure complicated by cardiogenic shock.     Assessment/Plan   1. Acute/Chronic Systolic Heart Failure -> Cardiogenic shock in setting AF with RVR - 8/1 Lactic acid 1.5. CO-OX 43%.--> started on milrinone and NE - Echo 7/22 EF 30-35% (2021)  This admit -> 25-30%. - NE stopped 8/12, then resumed briefly the morning of 8/15 -- later weaned off.  - CVVHD stopped 8/13 -> iHD. CVVHD restarted on 8/25 to get fluid down. - Unable to tolerate GDMT due to hypotension and AKI - Continue midodrine 15 mg TID  - Remains on  DBA 5. Co-ox 52%- > 39% - She is truly end-stage with persistent cardiogenic shock. Unable to tolerate CVVHD well and had near arrest last night. I again had long talk with her and her 2 daughters and informed them that I do not think there is anyway to successfully get her onto iHD and off pressors given her heart's condition. We discussed transition to comfort care as well as code status but she and her family have asked for more time on CVVHD. I told them I would be willing to consider 2-3 more days (as tolerated) but we would have to set firm deadline to stop if no improvement and would also have to reconsider code status given that cardiogenic shock really the major issue now and she is at high risk for decompensation and recurrent suffering like she experienced last night. Given lack of durable HF options, I do not think there is any role for escalating inotropic support. We will have family meeting at 3p with Palliative Care to re-address.   2. AFL with RVR - Developed shock and had urgent cardioversion 8/1 with conversion to NSR w/ ERAF - Repeat DCCV 8/4 w/ ERAF. Converted to sinus tachycardia on 8/10 with IV amio - Afib again starting 8/17, DCCV on 8/19.  - Back in AF overnight 8/25. Converted on amio - Seen by EP. Felt not to be a candidate for ablation. Not candidate for AVN ablation and CRT due to need for HD and risk of device infection - Now with recurrent AF despite amio gtt. Will bolus amio   3. 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 Gout -  Uric acid of 10. Pain RLE that was a questionable flare.  Now resolved  - On uloric 40 mg daily  - Completed 3 day course of prednisone    5. Hypokalemia -- now resolved - K 5.0 today   6. Lupus - on low-dose prednisone.  7. Thrombocytopenia - Plt count of 121 -> 104 -- >62 switched from heparin drip to  bival, but negative for HIT -- heparin resumed on 8/24  - PLTs 145k on heparin  8. Hypervolemic Hyponatremia - improving w/ HD - Na 134 today   CRITICAL CARE Performed by: Glori Bickers  Total critical care time: 50 minutes  Critical care time was exclusive of separately billable procedures and treating other patients.  Critical care was necessary to treat or prevent imminent or life-threatening deterioration.  Critical care was time spent personally by me (independent of midlevel providers or residents) on the following activities: development of treatment plan with patient and/or surrogate as well as nursing, discussions with consultants, evaluation of patient's response to treatment, examination of patient, obtaining history from patient or surrogate, ordering and performing treatments and interventions, ordering and review of laboratory studies, ordering and review of radiographic studies, pulse oximetry and re-evaluation of patient's condition.    Glori Bickers, MD  11:10 AM 12/18/2020

## 2020-12-18 NOTE — Progress Notes (Signed)
RN given verbal orders to remove HD Trialysis cath per Dr Renella Cunas.

## 2020-12-18 NOTE — Significant Event (Signed)
Pt back to bed after using BSC became SOB, with increasing respirator rate, heart rate and blood pressure. This RN placed pt on non rebreather, called charge nurse and RT. Cardiology paged, MD to bedside. Pt placed on Bipap, CXR obtained.      Valente David, RN

## 2020-12-18 NOTE — Progress Notes (Signed)
Patient ID: Caitlyn Fuentes, female   DOB: 1955/12/24, 65 y.o.   MRN: [id number]    Progress Note from the Palliative Medicine Team at [facility]   Patient Name: Caitlyn Fuentes        Date: 12/18/2020 DOB: Aug 14, 1955  Age: 65 y.o. MRN#: [id number] Attending Physician: Jolaine Artist, MD Primary Care Physician: Guadlupe Spanish, MD Admit Date: 11/20/2020   Medical records reviewed   65 y.o. female  with past medical history of PAF on systemic anticoagulation, chronic systolic CHF secondary to nonischemic cardiomyopathy, CKD stage IV, SLE on chronic steroids, HTN, COPD, pulmonary hypertension, gout, hypothyroidism admitted on 11/20/2020 with worsening of palpitations and shortness of breath and leg swelling.   Patient was found to have afib with RVR, acute on chronic systolic heart failure, cardiogenic shock, and AKI due to cardiorenal syndrome requiring CRRT. She is now off pressors and undergoing trials of iHD. Not a candidate for advanced HF therapies or ablation. Palliative medicine has been consulted to assist with goals of care conversation.  Continued decline,   cardiogenic shock as well as renal failure and spite of full medical support.   Not a candidate for HD.    Large family gathered at bedside for scheduled family meeting to discuss this current medical situation.  Dr. Haroldine Laws updated patient and family on the seriousness of the current medical situation.  Questions and concerns addressed.  Education offered regarding the difference between aggressive medical intervention path and a palliative comfort path for this patient at this time in this situation. Education offered on the natural trajectory and expectations at end-of-life.  Education offered on symptom management strategies to enhance comfort and dignity at end-of-life. Education offered on hospice benefit, home hospice versus residential hospice versus hospital death.    Patient and family have made decision that  home for end-of-life care would not be optimal  Created space and opportunity for patient and family to explore the thoughts and feelings regarding current medical situation.  Patient is able to communicate to her family her sense of peace and acceptance regarding her current medical situation and her hope for her family to be at peace too.  Plan of Care: -DNR/DNI -Continue current medical interventions over the next 24 to 48 hours, Ms Vanloan is not able today to make a full shift to comfort at this time.  However both patient and family understand the likelihood that her body will continue to fail to thrive and a shift to comfort will need to be made.  Ultimately patient shares with her family "I do not want to suffer".  -PMT will continue to support holistically and help patient and family navigate decisions as they arise  over the next several days.  Discussed with patient the importance of continued conversation with her family and the  medical providers regarding overall plan of care and treatment options,  ensuring decisions are within the context of the patients values and GOCs.  Questions and concerns addressed   Discussed with Dr Haroldine Laws and bedside RN   Total time spent on the unit was 90 minutes  Greater than 50% of the time was spent in counseling and coordination of care  Wadie Lessen NP  Palliative Medicine Team Team Phone # [phone] Pager [phone]

## 2020-12-18 NOTE — Progress Notes (Signed)
RN noted air being pulled into the CRRT circuit from [employer] on Trialysis Catheter. RN called Dr Renella Cunas to the bedside to assess the catheter. Dr Renella Cunas believes that there is either a leak or fracture in the catheter line pulling air into the CRRT circuit. At this time pt is not wanting another catheter therefore CRRT is being stopped. After speaking with the patient and her family, patient has decided that she is ready to go comfort care.

## 2020-12-18 NOTE — Progress Notes (Signed)
  Extensive discussion with patient and multiple family members about her current condition.  We discussed the fact that she is currently in cardiogenic shock as well as renal failure and that she is struggling to tolerate CVVHD and would not be candidate for intermittent HD.   We discussed the fact the her current situation is not survivable despite the multiple interventions already attempted as she is currently completely dependent on inotropes and CVVHD and is not a candidate for advanced HF therapies or long-term HD. We also discussed that she is at very high risk for further acute decompensations like last night which could cause significant pain and suffering.   I have strongly suggested that we agree on a plan for end-of-life care and also to switch her to DNR/DNI.   The patient and family members asked multiple questions including asking if she could be transferred for a second opinion. I informed them that she has been evaluated by multiple physicians at our center who are in agreement that there are no other options to improve her situation. I also let them know that I would be happy to talk to any physicians from other facilities by phone but she was likely too sick to transfer safely and that most institutions would not accept an ICU patient who was at end-of-life without any real options for recovery.   Patient expressed interest in possibly going home with [facility].   Palliative Care present for entire conversation and will continue discussions with patient and her family about options.   Additional CCT 40 mins.   Glori Bickers, MD  3:49 PM

## 2020-12-19 DIAGNOSIS — N179 Acute kidney failure, unspecified: Secondary | ICD-10-CM | POA: Diagnosis not present

## 2020-12-19 DIAGNOSIS — I4891 Unspecified atrial fibrillation: Secondary | ICD-10-CM | POA: Diagnosis not present

## 2020-12-19 DIAGNOSIS — I5023 Acute on chronic systolic (congestive) heart failure: Secondary | ICD-10-CM | POA: Diagnosis not present

## 2020-12-19 DIAGNOSIS — R531 Weakness: Secondary | ICD-10-CM

## 2020-12-19 DIAGNOSIS — R0689 Other abnormalities of breathing: Secondary | ICD-10-CM

## 2020-12-19 DIAGNOSIS — N184 Chronic kidney disease, stage 4 (severe): Secondary | ICD-10-CM | POA: Diagnosis not present

## 2020-12-19 DIAGNOSIS — N170 Acute kidney failure with tubular necrosis: Secondary | ICD-10-CM | POA: Diagnosis not present

## 2020-12-19 DIAGNOSIS — R0600 Dyspnea, unspecified: Secondary | ICD-10-CM | POA: Diagnosis not present

## 2020-12-19 DIAGNOSIS — Z515 Encounter for palliative care: Secondary | ICD-10-CM | POA: Diagnosis not present

## 2020-12-19 LAB — CALCIUM, IONIZED: Calcium, Ionized, Serum: 3.4 mg/dL — ABNORMAL LOW (ref 4.5–5.6)

## 2020-12-19 MED ORDER — HALOPERIDOL 0.5 MG PO TABS: 0.5000 mg | ORAL_TABLET | ORAL | Status: AC | PRN

## 2020-12-19 MED ORDER — MIDODRINE HCL 5 MG PO TABS: 15.0000 mg | ORAL_TABLET | Freq: Three times a day (TID) | ORAL | Status: AC

## 2020-12-19 MED ORDER — PREDNISONE 5 MG PO TABS: 5.0000 mg | ORAL_TABLET | Freq: Every day | ORAL | Status: AC

## 2020-12-19 MED ORDER — LORAZEPAM 2 MG/ML IJ SOLN: 0.5000 mg | INTRAMUSCULAR | 0 refills | Status: AC | PRN

## 2020-12-19 MED ORDER — TORSEMIDE 100 MG PO TABS: 100.0000 mg | ORAL_TABLET | Freq: Two times a day (BID) | ORAL | Status: AC

## 2020-12-19 MED ORDER — TORSEMIDE 20 MG PO TABS
100.0000 mg | ORAL_TABLET | Freq: Two times a day (BID) | ORAL | Status: DC
  Administered 2020-12-19: 100 mg via ORAL
  Filled 2020-12-19: qty 5

## 2020-12-19 MED ORDER — LORAZEPAM 2 MG/ML IJ SOLN
0.5000 mg | INTRAMUSCULAR | Status: DC | PRN
  Administered 2020-12-19 – 2020-12-20 (×2): 0.5 mg via INTRAVENOUS
  Filled 2020-12-19 (×3): qty 1

## 2020-12-19 NOTE — Progress Notes (Addendum)
Manufacturing engineer [facility]) Hospital Liaison note.     Received request from [place] for family interest in [facility]. Patient information has been forwarded to [facility] for review.  [place] liaison will follow up once eligibility and availability have been determined.   Please do not hesitate to call with questions.   Thank you,   Farrel Gordon, RN, [place] Hospital Liaison  [phone]

## 2020-12-19 NOTE — Progress Notes (Signed)
Manufacturing engineer [facility]) Hospital Liaison note.     Consents are complete at this time for transfer to [facility].  RN please call report to [phone].   Thank you,     Domenic Moras, BSN, RN [facility] Liaison  [phone]

## 2020-12-19 NOTE — Progress Notes (Signed)
Family at bedside. NAD noted at this time.

## 2020-12-19 NOTE — Progress Notes (Signed)
RN d/c heparin and amio drip now, and will d/c dobutamine drip once family is around per Renella Cunas, MD orders.

## 2020-12-19 NOTE — Progress Notes (Signed)
Family does not want to take patient tonight to [facility] and will not sign paper work. Will keep overnight. Palliative care aware of this.

## 2020-12-19 NOTE — Progress Notes (Signed)
Spoke with Trixie Dredge, Hospice liason.   Accepted at [facility] and will have bed later today.   Transport to [facility] with central line via [place].   I spoke to Mrs Patty and multiple family members. All agreeable with plan   Jack Bolio NP-C  4:19 PM

## 2020-12-19 NOTE — Progress Notes (Signed)
Caitlyn Fuentes has decided to transition to comfort care.  HD catheter removed last night and CRRT discontinued.  Nothing further to add and will sign off.  Please call with questions or concerns.

## 2020-12-19 NOTE — Progress Notes (Addendum)
Patient ID: Caitlyn Fuentes, female   DOB: 06-Aug-1955, 65 y.o.   MRN: [id number]     Advanced Heart Failure Rounding Note  PCP-Cardiologist: Shelva Majestic, MD  HF clinic: Dr. Haroldine Laws  Subjective:    Events: - 8/1 A fib RVR & A/C systolic heart faiure --> cardiogenic shock. Had urgent DC-CV with brief conversion to NSR but went back in A fib. . On Norepi + milrinone. AKI. Nephrology consulted.  - 8/2 A fib RVR, amio gtt. Continued on milrinone 0.375 mcg + norepi 2 mcg. Started on prednisone 40 mg daily x3 days for gout flare.  - 8/3 did not tolerate milrinone wean w/ drop in Co-ox 68%--->48% and decrease in UOP. Milrinone increased back to 0.375.  Lasix gtt increased to 30/hr.  - 8/4 NE added back given persistently low Co-ox - 8/4 s/p TEE/DCCV>>NSR  - 8/5 back in Afib w/ RVR  -  08/09 HD cath placed, CRRT started - 8/10: conversion to sinus tachycardia  - 8/13 CRRT stopped - 8/15 recurrent AF - 8/16 tolerated iHD - 8/18 tolerated iHD  - 8/19 DCCV to NSR - 8/22 DBA increased to 5  - 8/25 Had iHD/ Restarted CVVHD  -8/28 Stopped CVVHD and switched to comfort care. Labs stopped.   Dobutamine cut down to 2.5 mcg.   No complaints. Denies SOB.   Objective:   Weight Range: 119.9 kg Body mass index is 46.09 kg/m.   Vital Signs:   Temp:  [98 F (36.7 C)-98.5 F (36.9 C)] 98 F (36.7 C) (08/29 0415) Pulse Rate:  [53-129] 91 (08/29 0610) Resp:  [13-37] 19 (08/29 0610) BP: (71-253)/(44-234) 84/56 (08/29 0605) SpO2:  [78 %-100 %] 100 % (08/29 0610) FiO2 (%):  [50 %-65 %] 50 % (08/28 0855) Last BM Date: 12/18/20  Weight change: Filed Weights   12/15/20 1145 12/15/20 1530 12/16/20 0600  Weight: 120.8 kg 121.6 kg 119.9 kg    Intake/Output:   Intake/Output Summary (Last 24 hours) at 12/19/2020 0750 Last data filed at 12/19/2020 0600 Gross per 24 hour  Intake 947.71 ml  Output 1807 ml  Net -859.29 ml     Physical Exam   General:  Sitting in the chair.  No resp  difficulty HEENT: normal Neck: supple. JVP difficulty to assess.  Carotids 2+ bilat; no bruits. No lymphadenopathy or thryomegaly appreciated. Cor: PMI nondisplaced. Tachy Regular rate & rhythm. No rubs, gallops or murmurs. Lungs: clear on CPAP Abdomen: soft, nontender, nondistended. No hepatosplenomegaly. No bruits or masses. Good bowel sounds. Extremities: no cyanosis, clubbing, rash, edema Neuro: alert & orientedx3, cranial nerves grossly intact. moves all 4 extremities w/o difficulty. Affect pleasant  Telemetry  Sinus Tach 100s.   Labs    CBC Recent Labs    12/17/20 2207 12/18/20 0342  WBC  --  17.4*  HGB 10.5* 7.9*  HCT 31.0* 25.2*  MCV  --  101.6*  PLT  --  270*   Basic Metabolic Panel Recent Labs    12/17/20 0500 12/17/20 0503 12/17/20 2158 12/17/20 2207 12/18/20 0342  NA 135   < > 134* 134* 134*  K 3.5   < > 5.1 5.1 5.0  CL 89*   < > 95*  --  93*  CO2 30  --  28  --  25  GLUCOSE 141*   < > 136*  --  114*  BUN 12   < > 12  --  12  CREATININE 2.56*   < > 2.50*  --  2.61*  CALCIUM 10.7*  --  8.8*  --  8.8*  MG 1.9  --  1.9  --  1.9  PHOS 2.0*  --   --   --  2.6   < > = values in this interval not displayed.   Liver Function Tests Recent Labs    12/17/20 0500 12/18/20 0342  ALBUMIN 2.6* 2.6*    No results for input(s): LIPASE, AMYLASE in the last 72 hours. Cardiac Enzymes No results for input(s): CKTOTAL, CKMB, CKMBINDEX, TROPONINI in the last 72 hours.  BNP: BNP (last 3 results) Recent Labs    11/20/20 0318 11/21/20 0258  BNP 2,051.3* 2,022.4*    ProBNP (last 3 results) No results for input(s): PROBNP in the last 8760 hours.   D-Dimer No results for input(s): DDIMER in the last 72 hours. Hemoglobin A1C No results for input(s): HGBA1C in the last 72 hours.  Fasting Lipid Panel No results for input(s): CHOL, HDL, LDLCALC, TRIG, CHOLHDL, LDLDIRECT in the last 72 hours. Thyroid Function Tests No results for input(s): TSH, T4TOTAL, T3FREE,  THYROIDAB in the last 72 hours.  Invalid input(s): FREET3   Other results:   Imaging    No results found.   Medications:     Scheduled Medications:  (feeding supplement) PROSource Plus  30 mL Oral BID BM   gabapentin  100 mg Oral Daily   lidocaine  1 patch Transdermal Q24H   midodrine  15 mg Oral TID WC   predniSONE  5 mg Oral Q breakfast   senna-docusate  2 tablet Oral BID    Infusions:  DOBUTamine 2.5 mcg/kg/min (12/19/20 0600)   morphine      PRN Medications: acetaminophen **OR** acetaminophen, albuterol, fentaNYL (SUBLIMAZE) injection, glycopyrrolate **OR** glycopyrrolate **OR** glycopyrrolate, haloperidol **OR** haloperidol **OR** haloperidol lactate, HYDROcodone-acetaminophen, lidocaine-prilocaine, lidocaine-prilocaine, LORazepam, morphine, ondansetron (ZOFRAN) IV, ondansetron **OR** ondansetron (ZOFRAN) IV, oxyCODONE, pentafluoroprop-tetrafluoroeth, polyvinyl alcohol, prochlorperazine    Patient Profile   Caitlyn Fuentes is a 65 year old with a history of chronic systolic hf previously EF 45%,  NICM, hypothyroid, PAF, gout, OSA, CKD Stage IV, HTN, hyperlipidemia, and lupus.   Admitted with A fib RVR and A/C systolic heart failure complicated by cardiogenic shock.     Assessment/Plan   1. Acute/Chronic Systolic Heart Failure -> Cardiogenic shock in setting AF with RVR - 8/1 Lactic acid 1.5. CO-OX 43%.--> started on milrinone and NE - Echo 7/22 EF 30-35% (2021)  This admit -> 25-30%. - NE stopped 8/12, then resumed briefly the morning of 8/15 -- later weaned off.  - CVVHD stopped 8/13 -> iHD. CVVHD restarted on 8/25 to get fluid down. - Unable to tolerate GDMT due to hypotension and AKI - Continue midodrine 15 mg TID  - Remains on DBA 2.5  -GOC discussions have been ongoing. Last night she elected to transition to comfort care.  - CVVHD stopped. Dobutamine has been cut back to 2.5 cmg.  - May need dose of lasix but will hold off and discuss with Dr Haroldine Laws.   - Plan to stop dobutamine after she meets with an attorney.    2. AFL with RVR - Developed shock and had urgent cardioversion 8/1 with conversion to NSR w/ ERAF - Repeat DCCV 8/4 w/ ERAF. Converted to sinus tachycardia on 8/10 with IV amio - Afib again starting 8/17, DCCV on 8/19.  - Back in AF overnight 8/25. Converted on amio - Seen by EP. Felt not to be a candidate for ablation. Not candidate for AVN ablation and  CRT due to need for HD and risk of device infection -In Sinus Tach today. Off heparin and amio drip.   3. AKI on CKD Stage IV -> ESRD - Baseline SCr ~3.2, AKI on admission to 7.33 - Renal US with no acute findings - CVVHD restarted on 12/15/20 to remove fluid - CVVHD stopped 8/28 per patient request and transition to comfort care.   4. Gout - Uric acid of 10. Pain RLE that was a questionable flare.  Now resolved  - On uloric 40 mg daily  - Completed 3 day course of prednisone    5. Hypokalemia -- now resolved - Labs stopped with transition to DNR>comfort    6. Lupus - on low-dose prednisone.  7. Thrombocytopenia - Plt count of 121 -> 104 -- >62 switched from heparin drip to bival, but negative for HIT -- heparin resumed on 8/24  Labs stopped with transition to DNR>comfort   8. Hypervolemic Hyponatremia - improving w/ HD - Na 134 today  9. DNR Last night she transitioned to comfort care.   Discussed with Dr Renella Cunas at bedside. Now on comfort care. Plan to stop dobutamine later today. Once dobutamine stopped will move to 6 N.   Darrick Grinder, NP  7:50 AM 12/19/2020  Patient seen and examined with the above-signed Advanced Practice Provider and/or Housestaff. I personally reviewed laboratory data, imaging studies and relevant notes. I independently examined the patient and formulated the important aspects of the plan. I have edited the note to reflect any of my changes or salient points. I have personally discussed the plan with the patient and/or family.  CVVHD  stopped last night. Catheter out. Now desires to switch to comfort care. Remains on DAB 2.5. Denies SOB, orthopnea or PND.   Son still struggling with the situation (understandably) and asking many reasonable questions about options.   General:  Sitting in chair No resp difficulty HEENT: normal Neck: supple. JVP 12. Carotids 2+ bilat; no bruits. No lymphadenopathy or thryomegaly appreciated. Cor: PMI nondisplaced. Regular tachy. No rubs, gallops or murmurs. Lungs: clear Abdomen: obese soft, nontender, nondistended. No hepatosplenomegaly. No bruits or masses. Good bowel sounds. Extremities: no cyanosis, clubbing, rash, 1-2+ edema Neuro: alert & orientedx3, cranial nerves grossly intact. moves all 4 extremities w/o difficulty. Affect pleasant  She remains in cardiogenic shock despite DBA support. Has failed to transition to iHD. Discussed with Renal and we agree that only option is comfort care. Discussed with patient and family about residential hospice versus transferring to [facility]. It appears they will have a bed for her at [facility] today. Will cotninue DBA until d/c.   Glori Bickers, MD  11:42 AM

## 2020-12-19 NOTE — Social Work (Signed)
EDCSW received call requesting assistance in cancelling PTAR for Pt for this evening. PTAR called ride for tonight cancelled.

## 2020-12-19 NOTE — Plan of Care (Signed)
  Problem: Education: Goal: Ability to demonstrate management of disease process will improve Outcome: Progressing Goal: Ability to verbalize understanding of medication therapies will improve Outcome: Progressing   Problem: Activity: Goal: Capacity to carry out activities will improve Outcome: Progressing   Problem: Cardiac: Goal: Ability to achieve and maintain adequate cardiopulmonary perfusion will improve Outcome: Progressing   Problem: Education: Goal: Knowledge of General Education information will improve Description: Including pain rating scale, medication(s)/side effects and non-pharmacologic comfort measures Outcome: Progressing   Problem: Health Behavior/Discharge Planning: Goal: Ability to manage health-related needs will improve Outcome: Progressing   Problem: Activity: Goal: Risk for activity intolerance will decrease Outcome: Progressing   Problem: Pain Managment: Goal: General experience of comfort will improve Outcome: Progressing   Problem: Elimination: Goal: Will not experience complications related to bowel motility Outcome: Progressing Goal: Will not experience complications related to urinary retention Outcome: Progressing

## 2020-12-19 NOTE — TOC Initial Note (Signed)
Transition of Care [facility]) - Initial/Assessment Note    Patient Details  Name: Caitlyn Fuentes MRN: [id number] Date of Birth: [birth year]  Transition of Care [facility]) CM/SW Contact:    Erenest Rasher, RN Phone Number: [phone] 12/19/2020, 9:51 AM  Clinical Narrative:                 TOC CM spoke to pt, son and daughter at bedside. Offered choice for Residential Hospice, pt and family requested [facility]. Contacted [facility] rep, Bevely Palmer with new referral. Will review and give CM call back with confirmation. Attending updated.   Expected Discharge Plan: [place] Barriers to Discharge: Continued Medical Work up   Patient Goals and CMS Choice Patient states their goals for this hospitalization and ongoing recovery are:: patient in agreement for Residential Hospice CMS Medicare.gov Compare Post Acute Care list provided to:: Patient Choice offered to / list presented to : Patient, Adult Children  Expected Discharge Plan and Services Expected Discharge Plan: [place] In-house Referral: Clinical Social Work Discharge Planning Services: CM Consult Post Acute Care Choice: Hospice Living arrangements for the past 2 months: [place]                 DME Arranged: Walker rolling with seat DME Agency: [employer] Date DME Agency Contacted: 11/22/20 Time DME Agency Contacted: [phone] Representative spoke with at DME Agency: Melina Modena HH Arranged: PT, OT, RN [facility] Agency: [place] Date [place]: 11/22/20   Representative spoke with at [place]: Beryle Beams  Prior Living Arrangements/Services Living arrangements for the past 2 months: Single Family Home Lives with:: Spouse Patient language and need for interpreter reviewed:: Yes Do you feel safe going back to the place where you live?: Yes      Need for Family Participation in Patient Care: Yes (Comment) Care giver support system in place?: Yes  (comment) Current home services: DME Criminal Activity/Legal Involvement Pertinent to Current Situation/Hospitalization: No - Comment as needed  Activities of Daily Living Home Assistive Devices/Equipment: Cane (specify quad or straight) ADL Screening (condition at time of admission) Patient's cognitive ability adequate to safely complete daily activities?: Yes Is the patient deaf or have difficulty hearing?: No Does the patient have difficulty seeing, even when wearing glasses/contacts?: No Does the patient have difficulty concentrating, remembering, or making decisions?: No Patient able to express need for assistance with ADLs?: No Does the patient have difficulty dressing or bathing?: No Independently performs ADLs?: Yes (appropriate for developmental age) Does the patient have difficulty walking or climbing stairs?: Yes Weakness of Legs: None Weakness of Arms/Hands: None  Permission Sought/Granted Permission sought to share information with : Case Manager, PCP, Family Supports Permission granted to share information with : Yes, Verbal Permission Granted  Share Information with NAME: Phillis Haggis, [place] granted to share info w AGENCY: Home Health, DME, CIR, SNF's  Permission granted to share info w Relationship: daughter  Permission granted to share info w Contact Information: [phone]  Emotional Assessment Appearance:: Appears stated age Attitude/Demeanor/Rapport: Gracious, Engaged Affect (typically observed): Accepting Orientation: : Oriented to Place, Oriented to  Time, Oriented to Situation   Psych Involvement: No (comment)  Admission diagnosis:  Atrial fibrillation with rapid ventricular response (HCC) [I48.91] Atrial fibrillation with RVR (HCC) [I48.91] Acute renal failure superimposed on stage 4 chronic kidney disease, unspecified acute renal failure type ([place]) [N17.9, N18.4] CHF (congestive heart failure) ([place]) [I50.9] Patient Active Problem List    Diagnosis  Date Noted   Goals of care, counseling/discussion    Palliative care by specialist    Atrial fibrillation with rapid ventricular response ([place]) 11/20/2020   CHF (congestive heart failure) ([place]) 11/20/2020   CKD (chronic kidney disease) stage 4, GFR 15-29 ml/min ([place]) 10/16/2019   Acute renal failure superimposed on stage 4 chronic kidney disease ([place]) 10/16/2019   Acute on chronic systolic heart failure ([place]) 10/16/2019   Gout due to renal impairment 10/16/2019   Morbid obesity ([place]) 10/16/2019   Hypokalemia 10/15/2019   CKD (chronic kidney disease) stage 3, GFR 30-59 ml/min (HCC) 08/28/2016   Dizziness 08/17/2016   Acute hypokalemia 08/17/2016   Acute hyponatremia 08/17/2016   Chest tightness 08/17/2016   Near syncope 08/17/2016   Nonischemic cardiomyopathy ([place]) 05/25/2015   Chronic combined systolic and diastolic heart failure ([place]) 05/25/2015   Palpitations 05/25/2015   Gout 10/16/2013   Hyperlipidemia 10/16/2013   Long term current use of anticoagulant therapy 07/20/2013   Chest pain, atypical 08/16/2011   Morbid obesity ([place]) 08/16/2011   Bilateral lower extremity edema 08/16/2011   Obstructive sleep apnea on CPAP 08/16/2011   Paroxysmal atrial fibrillation ([place]) 08/16/2011   PCP:  Guadlupe Spanish, MD Pharmacy:   [id number] [place], [place] - [address]OSS [address] [place] [address] Phone: [phone] Fax: [phone]     Social Determinants of Health (SDOH) Interventions Food Insecurity Interventions: Assist with SNAP Application Financial Strain Interventions: Other (Comment) (Referral to [facility]) Housing Interventions: Intervention Not Indicated Transportation Interventions: Intervention Not Indicated  Readmission Risk Interventions No flowsheet data found.

## 2020-12-19 NOTE — Progress Notes (Signed)
Patient ID: Caitlyn Fuentes, female   DOB: 03-19-1956, 65 y.o.   MRN: [id number]    Progress Note from the Palliative Medicine Team at [facility]   Patient Name: Caitlyn Fuentes        Date: 12/19/2020 DOB: 05-Nov-1955  Age: 65 y.o. MRN#: [id number] Attending Physician: Jolaine Artist, MD Primary Care Physician: Guadlupe Spanish, MD Admit Date: 11/20/2020   Medical records reviewed   65 y.o. female  with past medical history of PAF on systemic anticoagulation, chronic systolic CHF secondary to nonischemic cardiomyopathy, CKD stage IV, SLE on chronic steroids, HTN, COPD, pulmonary hypertension, gout, hypothyroidism admitted on 11/20/2020 with worsening of palpitations and shortness of breath and leg swelling.   Patient was found to have afib with RVR, acute on chronic systolic heart failure, cardiogenic shock, and AKI due to cardiorenal syndrome requiring CRRT. Not a candidate for advanced HF therapies or ablation.   Palliative medicine consulted to assist with goals of care conversation.  Continued decline,   cardiogenic shock as well as renal failure and spite of full medical support.   Not a candidate for out patient  HD.    Large family gathered yesterday  to discuss  current medical situation.  Dr. Haroldine Laws updated patient and family on the seriousness of the current medical situation.  Questions and concerns were addressed.  Patient and family verbalized an understanding that patient would likely continue to decline and spite of full medical support and that likely CRRT would likely declare itself and need to be discontinued secondary to patient's other comorbidities and inability to tolerate dialysis.  Last night further complications affected the ability to continue CRRT and decision to discontinue CRRT was made at this time, shifting to a more comfort path.  This nurse practitioner met with patient and large family again today at the bedside. Patient is verbalizing an  understanding of her limited prognosis and her decision to shift to comfort and dignity at this known time of end-of-life.   Education offered regarding the difference between aggressive medical intervention path and a palliative comfort path for this patient at this time in this situation.  Education offered on the natural trajectory and expectations at end-of-life.  Education offered on symptom management strategies to enhance comfort and dignity at end-of-life.   Education offered on residential hospice benefit, [facility] (patient volunteered at [place] in the [place]) and a referral has already been placed.   Created space and opportunity for family to explore the thoughts and feelings regarding current medical situation.  All family members are supportive of [name]'s decision for comfort at this time.  All gathered to support not just the patient but each other.  Patient again communicate to her family her sense of peace and acceptance regarding her current medical situation and her hope for comfort and dignity.     Plan of Care: -DNR/DNI -no further dialysis  -Continue current medications until discharge -residential hospice for EOL care/ prognosis is likely days to week  -PMT will continue to support holistically   Questions and concerns addressed   Discussed with bedside RN and hospice liaison   Total time spent on the unit was 35 minutes  Greater than 50% of the time was spent in counseling and coordination of care  Wadie Lessen NP  Palliative Medicine Team Team Phone # [phone] Pager [phone]

## 2020-12-19 NOTE — TOC CM/SW Note (Signed)
HF TOC CM spoke to [facility] rep, Bevely Palmer. Pt has been approved for [facility]. She is attempting to contact dtr to get consent forms completed. States they would like for pt to arrive after 9 pm tonight. Will arrange PTAR pick up for 8:30 pm. Attending updated. [place], Heart Failure TOC CM [phone]

## 2020-12-19 NOTE — TOC Transition Note (Addendum)
Transition of Care [facility]) - CM/SW Discharge Note   Patient Details  Name: Caitlyn Fuentes MRN: [id number] Date of Birth: September 29, 1955  Transition of Care [facility]) CM/SW Contact:  Josephine, [place] Phone Number: 12/19/2020, 4:15 PM   Clinical Narrative:    Patient will DC to: [place] Anticipated DC date: 12/19/2020 Transport by: Corey Harold   Per MD patient ready for DC to [facility]. RN to call report prior to discharge [phone]). RN, patient, patient's family, and hospice facility notified of DC. DC packet on chart. Ambulance transport requested for patient at 8:30pm.   CSW will sign off for now as social work intervention is no longer needed. Please consult [place] again if new needs arise.     Final next level of care: [place] Barriers to Discharge: No Barriers Identified   Patient Goals and CMS Choice Patient states their goals for this hospitalization and ongoing recovery are:: patient in agreement for Residential Hospice CMS Medicare.gov Compare Post Acute Care list provided to:: Patient Choice offered to / list presented to : Patient, Adult Children  Discharge Placement                Patient to be transferred to facility by: Ptar   Patient and family notified of of transfer: 12/19/20  Discharge Plan and Services In-house Referral: Clinical Social Work Discharge Planning Services: [place] Acute Care Choice: Hospice          DME Arranged: Gilford Rile rolling with seat DME Agency: [employer] Date DME Agency Contacted: 11/22/20 Time DME Agency Contacted: [phone] Representative spoke with at DME Agency: Melina Modena HH Arranged: PT, OT, RN [facility] Agency: [place] Date [place]: 11/22/20   Representative spoke with at [place]: Beryle Beams  Social Determinants of Health (SDOH) Interventions Food Insecurity Interventions: Assist with SNAP Application Financial Strain Interventions: Other (Comment) (Referral to [facility]) Housing Interventions: Intervention Not Indicated Transportation Interventions: Intervention Not Indicated   Readmission Risk Interventions No flowsheet data found.   Minha Fulco, MSW, [place] Heart Failure Social Worker

## 2020-12-19 NOTE — Progress Notes (Signed)
DNR is on the front of the patients chart, and cardiology team signed for discharge with hospice.  Caitlyn Fuentes, MSW, [place] Heart Failure Social Worker

## 2020-12-19 NOTE — Progress Notes (Signed)
[facility] ([facility]) Hospital Liaison note.     This patient is approved to transfer to [facility] today.    [facility] will notify TOC when registration paperwork has been completed to arrange transport.    RN please call report to [phone].   Thank you,     Mary Anne Robertson, RN, CCM       [facility] Hospital Liaison  [phone] 

## 2020-12-19 NOTE — Progress Notes (Signed)
RN made family aware that the next step in the comfort care transition would be to d/c the dobutamine. Pt's family said they were not ready to d/c this medication at this time and would like for her to stay on this infusion throughout the night and talk in the morning around 0800 about d/c this drip. Pt and family share the concerns of needing to finish talking with the attorney in regards to patients POA/Will's/Finances.   RN notified Dr Renella Cunas about this concern and RN was given orders to keep dobutamine on at 2.76mcg throughout the night and will talk with Dr Haroldine Laws in the AM. NAD noted by patient.

## 2020-12-19 NOTE — Progress Notes (Signed)
After shift change [place] realize that there was a error in the circuit resulting in recurrent alarms on CVVH.  After examining the Vas-Cath it appears that there is likely a fracture in the catheter versus additional air entraining around the insertion site.  After long discussion with Caitlyn Fuentes and her daughter at bedside we recommended permanent discontinuation of dialysis and focusing on her comfort as opposed to life-prolonging measures.  Her and her daughter were in agreement with this and we allowed her family to visit with her throughout the night.  All Comfort related medications were discontinued except dobutamine which was reduced from 5 mcg to 2.5 mcg/kg/min while family was visiting.  The plan was to permanently discontinue dobutamine this morning given her goals of care however her son became very concerned about this along with one of the other daughters which has created a very difficult situation for Caitlyn Fuentes.  She told nursing staff that she now needs more time to finalize her power of attorney, will and finances.  Family has gone home for the time being.  We can rediscuss this in the morning once they return with the patient and her family.  We need to be off of dobutamine if she wants to transition to inpatient hospice depending on her clinical status today.

## 2020-12-19 NOTE — Progress Notes (Signed)
Patient's family verbalizes need for patient to be awake and oriented to sign important paperwork tomorrow.  Dr. Hilma Favors notified and spoke with family at length, decision made to cancel discharge and keep patient on 2H.  Jonelle Sidle, PA notified from Cardiology.

## 2020-12-20 DIAGNOSIS — N170 Acute kidney failure with tubular necrosis: Secondary | ICD-10-CM | POA: Diagnosis not present

## 2020-12-20 DIAGNOSIS — R570 Cardiogenic shock: Secondary | ICD-10-CM | POA: Diagnosis not present

## 2020-12-20 DIAGNOSIS — N184 Chronic kidney disease, stage 4 (severe): Secondary | ICD-10-CM | POA: Diagnosis not present

## 2020-12-20 MED ORDER — DOBUTAMINE IN D5W 4-5 MG/ML-% IV SOLN
2.5000 ug/kg/min | INTRAVENOUS | Status: DC
  Administered 2020-12-20: 2.5 ug/kg/min via INTRAVENOUS
  Filled 2020-12-20: qty 250

## 2020-12-20 MED ORDER — DOBUTAMINE IN D5W 4-5 MG/ML-% IV SOLN: 2.5000 ug/kg/min | INTRAVENOUS | Status: AC

## 2020-12-20 NOTE — Progress Notes (Signed)
Manufacturing [employer] [facility]) Hospital Liaison note.    [place] is holding the bed for Mrs. Yohe.  One consent will need to be done again with the correct date of transfer.  [facility] SW to contact family to complete this.  TOC aware.    [facility] will notify TOC when registration paperwork has been completed to arrange transport.   RN please call report to [phone].  Please be sure the signed [facility] DNR transports with the patient.  Thank you for the opportunity to participate in this patient's care.  Domenic Moras, BSN, RN [facility] Liaison (listed on [place] under Hospice/[facility])    [phone] (24h on call)

## 2020-12-20 NOTE — TOC Transition Note (Signed)
Transition of Care [facility]) - CM/SW Discharge Note Heart Failure   Patient Details  Name: Caitlyn Fuentes MRN: [id number] Date of Birth: 18-May-1955  Transition of Care [facility]) CM/SW Contact:  Caitlyn Fuentes, Caitlyn Fuentes Phone Number: 12/20/2020, 1:28 PM   Clinical Narrative:    Patient will DC to: [place] Anticipated DC date: 12/20/2020 Transport by: Caitlyn Fuentes   Per MD patient ready for DC to [facility]. RN already called report prior to discharge [phone] RN, patient, patient's family, and hospice facility notified of DC. DC packet on chart. Ambulance transport requested for patient as soon as possible due to patient's residential hospice status.   CSW will sign off for now as social work intervention is no longer needed. Please consult [place] again if new needs arise.   Final next level of care: [place] Barriers to Discharge: No Barriers Identified   Patient Goals and CMS Choice Patient states their goals for this hospitalization and ongoing recovery are:: patient in agreement for Residential Hospice CMS Medicare.gov Compare Post Acute Care list provided to:: Patient Choice offered to / list presented to : Patient, Adult Children  Discharge Placement                Patient to be transferred to facility by: Ptar   Patient and family notified of of transfer: 12/19/20  Discharge Plan and Services In-house Referral: Clinical Social Work Discharge Planning Services: [place] Acute Care Choice: Hospice          DME Arranged: Gilford Rile rolling with seat DME Agency: [employer] Date DME Agency Contacted: 11/22/20 Time DME Agency Contacted: [phone] Representative spoke with at DME Agency: Caitlyn Fuentes HH Arranged: PT, OT, RN [facility] Agency: [place] Date [place]: 11/22/20   Representative spoke with at [place]: Caitlyn Fuentes  Social Determinants of Health (SDOH) Interventions Food Insecurity Interventions: Assist with SNAP  Application Financial Strain Interventions: Other (Comment) (Referral to [facility]) Housing Interventions: Intervention Not Indicated Transportation Interventions: Intervention Not Indicated   Readmission Risk Interventions No flowsheet data found.   Caitlyn Fuentes, MSW, [place] Heart Failure Social Worker

## 2020-12-20 NOTE — Progress Notes (Signed)
Pt picked up by EMS at this time for transport to [facility]. All hospital equipment removed. Dobutamine gtt stopped and central line removed per verbal order by Thurnell Lose NP at this time. All belongings gathered and sent home with family. No other needs/questions identified at this time.

## 2020-12-20 NOTE — Progress Notes (Signed)
Patient ID: Caitlyn Fuentes, female   DOB: May 02, 1955, 65 y.o.   MRN: [id number]     Advanced Heart Failure Rounding Note  PCP-Cardiologist: Shelva Majestic, MD  HF clinic: Dr. Haroldine Laws  Subjective:    Remains on DBA 2.5. Developed SOB this am and received ativan. Back in AF with RVR  Objective:   Weight Range: 119.9 kg Body mass index is 46.09 kg/m.   Vital Signs:   Pulse Rate:  [31-117] 42 (08/29 2200) Resp:  [13-35] 24 (08/30 0500) BP: (98-146)/(53-97) 98/83 (08/29 1600) SpO2:  [89 %-100 %] 95 % (08/30 0400) FiO2 (%):  [50 %] 50 % (08/29 0825) Last BM Date: 12/19/20  Weight change: Filed Weights   12/15/20 1145 12/15/20 1530 12/16/20 0600  Weight: 120.8 kg 121.6 kg 119.9 kg    Intake/Output:   Intake/Output Summary (Last 24 hours) at 12/20/2020 0649 Last data filed at 12/20/2020 0400 Gross per 24 hour  Intake 530.69 ml  Output 300 ml  Net 230.69 ml      Physical Exam   General:  In bed sedated HEENT: normal Neck: supple. JVP to ear  Carotids 2+ bilat; no bruits. No lymphadenopathy or thryomegaly appreciated. Cor: PMI nondisplaced. Irregular tachy No rubs, gallops or murmurs. Lungs: clear Abdomen: soft, nontender, nondistended. No hepatosplenomegaly. No bruits or masses. Good bowel sounds. Extremities: no cyanosis, clubbing, rash, 2+ edema Neuro: sedated  Telemetry  Sinus Tach 130s Personally reviewed   Labs    CBC Recent Labs    12/17/20 2207 12/18/20 0342  WBC  --  17.4*  HGB 10.5* 7.9*  HCT 31.0* 25.2*  MCV  --  101.6*  PLT  --  145*    Basic Metabolic Panel Recent Labs    12/17/20 2158 12/17/20 2207 12/18/20 0342  NA 134* 134* 134*  K 5.1 5.1 5.0  CL 95*  --  93*  CO2 28  --  25  GLUCOSE 136*  --  114*  BUN 12  --  12  CREATININE 2.50*  --  2.61*  CALCIUM 8.8*  --  8.8*  MG 1.9  --  1.9  PHOS  --   --  2.6    Liver Function Tests Recent Labs    12/18/20 0342  ALBUMIN 2.6*     No results for input(s): LIPASE, AMYLASE in  the last 72 hours. Cardiac Enzymes No results for input(s): CKTOTAL, CKMB, CKMBINDEX, TROPONINI in the last 72 hours.  BNP: BNP (last 3 results) Recent Labs    11/20/20 0318 11/21/20 0258  BNP 2,051.3* 2,022.4*     ProBNP (last 3 results) No results for input(s): PROBNP in the last 8760 hours.   D-Dimer No results for input(s): DDIMER in the last 72 hours. Hemoglobin A1C No results for input(s): HGBA1C in the last 72 hours.  Fasting Lipid Panel No results for input(s): CHOL, HDL, LDLCALC, TRIG, CHOLHDL, LDLDIRECT in the last 72 hours. Thyroid Function Tests No results for input(s): TSH, T4TOTAL, T3FREE, THYROIDAB in the last 72 hours.  Invalid input(s): FREET3   Other results:   Imaging    No results found.   Medications:     Scheduled Medications:  (feeding supplement) PROSource Plus  30 mL Oral BID BM   gabapentin  100 mg Oral Daily   lidocaine  1 patch Transdermal Q24H   midodrine  15 mg Oral TID WC   predniSONE  5 mg Oral Q breakfast   senna-docusate  2 tablet Oral BID   torsemide  100 mg Oral BID    Infusions:  DOBUTamine 2.5 mcg/kg/min (12/20/20 2957)   morphine      PRN Medications: acetaminophen **OR** acetaminophen, albuterol, glycopyrrolate **OR** glycopyrrolate **OR** glycopyrrolate, haloperidol **OR** haloperidol **OR** haloperidol lactate, HYDROcodone-acetaminophen, lidocaine-prilocaine, lidocaine-prilocaine, LORazepam, morphine, ondansetron (ZOFRAN) IV, ondansetron **OR** ondansetron (ZOFRAN) IV, oxyCODONE, pentafluoroprop-tetrafluoroeth, polyvinyl alcohol, prochlorperazine     Assessment/Plan   1. Acute/Chronic Systolic Heart Failure -> Cardiogenic shock in setting AF with RVR - she has failed aggressive support - now on comfort care - remains on DBA 2.5 until she can transfer based on family's request - Daughter confirms that she is eager to go to [facility] today after papers signed this am. 6N also discussed as an option  -  can start morphine gtt as needed   2. AFL with RVR - Back in AF with RVR - Failed multiple DC-CVs  3. 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   4. DNR/DNI - now on comfort care - disposition as above   Glori Bickers, MD  6:49 AM 12/20/2020

## 2020-12-20 NOTE — Progress Notes (Signed)
Report given/received by Tiajuana Amass RN at [facility]. All questions answered. No other needs identified at this time.

## 2020-12-20 NOTE — TOC CM/SW Note (Signed)
HF TOC CM spoke to dtr at bedside. States they are waiting on pt's attorney to complete her last will and testament, states they want to make sure her wishes are followed out. Attorney planned meeting with pt and family is today at 3 noon. Will transport to [facility] via [place] after meeting with attorney. Updated attending. [place], Heart Failure TOC CM [phone]

## 2021-01-21 DEATH — deceased

## 2021-02-09 ENCOUNTER — Other Ambulatory Visit: Payer: Self-pay | Admitting: Cardiovascular Disease

## 2021-02-09 NOTE — Telephone Encounter (Signed)
Pt qualifies for a dose decrease routing to pharmd pool for review.  Prescription refill request for Xarelto received.  Indication:afib Last office visit:kelly 03/09/20 Weight:112.9kg Age:89f Scr:2.61 12/18/20 CrCl:38.8

## 2021-02-09 NOTE — Telephone Encounter (Signed)
Pt no longer on Xarelto, declining refill

## 2022-09-13 IMAGING — US US RENAL
1 series · 14 of 25 positions shown · non-contrast
Comparison: Renal ultrasound 10/29/2019

CLINICAL DATA: AK I

EXAM:
RENAL / URINARY TRACT ULTRASOUND COMPLETE

[Series 1: us renal · 14 of 26 slices shown]
[im 1/26]
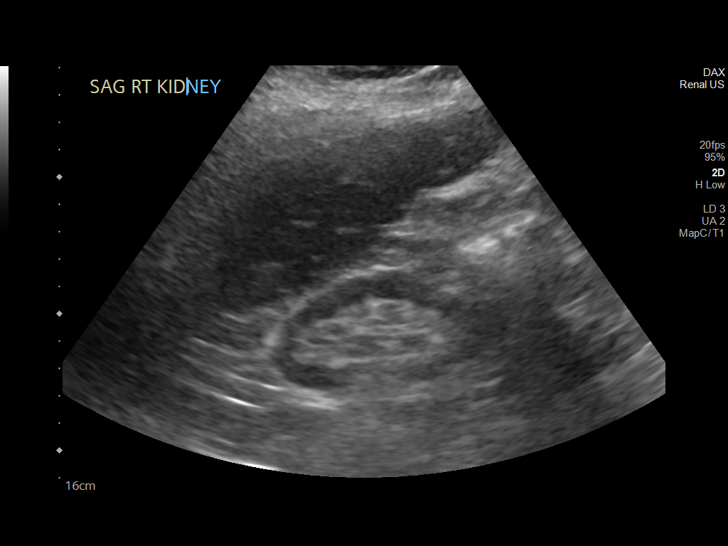
[im 3/26]
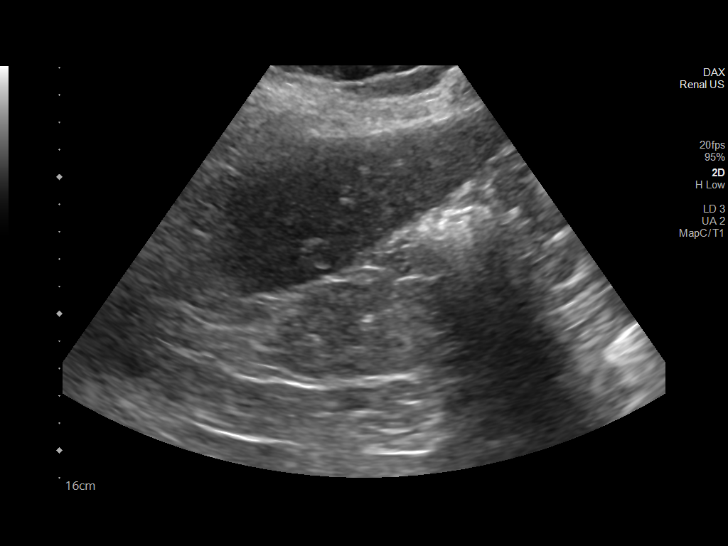
[im 5/26]
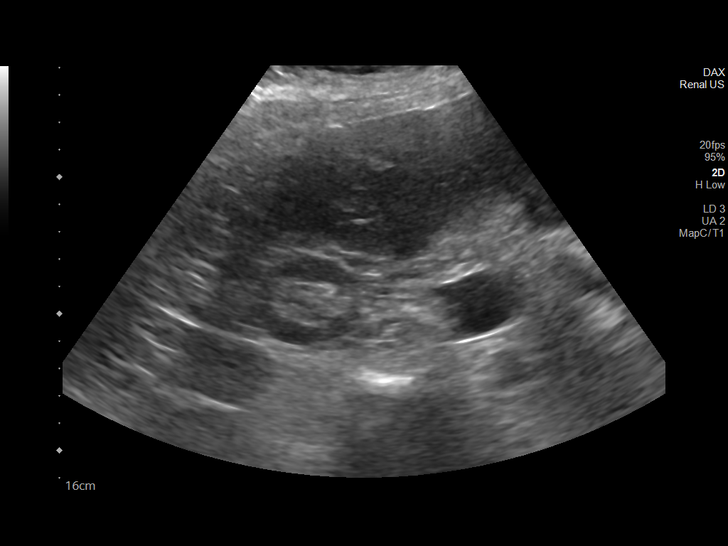
[im 7/26]
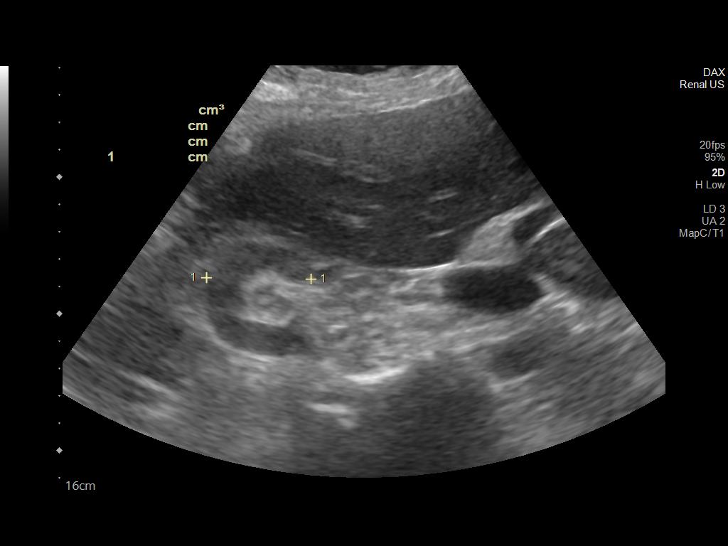
[im 9/26]
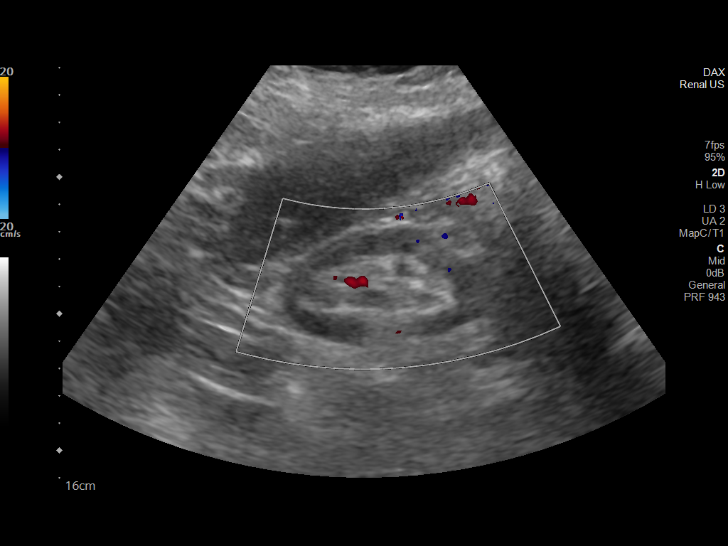
[im 10/26]
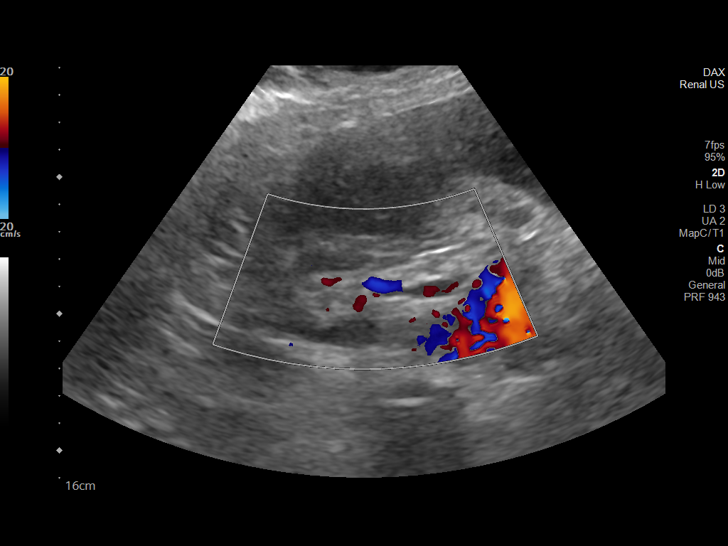
[im 12/26]
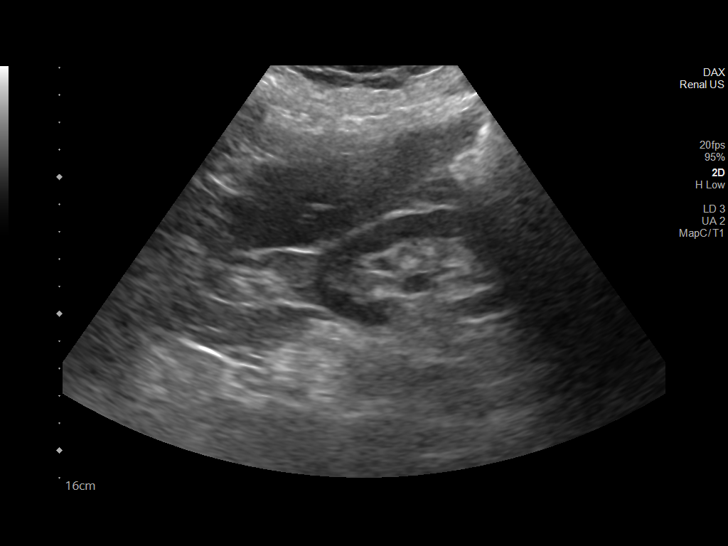
[im 14/26]
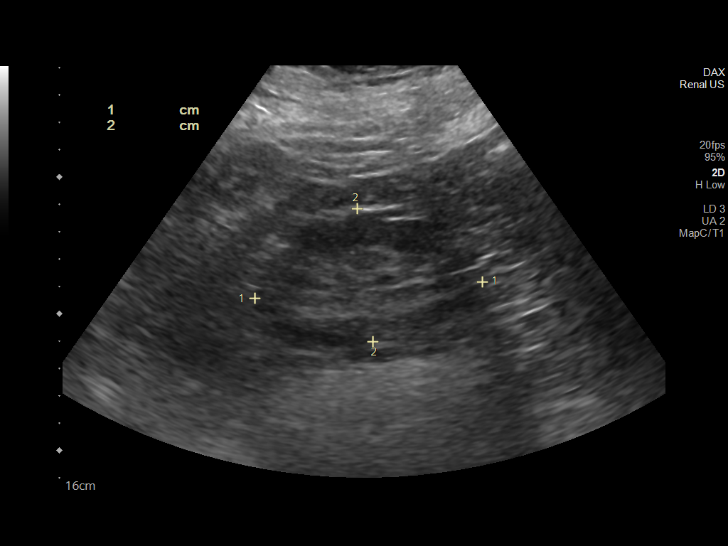
[im 16/26]
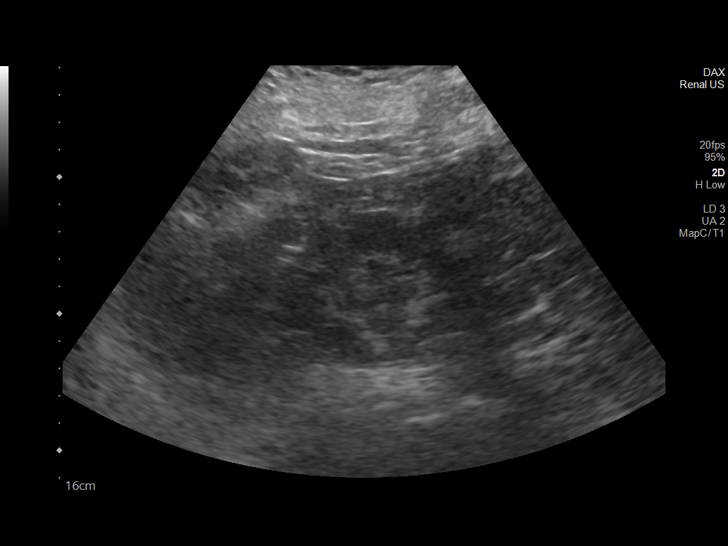
[im 17/26]
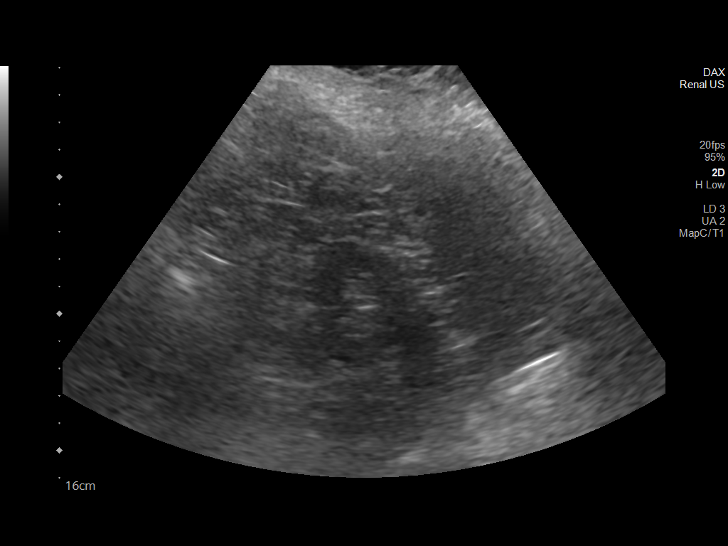
[im 19/26]
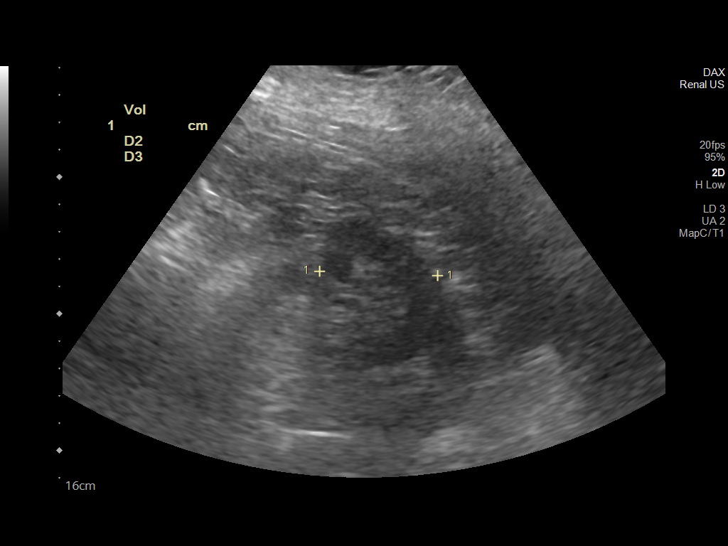
[im 21/26]
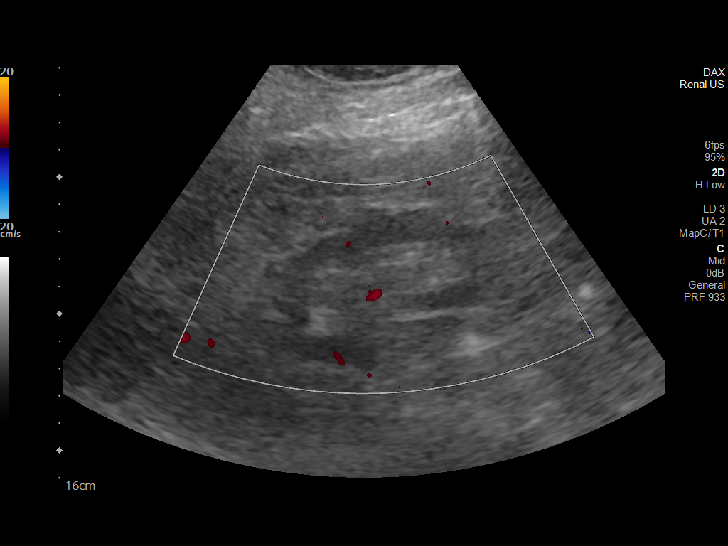
[im 23/26]
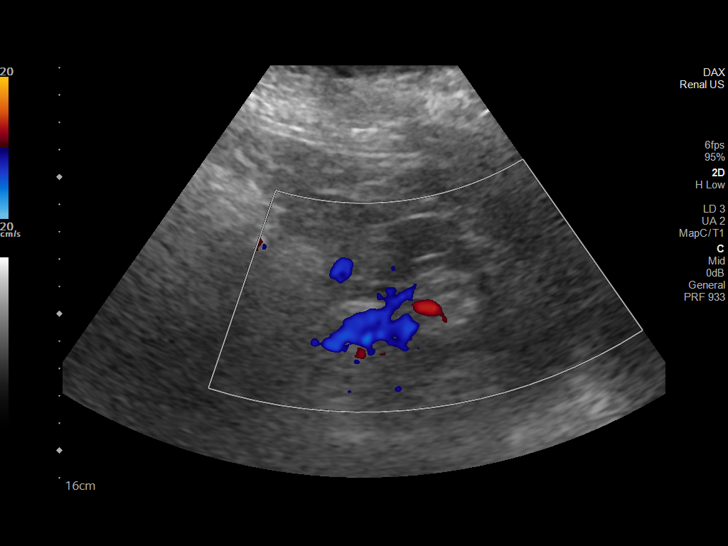
[im 26/26]
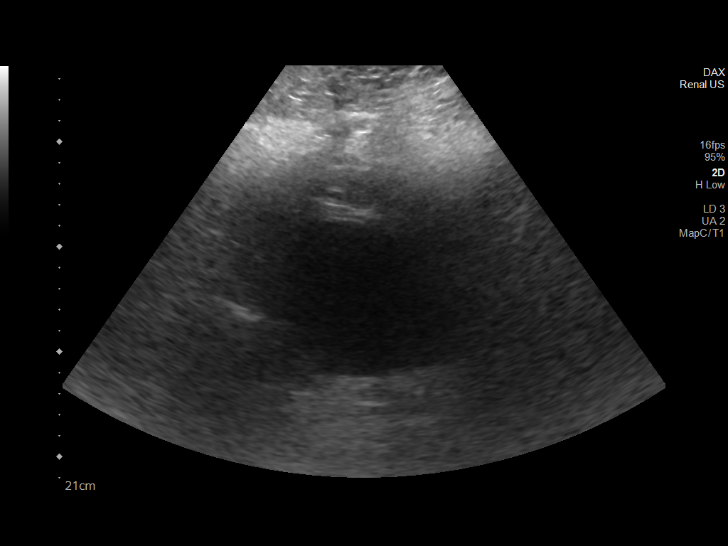

[14 of 25 positions shown; findings below may reference images not displayed]

FINDINGS: Right Kidney:

Renal measurements: 8.7 x 3.8 x 3.8 cm = volume: 67 mL. No
hydronephrosis. No definite mass visualized. Cortical thinning.

Left Kidney:

Renal measurements: 8.3 x 4.9 x 4.3 cm = volume: 92 mL. No
hydronephrosis. No definite mass visualized. Cortical thinning.

Bladder:

Appears normal for degree of bladder distention.

Other:

None.
IMPRESSION: Limited study due to patient body habitus. No definite acute
finding. Bilateral cortical thinning.
